# Patient Record
Sex: Female | Born: 1955
Health system: Southern US, Academic
[De-identification: ages and names within clinical notes are randomized; demographics above are authoritative.]

## PROBLEM LIST (undated history)

## (undated) ENCOUNTER — Telehealth

## (undated) ENCOUNTER — Encounter

## (undated) DIAGNOSIS — C801 Malignant (primary) neoplasm, unspecified: Secondary | ICD-10-CM

## (undated) DIAGNOSIS — K219 Gastro-esophageal reflux disease without esophagitis: Secondary | ICD-10-CM

## (undated) DIAGNOSIS — E559 Vitamin D deficiency, unspecified: Secondary | ICD-10-CM

## (undated) DIAGNOSIS — E785 Hyperlipidemia, unspecified: Secondary | ICD-10-CM

## (undated) DIAGNOSIS — K7689 Other specified diseases of liver: Secondary | ICD-10-CM

## (undated) DIAGNOSIS — E538 Deficiency of other specified B group vitamins: Secondary | ICD-10-CM

## (undated) DIAGNOSIS — K760 Fatty (change of) liver, not elsewhere classified: Secondary | ICD-10-CM

## (undated) DIAGNOSIS — K5792 Diverticulitis of intestine, part unspecified, without perforation or abscess without bleeding: Secondary | ICD-10-CM

## (undated) DIAGNOSIS — F32A Depression, unspecified: Secondary | ICD-10-CM

## (undated) DIAGNOSIS — I10 Essential (primary) hypertension: Secondary | ICD-10-CM

## (undated) DIAGNOSIS — F329 Major depressive disorder, single episode, unspecified: Secondary | ICD-10-CM

## (undated) HISTORY — DX: Vitamin D deficiency, unspecified: E55.9

## (undated) HISTORY — DX: Hyperlipidemia, unspecified: E78.5

## (undated) HISTORY — PX: ABDOMINAL HYSTERECTOMY: SHX81

## (undated) HISTORY — DX: Depression, unspecified: F32.A

## (undated) HISTORY — DX: Fatty (change of) liver, not elsewhere classified: K76.0

## (undated) HISTORY — DX: Gastro-esophageal reflux disease without esophagitis: K21.9

## (undated) HISTORY — DX: Deficiency of other specified B group vitamins: E53.8

## (undated) HISTORY — DX: Other specified diseases of liver: K76.89

## (undated) HISTORY — PX: COLON RESECTION: SHX5231

## (undated) HISTORY — DX: Diverticulitis of intestine, part unspecified, without perforation or abscess without bleeding: K57.92

## (undated) HISTORY — DX: Malignant (primary) neoplasm, unspecified: C80.1

## (undated) HISTORY — DX: Essential (primary) hypertension: I10

---
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
## 2016-01-14 DIAGNOSIS — G2581 Restless legs syndrome: Principal | ICD-10-CM

## 2016-01-14 MED ORDER — ROPINIROLE HCL 2 MG PO TABS
2 mg | Freq: Every evening | ORAL | 2 refills | Status: CP
Start: 2016-01-14 — End: 2016-05-24

## 2016-01-14 NOTE — Telephone Encounter
From: Trilby DrummerBetty A Duffey  To: Silvestre MesiJohns, Garri, ARNP  Sent: 01/14/2016 5:49 AM EDT  Subject:  Medication Renewal Request    Original  authorizing provider: Silvestre MesiGarri Johns, ARNP    Tina GaudyBetty  A. Maisie Key would like a refill of the following medications:  rOPINIRole  (REQUIP) 2 MG Tablet Silvestre Mesi[Garri Johns, [name]    [facility]: [name], [name]    Comment:

## 2016-01-26 DIAGNOSIS — I10 Essential (primary) hypertension: Principal | ICD-10-CM

## 2016-01-26 DIAGNOSIS — F330 Major depressive disorder, recurrent, mild: Principal | ICD-10-CM

## 2016-01-26 MED ORDER — HYDROCHLOROTHIAZIDE 12.5 MG PO TABS
2 refills | Status: CP
Start: 2016-01-26 — End: 2016-02-23

## 2016-01-26 MED ORDER — QUETIAPINE FUMARATE 25 MG PO TABS
2 refills | Status: CP
Start: 2016-01-26 — End: 2016-02-23

## 2016-02-23 DIAGNOSIS — F411 Generalized anxiety disorder: Principal | ICD-10-CM

## 2016-02-23 DIAGNOSIS — F330 Major depressive disorder, recurrent, mild: Principal | ICD-10-CM

## 2016-02-23 DIAGNOSIS — I10 Essential (primary) hypertension: Secondary | ICD-10-CM

## 2016-02-23 MED ORDER — DIAZEPAM 2 MG PO TABS
2 mg | Freq: Two times a day (BID) | ORAL | 0 refills | Status: CN | PRN
Start: 2016-02-23 — End: ?

## 2016-02-23 MED ORDER — QUETIAPINE FUMARATE 25 MG PO TABS
25 mg | Freq: Two times a day (BID) | ORAL | 2 refills | Status: CP
Start: 2016-02-23 — End: 2016-03-15

## 2016-02-23 MED ORDER — HYDROCHLOROTHIAZIDE 12.5 MG PO TABS
12.5 mg | Freq: Every day | ORAL | 1 refills | Status: CP
Start: 2016-02-23 — End: 2016-10-24

## 2016-02-23 NOTE — Telephone Encounter
Meds sent in but needs to schedule office visit hasn't been seen since 09/2015.  Thanks,  Arvil ChacoGarri

## 2016-02-23 NOTE — Telephone Encounter
From: Trilby DrummerBetty A Spacek  To: Silvestre MesiJohns, Garri, [name]  Sent: 02/22/2016 8:18 PM EDT  Subject:  Medication Renewal Request    Original  authorizing provider: Silvestre MesiGarri Johns, ARNP    [name] would like a refill of the following medications:  QUEtiapine  (SEROquel) 25 MG Tablet Silvestre Mesi[Garri Johns, [name]    Preferred  [facility]acy: [name], [name]    Comment:      Medication  renewals requested in this message routed to other providers:  diazePAM  (VALIUM) 2 MG Tablet [name], [name]  hydroCHLOROthiazide  (HYDRODIURIL) 12.5 MG Tablet [name], [name]

## 2016-02-23 NOTE — Telephone Encounter
SENT MYCHART MESSAGE.

## 2016-02-23 NOTE — Telephone Encounter
From: Trilby DrummerBetty A Key  To: [name], [name], [name]  Sent: 02/22/2016 8:18 PM EDT  Subject:  Medication Renewal Request    Original  authorizing provider: [name], ARNP    [name] would like a refill of the following medications:  diazePAM  (VALIUM) 2 MG Tablet [name], [name]  hydroCHLOROthiazide  (HYDRODIURIL) 12.5 MG Tablet [name], [name]    Preferred  [facility]acy: [name], [name]    Comment:      Medication  renewals requested in this message routed to other providers:  QUEtiapine  (SEROquel) 25 MG Tablet [name], [name]

## 2016-02-23 NOTE — Telephone Encounter
Has to have appt for valium    HCTZ sent

## 2016-02-23 NOTE — Telephone Encounter
Pt read mychart message

## 2016-02-24 NOTE — Telephone Encounter
Replied in mychart

## 2016-02-24 NOTE — Telephone Encounter
Pt Read message 02/24/16 1:50pm

## 2016-03-15 ENCOUNTER — Ambulatory Visit: Attending: Family | Primary: Nurse Practitioner

## 2016-03-15 DIAGNOSIS — Z8541 Personal history of malignant neoplasm of cervix uteri: Secondary | ICD-10-CM

## 2016-03-15 DIAGNOSIS — G4700 Insomnia, unspecified: Secondary | ICD-10-CM

## 2016-03-15 DIAGNOSIS — G8929 Other chronic pain: Secondary | ICD-10-CM

## 2016-03-15 DIAGNOSIS — F329 Major depressive disorder, single episode, unspecified: Secondary | ICD-10-CM

## 2016-03-15 DIAGNOSIS — F330 Major depressive disorder, recurrent, mild: Secondary | ICD-10-CM

## 2016-03-15 DIAGNOSIS — K449 Diaphragmatic hernia without obstruction or gangrene: Secondary | ICD-10-CM

## 2016-03-15 DIAGNOSIS — D649 Anemia, unspecified: Secondary | ICD-10-CM

## 2016-03-15 DIAGNOSIS — I10 Essential (primary) hypertension: Secondary | ICD-10-CM

## 2016-03-15 DIAGNOSIS — I499 Cardiac arrhythmia, unspecified: Secondary | ICD-10-CM

## 2016-03-15 DIAGNOSIS — R32 Unspecified urinary incontinence: Secondary | ICD-10-CM

## 2016-03-15 DIAGNOSIS — E349 Endocrine disorder, unspecified: Secondary | ICD-10-CM

## 2016-03-15 DIAGNOSIS — F411 Generalized anxiety disorder: Secondary | ICD-10-CM

## 2016-03-15 DIAGNOSIS — K651 Peritoneal abscess: Secondary | ICD-10-CM

## 2016-03-15 DIAGNOSIS — J0111 Acute recurrent frontal sinusitis: Principal | ICD-10-CM

## 2016-03-15 DIAGNOSIS — E785 Hyperlipidemia, unspecified: Secondary | ICD-10-CM

## 2016-03-15 DIAGNOSIS — J449 Chronic obstructive pulmonary disease, unspecified: Secondary | ICD-10-CM

## 2016-03-15 DIAGNOSIS — Z6833 Body mass index (BMI) 33.0-33.9, adult: Secondary | ICD-10-CM

## 2016-03-15 DIAGNOSIS — R238 Other skin changes: Secondary | ICD-10-CM

## 2016-03-15 DIAGNOSIS — F419 Anxiety disorder, unspecified: Secondary | ICD-10-CM

## 2016-03-15 DIAGNOSIS — G4733 Obstructive sleep apnea (adult) (pediatric): Secondary | ICD-10-CM

## 2016-03-15 DIAGNOSIS — N289 Disorder of kidney and ureter, unspecified: Secondary | ICD-10-CM

## 2016-03-15 DIAGNOSIS — K56609 Unspecified intestinal obstruction, unspecified as to partial versus complete obstruction: Secondary | ICD-10-CM

## 2016-03-15 DIAGNOSIS — R0683 Snoring: Secondary | ICD-10-CM

## 2016-03-15 DIAGNOSIS — Z8719 Personal history of other diseases of the digestive system: Secondary | ICD-10-CM

## 2016-03-15 DIAGNOSIS — L98499 Non-pressure chronic ulcer of skin of other sites with unspecified severity: Secondary | ICD-10-CM

## 2016-03-15 MED ORDER — DIAZEPAM 2 MG PO TABS
2 mg | Freq: Two times a day (BID) | ORAL | 2 refills | Status: CP | PRN
Start: 2016-03-15 — End: 2016-09-13

## 2016-03-15 MED ORDER — AMOXICILLIN-POT CLAVULANATE 875-125 MG PO TABS
1 | ORAL_TABLET | Freq: Two times a day (BID) | ORAL | 0 refills | Status: CP
Start: 2016-03-15 — End: ?

## 2016-03-15 MED ORDER — QUETIAPINE FUMARATE 25 MG PO TABS
25 mg | Freq: Two times a day (BID) | ORAL | 2 refills | Status: CP
Start: 2016-03-15 — End: 2016-07-02

## 2016-03-15 NOTE — Progress Notes
?   USPSTF HIV Risk Assessment  05/25/1969   ? DTaP,Tdap,and Td Vaccines (1 - Tdap) 05/25/1973   ? Lipid Profile  09/22/2011   ? Mammogram Discussion  10/01/2011   ? Zoster Vaccine (1) 05/25/2014   ? Basic Metabolic Panel  04/22/2015   ? Preventive Wellness Visit  07/09/2015   ? Influenza Vaccine (1) 03/11/2016   ? Colonoscopy  10/27/2025

## 2016-03-15 NOTE — Progress Notes
Subjective:   Tina Key is a 60 y.o. female being seen today for Anxiety (Med refills)       HPI  Tina Key presents to office requesting medication refills.  C/o sinus pressure, congestion, postnasal drip and reports that symptoms started 2-3 weeks ago. Taking OTC medications without any symptom relief.  Due for labs and annual physical exam.  Requesting medication refills for anxiety. Denies any homicidal or suicidal ideations.  Former smoker.  Past Medical History:   Diagnosis Date   ? Abscess of abdominal cavity    ? Anemia    ? Anxiety    ? Anxiety disorder    ? Bladder incontinence    ? Chronic pain    ? Chronic skin ulcer    ? COPD (chronic obstructive pulmonary disease)    ? Depression    ? Easy bruising    ? Hiatal hernia    ? History of cervical cancer    ? History of diverticulitis of colon    ? Hormone disorder    ? HTN (hypertension)    ? Hyperlipidemia    ? Insomnia    ? Irregular heart beat    ? Kidney disease    ? OSA (obstructive sleep apnea)    ? Small bowel obstruction    ? Snoring disorder      Past Surgical History:   Procedure Laterality Date   ? COLECTOMY       Partial Colectomy; laparoscopic sigmoid colectomy (08/19/11)    ? COLONOSCOPY     ? ESOPHAGOGASTROSCOPY Left 01/01/2013    ESOPHAGOGASTROSCOPY performed by Noel GeroldEid, Amalie F, MD at [facility]   ? HYSTERECTOMY      complete due to cervical cancer      Family History   Problem Relation Age of Onset   ? Heart Disease Other      none   ? High Blood Pressure Mother    ? Depression Mother    ? No Known Problems Father    ? No Known Problems Sister    ? No Known Problems Brother    ? No Known Problems Maternal Grandmother    ? No Known Problems Maternal Grandfather    ? No Known Problems Paternal Grandmother    ? No Known Problems Paternal Grandfather    ? No Known Problems Maternal Aunt    ? No Known Problems Maternal Uncle    ? No Known Problems Paternal Aunt    ? No Known Problems Paternal Uncle      Social History     Social History

## 2016-03-15 NOTE — Progress Notes
Nose: Mucosal edema, rhinorrhea and sinus tenderness present. Right sinus exhibits frontal sinus tenderness. Left sinus exhibits frontal sinus tenderness.   Mouth/Throat: Posterior oropharyngeal erythema present.   Cardiovascular: Normal rate and regular rhythm.    Pulmonary/Chest: Effort normal and breath sounds normal.   Musculoskeletal: Normal range of motion.   Neurological: She is alert and oriented to person, place, and time.   Skin: Skin is warm and dry.   Psychiatric: She has a normal mood and affect. Her behavior is normal. Judgment and thought content normal.   Vitals reviewed.    Assessment:       ICD-10-CM ICD-9-CM    1. Acute recurrent frontal sinusitis J01.11 461.1 amoxicillin-clavulanate (AUGMENTIN) 875-125 MG Tablet   2. 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 Anxiety state F41.1 300.00 diazePAM (VALIUM) 2 MG Tablet   4. BMI 33.0-33.9,adult Z68.33 V85.33      Plan:   Acute sinusitis- Start medications as prescribed and to f/u if symptoms persist/worsen.  Depression/Anxiety- Stable and medications refilled F/u 3 months or as needed.  BMI 33  F/u 2 weeks after labs and is due for annual physical exam.  Orders Placed This Encounter   Medications   ? QUEtiapine (SEROquel) 25 MG Tablet     Sig: Take 1 tablet by mouth 2 times daily.     Dispense:  60 tablet     Refill:  2   ? diazePAM (VALIUM) 2 MG Tablet     Sig: Take 1 tablet by mouth every 12 hours as needed.     Dispense:  60 tablet     Refill:  2   ? amoxicillin-clavulanate (AUGMENTIN) 875-125 MG Tablet     Sig: Take 1 tablet by mouth every 12 hours for 10 days.     Dispense:  20 tablet     Refill:  0     No orders of the following type(s) were placed in this encounter: Procedures      Health Maintenance was reviewed. The patient's HM Topic list was:                                            Health Maintenance   Topic Date Due   ? USPSTF Hepatitis C Screening  07-25-1955

## 2016-03-15 NOTE — Progress Notes
?   Marital status: Widowed     Spouse name: N/A   ? Number of children: N/A   ? Years of education: N/A     Occupational History   ? Not on file.     Social History Main Topics   ? Smoking status: Former Smoker     Packs/day: 0.25     Years: 30.00     Types: Cigarettes     Start date: 08/09/1982   ? Smokeless tobacco: Never Used   ? Alcohol use Not on file      Comment: 2cans of beer a week   ? Drug use: No   ? Sexual activity: No     Other Topics Concern   ? Not on file     Social History Narrative     Current Outpatient Prescriptions on File Prior to Visit   Medication Sig   ? atorvastatin (LIPITOR) 20 MG Tablet Take 1 tablet by mouth daily.   ? [DISCONTINUED] diazePAM (VALIUM) 2 MG Tablet Take 1 tablet by mouth every 12 hours as needed.   ? fesoterodine fumarate (TOVIAZ) 4 MG Tablet Extended Release 24 Hour tablet Take 4 mg by mouth daily.   ? [DISCONTINUED] GAVILYTE-N WITH FLAVOR PACK 420 G Solution Reconstituted    ? hydroCHLOROthiazide (HYDRODIURIL) 12.5 MG Tablet Take 1 tablet by mouth daily.   ? metoprolol tartrate (LOPRESSOR) 25 MG Tablet Take 1 tablet by mouth 2 times daily.   ? polyethylene glycol (GLYCOLAX) Powder Take 17 g by mouth daily. Mix 1 capful (17 gm) in 8 ounces of water, juice or tea and drink daily.   ? [DISCONTINUED] QUEtiapine (SEROquel) 25 MG Tablet Take 1 tablet by mouth 2 times daily.   ? rOPINIRole (REQUIP) 2 MG Tablet Take 1 tablet by mouth nightly at bedtime.   ? [DISCONTINUED] levocetirizine (XYZAL) 5 MG Tablet Take 1 Tablet by mouth nightly at bedtime.   ? mupirocin (BACTROBAN) 2 % Ointment Apply topically 3 times daily for 7-10 days   ? [DISCONTINUED] omeprazole (PriLOSEC) 40 MG Capsule Delayed Release Take 1 capsule by mouth daily.     No current facility-administered medications on file prior to visit.      Allergies   Allergen Reactions   ? Seasonal Allergies Other (See Comments)     Not listed    ? Soma [Carisoprodol] Other (See Comments)     " makes me drunk"

## 2016-03-15 NOTE — Progress Notes
Review of Systems  Review of Systems   HENT: Positive for postnasal drip, rhinorrhea and sinus pain.    Respiratory: Positive for cough.    Psychiatric/Behavioral: Negative for self-injury and suicidal ideas. The patient is nervous/anxious.          Objective:        VITAL SIGNS (all recorded)      Clinic Vitals       03/15/16 1509             Amb Encounter Vitals    Weight 80.5 kg (177 lb 6.4 oz)    -HS at 03/15/16 1514       Height 1.549 m (5\' 1" )    -HS at 03/15/16 1509       BMI (Calculated) 33.59    -HS at 03/15/16 1514       BSA (Calculated - sq m) 1.86    -HS at 03/15/16 1514       BP 138/90   manual    -HS at 03/15/16 1520       BP Location Left upper arm    -HS at 03/15/16 1509       Position Sitting    -HS at 03/15/16 1509       Pulse 75    -HS at 03/15/16 1519       Pulse Source Monitor    -HS at 03/15/16 1509       Pulse Quality Normal    -HS at 03/15/16 1514       Resp 16    -HS at 03/15/16 1514       Respiration Quality Normal    -HS at 03/15/16 1514       Temp 36.2 ?C (97.2 ?F)    -HS at 03/15/16 1519       Temperature Source Oral    -HS at 03/15/16 1509       O2 Saturation 11 %    -HS at 03/15/16 1519       Pain Score EIGHT    -HS at 03/15/16 1514       Last Menstrual Period --   hysterectomy    -HS at 03/15/16 1509       Education/Communication Barriers?    Learning/Communication Barriers? No    -HS at 03/15/16 1514       Fall Risk Assessment    Had recent fall / Last 6 months? No recent fall    -HS at 03/15/16 1514       Does patient have a fear of falling? No    -HS at 03/15/16 1514         User Key  (r) = Recorded By, (t) = Taken By, (c) = Cosigned By    Initials Name Effective Dates    HS Katrinka BlazingSmith, [place], [place] 10/13/15 -         Physical Exam   Constitutional: She is oriented to person, place, and time. She appears well-developed and well-nourished. No distress.   HENT:   Head: Normocephalic and atraumatic.   Right Ear: Tympanic membrane is injected.   Left Ear: Tympanic membrane is injected.

## 2016-03-16 NOTE — Progress Notes
No teaching statement needed

## 2016-04-18 DIAGNOSIS — I10 Essential (primary) hypertension: Principal | ICD-10-CM

## 2016-04-18 MED ORDER — METOPROLOL TARTRATE 25 MG PO TABS
25 mg | Freq: Two times a day (BID) | ORAL | 2 refills | Status: CP
Start: 2016-04-18 — End: 2016-08-08

## 2016-04-18 NOTE — Telephone Encounter
From: Trilby DrummerBetty A Key  To: [name], [name], [name]  Sent: 04/17/2016 4:09 PM EDT  Subject:  Medication Renewal Request    Original  authorizing provider: [name], ARNP    [name] would like a refill of the following medications:  metoprolol  tartrate (LOPRESSOR) 25 MG Tablet [name], [name]    [facility]: [name], [name]    Comment:

## 2016-05-23 DIAGNOSIS — G2581 Restless legs syndrome: Principal | ICD-10-CM

## 2016-05-23 MED ORDER — ROPINIROLE HCL 2 MG PO TABS
2 mg | Freq: Every evening | ORAL | 2 refills | Status: CP
Start: 2016-05-23 — End: 2016-09-13

## 2016-05-23 NOTE — Telephone Encounter
From: Trilby DrummerBetty A Surrette  To: Silvestre MesiJohns, Garri, [name]  Sent: 05/20/2016 6:47 PM EST  Subject:  Medication Renewal Request    Original  authorizing provider: Silvestre MesiGarri Johns, ARNP    [name] would like a refill of the following medications:  rOPINIRole  (REQUIP) 2 MG Tablet Silvestre Mesi[Garri Johns, [name]    [facility]acy: [name], [name]    Comment:

## 2016-07-01 DIAGNOSIS — F330 Major depressive disorder, recurrent, mild: Principal | ICD-10-CM

## 2016-07-01 DIAGNOSIS — E785 Hyperlipidemia, unspecified: Principal | ICD-10-CM

## 2016-07-01 MED ORDER — QUETIAPINE FUMARATE 25 MG PO TABS
25 mg | Freq: Two times a day (BID) | ORAL | 2 refills | Status: CP
Start: 2016-07-01 — End: 2016-12-06

## 2016-07-01 MED ORDER — ATORVASTATIN CALCIUM 20 MG PO TABS
20 mg | Freq: Every day | ORAL | 2 refills | Status: CP
Start: 2016-07-01 — End: 2016-09-13

## 2016-07-01 NOTE — Telephone Encounter
From: Tina Key  To: [name], [name], [name]  Sent: 07/01/2016 4:49 PM EST  Subject:  Medication Renewal Request    Original  authorizing provider: [name], ARNP    [name] would like a refill of the following medications:  atorvastatin  (LIPITOR) 20 MG Tablet [name], [name]    Preferred  [facility]acy: [name], [name]    Comment:      Medication  renewals requested in this message routed to other providers:  QUEtiapine  (SEROquel) 25 MG Tablet [name], [name]

## 2016-07-01 NOTE — Telephone Encounter
From: Trilby DrummerBetty A Key  To: Silvestre MesiJohns, Garri, [name]  Sent: 07/01/2016 4:49 PM EST  Subject:  Medication Renewal Request    Original  authorizing provider: Silvestre MesiGarri Johns, ARNP    [name] would like a refill of the following medications:  QUEtiapine  (SEROquel) 25 MG Tablet Silvestre Mesi[Garri Johns, [name]    Preferred  [facility]acy: [name], [name]    Comment:      Medication  renewals requested in this message routed to other providers:  atorvastatin  (LIPITOR) 20 MG Tablet [name], [name]

## 2016-08-08 DIAGNOSIS — I10 Essential (primary) hypertension: Principal | ICD-10-CM

## 2016-08-08 MED ORDER — METOPROLOL TARTRATE 25 MG PO TABS
25 mg | Freq: Two times a day (BID) | ORAL | 0 refills | Status: CP
Start: 2016-08-08 — End: 2016-09-13

## 2016-08-08 NOTE — Telephone Encounter
30 days sent in but needs to schedule f/u visit.  Thanks,  Arvil ChacoGarri

## 2016-08-08 NOTE — Telephone Encounter
From: Trilby DrummerBetty A Dubey  To: [name], [name], [name]  Sent: 08/05/2016 8:10 PM EST  Subject:  Medication Renewal Request    Original  authorizing provider: [name], ARNP    [name] would like a refill of the following medications:  metoprolol  tartrate (LOPRESSOR) 25 MG Tablet [name], [name]    Preferred  [facility]acy: [name], [name]    Comment:

## 2016-08-09 NOTE — Telephone Encounter
Last read by Trilby DrummerBetty A Awadallah at 8:54 AM on 08/09/2016

## 2016-08-09 NOTE — Telephone Encounter
Replied mychart

## 2016-09-12 ENCOUNTER — Ambulatory Visit: Attending: Nurse Practitioner | Primary: Nurse Practitioner

## 2016-09-12 DIAGNOSIS — M542 Cervicalgia: Secondary | ICD-10-CM

## 2016-09-12 DIAGNOSIS — R5381 Other malaise: Secondary | ICD-10-CM

## 2016-09-12 DIAGNOSIS — G4733 Obstructive sleep apnea (adult) (pediatric): Secondary | ICD-10-CM

## 2016-09-12 DIAGNOSIS — Z6833 Body mass index (BMI) 33.0-33.9, adult: Secondary | ICD-10-CM

## 2016-09-12 DIAGNOSIS — E349 Endocrine disorder, unspecified: Secondary | ICD-10-CM

## 2016-09-12 DIAGNOSIS — I10 Essential (primary) hypertension: Principal | ICD-10-CM

## 2016-09-12 DIAGNOSIS — R0683 Snoring: Secondary | ICD-10-CM

## 2016-09-12 DIAGNOSIS — Z9109 Other allergy status, other than to drugs and biological substances: Secondary | ICD-10-CM

## 2016-09-12 DIAGNOSIS — E782 Mixed hyperlipidemia: Secondary | ICD-10-CM

## 2016-09-12 DIAGNOSIS — R238 Other skin changes: Secondary | ICD-10-CM

## 2016-09-12 DIAGNOSIS — F411 Generalized anxiety disorder: Principal | ICD-10-CM

## 2016-09-12 DIAGNOSIS — R5383 Other fatigue: Secondary | ICD-10-CM

## 2016-09-12 DIAGNOSIS — K449 Diaphragmatic hernia without obstruction or gangrene: Secondary | ICD-10-CM

## 2016-09-12 DIAGNOSIS — E785 Hyperlipidemia, unspecified: Secondary | ICD-10-CM

## 2016-09-12 DIAGNOSIS — R32 Unspecified urinary incontinence: Secondary | ICD-10-CM

## 2016-09-12 DIAGNOSIS — G2581 Restless legs syndrome: Secondary | ICD-10-CM

## 2016-09-12 DIAGNOSIS — I499 Cardiac arrhythmia, unspecified: Secondary | ICD-10-CM

## 2016-09-12 DIAGNOSIS — J449 Chronic obstructive pulmonary disease, unspecified: Secondary | ICD-10-CM

## 2016-09-12 DIAGNOSIS — D649 Anemia, unspecified: Secondary | ICD-10-CM

## 2016-09-12 DIAGNOSIS — Z8719 Personal history of other diseases of the digestive system: Secondary | ICD-10-CM

## 2016-09-12 DIAGNOSIS — F419 Anxiety disorder, unspecified: Secondary | ICD-10-CM

## 2016-09-12 DIAGNOSIS — G4700 Insomnia, unspecified: Secondary | ICD-10-CM

## 2016-09-12 DIAGNOSIS — K56609 Unspecified intestinal obstruction, unspecified as to partial versus complete obstruction: Secondary | ICD-10-CM

## 2016-09-12 DIAGNOSIS — Z8541 Personal history of malignant neoplasm of cervix uteri: Secondary | ICD-10-CM

## 2016-09-12 DIAGNOSIS — M62838 Other muscle spasm: Secondary | ICD-10-CM

## 2016-09-12 DIAGNOSIS — F329 Major depressive disorder, single episode, unspecified: Secondary | ICD-10-CM

## 2016-09-12 DIAGNOSIS — N289 Disorder of kidney and ureter, unspecified: Secondary | ICD-10-CM

## 2016-09-12 DIAGNOSIS — K651 Peritoneal abscess: Secondary | ICD-10-CM

## 2016-09-12 DIAGNOSIS — L98499 Non-pressure chronic ulcer of skin of other sites with unspecified severity: Secondary | ICD-10-CM

## 2016-09-12 DIAGNOSIS — G8929 Other chronic pain: Secondary | ICD-10-CM

## 2016-09-12 MED ORDER — DIAZEPAM 2 MG PO TABS
2 mg | Freq: Every day | ORAL | 0 refills | Status: CP | PRN
Start: 2016-09-12 — End: 2016-12-06

## 2016-09-12 MED ORDER — METOPROLOL TARTRATE 25 MG PO TABS
25 mg | Freq: Two times a day (BID) | ORAL | 1 refills | Status: CP
Start: 2016-09-12 — End: 2017-03-22

## 2016-09-12 MED ORDER — ROPINIROLE HCL 2 MG PO TABS
2 mg | Freq: Every evening | ORAL | 1 refills | Status: CP
Start: 2016-09-12 — End: 2016-09-19

## 2016-09-12 MED ORDER — ATORVASTATIN CALCIUM 20 MG PO TABS
20 mg | Freq: Every evening | ORAL | 1 refills | Status: CP
Start: 2016-09-12 — End: 2017-03-31

## 2016-09-12 MED ORDER — DICLOFENAC SODIUM 1 % TD GEL
1 refills | Status: CP
Start: 2016-09-12 — End: 2018-03-06

## 2016-09-12 MED ORDER — MONTELUKAST SODIUM 10 MG PO TABS
10 mg | Freq: Every evening | ORAL | 1 refills | Status: CP
Start: 2016-09-12 — End: 2017-03-31

## 2016-09-12 NOTE — Progress Notes
5. Muscle spasm M62.838 728.85 diclofenac (VOLTAREN GEL) 1 % TD Gel   6. RLS (restless legs syndrome) G25.81 333.94 rOPINIRole (REQUIP) 2 MG PO Tablet   7. Mixed hyperlipidemia E78.2 272.2 atorvastatin (LIPITOR) 20 MG PO Tablet   8. Malaise and fatigue R53.81 780.79     R53.83     9. BMI 33.0-33.9,adult Z68.33 V85.33      Plan:     Orders Placed This Encounter   Medications   ? diazePAM (VALIUM) 2 MG PO Tablet     Sig: Take 1 tablet by mouth daily as needed for anxiety or sleep.     Dispense:  30 tablet     Refill:  0   ? montelukast (SINGULAIR) 10 MG PO Tablet     Sig: Take 1 tablet by mouth nightly at bedtime.     Dispense:  90 tablet     Refill:  1   ? diclofenac (VOLTAREN GEL) 1 % TD Gel     Sig: Apply to affected area     Dispense:  100 g     Refill:  1   ? rOPINIRole (REQUIP) 2 MG PO Tablet     Sig: Take 1 tablet by mouth nightly at bedtime.     Dispense:  90 tablet     Refill:  1   ? metoprolol tartrate (LOPRESSOR) 25 MG PO Tablet     Sig: Take 1 tablet by mouth 2 times daily for 90 days.     Dispense:  180 tablet     Refill:  1   ? atorvastatin (LIPITOR) 20 MG PO Tablet     Sig: Take 1 tablet by mouth nightly at bedtime.     Dispense:  90 tablet     Refill:  1     Orders Placed This Encounter   Procedures   ? XR C Spine Min 4 Views     -Cervicalgia: xray ordered and added rx topical-will contact once results obtained  -HTN: stable. Rx renewed as above  -Dyslipidemia: Due for BW-will order at physical; rx renewed as above  -RLs: Stable and at goal.rx renewed as above  -anxiety: Stable and at goal. Rx renewed as above. Decreased to once daily as that is how she is taking. Pt for f/u in 3 months  -Allergies: not at goal. Rx changed to singulair-will reeval at physical    -Pt due for physical  Health Maintenance was reviewed. The patient's HM Topic list was:                                            Health Maintenance   Topic Date Due   ? USPSTF Hepatitis C Screening  April 19, 1956

## 2016-09-12 NOTE — Progress Notes
Learning/Communication Barriers? No    -HS at 09/12/16 1609       Fall Risk Assessment    Had recent fall / Last 6 months? No recent fall    -HS at 09/12/16 1609       Does patient have a fear of falling? No    -HS at 09/12/16 1609         User Key  (r) = Recorded By, (t) = Taken By, (c) = Cosigned By    Initials Name Effective Dates    HS Katrinka BlazingSmith, [place], [place] 10/13/15 -         Physical Exam   Constitutional: She is oriented to person, place, and time. She appears well-developed and well-nourished. No distress.   HENT:   Head: Normocephalic and atraumatic.   Nose: Mucosal edema and rhinorrhea present.   Mouth/Throat: Uvula is midline. Posterior oropharyngeal erythema present.   cobblestoning   Eyes: Conjunctivae and EOM are normal. Pupils are equal, round, and reactive to light. Right eye exhibits no discharge. Left eye exhibits no discharge.   Wearing eye glasses   Neck: No tracheal deviation present.   Limited ROM when turning head L to R  No issues with flexion/extension   Cardiovascular: Normal rate and normal heart sounds.    Pulmonary/Chest: Effort normal. No respiratory distress.   Abdominal: Soft. She exhibits no distension.   Genitourinary:   Genitourinary Comments: Deferred at current   Musculoskeletal: Normal range of motion.   Neurological: She is alert and oriented to person, place, and time.   Skin: Skin is warm and dry. No rash noted. No erythema.   Psychiatric: She has a normal mood and affect. Her behavior is normal. Judgment and thought content normal.   Vitals reviewed.    Assessment:       ICD-10-CM ICD-9-CM    1. Essential hypertension with goal blood pressure less than 140/90 I10 401.9 metoprolol tartrate (LOPRESSOR) 25 MG PO Tablet   2. Anxiety state F41.1 300.00 diazePAM (VALIUM) 2 MG PO Tablet   3. Environmental allergies Z91.09 V15.09 montelukast (SINGULAIR) 10 MG PO Tablet   4. Cervicalgia M54.2 723.1 XR C Spine Min 4 Views      diclofenac (VOLTAREN GEL) 1 % TD Gel

## 2016-09-12 NOTE — Progress Notes
?   HYSTERECTOMY      complete due to cervical cancer      Family History   Problem Relation Age of Onset   ? Heart Disease Other      none   ? High Blood Pressure Mother    ? Depression Mother    ? No Known Problems Father    ? No Known Problems Sister    ? No Known Problems Brother    ? No Known Problems Maternal Grandmother    ? No Known Problems Maternal Grandfather    ? No Known Problems Paternal Grandmother    ? No Known Problems Paternal Grandfather    ? No Known Problems Maternal Aunt    ? No Known Problems Maternal Uncle    ? No Known Problems Paternal Aunt    ? No Known Problems Paternal Uncle      Social History     Social History   ? Marital status: Widowed     Spouse name: N/A   ? Number of children: N/A   ? Years of education: N/A     Occupational History   ? Not on file.     Social History Main Topics   ? Smoking status: Former Smoker     Packs/day: 0.25     Years: 30.00     Types: Cigarettes     Start date: 08/09/1982   ? Smokeless tobacco: Never Used   ? Alcohol use Not on file      Comment: 2cans of beer a week   ? Drug use: No   ? Sexual activity: No     Other Topics Concern   ? Not on file     Social History Narrative     Current Outpatient Prescriptions on File Prior to Visit   Medication Sig   ? [DISCONTINUED] atorvastatin (LIPITOR) 20 MG PO Tablet Take 1 tablet by mouth daily.   ? [DISCONTINUED] diazePAM (VALIUM) 2 MG Tablet Take 1 tablet by mouth every 12 hours as needed.   ? fesoterodine fumarate (TOVIAZ) 4 MG Tablet Extended Release 24 Hour tablet Take 4 mg by mouth daily.   ? hydroCHLOROthiazide (HYDRODIURIL) 12.5 MG Tablet Take 1 tablet by mouth daily.   ? [DISCONTINUED] metoprolol tartrate (LOPRESSOR) 25 MG PO Tablet Take 1 tablet by mouth 2 times daily.   ? mupirocin (BACTROBAN) 2 % Ointment Apply topically 3 times daily for 7-10 days   ? polyethylene glycol (GLYCOLAX) Powder Take 17 g by mouth daily. Mix 1 capful (17 gm) in 8 ounces of water, juice or tea and drink daily.

## 2016-09-12 NOTE — Progress Notes
?   USPSTF HIV Risk Assessment  05/25/1969   ? DTaP,Tdap,and Td Vaccines (1 - Tdap) 05/25/1973   ? Chest CT / LDCT  05/25/2009   ? Lipid Profile  09/22/2011   ? Breast Cancer Screening  09/30/2012   ? Zoster Vaccine (1) 05/25/2014   ? Basic Metabolic Panel  04/22/2015   ? Preventive Wellness Visit  07/09/2015   ? Influenza Vaccine (1) 03/11/2016   ? Colon Cancer Screening  05/16/2022

## 2016-09-12 NOTE — Progress Notes
?   QUEtiapine (SEROquel) 25 MG PO Tablet Take 1 tablet by mouth 2 times daily.   ? [DISCONTINUED] rOPINIRole (REQUIP) 2 MG Tablet Take 1 tablet by mouth nightly at bedtime.     No current facility-administered medications on file prior to visit.      Allergies   Allergen Reactions   ? Seasonal Allergies Other (See Comments)     Not listed    ? Soma [Carisoprodol] Other (See Comments)     " makes me drunk"       Review of Systems  Review of Systems   Constitutional: Positive for fatigue. Negative for fever.   HENT: Positive for postnasal drip and rhinorrhea.    Respiratory: Positive for cough.    Musculoskeletal: Positive for neck pain.   Neurological:        +RLS at goal with increase med at last visit   Psychiatric/Behavioral: Negative for self-injury and suicidal ideas. The patient is not nervous/anxious.         Stable with medications   All other systems reviewed and are negative.    Objective:        VITAL SIGNS (all recorded)      Clinic Vitals       09/12/16 1609             Amb Encounter Vitals    Weight 79.7 kg (175 lb 9.6 oz)    -HS at 09/12/16 1615       Height 1.549 m (5\' 1" )    -HS at 09/12/16 1609       BMI (Calculated) 33.25    -HS at 09/12/16 1615       BSA (Calculated - sq m) 1.85    -HS at 09/12/16 1615       BP 116/81    -HS at 09/12/16 1615       BP Location Left upper arm    -HS at 09/12/16 1609       Position Sitting    -HS at 09/12/16 1609       Pulse 91    -HS at 09/12/16 1615       Pulse Source Monitor    -HS at 09/12/16 1609       Pulse Quality Normal    -HS at 09/12/16 1615       Resp 16    -HS at 09/12/16 1615       Respiration Quality Normal    -HS at 09/12/16 1615       Temp 36.7 ?C (98.1 ?F)    -HS at 09/12/16 1615       Temperature Source Oral    -HS at 09/12/16 1609       O2 Saturation 96 %    -HS at 09/12/16 1615       Pain Score SEVEN    -HS at 09/12/16 1615       Last Menstrual Period --   hysterectomy    -HS at 09/12/16 1609       Education/Communication Barriers?

## 2016-09-12 NOTE — Progress Notes
Subjective:   Trilby DrummerBetty A Besancon is a 61 y.o. female being seen today for Restless Legs (Med refills)       HPI  -Maurine MinisterBetty Boedecker presents to office for general f/u and medication refills.  -C/o sinus pressure, congestion, postnasal drip and reports that symptoms started 2-3 weeks ago. Taking OTC allegra with no relief-pt would like to discuss adjustment. Discussion followed  -Pt also c/o neck pain that has been ongoing for a few weeks-pt reports she tried ITC IBU but, had stomach issues as she was taking on an empty stomach and stopped. PT reports she can feel "knots" on her R side of neck and has limitation of ROM when turning side to side. Discussion followed  -PT also reports improvement with RLS symptoms since increasing restoril-requesting refills. Discussion followed  -Pt also with anxiety and requesting refills of valium-currently written for BID but, only taking once daily. PT denies any adverse SEs to medications. Discussion followed  -Due for labs and annual physical exam.  Former smoker.  -Pt denies any homicidal/suicidal ideations    -Pt denies any other acute complaints  Past Medical History:   Diagnosis Date   ? Abscess of abdominal cavity    ? Anemia    ? Anxiety    ? Anxiety disorder    ? Bladder incontinence    ? Chronic pain    ? Chronic skin ulcer    ? COPD (chronic obstructive pulmonary disease)    ? Depression    ? Easy bruising    ? Hiatal hernia    ? History of cervical cancer    ? History of diverticulitis of colon    ? Hormone disorder    ? HTN (hypertension)    ? Hyperlipidemia    ? Insomnia    ? Irregular heart beat    ? Kidney disease    ? OSA (obstructive sleep apnea)    ? Small bowel obstruction    ? Snoring disorder      Past Surgical History:   Procedure Laterality Date   ? COLECTOMY       Partial Colectomy; laparoscopic sigmoid colectomy (08/19/11)    ? COLONOSCOPY     ? ESOPHAGOGASTROSCOPY Left 01/01/2013    ESOPHAGOGASTROSCOPY performed by Noel GeroldEid, Amalie F, [facility]

## 2016-09-13 NOTE — Progress Notes
No teaching statement needed

## 2016-09-19 DIAGNOSIS — G2581 Restless legs syndrome: Principal | ICD-10-CM

## 2016-09-19 MED ORDER — ROPINIROLE HCL 2 MG PO TABS
2 mg | Freq: Every evening | ORAL | 1 refills | Status: CP
Start: 2016-09-19 — End: 2017-04-26

## 2016-09-19 NOTE — Telephone Encounter
From: Trilby DrummerBetty A Valiente  To: [name], [name], [name]  Sent: 09/17/2016 10:54 AM EST  Subject:  Medication Renewal Request    Original  authorizing provider: [name], ARNP    [name] would like a refill of the following medications:  rOPINIRole  (REQUIP) 2 MG PO Tablet [name], [name]    [facility]  [name], [name]    Comment:

## 2016-10-17 ENCOUNTER — Encounter: Attending: Family | Primary: Nurse Practitioner

## 2016-10-18 ENCOUNTER — Encounter: Attending: Nurse Practitioner | Primary: Nurse Practitioner

## 2016-10-24 DIAGNOSIS — I10 Essential (primary) hypertension: Principal | ICD-10-CM

## 2016-10-24 MED ORDER — HYDROCHLOROTHIAZIDE 12.5 MG PO TABS
12.5 mg | Freq: Every day | ORAL | 0 refills | Status: CP
Start: 2016-10-24 — End: 2017-03-22

## 2016-12-06 ENCOUNTER — Encounter: Attending: Family | Primary: Nurse Practitioner

## 2016-12-06 DIAGNOSIS — F411 Generalized anxiety disorder: Secondary | ICD-10-CM

## 2016-12-06 DIAGNOSIS — F330 Major depressive disorder, recurrent, mild: Principal | ICD-10-CM

## 2016-12-06 MED ORDER — DIAZEPAM 2 MG PO TABS
2 mg | Freq: Every day | ORAL | 0 refills | Status: CP | PRN
Start: 2016-12-06 — End: 2017-01-24

## 2016-12-06 MED ORDER — QUETIAPINE FUMARATE 25 MG PO TABS
25 mg | Freq: Two times a day (BID) | ORAL | 2 refills | Status: CP
Start: 2016-12-06 — End: 2017-04-15

## 2016-12-06 NOTE — Telephone Encounter
Sent pt mychart message

## 2016-12-06 NOTE — Telephone Encounter
Rx printed.  Thanks,  Garri

## 2016-12-07 NOTE — Telephone Encounter
Sent pt mychart message that RX is ready for pick up.

## 2016-12-19 ENCOUNTER — Ambulatory Visit: Attending: Family | Primary: Nurse Practitioner

## 2016-12-19 DIAGNOSIS — L98499 Non-pressure chronic ulcer of skin of other sites with unspecified severity: Secondary | ICD-10-CM

## 2016-12-19 DIAGNOSIS — K56609 Unspecified intestinal obstruction, unspecified as to partial versus complete obstruction: Secondary | ICD-10-CM

## 2016-12-19 DIAGNOSIS — F419 Anxiety disorder, unspecified: Secondary | ICD-10-CM

## 2016-12-19 DIAGNOSIS — E349 Endocrine disorder, unspecified: Secondary | ICD-10-CM

## 2016-12-19 DIAGNOSIS — I10 Essential (primary) hypertension: Principal | ICD-10-CM

## 2016-12-19 DIAGNOSIS — G8929 Other chronic pain: Secondary | ICD-10-CM

## 2016-12-19 DIAGNOSIS — K651 Peritoneal abscess: Secondary | ICD-10-CM

## 2016-12-19 DIAGNOSIS — I499 Cardiac arrhythmia, unspecified: Secondary | ICD-10-CM

## 2016-12-19 DIAGNOSIS — Z8719 Personal history of other diseases of the digestive system: Secondary | ICD-10-CM

## 2016-12-19 DIAGNOSIS — E785 Hyperlipidemia, unspecified: Secondary | ICD-10-CM

## 2016-12-19 DIAGNOSIS — F411 Generalized anxiety disorder: Secondary | ICD-10-CM

## 2016-12-19 DIAGNOSIS — B359 Dermatophytosis, unspecified: Secondary | ICD-10-CM

## 2016-12-19 DIAGNOSIS — R238 Other skin changes: Secondary | ICD-10-CM

## 2016-12-19 DIAGNOSIS — K449 Diaphragmatic hernia without obstruction or gangrene: Secondary | ICD-10-CM

## 2016-12-19 DIAGNOSIS — Z8541 Personal history of malignant neoplasm of cervix uteri: Secondary | ICD-10-CM

## 2016-12-19 DIAGNOSIS — F329 Major depressive disorder, single episode, unspecified: Secondary | ICD-10-CM

## 2016-12-19 DIAGNOSIS — R0683 Snoring: Secondary | ICD-10-CM

## 2016-12-19 DIAGNOSIS — D649 Anemia, unspecified: Secondary | ICD-10-CM

## 2016-12-19 DIAGNOSIS — G4700 Insomnia, unspecified: Secondary | ICD-10-CM

## 2016-12-19 DIAGNOSIS — G4733 Obstructive sleep apnea (adult) (pediatric): Secondary | ICD-10-CM

## 2016-12-19 DIAGNOSIS — N289 Disorder of kidney and ureter, unspecified: Secondary | ICD-10-CM

## 2016-12-19 DIAGNOSIS — J449 Chronic obstructive pulmonary disease, unspecified: Secondary | ICD-10-CM

## 2016-12-19 DIAGNOSIS — R32 Unspecified urinary incontinence: Secondary | ICD-10-CM

## 2016-12-19 DIAGNOSIS — R42 Dizziness and giddiness: Secondary | ICD-10-CM

## 2016-12-19 MED ORDER — KETOCONAZOLE 2 % EX CREA
0 refills | Status: CP
Start: 2016-12-19 — End: 2018-03-06

## 2016-12-19 NOTE — Progress Notes
medical issues.     Past Medical History:   Diagnosis Date   ? Abscess of abdominal cavity    ? Anemia    ? Anxiety    ? Anxiety disorder    ? Bladder incontinence    ? Chronic pain    ? Chronic skin ulcer    ? COPD (chronic obstructive pulmonary disease)    ? Depression    ? Easy bruising    ? Hiatal hernia    ? History of cervical cancer    ? History of diverticulitis of colon    ? Hormone disorder    ? HTN (hypertension)    ? Hyperlipidemia    ? Insomnia    ? Irregular heart beat    ? Kidney disease    ? OSA (obstructive sleep apnea)    ? Small bowel obstruction    ? Snoring disorder      Past Surgical History:   Procedure Laterality Date   ? COLECTOMY       Partial Colectomy; laparoscopic sigmoid colectomy (08/19/11)    ? COLONOSCOPY     ? ESOPHAGOGASTROSCOPY Left 01/01/2013    ESOPHAGOGASTROSCOPY performed by Noel GeroldEid, Amalie F, MD at [facility]   ? HYSTERECTOMY      complete due to cervical cancer      Family History   Problem Relation Age of Onset   ? Heart Disease Other      none   ? High Blood Pressure Mother    ? Depression Mother    ? No Known Problems Father    ? No Known Problems Sister    ? No Known Problems Brother    ? No Known Problems Maternal Grandmother    ? No Known Problems Maternal Grandfather    ? No Known Problems Paternal Grandmother    ? No Known Problems Paternal Grandfather    ? No Known Problems Maternal Aunt    ? No Known Problems Maternal Uncle    ? No Known Problems Paternal Aunt    ? No Known Problems Paternal Uncle      Social History     Social History   ? Marital status: Widowed     Spouse name: N/A   ? Number of children: N/A   ? Years of education: N/A     Occupational History   ? Not on file.     Social History Main Topics   ? Smoking status: Former Smoker     Packs/day: 0.25     Years: 30.00     Types: Cigarettes     Start date: 08/09/1982   ? Smokeless tobacco: Never Used   ? Alcohol use Not on file      Comment: 2cans of beer a week   ? Drug use: No   ? Sexual activity: No

## 2016-12-19 NOTE — Progress Notes
Other Topics Concern   ? Not on file     Social History Narrative   ? No narrative on file     Current Outpatient Prescriptions on File Prior to Visit   Medication Sig   ? atorvastatin (LIPITOR) 20 MG PO Tablet Take 1 tablet by mouth nightly at bedtime.   ? diazePAM (VALIUM) 2 MG PO Tablet Take 1 tablet by mouth daily as needed for anxiety or sleep.   ? diclofenac (VOLTAREN GEL) 1 % TD Gel Apply to affected area   ? [DISCONTINUED] fesoterodine fumarate (TOVIAZ) 4 MG Tablet Extended Release 24 Hour tablet Take 4 mg by mouth daily.   ? hydroCHLOROthiazide (HYDRODIURIL) 12.5 MG PO Tablet Take 1 tablet by mouth daily.   ? montelukast (SINGULAIR) 10 MG PO Tablet Take 1 tablet by mouth nightly at bedtime.   ? [DISCONTINUED] mupirocin (BACTROBAN) 2 % Ointment Apply topically 3 times daily for 7-10 days   ? [DISCONTINUED] polyethylene glycol (GLYCOLAX) Powder Take 17 g by mouth daily. Mix 1 capful (17 gm) in 8 ounces of water, juice or tea and drink daily.   ? QUEtiapine (SEROquel) 25 MG PO Tablet Take 1 tablet by mouth 2 times daily.   ? rOPINIRole (REQUIP) 2 MG PO Tablet Take 1 tablet by mouth nightly at bedtime.   ? metoprolol tartrate (LOPRESSOR) 25 MG PO Tablet Take 1 tablet by mouth 2 times daily for 90 days.     No current facility-administered medications on file prior to visit.      Allergies   Allergen Reactions   ? Seasonal Allergies Other (See Comments)     Not listed    ? Soma [Carisoprodol] Other (See Comments)     " makes me drunk"           Review of Systems  Review of Systems   Constitutional: Negative for activity change, appetite change, chills, diaphoresis, fatigue, fever and unexpected weight change.   HENT: Negative for congestion, dental problem, drooling, ear discharge, ear pain, facial swelling, hearing loss, mouth sores, nosebleeds, postnasal drip, rhinorrhea, sinus pressure, sneezing, sore throat, tinnitus, trouble swallowing and voice change.

## 2016-12-19 NOTE — Progress Notes
Musculoskeletal: Normal range of motion. She exhibits no edema, tenderness or deformity.   Neurological: She is alert and oriented to person, place, and time.   Skin: Skin is warm and dry. Rash noted. She is not diaphoretic. No erythema. No pallor.   Psychiatric: She has a normal mood and affect. Her behavior is normal. Judgment and thought content normal.        Assessment:       ICD-10-CM ICD-9-CM    1. Essential hypertension with goal blood pressure less than 130/80 I10 401.9    2. Dizziness R42 780.4    3. Anxiety state F41.1 300.00    4. Tinea B35.9 110.9 ketoconazole (NIZORAL) 2 % EX Cream          Plan:   The patient was advised of various treatment options and agreed to the following:  1. Pt picked up her valium script from the front desk  2. Start ketoconazole  3. Decrease metoprolol from 25mg  to 12.5mg   4. Non pharm measures reviewed  5. RTC in one week for bp check.       The risks vs benefits of medication and treatment were discussed. Medications were reconciled and refills/prescriptions were given as needed. Safe over the counter medications were discussed.  Goals for self management were discussed  Previous laboratory and ancillary testing was reviewed today.    Written patient education materials were provided today.  Healthy  Lifestyle including the importance of healthy diet and exercise as tolerated.    The patient was reminded of the importance of follow up visits and compliance with prescribed treatment plan. RTC as scheduled or sooner if no improvement or any other complications. Patient was advised to contact our office if they have not heard back from us on any testing that was performed or any required referral or ancillary services. The patient voiced understanding.    Return to office in one month or prn  Orders Placed This Encounter   Medications   ? ketoconazole (NIZORAL) 2 % EX Cream     Sig: Use topically daily.     Dispense:  30 g     Refill:  0

## 2016-12-19 NOTE — Progress Notes
BP 99/58    -HS at 12/19/16 1158       BP Location Left upper arm    -HS at 12/19/16 1156       Position Sitting    -HS at 12/19/16 1156       Pulse 58    -HS at 12/19/16 1158       Pulse Source Monitor    -HS at 12/19/16 1156       Pulse Quality Normal    -HS at 12/19/16 1156       Resp 16    -HS at 12/19/16 1156       Respiration Quality Normal    -HS at 12/19/16 1156       Temp 36.3 ?C (97.4 ?F)    -HS at 12/19/16 1156       O2 Saturation 95 %    -HS at 12/19/16 1156       Pain Score EIGHT    -HS at 12/19/16 1156       Location bilateral hands     -HS at 12/19/16 1156       Last Menstrual Period ?   hysterectomy    -HS at 12/19/16 1156          Education/Communication Barriers?    Learning/Communication Barriers? No    -HS at 12/19/16 1156          Fall Risk Assessment    Had recent fall / Last 6 months? No recent fall    -HS at 12/19/16 1156       Does patient have a fear of falling? No    -HS at 12/19/16 1156         User Key  (r) = Recorded By, (t) = Taken By, (c) = Cosigned By    Initials Name Effective Dates    HS Katrinka BlazingSmith, [place], [place] 10/13/15 -         Physical Exam   Constitutional: She is oriented to person, place, and time. She appears well-developed and well-nourished. No distress.   HENT:   Head: Normocephalic and atraumatic.   Right Ear: External ear normal.   Left Ear: External ear normal.   Nose: Nose normal.   Mouth/Throat: Oropharynx is clear and moist. No oropharyngeal exudate.   Eyes: Conjunctivae and EOM are normal. Right eye exhibits no discharge. Left eye exhibits no discharge.   Neck: Normal range of motion.   Cardiovascular: Normal rate, regular rhythm, normal heart sounds and intact distal pulses.  Exam reveals no gallop and no friction rub.    No murmur heard.  Pulmonary/Chest: Effort normal and breath sounds normal. No respiratory distress. She has no wheezes. She has no rales. She exhibits no tenderness.   Abdominal: Soft. Bowel sounds are normal.

## 2016-12-19 NOTE — Progress Notes
Subjective:   Trilby DrummerBetty A Lesmeister is a 61 y.o. female being seen today for Dizziness; Hypertension; and Anxiety    The patient is here today for follow up and management of multiple chronic medical conditions listed on the problem list. The patient is currently prescribed the medications listed in the medications list. Previously recommended goals for self management and compliance were reviewed. Pain level assessed. Health issues since the last visit were reviewed. Current medical conditions are stable except as listed below.    Dizziness   This is a new problem. The current episode started 1 to 4 weeks ago. Associated symptoms include a rash. Pertinent negatives include no abdominal pain, arthralgias, chest pain, chills, congestion, coughing, diaphoresis, fatigue, fever, headaches, joint swelling, myalgias, nausea, neck pain, numbness, sore throat, vomiting or weakness. She has tried nothing for the symptoms.   Hypertension   This is a chronic problem. The current episode started more than 1 year ago. The problem is controlled. Pertinent negatives include no chest pain, headaches, neck pain, palpitations or shortness of breath. The current treatment provides significant improvement.   Anxiety   Presents for follow-up visit. Symptoms include dizziness. Patient reports no chest pain, confusion, decreased concentration, nausea, nervous/anxious behavior, palpitations, shortness of breath or suicidal ideas. The quality of sleep is good.     Her past medical history is significant for anxiety/panic attacks. Compliance with medications is 76-100%.   Rash   This is a new problem. The current episode started 1 to 4 weeks ago. Location: left hand. Rash characteristics: tinea. Pertinent negatives include no congestion, cough, diarrhea, eye pain, fatigue, fever, rhinorrhea, shortness of breath, sore throat or vomiting. The treatment provided no relief.   Pt daughter in law and son are assisting with appt, pt is not sure of

## 2016-12-19 NOTE — Progress Notes
No orders of the following type(s) were placed in this encounter: Procedures      Health Maintenance was reviewed. The patient's HM Topic list was:                                            Health Maintenance   Topic Date Due   ? USPSTF Hepatitis C Screening  Jun 22, 1956   ? USPSTF HIV Risk Assessment  05/25/1969   ? DTaP,Tdap,and Td Vaccines (1 - Tdap) 05/25/1973   ? Zoster Vaccine (1 of 2) 05/25/2004   ? Chest CT / LDCT  05/25/2009   ? Lipid Profile  09/22/2011   ? Breast Cancer Screening  09/30/2012   ? Basic Metabolic Panel  04/22/2015   ? Preventive Wellness Visit  07/09/2015   ? Influenza Vaccine (Season Ended) 03/11/2017   ? Colon Cancer Screening  05/16/2022

## 2016-12-19 NOTE — Progress Notes
Eyes: Negative for photophobia, pain, discharge, redness, itching and visual disturbance.   Respiratory: Negative for apnea, cough, choking, chest tightness, shortness of breath, wheezing and stridor.    Cardiovascular: Negative for chest pain, palpitations and leg swelling.   Gastrointestinal: Negative for abdominal distention, abdominal pain, anal bleeding, blood in stool, constipation, diarrhea, nausea, rectal pain and vomiting.   Endocrine: Negative for cold intolerance, heat intolerance, polydipsia, polyphagia and polyuria.   Genitourinary: Negative for decreased urine volume, difficulty urinating, dyspareunia, dysuria, enuresis, flank pain, frequency, genital sores, hematuria, menstrual problem, pelvic pain, urgency, vaginal bleeding, vaginal discharge and vaginal pain.   Musculoskeletal: Negative for arthralgias, back pain, gait problem, joint swelling, myalgias, neck pain and neck stiffness.   Skin: Positive for rash. Negative for color change, pallor and wound.   Allergic/Immunologic: Negative for environmental allergies, food allergies and immunocompromised state.   Neurological: Positive for dizziness. Negative for tremors, seizures, syncope, facial asymmetry, speech difficulty, weakness, light-headedness, numbness and headaches.   Hematological: Negative for adenopathy. Does not bruise/bleed easily.   Psychiatric/Behavioral: Negative for agitation, behavioral problems, confusion, decreased concentration, dysphoric mood, hallucinations, self-injury, sleep disturbance and suicidal ideas. The patient is not nervous/anxious and is not hyperactive.            Objective:        VITAL SIGNS (all recorded)      Clinic Vitals     Row Name 12/19/16 1155                Amb Encounter Vitals    Weight 80 kg (176 lb 6.4 oz)    -HS at 12/19/16 1156       Height 1.549 m (5\' 1" )    -HS at 12/19/16 1156       BMI (Calculated) 33.4    -HS at 12/19/16 1156       BSA (Calculated - sq m) 1.86    -HS at 12/19/16 1156

## 2016-12-20 NOTE — Progress Notes
No teaching statement needed

## 2016-12-26 ENCOUNTER — Ambulatory Visit: Attending: Family | Primary: Nurse Practitioner

## 2016-12-26 ENCOUNTER — Encounter: Primary: Nurse Practitioner

## 2016-12-26 DIAGNOSIS — I10 Essential (primary) hypertension: Principal | ICD-10-CM

## 2016-12-26 NOTE — Progress Notes
Pt was in for a BP check due to HTN     Glover,ARNP spoke with pt and she still complained of dizziness and she advised pt to stop HCTZ and come back in one week for a recheck on her BP .  Pt was advised to continue her Lopressor ( taking 0.5 tab twice daily )  Per her last visit .

## 2016-12-26 NOTE — Progress Notes
Pt was in for a BP check due to HTN     Glover,ARNP spoke with pt and she still complained of dizziness and she advised pt to stop HCTZ and come back in one week for a recheck on her BP .

## 2016-12-27 NOTE — Progress Notes
No teaching statement needed

## 2017-01-02 ENCOUNTER — Ambulatory Visit: Attending: Family | Primary: Nurse Practitioner

## 2017-01-02 DIAGNOSIS — I10 Essential (primary) hypertension: Secondary | ICD-10-CM

## 2017-01-02 NOTE — Progress Notes
Pt in clinic for a BP check    Pt reports has stopped taking HCTZ, last took over a week.   Pt taking metoprolol tartrate 25mg  PO BID  Pt is a nonsmoker    BP in clinic:  129/85     P: 86    Advised ARNP Dian SituPortia Glover and discussed with pt to discontinue HCTZ and continue monitoring BP at home. Pt aware to contact clinic of BP stays above 140/90 and to seek ED if experiences any acute s/s with high BP readings.

## 2017-01-03 NOTE — Progress Notes
No teaching statement needed

## 2017-01-23 DIAGNOSIS — F411 Generalized anxiety disorder: Principal | ICD-10-CM

## 2017-01-24 MED ORDER — DIAZEPAM 2 MG PO TABS
2 mg | Freq: Every day | ORAL | 0 refills | Status: CP | PRN
Start: 2017-01-24 — End: 2017-02-28

## 2017-01-24 NOTE — Telephone Encounter
I called and spoke with pt's daughter in law and she is aware RX is ready for pick up.

## 2017-01-24 NOTE — Telephone Encounter
Medications printed.  Thanks,  Garri

## 2017-01-24 NOTE — Telephone Encounter
Pt picked up RX

## 2017-02-28 ENCOUNTER — Encounter: Attending: Nurse Practitioner | Primary: Nurse Practitioner

## 2017-02-28 DIAGNOSIS — F411 Generalized anxiety disorder: Principal | ICD-10-CM

## 2017-02-28 MED ORDER — DIAZEPAM 2 MG PO TABS
2 mg | Freq: Every day | ORAL | 0 refills | Status: CP | PRN
Start: 2017-02-28 — End: 2017-03-31

## 2017-02-28 NOTE — Telephone Encounter
Med's printed to be signed.  Thanks,  Arvil Chaco

## 2017-02-28 NOTE — Telephone Encounter
I called to advise pt and advise. Spoke with Tiffany ( ROI) and she is aware.

## 2017-02-28 NOTE — Telephone Encounter
Patient's last visit was 12/19/2016 and she saw glover     She was set for a physical 02/28/17 and we had to cancel to due to provider being ill.  Appt moved to 03/22/17

## 2017-03-20 ENCOUNTER — Encounter: Attending: Nurse Practitioner | Primary: Nurse Practitioner

## 2017-03-22 ENCOUNTER — Ambulatory Visit: Attending: Nurse Practitioner | Primary: Nurse Practitioner

## 2017-03-22 DIAGNOSIS — E785 Hyperlipidemia, unspecified: Secondary | ICD-10-CM

## 2017-03-22 DIAGNOSIS — G4733 Obstructive sleep apnea (adult) (pediatric): Secondary | ICD-10-CM

## 2017-03-22 DIAGNOSIS — F329 Major depressive disorder, single episode, unspecified: Secondary | ICD-10-CM

## 2017-03-22 DIAGNOSIS — M79671 Pain in right foot: Secondary | ICD-10-CM

## 2017-03-22 DIAGNOSIS — Z6832 Body mass index (BMI) 32.0-32.9, adult: Secondary | ICD-10-CM

## 2017-03-22 DIAGNOSIS — R238 Other skin changes: Secondary | ICD-10-CM

## 2017-03-22 DIAGNOSIS — N289 Disorder of kidney and ureter, unspecified: Secondary | ICD-10-CM

## 2017-03-22 DIAGNOSIS — F419 Anxiety disorder, unspecified: Secondary | ICD-10-CM

## 2017-03-22 DIAGNOSIS — K56609 Unspecified intestinal obstruction, unspecified as to partial versus complete obstruction: Secondary | ICD-10-CM

## 2017-03-22 DIAGNOSIS — Z8719 Personal history of other diseases of the digestive system: Secondary | ICD-10-CM

## 2017-03-22 DIAGNOSIS — D649 Anemia, unspecified: Secondary | ICD-10-CM

## 2017-03-22 DIAGNOSIS — E669 Obesity, unspecified: Secondary | ICD-10-CM

## 2017-03-22 DIAGNOSIS — K651 Peritoneal abscess: Secondary | ICD-10-CM

## 2017-03-22 DIAGNOSIS — J449 Chronic obstructive pulmonary disease, unspecified: Secondary | ICD-10-CM

## 2017-03-22 DIAGNOSIS — R9431 Abnormal electrocardiogram [ECG] [EKG]: Secondary | ICD-10-CM

## 2017-03-22 DIAGNOSIS — I10 Essential (primary) hypertension: Principal | ICD-10-CM

## 2017-03-22 DIAGNOSIS — E349 Endocrine disorder, unspecified: Secondary | ICD-10-CM

## 2017-03-22 DIAGNOSIS — Z8541 Personal history of malignant neoplasm of cervix uteri: Secondary | ICD-10-CM

## 2017-03-22 DIAGNOSIS — R32 Unspecified urinary incontinence: Secondary | ICD-10-CM

## 2017-03-22 DIAGNOSIS — Z87891 Personal history of nicotine dependence: Secondary | ICD-10-CM

## 2017-03-22 DIAGNOSIS — G4700 Insomnia, unspecified: Secondary | ICD-10-CM

## 2017-03-22 DIAGNOSIS — I499 Cardiac arrhythmia, unspecified: Secondary | ICD-10-CM

## 2017-03-22 DIAGNOSIS — K449 Diaphragmatic hernia without obstruction or gangrene: Secondary | ICD-10-CM

## 2017-03-22 DIAGNOSIS — G8929 Other chronic pain: Secondary | ICD-10-CM

## 2017-03-22 DIAGNOSIS — L98499 Non-pressure chronic ulcer of skin of other sites with unspecified severity: Secondary | ICD-10-CM

## 2017-03-22 DIAGNOSIS — R0683 Snoring: Secondary | ICD-10-CM

## 2017-03-22 DIAGNOSIS — L509 Urticaria, unspecified: Secondary | ICD-10-CM

## 2017-03-22 MED ORDER — HYDROXYZINE HCL 25 MG PO TABS
25 mg | Freq: Two times a day (BID) | ORAL | 1 refills | Status: CP | PRN
Start: 2017-03-22 — End: 2017-03-29

## 2017-03-22 MED ORDER — LISINOPRIL 10 MG PO TABS
10 mg | Freq: Every day | ORAL | 1 refills | Status: CP
Start: 2017-03-22 — End: 2017-04-15

## 2017-03-22 MED ORDER — METOPROLOL TARTRATE 25 MG PO TABS: Start: 2017-03-22 — End: 2017-03-29

## 2017-03-22 NOTE — Progress Notes
User Key  (r) = Recorded By, (t) = Taken By, (c) = Cosigned By    [facility] Name Effective Dates    PM Ashok CordiaMahan, Porsche A, MA 10/13/15 -     Lyda JesterSK Ketchie, Sara, MA 10/13/15 -         Physical Exam   Constitutional: She is oriented to person, place, and time. She appears well-developed and well-nourished. No distress.   HENT:   Head: Normocephalic and atraumatic.   Nose: Nose normal.   Eyes: Pupils are equal, round, and reactive to light. Conjunctivae and EOM are normal. Right eye exhibits no discharge. Left eye exhibits no discharge.   Wearing eye glasses   Neck: No tracheal deviation present.   Cardiovascular: Normal rate and normal heart sounds.    Pulmonary/Chest: Effort normal and breath sounds normal. No respiratory distress. She has no wheezes. She has no rales. She exhibits no tenderness.   Abdominal: Soft. She exhibits no distension.   Genitourinary:   Genitourinary Comments: Deferred at current   Musculoskeletal: She exhibits tenderness.   Pain to palpation to dorsal surface of R foot  Pain when weight bearing  Generalized normal ROM   Neurological: She is alert and oriented to person, place, and time.   Skin: Skin is warm and dry. Rash noted. There is erythema.   Hives to upper and lower extremities-   Psychiatric: She has a normal mood and affect. Her behavior is normal. Judgment and thought content normal.   Vitals reviewed.    Assessment:       ICD-10-CM ICD-9-CM    1. HTN, goal below 130/80 I10 401.9 lisinopril (PRINIVIL,ZESTRIL) 10 MG PO Tablet      Refer to External Provider   2. H/O tobacco use, presenting hazards to health [phone] V15.82 Refer to External Provider   3. Right foot pain M79.671 729.5 XR Foot Right 3 Views   4. Anxiety F41.9 300.00 hydrOXYzine HCl (ATARAX) 25 MG PO Tablet   5. Hives L50.9 708.9 hydrOXYzine HCl (ATARAX) 25 MG PO Tablet   6. Abnormal ECG R94.31 794.31 Refer to External Provider   7. Dyslipidemia E78.5 272.4 Refer to External Provider

## 2017-03-22 NOTE — Progress Notes
I agree with the plan and assessment as above, no teaching statement needed.

## 2017-03-22 NOTE — Progress Notes
?   No Known Problems Maternal Uncle    ? No Known Problems Paternal Aunt    ? No Known Problems Paternal Uncle      Social History     Social History   ? Marital status: Widowed     Spouse name: N/A   ? Number of children: N/A   ? Years of education: N/A     Occupational History   ? Not on file.     Social History Main Topics   ? Smoking status: Former Smoker     Packs/day: 1.00     Years: 30.00     Types: Cigarettes     Start date: 08/09/1982     Quit date: 07/11/2012   ? Smokeless tobacco: Never Used   ? Alcohol use No      Comment: 2cans of beer a week   ? Drug use: No   ? Sexual activity: No     Other Topics Concern   ? Not on file     Social History Narrative   ? No narrative on file     Current Outpatient Prescriptions on File Prior to Visit   Medication Sig   ? atorvastatin (LIPITOR) 20 MG PO Tablet Take 1 tablet by mouth nightly at bedtime.   ? diazePAM (VALIUM) 2 MG PO Tablet Take 1 tablet by mouth daily as needed for anxiety or sleep.   ? diclofenac (VOLTAREN GEL) 1 % TD Gel Apply to affected area   ? ketoconazole (NIZORAL) 2 % EX Cream Use topically daily.   ? montelukast (SINGULAIR) 10 MG PO Tablet Take 1 tablet by mouth nightly at bedtime.   ? QUEtiapine (SEROquel) 25 MG PO Tablet Take 1 tablet by mouth 2 times daily.   ? rOPINIRole (REQUIP) 2 MG PO Tablet Take 1 tablet by mouth nightly at bedtime.   ? [DISCONTINUED] hydroCHLOROthiazide (HYDRODIURIL) 12.5 MG PO Tablet Take 1 tablet by mouth daily.   ? [DISCONTINUED] metoprolol tartrate (LOPRESSOR) 25 MG PO Tablet Take 1 tablet by mouth 2 times daily for 90 days.     No current facility-administered medications on file prior to visit.      Allergies   Allergen Reactions   ? Seasonal Allergies Other (See Comments)     Not listed    ? Soma [Carisoprodol] Other (See Comments)     " makes me drunk"       Review of Systems  Review of Systems   Constitutional: Positive for fatigue. Negative for fever.   Eyes: Negative for visual disturbance.

## 2017-03-22 NOTE — Progress Notes
stomach and stopped. PT reports she can feel "knots" on her R side of neck and has limitation of ROM when turning side to side. Discussion followed  -PT also reports improvement with RLS symptoms since increasing restoril-requesting refills. Discussion followed  -Pt also with anxiety and requesting refills of valium-currently written for BID but, only taking once daily. PT denies any adverse SEs to medications. Discussion followed  -Due for labs and annual physical exam.  Former smoker.  -Pt denies any homicidal/suicidal ideations    -Pt denies any other acute complaints  Past Medical History:   Diagnosis Date   ? Abscess of abdominal cavity    ? Anemia    ? Anxiety    ? Anxiety disorder    ? Bladder incontinence    ? Chronic pain    ? Chronic skin ulcer    ? COPD (chronic obstructive pulmonary disease)    ? Depression    ? Easy bruising    ? Hiatal hernia    ? History of cervical cancer    ? History of diverticulitis of colon    ? Hormone disorder    ? HTN (hypertension)    ? Hyperlipidemia    ? Insomnia    ? Irregular heart beat    ? Kidney disease    ? OSA (obstructive sleep apnea)    ? Small bowel obstruction    ? Snoring disorder      Past Surgical History:   Procedure Laterality Date   ? COLECTOMY       Partial Colectomy; laparoscopic sigmoid colectomy (08/19/11)    ? COLONOSCOPY     ? ESOPHAGOGASTROSCOPY Left 01/01/2013    ESOPHAGOGASTROSCOPY performed by Noel GeroldEid, Amalie F, MD at [facility]   ? HYSTERECTOMY      complete due to cervical cancer      Family History   Problem Relation Age of Onset   ? Heart Disease Other         none   ? High Blood Pressure Mother    ? Depression Mother    ? No Known Problems Father    ? No Known Problems Sister    ? No Known Problems Brother    ? No Known Problems Maternal Grandmother    ? No Known Problems Maternal Grandfather    ? No Known Problems Paternal Grandmother    ? No Known Problems Paternal Grandfather    ? No Known Problems Maternal Aunt

## 2017-03-22 NOTE — Progress Notes
Respiratory: Negative for shortness of breath.    Cardiovascular: Negative for chest pain.   Musculoskeletal: Positive for neck pain.        R foot pain-dorsal surface   Skin: Positive for rash.   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 The patient is nervous/anxious.         Not at goal   All other systems reviewed and are negative.    Objective:        VITAL SIGNS (all recorded)      Clinic Vitals     Row Name 03/22/17 1313 03/22/17 1451             Amb Encounter Vitals    Weight 77.7 kg (171 lb 6.4 oz)    -PM at 03/22/17 1324  ?      Height 1.549 m (5\' 1" )    -PM at 03/22/17 1313  ?      BMI (Calculated) 32.45    -PM at 03/22/17 1324  ?      BSA (Calculated - sq m) 1.83    -PM at 03/22/17 1324  ?      BP (!)  169/103    -PM at 03/22/17 1328 (!)  150/92   manual    -SK at 03/22/17 1451      BP Location Left upper arm    -PM at 03/22/17 1328 Right upper arm    -SK at 03/22/17 1451      Position Sitting    -PM at 03/22/17 1313 Sitting    -SK at 03/22/17 1451      Pulse 89    -PM at 03/22/17 1328  ?      Pulse Source Monitor    -PM at 03/22/17 1313  ?      Pulse Quality Normal    -PM at 03/22/17 1328  ?      Resp 20    -PM at 03/22/17 1328  ?      Respiration Quality Normal    -PM at 03/22/17 1328  ?      Temp 36.7 ?C (98 ?F)    -PM at 03/22/17 1328  ?      Temperature Source Oral    -PM at 03/22/17 1313  ?      O2 Saturation 97 %    -PM at 03/22/17 1328  ?      Pain Score EIGHT    -PM at 03/22/17 1324  ?      Location right foot     -PM at 03/22/17 1324  ?      Last Menstrual Period ?   hsyterectomy    -PM at 03/22/17 1313  ?         Education/Communication Barriers?    Learning/Communication Barriers? No    -PM at 03/22/17 1313  ?         Fall Risk Assessment    Had recent fall / Last 6 months? No recent fall    -PM at 03/22/17 1313  ?      Does patient have a fear of falling? No    -PM at 03/22/17 1313  ?

## 2017-03-22 NOTE — Progress Notes
Subjective:   Tina Key is a 61 y.o. female being seen today for Toe Pain (right foot ) and Urticaria (x4days)       HPI  -Pt presents to clinic today for foot pain-R foot; Pt reports she had a previous hairline fx previously. Pt reports symptoms increased since she started working out at the gym. Pt reports she was walking on treadmill but, had to switch to bike. Discussion followed  -PT also with hives to upper and lower extremities. PT reports increase in anxiety due to current living situation and reports previous similar episode when anxiety worsened. Pt also reports not sleeping well at night. Pt does not currently use therapy/psych-was previously stable on below medications. Denies homicidal/suicidal ideations.  -Pt with elevated BP. States she was taken down to 0.5 tab of med when previously on BID with diuretic bc she was having episodes of dizziness and BP reading at last OV was <100/50-symptomatic. PT with hx of longterm tobacco use;dyslipidemia; and HTN. Discussion followed  -Pt current weight/BMI=171/32  [name]'s Estimated body mass index is 32.39 kg/m? as calculated from the following:    Height as of this encounter: 1.549 m (5\' 1" ).    Weight as of this encounter: 77.7 kg (171 lb 6.4 oz).      The health risks associated with an elevated BMI were discussed and education was provided.  See orders for any further follow up plans.    -Pt former smoker    -PT to reschedule physical    -pt denies any other acute complaints    From Previous Visit:  -Tina Key presents to office for general f/u and medication refills.  -C/o sinus pressure, congestion, postnasal drip and reports that symptoms started 2-3 weeks ago. Taking OTC allegra with no relief-pt would like to discuss adjustment. Discussion followed  -Pt also c/o neck pain that has been ongoing for a few weeks-pt reports she tried ITC IBU but, had stomach issues as she was taking on an empty

## 2017-03-22 NOTE — Progress Notes
8. Class 1 obesity with serious comorbidity and body mass index (BMI) of 32.0 to 32.9 in adult, unspecified obesity type E66.9 278.00     Z68.32 V85.32      Plan:     Orders Placed This Encounter   Medications   ? lisinopril (PRINIVIL,ZESTRIL) 10 MG PO Tablet     Sig: Take 1 tablet by mouth daily.     Dispense:  30 tablet     Refill:  1   ? hydrOXYzine HCl (ATARAX) 25 MG PO Tablet     Sig: Take 1 tablet by mouth 2 times daily as needed for itching or anxiety.     Dispense:  60 tablet     Refill:  1     Orders Placed This Encounter   Procedures   ? XR Foot Right 3 Views   ? Refer to External Provider     -HTN/BMI/Abnormal ecg: Not at goal; rx started as above-will likely increase and take off of beta blocker; referred to cards for eval-likely LVH on ECG; needs work up due to hx  -Foot Pain: Xray ordered and f/u in one week  -anxiety: not at goal. Will start atarax as above and have pt f/u in one week    -Pt due for physical-reschedule  -Pt for f/u in one week  Health Maintenance was reviewed. The patient's HM Topic list was:                                            Health Maintenance   Topic Date Due   ? USPSTF Hepatitis C Screening  11-22-1955   ? USPSTF HIV Risk Assessment  05/25/1969   ? DTaP,Tdap,and Td Vaccines (1 - Tdap) 05/25/1973   ? Zoster Vaccine (1 of 2) 05/25/2004   ? Chest CT / LDCT  05/25/2009   ? Lipid Profile  09/22/2011   ? Breast Cancer Screening  09/30/2012   ? Basic Metabolic Panel  04/22/2015   ? Preventive Wellness Visit  07/09/2015   ? Influenza Vaccine (1) 03/11/2017   ? Colon Cancer Screening  05/16/2022

## 2017-03-29 ENCOUNTER — Ambulatory Visit: Attending: Nurse Practitioner | Primary: Nurse Practitioner

## 2017-03-29 DIAGNOSIS — Z8719 Personal history of other diseases of the digestive system: Secondary | ICD-10-CM

## 2017-03-29 DIAGNOSIS — I499 Cardiac arrhythmia, unspecified: Secondary | ICD-10-CM

## 2017-03-29 DIAGNOSIS — F329 Major depressive disorder, single episode, unspecified: Secondary | ICD-10-CM

## 2017-03-29 DIAGNOSIS — L509 Urticaria, unspecified: Secondary | ICD-10-CM

## 2017-03-29 DIAGNOSIS — G4700 Insomnia, unspecified: Secondary | ICD-10-CM

## 2017-03-29 DIAGNOSIS — I10 Essential (primary) hypertension: Principal | ICD-10-CM

## 2017-03-29 DIAGNOSIS — L98499 Non-pressure chronic ulcer of skin of other sites with unspecified severity: Secondary | ICD-10-CM

## 2017-03-29 DIAGNOSIS — J449 Chronic obstructive pulmonary disease, unspecified: Secondary | ICD-10-CM

## 2017-03-29 DIAGNOSIS — D649 Anemia, unspecified: Secondary | ICD-10-CM

## 2017-03-29 DIAGNOSIS — Z6832 Body mass index (BMI) 32.0-32.9, adult: Secondary | ICD-10-CM

## 2017-03-29 DIAGNOSIS — K651 Peritoneal abscess: Secondary | ICD-10-CM

## 2017-03-29 DIAGNOSIS — Z8541 Personal history of malignant neoplasm of cervix uteri: Secondary | ICD-10-CM

## 2017-03-29 DIAGNOSIS — F419 Anxiety disorder, unspecified: Secondary | ICD-10-CM

## 2017-03-29 DIAGNOSIS — K56609 Unspecified intestinal obstruction, unspecified as to partial versus complete obstruction: Secondary | ICD-10-CM

## 2017-03-29 DIAGNOSIS — R238 Other skin changes: Secondary | ICD-10-CM

## 2017-03-29 DIAGNOSIS — N289 Disorder of kidney and ureter, unspecified: Secondary | ICD-10-CM

## 2017-03-29 DIAGNOSIS — E6609 Other obesity due to excess calories: Secondary | ICD-10-CM

## 2017-03-29 DIAGNOSIS — R32 Unspecified urinary incontinence: Secondary | ICD-10-CM

## 2017-03-29 DIAGNOSIS — R9431 Abnormal electrocardiogram [ECG] [EKG]: Secondary | ICD-10-CM

## 2017-03-29 DIAGNOSIS — G8929 Other chronic pain: Secondary | ICD-10-CM

## 2017-03-29 DIAGNOSIS — M79671 Pain in right foot: Secondary | ICD-10-CM

## 2017-03-29 DIAGNOSIS — E349 Endocrine disorder, unspecified: Secondary | ICD-10-CM

## 2017-03-29 DIAGNOSIS — R0683 Snoring: Secondary | ICD-10-CM

## 2017-03-29 DIAGNOSIS — K449 Diaphragmatic hernia without obstruction or gangrene: Secondary | ICD-10-CM

## 2017-03-29 DIAGNOSIS — E785 Hyperlipidemia, unspecified: Secondary | ICD-10-CM

## 2017-03-29 DIAGNOSIS — G4733 Obstructive sleep apnea (adult) (pediatric): Secondary | ICD-10-CM

## 2017-03-29 MED ORDER — HYDROXYZINE HCL 50 MG PO TABS
50 mg | Freq: Two times a day (BID) | ORAL | 2 refills | Status: CP | PRN
Start: 2017-03-29 — End: 2017-09-29

## 2017-03-29 NOTE — Progress Notes
previously on BID with diuretic bc she was having episodes of dizziness and BP reading at last OV was <100/50-symptomatic. PT with hx of longterm tobacco use;dyslipidemia; and HTN. Discussion followed      Past Medical History:   Diagnosis Date   ? Abscess of abdominal cavity    ? Anemia    ? Anxiety    ? Anxiety disorder    ? Bladder incontinence    ? Chronic pain    ? Chronic skin ulcer    ? COPD (chronic obstructive pulmonary disease)    ? Depression    ? Easy bruising    ? Hiatal hernia    ? History of cervical cancer    ? History of diverticulitis of colon    ? Hormone disorder    ? HTN (hypertension)    ? Hyperlipidemia    ? Insomnia    ? Irregular heart beat    ? Kidney disease    ? OSA (obstructive sleep apnea)    ? Small bowel obstruction    ? Snoring disorder      Past Surgical History:   Procedure Laterality Date   ? COLECTOMY       Partial Colectomy; laparoscopic sigmoid colectomy (08/19/11)    ? COLONOSCOPY     ? ESOPHAGOGASTROSCOPY Left 01/01/2013    ESOPHAGOGASTROSCOPY performed by Noel Gerold, MD at [facility]   ? HYSTERECTOMY      complete due to cervical cancer      Family History   Problem Relation Age of Onset   ? Heart Disease Other         none   ? High Blood Pressure Mother    ? Depression Mother    ? No Known Problems Father    ? No Known Problems Sister    ? No Known Problems Brother    ? No Known Problems Maternal Grandmother    ? No Known Problems Maternal Grandfather    ? No Known Problems Paternal Grandmother    ? No Known Problems Paternal Grandfather    ? No Known Problems Maternal Aunt    ? No Known Problems Maternal Uncle    ? No Known Problems Paternal Aunt    ? No Known Problems Paternal Uncle      Social History     Social History   ? Marital status: Widowed     Spouse name: N/A   ? Number of children: N/A   ? Years of education: N/A     Occupational History   ? Not on file.     Social History Main Topics   ? Smoking status: Former Smoker     Packs/day: 1.00     Years: 30.00

## 2017-03-29 NOTE — Progress Notes
patient is nervous/anxious.         Some improvement with med   All other systems reviewed and are negative.    Objective:        VITAL SIGNS (all recorded)      Clinic Vitals     Row Name 03/22/17 1313 03/22/17 1451 03/29/17 1419          Amb Encounter Vitals    Weight 77.7 kg (171 lb 6.4 oz)    -PM at 03/22/17 1324  ? 77.2 kg (170 lb 3.2 oz)    -SK at 03/29/17 1428     Height 1.549 m ( )    -PM at 03/22/17 1313  ? 1.549 m ( )    -SK at 03/29/17 1419     BMI (Calculated) 32.45    -PM at 03/22/17 1324  ? 32.23    -SK at 03/29/17 1428     BSA (Calculated - sq m) 1.83    -PM at 03/22/17 1324  ? 1.82    -SK at 03/29/17 1428     BP (!)  169/103    -PM at 03/22/17 1328 (!)  150/92   manual    -SK at 03/22/17 1451 121/90    -SK at 03/29/17 1428     BP Location Left upper arm    -PM at 03/22/17 1328 Right upper arm    -SK at 03/22/17 1451 Right lower arm    -SK at 03/29/17 1428     Position Sitting    -PM at 03/22/17 1313 Sitting    -SK at 03/22/17 1451 Sitting    -SK at 03/29/17 1419     Pulse 89    -PM at 03/22/17 1328  ? 100    -SK at 03/29/17 1428     Pulse Source Monitor    -PM at 03/22/17 1313  ? Monitor    -SK at 03/29/17 1419     Pulse Quality Normal    -PM at 03/22/17 1328  ? Normal    -SK at 03/29/17 1428     Resp 20    -PM at 03/22/17 1328  ? 16    -SK at 03/29/17 1428     Respiration Quality Normal    -PM at 03/22/17 1328  ? Normal    -SK at 03/29/17 1428     Temp 36.7 ?C (98 ?F)    -PM at 03/22/17 1328  ? 36.7 ?C (98 ?F)    -SK at 03/29/17 1428     Temperature Source Oral    -PM at 03/22/17 1313  ? Oral    -SK at 03/29/17 1419     O2 Saturation 97 %    -PM at 03/22/17 1328  ? 95 %    -SK at 03/29/17 1428     Pain Score EIGHT    -PM at 03/22/17 1324  ? EIGHT    -SK at 03/29/17 1428     Location right foot     -PM at 03/22/17 1324  ? pt states right foot    -SK at 03/29/17 1428     Last Menstrual Period ?   hsyterectomy    -PM at 03/22/17 1313  ? ?   HYSTERECTOMY    -SK at 03/29/17 1419

## 2017-03-29 NOTE — Progress Notes
Subjective:   Tina Key is a 60 y.o. female being seen today for Hypertension       HPI  -PT presents to clinic today for f/u HTN and rash-see below. Pt reports since starting lisinopril BP has been improved but, decreases to <90/60 with metoprolol together and pt is symptomatic. PT brings with her the BP journal for review. Pt with at goal BP in office. Discussion followed  -Pt reports no real improvement to below rash with atarax-does state some improvement in anxiety but, feels it could be increased just a bit. Pt denies any adverse SEs to medication. Denies any current homicidal/suicidal ideations. Discussion followed  -Pt current weight/BMI=170/32  Tina Key's Estimated body mass index is 32.16 kg/m? as calculated from the following:    Height as of this encounter: 1.549 m ( ).    Weight as of this encounter: 77.2 kg (170 lb 3.2 oz).      The health risks associated with an elevated BMI were discussed and education was provided.  See orders for any further follow up plans.    -Pt with continued foot pain-pt has xray scheduled for tomorrow am-will call with results    -Pt former smoker    -PT to reschedule physical    -pt denies any other acute complaints    From Previous Visit:  -Pt presents to clinic today for foot pain-R foot; Pt reports she had a previous hairline fx previously. Pt reports symptoms increased since she started working out at the gym. Pt reports she was walking on treadmill but, had to switch to bike. Discussion followed  -PT also with hives to upper and lower extremities. PT reports increase in anxiety due to current living situation and reports previous similar episode when anxiety worsened. Pt also reports not sleeping well at night. Pt does not currently use therapy/psych-was previously stable on below medications. Denies homicidal/suicidal ideations.  -Pt with elevated BP. States she was taken down to 0.5 tab of med when

## 2017-03-29 NOTE — Progress Notes
Education/Communication Barriers?    Learning/Communication Barriers? No    -PM at 03/22/17 1313  ? No    -SK at 03/29/17 1419        Fall Risk Assessment    Had recent fall / Last 6 months? No recent fall    -PM at 03/22/17 1313  ? No recent fall    -SK at 03/29/17 1419     Does patient have a fear of falling? No    -PM at 03/22/17 1313  ? No    -SK at 03/29/17 1419       User Key  (r) = Recorded By, (t) = Taken By, (c) = Cosigned By    [facility] Name Effective Dates    PM Ashok Cordia, MA 10/13/15 -     SK Renard Matter, MA 10/13/15 -         Physical Exam   Constitutional: She is oriented to person, place, and time. She appears well-developed and well-nourished. No distress.   HENT:   Head: Normocephalic and atraumatic.   Nose: Nose normal.   Eyes: Pupils are equal, round, and reactive to light. Conjunctivae and EOM are normal. Right eye exhibits no discharge. Left eye exhibits no discharge.   Wearing eye glasses   Neck: No tracheal deviation present.   Cardiovascular: Normal rate and normal heart sounds.    Pulmonary/Chest: Effort normal and breath sounds normal. No respiratory distress. She has no wheezes. She has no rales. She exhibits no tenderness.   Abdominal: Soft. She exhibits no distension.   Genitourinary:   Genitourinary Comments: Deferred at current   Musculoskeletal: She exhibits tenderness.   Pain to palpation to dorsal surface of R foot  Pain when weight bearing  Generalized normal ROM  continues   Neurological: She is alert and oriented to person, place, and time.   Skin: Skin is warm and dry. Rash noted. There is erythema.   Hives to upper and lower extremities-continues   Psychiatric: She has a normal mood and affect. Her behavior is normal. Judgment and thought content normal.   Vitals reviewed.    Assessment:       ICD-10-CM ICD-9-CM    1. HTN, goal below 140/80 I10 401.9    2. Anxiety F41.9 300.00 hydrOXYzine HCl (ATARAX) 50 MG PO Tablet

## 2017-03-29 NOTE — Progress Notes
Types: Cigarettes     Start date: 08/09/1982     Quit date: 07/11/2012   ? Smokeless tobacco: Never Used   ? Alcohol use No      Comment: 2cans of beer a week   ? Drug use: No   ? Sexual activity: No     Other Topics Concern   ? Not on file     Social History Narrative   ? No narrative on file     Current Outpatient Prescriptions on File Prior to Visit   Medication Sig   ? atorvastatin (LIPITOR) 20 MG PO Tablet Take 1 tablet by mouth nightly at bedtime.   ? diazePAM (VALIUM) 2 MG PO Tablet Take 1 tablet by mouth daily as needed for anxiety or sleep.   ? diclofenac (VOLTAREN GEL) 1 % TD Gel Apply to affected area   ? [DISCONTINUED] hydrOXYzine HCl (ATARAX) 25 MG PO Tablet Take 1 tablet by mouth 2 times daily as needed for itching or anxiety.   ? ketoconazole (NIZORAL) 2 % EX Cream Use topically daily.   ? lisinopril (PRINIVIL,ZESTRIL) 10 MG PO Tablet Take 1 tablet by mouth daily.   ? [DISCONTINUED] metoprolol tartrate (LOPRESSOR) 25 MG PO Tablet    ? montelukast (SINGULAIR) 10 MG PO Tablet Take 1 tablet by mouth nightly at bedtime.   ? QUEtiapine (SEROquel) 25 MG PO Tablet Take 1 tablet by mouth 2 times daily.   ? rOPINIRole (REQUIP) 2 MG PO Tablet Take 1 tablet by mouth nightly at bedtime.     No current facility-administered medications on file prior to visit.      Allergies   Allergen Reactions   ? Seasonal Allergies Other (See Comments)     Not listed    ? Soma [Carisoprodol] Other (See Comments)     " makes me drunk"       Review of Systems  Review of Systems   Constitutional: Positive for fatigue. Negative for fever.   Eyes: Negative for visual disturbance.   Respiratory: Negative for shortness of breath.    Cardiovascular: Negative for chest pain.   Musculoskeletal: Positive for neck pain.        R foot pain-dorsal surface-continues   Skin: Positive for rash.        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 The

## 2017-03-29 NOTE — Progress Notes
3. Hives L50.9 708.9 hydrOXYzine HCl (ATARAX) 50 MG PO Tablet   4. Class 1 obesity due to excess calories with serious comorbidity and body mass index (BMI) of 32.0 to 32.9 in adult E66.09 278.00     Z68.32 V85.32    5. Right foot pain M79.671 729.5    6. Abnormal ECG R94.31 794.31      Plan:     Orders Placed This Encounter   Medications   ? hydrOXYzine HCl (ATARAX) 50 MG PO Tablet     Sig: Take 1 tablet by mouth 2 times daily as needed for itching or anxiety.     Dispense:  60 tablet     Refill:  2     To replace      No orders of the following type(s) were placed in this encounter: Procedures    -HTN/BMI: at goal in office; stop metoprolol and continue lisinopril for now-advised can take  if symptomatic. Appt with cards pending for abnormal ecg   -Foot Pain: Xray ordered and awaiting results-to be scheduled tomorrow  -anxiety: not at goal. increased atarax as above and will eval at next appt    -Pt due for physical-reschedule  Health Maintenance was reviewed. The patient's HM Topic list was:                                            Health Maintenance   Topic Date Due   ? USPSTF Hepatitis C Screening  1955-09-19   ? USPSTF HIV Risk Assessment  05/25/1969   ? DTaP,Tdap,and Td Vaccines (1 - Tdap) 05/25/1973   ? Zoster Vaccine (1 of 2) 05/25/2004   ? Chest CT / LDCT  05/25/2009   ? Lipid Profile  09/22/2011   ? Breast Cancer Screening  09/30/2012   ? Basic Metabolic Panel  04/22/2015   ? Preventive Wellness Visit  07/09/2015   ? Influenza Vaccine (1) 03/11/2017   ? Colon Cancer Screening  05/16/2022

## 2017-03-29 NOTE — Progress Notes
I agree with the plan and assessment as above, no teaching statement needed.

## 2017-03-31 DIAGNOSIS — Z9109 Other allergy status, other than to drugs and biological substances: Secondary | ICD-10-CM

## 2017-03-31 DIAGNOSIS — F411 Generalized anxiety disorder: Principal | ICD-10-CM

## 2017-03-31 DIAGNOSIS — E782 Mixed hyperlipidemia: Secondary | ICD-10-CM

## 2017-03-31 MED ORDER — DIAZEPAM 2 MG PO TABS
2 mg | Freq: Every day | ORAL | 0 refills | Status: CP | PRN
Start: 2017-03-31 — End: 2017-06-06

## 2017-03-31 MED ORDER — ATORVASTATIN CALCIUM 20 MG PO TABS
20 mg | Freq: Every evening | ORAL | 1 refills | Status: CP
Start: 2017-03-31 — End: 2017-11-09

## 2017-03-31 MED ORDER — MONTELUKAST SODIUM 10 MG PO TABS
10 mg | Freq: Every evening | ORAL | 1 refills | Status: CP
Start: 2017-03-31 — End: 2017-04-15

## 2017-03-31 NOTE — Telephone Encounter
Called and spoke to [place] and informed of previous documentation. Stated understanding.

## 2017-03-31 NOTE — Telephone Encounter
Meds sent to pharmacy  Valium printed but, cannot pick up until next friday 04/07/2017 as med was filled on 03/11/2017

## 2017-04-09 DIAGNOSIS — I10 Essential (primary) hypertension: Principal | ICD-10-CM

## 2017-04-10 MED ORDER — HYDROCHLOROTHIAZIDE 12.5 MG PO TABS
0 refills | Status: CP
Start: 2017-04-10 — End: 2017-06-14

## 2017-04-14 DIAGNOSIS — F330 Major depressive disorder, recurrent, mild: Principal | ICD-10-CM

## 2017-04-14 DIAGNOSIS — I10 Essential (primary) hypertension: Secondary | ICD-10-CM

## 2017-04-14 DIAGNOSIS — Z9109 Other allergy status, other than to drugs and biological substances: Secondary | ICD-10-CM

## 2017-04-14 MED ORDER — LISINOPRIL 10 MG PO TABS
10 mg | Freq: Every day | ORAL | 1 refills | Status: CP
Start: 2017-04-14 — End: 2017-10-23

## 2017-04-14 MED ORDER — QUETIAPINE FUMARATE 25 MG PO TABS
25 mg | Freq: Two times a day (BID) | ORAL | 1 refills | Status: CP
Start: 2017-04-14 — End: 2017-11-09

## 2017-04-14 MED ORDER — MONTELUKAST SODIUM 10 MG PO TABS
10 mg | Freq: Every evening | ORAL | 1 refills | Status: CP
Start: 2017-04-14 — End: 2017-11-09

## 2017-04-25 DIAGNOSIS — G2581 Restless legs syndrome: Principal | ICD-10-CM

## 2017-04-25 MED ORDER — ROPINIROLE HCL 2 MG PO TABS
2 mg | Freq: Every evening | ORAL | 1 refills | Status: CP
Start: 2017-04-25 — End: 2017-11-09

## 2017-04-28 ENCOUNTER — Encounter: Attending: Nurse Practitioner | Primary: Nurse Practitioner

## 2017-05-02 ENCOUNTER — Encounter: Attending: Nurse Practitioner | Primary: Nurse Practitioner

## 2017-06-05 DIAGNOSIS — F411 Generalized anxiety disorder: Principal | ICD-10-CM

## 2017-06-06 MED ORDER — DIAZEPAM 2 MG PO TABS
2 mg | Freq: Every day | ORAL | 0 refills | Status: CP | PRN
Start: 2017-06-06 — End: 2017-08-04

## 2017-06-06 NOTE — Telephone Encounter
I called and spoke with pt's daughter, she is aware to pick up RX..Marland Kitchen

## 2017-06-06 NOTE — Telephone Encounter
Med printed

## 2017-06-14 ENCOUNTER — Ambulatory Visit: Attending: Nurse Practitioner | Primary: Nurse Practitioner

## 2017-06-14 DIAGNOSIS — Z0000 Encounter for general adult medical examination without abnormal findings: Secondary | ICD-10-CM

## 2017-06-14 DIAGNOSIS — R7301 Impaired fasting glucose: Secondary | ICD-10-CM

## 2017-06-14 DIAGNOSIS — Z87891 Personal history of nicotine dependence: Secondary | ICD-10-CM

## 2017-06-14 DIAGNOSIS — L98499 Non-pressure chronic ulcer of skin of other sites with unspecified severity: Secondary | ICD-10-CM

## 2017-06-14 DIAGNOSIS — Z8541 Personal history of malignant neoplasm of cervix uteri: Secondary | ICD-10-CM

## 2017-06-14 DIAGNOSIS — R0683 Snoring: Secondary | ICD-10-CM

## 2017-06-14 DIAGNOSIS — G4700 Insomnia, unspecified: Secondary | ICD-10-CM

## 2017-06-14 DIAGNOSIS — K449 Diaphragmatic hernia without obstruction or gangrene: Secondary | ICD-10-CM

## 2017-06-14 DIAGNOSIS — K56609 Unspecified intestinal obstruction, unspecified as to partial versus complete obstruction: Secondary | ICD-10-CM

## 2017-06-14 DIAGNOSIS — E559 Vitamin D deficiency, unspecified: Secondary | ICD-10-CM

## 2017-06-14 DIAGNOSIS — I10 Essential (primary) hypertension: Principal | ICD-10-CM

## 2017-06-14 DIAGNOSIS — F329 Major depressive disorder, single episode, unspecified: Secondary | ICD-10-CM

## 2017-06-14 DIAGNOSIS — R238 Other skin changes: Secondary | ICD-10-CM

## 2017-06-14 DIAGNOSIS — Z8719 Personal history of other diseases of the digestive system: Secondary | ICD-10-CM

## 2017-06-14 DIAGNOSIS — G8929 Other chronic pain: Secondary | ICD-10-CM

## 2017-06-14 DIAGNOSIS — I499 Cardiac arrhythmia, unspecified: Secondary | ICD-10-CM

## 2017-06-14 DIAGNOSIS — E049 Nontoxic goiter, unspecified: Secondary | ICD-10-CM

## 2017-06-14 DIAGNOSIS — R5381 Other malaise: Secondary | ICD-10-CM

## 2017-06-14 DIAGNOSIS — E349 Endocrine disorder, unspecified: Secondary | ICD-10-CM

## 2017-06-14 DIAGNOSIS — E785 Hyperlipidemia, unspecified: Secondary | ICD-10-CM

## 2017-06-14 DIAGNOSIS — B354 Tinea corporis: Secondary | ICD-10-CM

## 2017-06-14 DIAGNOSIS — J449 Chronic obstructive pulmonary disease, unspecified: Secondary | ICD-10-CM

## 2017-06-14 DIAGNOSIS — N289 Disorder of kidney and ureter, unspecified: Secondary | ICD-10-CM

## 2017-06-14 DIAGNOSIS — F419 Anxiety disorder, unspecified: Secondary | ICD-10-CM

## 2017-06-14 DIAGNOSIS — K651 Peritoneal abscess: Secondary | ICD-10-CM

## 2017-06-14 DIAGNOSIS — R32 Unspecified urinary incontinence: Secondary | ICD-10-CM

## 2017-06-14 DIAGNOSIS — G4733 Obstructive sleep apnea (adult) (pediatric): Secondary | ICD-10-CM

## 2017-06-14 DIAGNOSIS — Z1159 Encounter for screening for other viral diseases: Secondary | ICD-10-CM

## 2017-06-14 DIAGNOSIS — R5383 Other fatigue: Secondary | ICD-10-CM

## 2017-06-14 DIAGNOSIS — D649 Anemia, unspecified: Secondary | ICD-10-CM

## 2017-06-14 MED ORDER — TERBINAFINE HCL 250 MG PO TABS
250 mg | Freq: Every day | ORAL | 0 refills | Status: CP
Start: 2017-06-14 — End: 2017-11-09

## 2017-06-14 NOTE — Progress Notes
?   Anemia    ? Anxiety    ? Anxiety disorder    ? Bladder incontinence    ? Chronic pain    ? Chronic skin ulcer (CMS-HCC code)    ? COPD (chronic obstructive pulmonary disease) (CMS-HCC code)    ? Depression    ? Easy bruising    ? Hiatal hernia    ? History of cervical cancer    ? History of diverticulitis of colon    ? Hormone disorder    ? HTN (hypertension)    ? Hyperlipidemia    ? Insomnia    ? Irregular heart beat    ? Kidney disease    ? OSA (obstructive sleep apnea)    ? Small bowel obstruction (CMS-HCC code)    ? Snoring disorder      Past Surgical History:   Procedure Laterality Date   ? COLECTOMY       Partial Colectomy; laparoscopic sigmoid colectomy (08/19/11)    ? COLONOSCOPY     ? ESOPHAGOGASTROSCOPY Left 01/01/2013    ESOPHAGOGASTROSCOPY performed by Noel GeroldEid, Amalie F, MD at [facility]   ? HYSTERECTOMY      complete due to cervical cancer      Family History   Problem Relation Age of Onset   ? Heart Disease Other         none   ? High Blood Pressure Mother    ? Depression Mother    ? No Known Problems Father    ? No Known Problems Sister    ? No Known Problems Brother    ? No Known Problems Maternal Grandmother    ? No Known Problems Maternal Grandfather    ? No Known Problems Paternal Grandmother    ? No Known Problems Paternal Grandfather    ? No Known Problems Maternal Aunt    ? No Known Problems Maternal Uncle    ? No Known Problems Paternal Aunt    ? No Known Problems Paternal Uncle      Social History   Substance Use Topics   ? Smoking status: Former Smoker     Packs/day: 1.00     Years: 30.00     Types: Cigarettes     Start date: 08/09/1982     Quit date: 07/11/2012   ? Smokeless tobacco: Never Used   ? Alcohol use No      Comment: 2cans of beer a week     Current Outpatient Prescriptions on File Prior to Visit   Medication Sig   ? atorvastatin (LIPITOR) 20 MG PO Tablet Take 1 tablet by mouth nightly at bedtime.   ? diazePAM (VALIUM) 2 MG PO Tablet Take 1 tablet by mouth daily as needed

## 2017-06-14 NOTE — Progress Notes
for anxiety or sleep. Last filled 03/11/2017   ? diclofenac (VOLTAREN GEL) 1 % TD Gel Apply to affected area   ? [DISCONTINUED] hydroCHLOROthiazide (HYDRODIURIL) 12.5 MG PO Tablet TAKE ONE TABLET BY MOUTH EVERY DAY   ? hydrOXYzine HCl (ATARAX) 50 MG PO Tablet Take 1 tablet by mouth 2 times daily as needed for itching or anxiety.   ? ketoconazole (NIZORAL) 2 % EX Cream Use topically daily.   ? lisinopril (PRINIVIL,ZESTRIL) 10 MG PO Tablet Take 1 tablet by mouth daily.   ? montelukast (SINGULAIR) 10 MG PO Tablet Take 1 tablet by mouth nightly at bedtime.   ? QUEtiapine (SEROquel) 25 MG PO Tablet Take 1 tablet by mouth 2 times daily.   ? rOPINIRole (REQUIP) 2 MG PO Tablet Take 1 tablet by mouth nightly at bedtime.     No current facility-administered medications on file prior to visit.      Allergies   Allergen Reactions   ? Seasonal Allergies Other (See Comments)     Not listed    ? Soma [Carisoprodol] Other (See Comments)     " makes me drunk"         Health Risk Assessment  General Care Management       Exercise:  currently not exercising         Social Determinates of Health  Financial Resource Strain  No Data Recorded  Housing Situation  No Data Recorded  Food Insecurity  No Data Recorded  Transportation Needs  No Data Recorded  Physical Activity  No Data Recorded  Stress  No Data Recorded  Social Connections  No Data Recorded  Intimate Partner Violence   No Data Recorded    Functional Ability Assessment 06/14/2017   Eating Feeds self completely.   Continence: Some bowel and/or bladder control.   Bathing: Bathes or showers without help.   Dressing: Dresses self completely.   Toileting: Uses toilet by self.   Transferring: Is able to move in and out of bed or chair.   Home Maintenance Some Assistance   Cooking Some Assistance   Laundry Independent   Telephone Independent   Finances Independent   Medications Some Assistance   Transport Total Assistance   Ambulation status Independent

## 2017-06-14 NOTE — Progress Notes
Cognitive status:  The patient  does not have dementia        PHQ-9/Mood Disorder Scores 06/14/2017   PHQ-9 Total Score 12   Will anti-depressants be prescribed? If yes, complete the mood disorder questionnaire No        No flowsheet data found.    Fall risk - Encounter Level              HEDIS 06/14/2017   Hgb A1C -   Lipid Management  -   Nephropathy Screening -   Systolic blood pressure  3074F - Systolic blood pressure <16430mm Hg   Diastolic blood pressure 3079F - Diastolic blood pressure 80-6689mm Hg   Documentation in record  1157F - Advanced Care Planning documentation in record   Discussion documented in record 1158F - Advanced Care Planning discussion documented in record   Medication list documented 1159F - Medication list documented in medical record   Medication reviewed and reconciled 1160F - Medication review and reconciliation   Functional Status Assessment 1170F - Functional Status Assessment (ADL's, IADL's, Ambulation Status, Cognitive Status)   Eating Feeds self completely.   Continence: Some bowel and/or bladder control.   Bathing: Bathes or showers without help.   Dressing: Dresses self completely.   Toileting: Uses toilet by self.   Transferring: Is able to move in and out of bed or chair.   Home Maintenance Some Assistance   Cooking Some Conservator, museum/galleryAssistance   Laundry Independent   Telephone Independent   Finances Independent   Shopping Some Assistance   Medications Some Assistance   Transport Total Assistance   Ambulation status Independent   Cognitive status:  The patient  does not have dementia   Depression screening 3725F - Screening for Depression   During the past month the patient has often been bothered by: treating   Feeling down, depressed, or hopeless? No   Loss of interest or pleasure in doing things? No   Pain screening 1126F - Pain screening quantified.  No pain issues are present.   Has patient fallen or had problems with walking or balance in the past

## 2017-06-14 NOTE — Progress Notes
Additional screenings/counseling discussed with patient but declined include:   Digital rectal exam (DRE), Flexible Sigmoidoscopy and Pelvic Exam.  I have discussed the increased risk of morbidity and death from their decision.  Patient verbally explained and repeated back increased risks of adverse health consequences as a result of their refusal of scheduling or not completing these diagnostic screenings.     After visit summary provided with screening schedule, treatment options and education for established conditions or risk factors, and personized health advice.

## 2017-06-14 NOTE — Addendum Note
Addended by: [name], [name] on: 06/14/2017 03:26 PM     Modules accepted: Orders

## 2017-06-14 NOTE — Progress Notes
tenderness and no frontal sinus tenderness.   Mouth/Throat: Uvula is midline. Posterior oropharyngeal erythema present.   cobblestoning   Eyes: Pupils are equal, round, and reactive to light. Conjunctivae and EOM are normal. Right eye exhibits no discharge. Left eye exhibits no discharge.   Wearing eye glasses   Neck: Normal range of motion. No JVD present. No tracheal deviation present. Thyromegaly present.   Cardiovascular: Normal rate and normal heart sounds.    Pulmonary/Chest: Effort normal and breath sounds normal. No respiratory distress. She has no wheezes. She has no rales. She exhibits no tenderness.   Abdominal: Soft. Bowel sounds are normal. She exhibits no distension and no mass. There is no tenderness. There is no rebound and no guarding. No hernia.   HSM difficult due to body habitus   Genitourinary:   Genitourinary Comments: Deferred at current   Musculoskeletal: Normal range of motion. She exhibits no edema, tenderness or deformity.   LEs  Normal gait   Lymphadenopathy:     She has no cervical adenopathy.   Neurological: She is alert and oriented to person, place, and time.   Skin: Skin is warm and dry. Rash noted. There is erythema.   Ringworm type rash noted to hands bilaterally-have not responded to topical ketaconazole   Psychiatric: She has a normal mood and affect. Her behavior is normal. Judgment and thought content normal.   Vitals reviewed.    Assessment:       ICD-10-CM ICD-9-CM    1. Routine general medical examination at a health care facility Z00.00 V70.0    2. HTN, goal below 140/80 I10 401.9 Microalbumin/Creat Urine Ratio      Microalbumin/Creat Urine Ratio   3. Dyslipidemia E78.5 272.4 LIPID PANEL      LIPID PANEL   4. Impaired fasting glucose R73.01 790.21 COMPREHENSIVE METABOLIC PANEL      HEMOGLOBIN A1C W/EAG ([facility])      COMPREHENSIVE METABOLIC PANEL      HEMOGLOBIN A1C W/EAG ([facility])   5. Malaise and fatigue R53.81 780.79 CBC AND DIFFERENTIAL

## 2017-06-14 NOTE — Progress Notes
All other systems reviewed and are negative.    Objective:        VITAL SIGNS (all recorded)      Clinic Vitals     Row Name 06/14/17 1319                Amb Encounter Vitals    Weight 76.2 kg (168 lb)    -PM at 06/14/17 1320       Height 1.549 m (5\' 1" )    -PM at 06/14/17 1320       BMI (Calculated) 31.81    -PM at 06/14/17 1320       BSA (Calculated - sq m) 1.81    -PM at 06/14/17 1320       BP 118/86    -PM at 06/14/17 1320       BP Location Left upper arm    -PM at 06/14/17 1320       Position Sitting    -PM at 06/14/17 1320       Pulse 97    -PM at 06/14/17 1320       Pulse Source Monitor    -PM at 06/14/17 1320       Pulse Quality Normal    -PM at 06/14/17 1320       Resp 16    -PM at 06/14/17 1320       Respiration Quality Normal    -PM at 06/14/17 1320       Temp 36.4 ?C (97.5 ?F)    -PM at 06/14/17 1320       Temperature Source Oral    -PM at 06/14/17 1320       O2 Saturation 97 %    -PM at 06/14/17 1320       Pain Score Zero    -PM at 06/14/17 1320       Last Menstrual Period ?   hysterectomy    -PM at 06/14/17 1320          Education/Communication Barriers?    Learning/Communication Barriers? No    -PM at 06/14/17 1320          Fall Risk Assessment    Had recent fall / Last 6 months? No recent fall    -PM at 06/14/17 1320       Does patient have a fear of falling? No    -PM at 06/14/17 1320         User Key  (r) = Recorded By, (t) = Taken By, (c) = Cosigned By    Initials Name Effective Dates    PM Mahan, Porsche A, MA 10/13/15 -         Physical Exam   Constitutional: She is oriented to person, place, and time. She appears well-developed and well-nourished. No distress.   HENT:   Head: Normocephalic and atraumatic.   Right Ear: Hearing, tympanic membrane, external ear and ear canal normal.   Left Ear: Hearing, tympanic membrane, external ear and ear canal normal.   Nose: Nose normal. Right sinus exhibits no maxillary sinus tenderness and no frontal sinus tenderness. Left sinus exhibits no maxillary sinus

## 2017-06-14 NOTE — Progress Notes
Subjective:   Tina Key is a 61 y.o. female being seen today for PHYSICAL EXAM       HPI  -Pt presents to clinic today for physical exam  -Pt current weight/BMI=168/31 was 170/32  Tina Key's Estimated body mass index is 31.74 kg/m? as calculated from the following:    Height as of this encounter: 1.549 m (5\' 1" ).    Weight as of this encounter: 76.2 kg (168 lb).      The health risks associated with an elevated BMI were discussed and education was provided.  See orders for any further follow up plans.    -Pt continues to c/o areas to hands-states she was dx'ed with ringworm and given topical med-denies any resolution with treatment.    -Pt former smoker    Lung Cancer Screening Counseling   1. Patient's date of birth: [birth year].  2. Tina Key is asymptomatic for symptoms of lung cancer.  3. Tina Key has the following Tobacco History:          Social History     Tobacco History     Smoking Status  Former Smoker Smoking Start Date  08/09/1982 Quit date  07/11/2012 Smoking Frequency  1 pack/day for 30 years (30 pk yrs)    Smoking Tobacco Type  Cigarettes    Smokeless Tobacco Use  Never Used              4. Patient has no history of lung cancer and no treatment for cancer within the last 5 years. History of non-melanoma skin cancer excluded.  5. Tina Key is willing and able to undergo lung cancer treatment if lung cancer were to be diagnosed.  6. Patient has had no respiratory or lung infection requiring antibiotics within the last 12 weeks  7. Tina Key, [name] Hayes, ARNP have verified the above eligibility criteria for lung cancer screening.     During this visit, I counseled the patient on lung cancer screening and used the lung cancer screening Option Grid to guide our personalized, shared decision making process.    The patient was counseled on the potential harms and benefits of lung cancer screening including the false positive rate (about 25% for one

## 2017-06-14 NOTE — Progress Notes
Transport Total Assistance   Ambulation status Independent   Cognitive status:  The patient  does not have dementia        PHQ-9/Mood Disorder Scores 06/14/2017   PHQ-9 Total Score 12   Will anti-depressants be prescribed? If yes, complete the mood disorder questionnaire No        No flowsheet data found.    Fall risk - Encounter Level              HEDIS 06/14/2017   Hgb A1C -   Lipid Management  -   Nephropathy Screening -   Systolic blood pressure  3074F - Systolic blood pressure <16330mm Hg   Diastolic blood pressure 3079F - Diastolic blood pressure 80-1489mm Hg   Documentation in record  1157F - Advanced Care Planning documentation in record   Discussion documented in record 1158F - Advanced Care Planning discussion documented in record   Medication list documented 1159F - Medication list documented in medical record   Medication reviewed and reconciled 1160F - Medication review and reconciliation   Functional Status Assessment 1170F - Functional Status Assessment (ADL's, IADL's, Ambulation Status, Cognitive Status)   Eating Feeds self completely.   Continence: Some bowel and/or bladder control.   Bathing: Bathes or showers without help.   Dressing: Dresses self completely.   Toileting: Uses toilet by self.   Transferring: Is able to move in and out of bed or chair.   Home Maintenance Some Assistance   Cooking Some Conservator, museum/galleryAssistance   Laundry Independent   Telephone Independent   Finances Independent   Shopping Some Assistance   Medications Some Assistance   Transport Total Assistance   Ambulation status Independent   Cognitive status:  The patient  does not have dementia   Depression screening 3725F - Screening for Depression   During the past month the patient has often been bothered by: treating   Feeling down, depressed, or hopeless? No   Loss of interest or pleasure in doing things? No   Pain screening 1126F - Pain screening quantified.  No pain issues are present.

## 2017-06-14 NOTE — Progress Notes
Zoster Vaccine (1 of 2) 05/25/2004 ---    Chest CT / LDCT 05/25/2009 ---    Lipid Profile 09/22/2011 09/22/2010    Basic Metabolic Panel 04/22/2015 04/21/2014, 01/16/2013, 01/11/2013    DTaP,Tdap,and Td Vaccines (1 - Tdap) 06/13/2018 (Originally 05/25/1973) ---    Preventive Wellness Visit 06/14/2018 06/14/2017, 06/14/2017 (Done), 07/08/2014    Breast Cancer Screening 06/15/2019 06/14/2017 (Declined), 10/01/2010, 08/09/2010 (Done)    Colon Cancer Screening 05/16/2022 05/16/2012, 09/08/2010          Health Maintenance Topics with due status: Completed       Topic Last Completion Date    Influenza Vaccine 05/08/2017     Health Maintenance Topics with due status: Excluded       Topic Date Due    USPSTF HIV Risk Assessment Excluded       Preventive Health Counseling    Age appropriate anticipatory guidelines discussed with the patient.    Nutrition: Stressed importance of moderation in sodium/caffeine intake, saturated fat and cholesterol, caloric balance, sufficient intake of fresh fruits, vegetables, fiber, calcium, and iron.  Exercise: Stressed the importance of regular exercise.   Substance Abuse: Discussed cessation/primary prevention of tobacco, alcohol, or other drug use; driving or other dangerous activities under the influence; availability of treatment for abuse.   Sexuality: Discussed sexually transmitted diseases, partner selection, and use of condoms.  Dental health: Discussed importance of regular tooth brushing, flossing, and dental visits.  End of Life:  Voluntary advance care planning was held with patient and/or surrogates about creating/reviewing their advance directive. An advance directive is a document appointing an agent and/or recording the wishes of a patient pertaining to his/her medical treatment at a future tie should he/she lack decisional capacity at that time.    The patient verbalized understanding of the above assessments and treatment and agrees to follow through.

## 2017-06-14 NOTE — Progress Notes
R53.83  VITAMIN B12      FOLATE      CBC AND DIFFERENTIAL      VITAMIN B12      FOLATE   6. Need for hepatitis C screening test Z11.59 V73.89 HEPATITIS C ANTIBODY      HEPATITIS C ANTIBODY   7. Vitamin D deficiency E55.9 268.9 25-HYDROXY VITAMIN D      25-HYDROXY VITAMIN D   8. Personal history of tobacco use, presenting hazards to health Z87.891 V15.82    9. Tinea corporis B35.4 110.5 terbinafine (lamISIL) 250 MG PO Tablet     Plan:     Orders Placed This Encounter   Medications   ? terbinafine (lamISIL) 250 MG PO Tablet     Sig: Take 1 tablet by mouth daily for 14 days.     Dispense:  14 tablet     Refill:  0     Orders Placed This Encounter   Procedures   ? COMPREHENSIVE METABOLIC PANEL   ? LIPID PANEL   ? VITAMIN B12   ? FOLATE   ? HEMOGLOBIN A1C W/EAG ([facility])   ? Microalbumin/Creat Urine Ratio   ? HEPATITIS C ANTIBODY   ? 25-HYDROXY VITAMIN D     -BP at goal-continue 5mg   -BW ordered and f/u after  -Pt declined tdap/mammogram  -rx as above for tinea as patient failed topical therapy    Health Maintenance was reviewed. The patient's HM Topic list was:                                            Health Maintenance   Topic Date Due   ? USPSTF Hepatitis C Screening  19-Jan-1956   ? Zoster Vaccine (1 of 2) 05/25/2004   ? Chest CT / LDCT  05/25/2009   ? Lipid Profile  09/22/2011   ? Basic Metabolic Panel  04/22/2015   ? DTaP,Tdap,and Td Vaccines (1 - Tdap) 06/13/2018 (Originally 05/25/1973)   ? Preventive Wellness Visit  06/14/2018   ? Breast Cancer Screening  06/15/2019   ? Colon Cancer Screening  05/16/2022   ? Influenza Vaccine  Completed   ? USPSTF HIV Risk Assessment  Excluded       Subjective:   Tina Key is a 61 y.o. female here for her PHYSICAL EXAM      Patient Care Team:  [name], [name], ARNP as PCP - General (Family Medicine)  Danton SewerStewart, Eric B, MD as PCP - Supervising PCP (Family Medicine)     Past Medical History:   Diagnosis Date   ? Abscess of abdominal cavity (CMS-HCC code)

## 2017-06-14 NOTE — Progress Notes
USPSTF Hepatitis C Screening 11/15/1955 ---    Zoster Vaccine (1 of 2) 05/25/2004 ---    Chest CT / LDCT 05/25/2009 ---    Lipid Profile 09/22/2011 09/22/2010    Basic Metabolic Panel 04/22/2015 04/21/2014, 01/16/2013, 01/11/2013    DTaP,Tdap,and Td Vaccines (1 - Tdap) 06/13/2018 (Originally 05/25/1973) ---    Preventive Wellness Visit 06/14/2018 06/14/2017, 06/14/2017 (Done), 07/08/2014    Breast Cancer Screening 06/15/2019 06/14/2017 (Declined), 10/01/2010, 08/09/2010 (Done)    Colon Cancer Screening 05/16/2022 05/16/2012, 09/08/2010          Health Maintenance Topics with due status: Completed       Topic Last Completion Date    Influenza Vaccine 05/08/2017     Health Maintenance Topics with due status: Excluded       Topic Date Due    USPSTF HIV Risk Assessment Excluded       Preventive Health Counseling    Age appropriate anticipatory guidelines discussed with the patient.    Nutrition: Stressed importance of moderation in sodium/caffeine intake, saturated fat and cholesterol, caloric balance, sufficient intake of fresh fruits, vegetables, fiber, calcium, and iron.  Exercise: Stressed the importance of regular exercise.   Substance Abuse: Discussed cessation/primary prevention of tobacco, alcohol, or other drug use; driving or other dangerous activities under the influence; availability of treatment for abuse.   Sexuality: Discussed sexually transmitted diseases, partner selection, and use of condoms.  Dental health: Discussed importance of regular tooth brushing, flossing, and dental visits.  End of Life:  Voluntary advance care planning was held with patient and/or surrogates about creating/reviewing their advance directive. An advance directive is a document appointing an agent and/or recording the wishes of a patient pertaining to his/her medical treatment at a future tie should he/she lack decisional capacity at that time.    The patient verbalized understanding of the above assessments and

## 2017-06-14 NOTE — Progress Notes
Past Medical History:   Diagnosis Date   ? Abscess of abdominal cavity (CMS-HCC code)    ? Anemia    ? Anxiety    ? Anxiety disorder    ? Bladder incontinence    ? Chronic pain    ? Chronic skin ulcer (CMS-HCC code)    ? COPD (chronic obstructive pulmonary disease) (CMS-HCC code)    ? Depression    ? Easy bruising    ? Hiatal hernia    ? History of cervical cancer    ? History of diverticulitis of colon    ? Hormone disorder    ? HTN (hypertension)    ? Hyperlipidemia    ? Insomnia    ? Irregular heart beat    ? Kidney disease    ? OSA (obstructive sleep apnea)    ? Small bowel obstruction (CMS-HCC code)    ? Snoring disorder      Past Surgical History:   Procedure Laterality Date   ? COLECTOMY       Partial Colectomy; laparoscopic sigmoid colectomy (08/19/11)    ? COLONOSCOPY     ? ESOPHAGOGASTROSCOPY Left 01/01/2013    ESOPHAGOGASTROSCOPY performed by Noel GeroldEid, Amalie F, MD at [facility]   ? HYSTERECTOMY      complete due to cervical cancer      Family History   Problem Relation Age of Onset   ? Heart Disease Other         none   ? High Blood Pressure Mother    ? Depression Mother    ? No Known Problems Father    ? No Known Problems Sister    ? No Known Problems Brother    ? No Known Problems Maternal Grandmother    ? No Known Problems Maternal Grandfather    ? No Known Problems Paternal Grandmother    ? No Known Problems Paternal Grandfather    ? No Known Problems Maternal Aunt    ? No Known Problems Maternal Uncle    ? No Known Problems Paternal Aunt    ? No Known Problems Paternal Uncle      Social History     Social History   ? Marital status: Widowed     Spouse name: N/A   ? Number of children: N/A   ? Years of education: N/A     Occupational History   ? Not on file.     Social History Main Topics   ? Smoking status: Former Smoker     Packs/day: 1.00     Years: 30.00     Types: Cigarettes     Start date: 08/09/1982     Quit date: 07/11/2012   ? Smokeless tobacco: Never Used   ? Alcohol use No

## 2017-06-14 NOTE — Progress Notes
PM Dillon BjorkMahan, Porsche A, MA 10/13/15 -           Assessment:       ICD-10-CM ICD-9-CM    1. Routine general medical examination at a health care facility Z00.00 V70.0    2. HTN, goal below 140/80 I10 401.9 Microalbumin/Creat Urine Ratio      Microalbumin/Creat Urine Ratio   3. Dyslipidemia E78.5 272.4 LIPID PANEL      LIPID PANEL   4. Impaired fasting glucose R73.01 790.21 COMPREHENSIVE METABOLIC PANEL      HEMOGLOBIN A1C W/EAG ([facility])      COMPREHENSIVE METABOLIC PANEL      HEMOGLOBIN A1C W/EAG ([facility])   5. Malaise and fatigue R53.81 780.79 CBC AND DIFFERENTIAL    R53.83  VITAMIN B12      FOLATE      CBC AND DIFFERENTIAL      VITAMIN B12      FOLATE   6. Need for hepatitis C screening test Z11.59 V73.89 HEPATITIS C ANTIBODY      HEPATITIS C ANTIBODY   7. Vitamin D deficiency E55.9 268.9 25-HYDROXY VITAMIN D      25-HYDROXY VITAMIN D   8. Personal history of tobacco use, presenting hazards to health Z87.891 V15.82    9. Tinea corporis B35.4 110.5 terbinafine (lamISIL) 250 MG PO Tablet     Medicare AWV completed.      Plan:     During the exam the following risks were identified and plans for these risks are discussed below:  Noted in HPI    Orders Placed This Encounter   Medications   ? terbinafine (lamISIL) 250 MG PO Tablet     Sig: Take 1 tablet by mouth daily for 14 days.     Dispense:  14 tablet     Refill:  0     Orders Placed This Encounter   Procedures   ? COMPREHENSIVE METABOLIC PANEL   ? LIPID PANEL   ? VITAMIN B12   ? FOLATE   ? HEMOGLOBIN A1C W/EAG ([facility])   ? Microalbumin/Creat Urine Ratio   ? HEPATITIS C ANTIBODY   ? 25-HYDROXY VITAMIN D     No orders of the following type were placed in this encounter: Referrals      During the course of the visit the patient was educated and counseled about appropriate screening and preventive services as follows:  Health Maintenance Reminders       Due Completion Dates    USPSTF Hepatitis C Screening May 11, 1956 ---

## 2017-06-14 NOTE — Progress Notes
Diagnosis Date   ? Abscess of abdominal cavity (CMS-HCC code)    ? Anemia    ? Anxiety    ? Anxiety disorder    ? Bladder incontinence    ? Chronic pain    ? Chronic skin ulcer (CMS-HCC code)    ? COPD (chronic obstructive pulmonary disease) (CMS-HCC code)    ? Depression    ? Easy bruising    ? Hiatal hernia    ? History of cervical cancer    ? History of diverticulitis of colon    ? Hormone disorder    ? HTN (hypertension)    ? Hyperlipidemia    ? Insomnia    ? Irregular heart beat    ? Kidney disease    ? OSA (obstructive sleep apnea)    ? Small bowel obstruction (CMS-HCC code)    ? Snoring disorder      Past Surgical History:   Procedure Laterality Date   ? COLECTOMY       Partial Colectomy; laparoscopic sigmoid colectomy (08/19/11)    ? COLONOSCOPY     ? ESOPHAGOGASTROSCOPY Left 01/01/2013    ESOPHAGOGASTROSCOPY performed by Noel GeroldEid, Amalie F, MD at [facility]   ? HYSTERECTOMY      complete due to cervical cancer      Family History   Problem Relation Age of Onset   ? Heart Disease Other         none   ? High Blood Pressure Mother    ? Depression Mother    ? No Known Problems Father    ? No Known Problems Sister    ? No Known Problems Brother    ? No Known Problems Maternal Grandmother    ? No Known Problems Maternal Grandfather    ? No Known Problems Paternal Grandmother    ? No Known Problems Paternal Grandfather    ? No Known Problems Maternal Aunt    ? No Known Problems Maternal Uncle    ? No Known Problems Paternal Aunt    ? No Known Problems Paternal Uncle      Social History   Substance Use Topics   ? Smoking status: Former Smoker     Packs/day: 1.00     Years: 30.00     Types: Cigarettes     Start date: 08/09/1982     Quit date: 07/11/2012   ? Smokeless tobacco: Never Used   ? Alcohol use No      Comment: 2cans of beer a week     Current Outpatient Prescriptions on File Prior to Visit   Medication Sig   ? atorvastatin (LIPITOR) 20 MG PO Tablet Take 1 tablet by mouth nightly at bedtime.

## 2017-06-14 NOTE — Progress Notes
treatment and agrees to follow through.     Additional screenings/counseling discussed with patient but declined include:   Digital rectal exam (DRE), Flexible Sigmoidoscopy and Pelvic Exam.  I have discussed the increased risk of morbidity and death from their decision.  Patient verbally explained and repeated back increased risks of adverse health consequences as a result of their refusal of scheduling or not completing these diagnostic screenings.     After visit summary provided with screening schedule, treatment options and education for established conditions or risk factors, and personized health advice.

## 2017-06-14 NOTE — Progress Notes
screening round), radiation exposure (1.5 mSv per scan), over-diagnosis rate (about 10%).    The patient knows about the possible results of a screening CT scan for lung cancer:  Positive findings: The patient knows that the low dose CT for lung cancer screening, alone, cannot completely diagnose lung cancer. A scan that identifies a nodule, a finding suspicious for lung cancer or other incidental finding may require additional diagnostic tests or procedures.  Negative findings: A single negative scan means that the high risk of having lung cancer at the time is low, but not zero. To reduce the risk of lung cancer death, in the near future, high risk individuals should receive annual scans until the age of 63, the CMS standard is now 63 years old, or if another health problem supersedes lung cancer screening. We emphasized the importance of adherence to an annual low dose CT scan for lung cancer screening. We discussed that even lung cancer screening, with annual low dose CT scan can miss some lung cancers.    We discussed that the patient should receive lung cancer screening only if she is physically able to undergo lung cancer treatment, like surgery. In other words, if the patient has comorbidities that prevent the patient from undergoing lung cancer treatment, she should not undergo lung cancer screening.      -pt denies any other acute complaints    From Previous Visit:  -PT presents to clinic today for f/u HTN and rash-see below. Pt reports since starting lisinopril BP has been improved but, decreases to <90/60 with metoprolol together and pt is symptomatic. PT brings with her the BP journal for review. Pt with at goal BP in office. Discussion followed  -Pt reports no real improvement to below rash with atarax-does state some improvement in anxiety but, feels it could be increased just a bit. Pt denies any adverse SEs to medication. Denies any current homicidal/suicidal ideations. Discussion followed

## 2017-06-14 NOTE — Progress Notes
R53.83  VITAMIN B12      FOLATE      CBC AND DIFFERENTIAL      VITAMIN B12      FOLATE   6. Need for hepatitis C screening test Z11.59 V73.89 HEPATITIS C ANTIBODY      HEPATITIS C ANTIBODY   7. Vitamin D deficiency E55.9 268.9 25-HYDROXY VITAMIN D      25-HYDROXY VITAMIN D   8. Personal history of tobacco use, presenting hazards to health Z87.891 V15.82    9. Tinea corporis B35.4 110.5 terbinafine (lamISIL) 250 MG PO Tablet     Plan:     Orders Placed This Encounter   Medications   ? terbinafine (lamISIL) 250 MG PO Tablet     Sig: Take 1 tablet by mouth daily for 14 days.     Dispense:  14 tablet     Refill:  0     Orders Placed This Encounter   Procedures   ? COMPREHENSIVE METABOLIC PANEL   ? LIPID PANEL   ? VITAMIN B12   ? FOLATE   ? HEMOGLOBIN A1C W/EAG ([facility])   ? Microalbumin/Creat Urine Ratio   ? HEPATITIS C ANTIBODY   ? 25-HYDROXY VITAMIN D     -BP at goal-continue 5mg   -BW ordered and f/u after  -Pt declined tdap/mammogram  -rx as above for tinea as patient failed topical therapy  -LDCT ordered  -thyroid US ordered due to assessment finding as above    Health Maintenance was reviewed. The patient's HM Topic list was:                                            Health Maintenance   Topic Date Due   ? USPSTF Hepatitis C Screening  [birth year]   ? Zoster Vaccine (1 of 2) 05/25/2004   ? Chest CT / LDCT  05/25/2009   ? Lipid Profile  09/22/2011   ? Basic Metabolic Panel  04/22/2015   ? DTaP,Tdap,and Td Vaccines (1 - Tdap) 06/13/2018 (Originally 05/25/1975)   ? Preventive Wellness Visit  06/14/2018   ? Breast Cancer Screening  06/15/2019   ? Colon Cancer Screening  05/16/2022   ? Influenza Vaccine  Completed   ? USPSTF HIV Risk Assessment  Excluded       Subjective:   Tina Key is a 61 y.o. female here for her PHYSICAL EXAM      Patient Care Team:  [name], [name], ARNP as PCP - General (Family Medicine)  Danton SewerStewart, Eric B, MD as PCP - Supervising PCP (Family Medicine)     Past Medical History:

## 2017-06-14 NOTE — Progress Notes
Comment: 2cans of beer a week   ? Drug use: No   ? Sexual activity: No     Other Topics Concern   ? Not on file     Social History Narrative   ? No narrative on file     Current Outpatient Prescriptions on File Prior to Visit   Medication Sig   ? atorvastatin (LIPITOR) 20 MG PO Tablet Take 1 tablet by mouth nightly at bedtime.   ? diazePAM (VALIUM) 2 MG PO Tablet Take 1 tablet by mouth daily as needed for anxiety or sleep. Last filled 03/11/2017   ? diclofenac (VOLTAREN GEL) 1 % TD Gel Apply to affected area   ? [DISCONTINUED] hydroCHLOROthiazide (HYDRODIURIL) 12.5 MG PO Tablet TAKE ONE TABLET BY MOUTH EVERY DAY   ? hydrOXYzine HCl (ATARAX) 50 MG PO Tablet Take 1 tablet by mouth 2 times daily as needed for itching or anxiety.   ? ketoconazole (NIZORAL) 2 % EX Cream Use topically daily.   ? lisinopril (PRINIVIL,ZESTRIL) 10 MG PO Tablet Take 1 tablet by mouth daily.   ? montelukast (SINGULAIR) 10 MG PO Tablet Take 1 tablet by mouth nightly at bedtime.   ? QUEtiapine (SEROquel) 25 MG PO Tablet Take 1 tablet by mouth 2 times daily.   ? rOPINIRole (REQUIP) 2 MG PO Tablet Take 1 tablet by mouth nightly at bedtime.     No current facility-administered medications on file prior to visit.      Allergies   Allergen Reactions   ? Seasonal Allergies Other (See Comments)     Not listed    ? Soma [Carisoprodol] Other (See Comments)     " makes me drunk"       Review of Systems  Review of Systems   Constitutional: Positive for fatigue. Negative for fever.   Eyes: Negative for visual disturbance.   Respiratory: Negative for shortness of breath.    Cardiovascular: Negative for chest pain.   Musculoskeletal: Positive for neck pain.        R foot pain-dorsal surface-continues   Skin: Positive for rash.        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 The patient is not nervous/anxious.         Improved with med adjustment

## 2017-06-14 NOTE — Progress Notes
Has patient fallen or had problems with walking or balance in the past year? 1101F - No falls in past year or 1 fall with no injury   Smoking Status 1033F - Current non-smoker   Tobacco Cessation:  4004F - Tobacco screening and tobacco cessation intervention (counseling and/or pharmacotherapy)   Patient who had an inpatient admission should be seen within 30 days of discharge and their medications should be reviewed, including a list of current medications. -   Select if you assessed the patient for Urinary Incontinence 1090F - Urinary Incontinence Assessment       Objective:      No flowsheet data found.        VITAL SIGNS (all recorded)      Clinic Vitals     Row Name 06/14/17 1319                Amb Encounter Vitals    Weight 76.2 kg (168 lb)    -PM at 06/14/17 1320       Height 1.549 m (5\' 1" )    -PM at 06/14/17 1320       BMI (Calculated) 31.81    -PM at 06/14/17 1320       BSA (Calculated - sq m) 1.81    -PM at 06/14/17 1320       BP 118/86    -PM at 06/14/17 1320       BP Location Left upper arm    -PM at 06/14/17 1320       Position Sitting    -PM at 06/14/17 1320       Pulse 97    -PM at 06/14/17 1320       Pulse Source Monitor    -PM at 06/14/17 1320       Pulse Quality Normal    -PM at 06/14/17 1320       Resp 16    -PM at 06/14/17 1320       Respiration Quality Normal    -PM at 06/14/17 1320       Temp 36.4 ?C (97.5 ?F)    -PM at 06/14/17 1320       Temperature Source Oral    -PM at 06/14/17 1320       O2 Saturation 97 %    -PM at 06/14/17 1320       Pain Score Zero    -PM at 06/14/17 1320       Last Menstrual Period ?   hysterectomy    -PM at 06/14/17 1320          Education/Communication Barriers?    Learning/Communication Barriers? No    -PM at 06/14/17 1320          Fall Risk Assessment    Had recent fall / Last 6 months? No recent fall    -PM at 06/14/17 1320       Does patient have a fear of falling? No    -PM at 06/14/17 1320         User Key  (r) = Recorded By, (t) = Taken By, (c) = Cosigned By

## 2017-06-14 NOTE — Progress Notes
Initials Name Effective Dates    PM Ashok CordiaMahan, Porsche A, [place] 10/13/15 -           Assessment:       ICD-10-CM ICD-9-CM    1. Routine general medical examination at a health care facility Z00.00 V70.0    2. HTN, goal below 140/80 I10 401.9 Microalbumin/Creat Urine Ratio      Microalbumin/Creat Urine Ratio   3. Dyslipidemia E78.5 272.4 LIPID PANEL      LIPID PANEL   4. Impaired fasting glucose R73.01 790.21 COMPREHENSIVE METABOLIC PANEL      HEMOGLOBIN A1C W/EAG ([facility])      COMPREHENSIVE METABOLIC PANEL      HEMOGLOBIN A1C W/EAG ([facility])   5. Malaise and fatigue R53.81 780.79 CBC AND DIFFERENTIAL    R53.83  VITAMIN B12      FOLATE      CBC AND DIFFERENTIAL      VITAMIN B12      FOLATE   6. Need for hepatitis C screening test Z11.59 V73.89 HEPATITIS C ANTIBODY      HEPATITIS C ANTIBODY   7. Vitamin D deficiency E55.9 268.9 25-HYDROXY VITAMIN D      25-HYDROXY VITAMIN D   8. Personal history of tobacco use, presenting hazards to health Z87.891 V15.82    9. Tinea corporis B35.4 110.5 terbinafine (lamISIL) 250 MG PO Tablet     Medicare AWV completed.      Plan:     During the exam the following risks were identified and plans for these risks are discussed below:  Noted in HPI    Orders Placed This Encounter   Medications   ? terbinafine (lamISIL) 250 MG PO Tablet     Sig: Take 1 tablet by mouth daily for 14 days.     Dispense:  14 tablet     Refill:  0     Orders Placed This Encounter   Procedures   ? COMPREHENSIVE METABOLIC PANEL   ? LIPID PANEL   ? VITAMIN B12   ? FOLATE   ? HEMOGLOBIN A1C W/EAG ([facility])   ? Microalbumin/Creat Urine Ratio   ? HEPATITIS C ANTIBODY   ? 25-HYDROXY VITAMIN D     No orders of the following type were placed in this encounter: Referrals      During the course of the visit the patient was educated and counseled about appropriate screening and preventive services as follows:  Health Maintenance Reminders       Due Completion Dates

## 2017-06-14 NOTE — Progress Notes
?   diazePAM (VALIUM) 2 MG PO Tablet Take 1 tablet by mouth daily as needed for anxiety or sleep. Last filled 03/11/2017   ? diclofenac (VOLTAREN GEL) 1 % TD Gel Apply to affected area   ? [DISCONTINUED] hydroCHLOROthiazide (HYDRODIURIL) 12.5 MG PO Tablet TAKE ONE TABLET BY MOUTH EVERY DAY   ? hydrOXYzine HCl (ATARAX) 50 MG PO Tablet Take 1 tablet by mouth 2 times daily as needed for itching or anxiety.   ? ketoconazole (NIZORAL) 2 % EX Cream Use topically daily.   ? lisinopril (PRINIVIL,ZESTRIL) 10 MG PO Tablet Take 1 tablet by mouth daily.   ? montelukast (SINGULAIR) 10 MG PO Tablet Take 1 tablet by mouth nightly at bedtime.   ? QUEtiapine (SEROquel) 25 MG PO Tablet Take 1 tablet by mouth 2 times daily.   ? rOPINIRole (REQUIP) 2 MG PO Tablet Take 1 tablet by mouth nightly at bedtime.     No current facility-administered medications on file prior to visit.      Allergies   Allergen Reactions   ? Seasonal Allergies Other (See Comments)     Not listed    ? Soma [Carisoprodol] Other (See Comments)     " makes me drunk"         Health Risk Assessment  General Care Management       Exercise:  currently not exercising         Social Determinates of Health  Financial Resource Strain  No Data Recorded  Housing Situation  No Data Recorded  Food Insecurity  No Data Recorded  Transportation Needs  No Data Recorded  Physical Activity  No Data Recorded  Stress  No Data Recorded  Social Connections  No Data Recorded  Intimate Partner Violence   No Data Recorded    Functional Ability Assessment 06/14/2017   Eating Feeds self completely.   Continence: Some bowel and/or bladder control.   Bathing: Bathes or showers without help.   Dressing: Dresses self completely.   Toileting: Uses toilet by self.   Transferring: Is able to move in and out of bed or chair.   Home Maintenance Some Assistance   Cooking Some Assistance   Laundry Independent   Telephone Independent   Finances Independent   Medications Some Assistance

## 2017-06-14 NOTE — Progress Notes
I agree with the plan and assessment as above, no teaching statement needed.

## 2017-06-14 NOTE — Progress Notes
year? 1101F - No falls in past year or 1 fall with no injury   Smoking Status 1033F - Current non-smoker   Tobacco Cessation:  4004F - Tobacco screening and tobacco cessation intervention (counseling and/or pharmacotherapy)   Patient who had an inpatient admission should be seen within 30 days of discharge and their medications should be reviewed, including a list of current medications. -   Select if you assessed the patient for Urinary Incontinence 1090F - Urinary Incontinence Assessment       Objective:      No flowsheet data found.        VITAL SIGNS (all recorded)      Clinic Vitals     Row Name 06/14/17 1319                Amb Encounter Vitals    Weight 76.2 kg (168 lb)    -PM at 06/14/17 1320       Height 1.549 m (5\' 1" )    -PM at 06/14/17 1320       BMI (Calculated) 31.81    -PM at 06/14/17 1320       BSA (Calculated - sq m) 1.81    -PM at 06/14/17 1320       BP 118/86    -PM at 06/14/17 1320       BP Location Left upper arm    -PM at 06/14/17 1320       Position Sitting    -PM at 06/14/17 1320       Pulse 97    -PM at 06/14/17 1320       Pulse Source Monitor    -PM at 06/14/17 1320       Pulse Quality Normal    -PM at 06/14/17 1320       Resp 16    -PM at 06/14/17 1320       Respiration Quality Normal    -PM at 06/14/17 1320       Temp 36.4 ?C (97.5 ?F)    -PM at 06/14/17 1320       Temperature Source Oral    -PM at 06/14/17 1320       O2 Saturation 97 %    -PM at 06/14/17 1320       Pain Score Zero    -PM at 06/14/17 1320       Last Menstrual Period ?   hysterectomy    -PM at 06/14/17 1320          Education/Communication Barriers?    Learning/Communication Barriers? No    -PM at 06/14/17 1320          Fall Risk Assessment    Had recent fall / Last 6 months? No recent fall    -PM at 06/14/17 1320       Does patient have a fear of falling? No    -PM at 06/14/17 1320         User Key  (r) = Recorded By, (t) = Taken By, (c) = Cosigned By    Initials Name Effective Dates

## 2017-07-06 ENCOUNTER — Encounter: Primary: Nurse Practitioner

## 2017-07-08 DIAGNOSIS — I10 Essential (primary) hypertension: Principal | ICD-10-CM

## 2017-07-09 MED ORDER — HYDROCHLOROTHIAZIDE 12.5 MG PO TABS
0 refills | Status: CP
Start: 2017-07-09 — End: 2017-11-09

## 2017-07-20 ENCOUNTER — Encounter: Attending: Nurse Practitioner | Primary: Nurse Practitioner

## 2017-07-21 ENCOUNTER — Ambulatory Visit: Attending: Nurse Practitioner | Primary: Nurse Practitioner

## 2017-07-21 DIAGNOSIS — E559 Vitamin D deficiency, unspecified: Principal | ICD-10-CM

## 2017-07-21 DIAGNOSIS — R7301 Impaired fasting glucose: Secondary | ICD-10-CM

## 2017-07-21 DIAGNOSIS — R5383 Other fatigue: Secondary | ICD-10-CM

## 2017-07-21 DIAGNOSIS — E785 Hyperlipidemia, unspecified: Secondary | ICD-10-CM

## 2017-07-21 DIAGNOSIS — R5381 Other malaise: Secondary | ICD-10-CM

## 2017-07-21 NOTE — Progress Notes
Reason:  Physician ordered labs  Amount:  4 Tubes  Type:  Vaccutainer  Site:  Vein  right arm  Reaction:  None    Draw performed by:  [id number],  07/21/2017, 10:50am          LAVENDER:1  GOLD:  TIGER:1  RED:2  TRANSPORT TUBE (PLASMA):1  URINE CUP:

## 2017-07-21 NOTE — Progress Notes
I agree with the plan and assessment as above, no teaching statement needed.

## 2017-08-04 ENCOUNTER — Ambulatory Visit: Attending: Nurse Practitioner | Primary: Nurse Practitioner

## 2017-08-04 DIAGNOSIS — R7303 Prediabetes: Secondary | ICD-10-CM

## 2017-08-04 DIAGNOSIS — J449 Chronic obstructive pulmonary disease, unspecified: Secondary | ICD-10-CM

## 2017-08-04 DIAGNOSIS — N289 Disorder of kidney and ureter, unspecified: Principal | ICD-10-CM

## 2017-08-04 DIAGNOSIS — E6609 Other obesity due to excess calories: Secondary | ICD-10-CM

## 2017-08-04 DIAGNOSIS — G4733 Obstructive sleep apnea (adult) (pediatric): Secondary | ICD-10-CM

## 2017-08-04 DIAGNOSIS — R0683 Snoring: Secondary | ICD-10-CM

## 2017-08-04 DIAGNOSIS — I10 Essential (primary) hypertension: Secondary | ICD-10-CM

## 2017-08-04 DIAGNOSIS — Z8541 Personal history of malignant neoplasm of cervix uteri: Secondary | ICD-10-CM

## 2017-08-04 DIAGNOSIS — F329 Major depressive disorder, single episode, unspecified: Secondary | ICD-10-CM

## 2017-08-04 DIAGNOSIS — R238 Other skin changes: Secondary | ICD-10-CM

## 2017-08-04 DIAGNOSIS — L98499 Non-pressure chronic ulcer of skin of other sites with unspecified severity: Secondary | ICD-10-CM

## 2017-08-04 DIAGNOSIS — F419 Anxiety disorder, unspecified: Secondary | ICD-10-CM

## 2017-08-04 DIAGNOSIS — D649 Anemia, unspecified: Secondary | ICD-10-CM

## 2017-08-04 DIAGNOSIS — K651 Peritoneal abscess: Secondary | ICD-10-CM

## 2017-08-04 DIAGNOSIS — E785 Hyperlipidemia, unspecified: Secondary | ICD-10-CM

## 2017-08-04 DIAGNOSIS — G4700 Insomnia, unspecified: Secondary | ICD-10-CM

## 2017-08-04 DIAGNOSIS — I499 Cardiac arrhythmia, unspecified: Secondary | ICD-10-CM

## 2017-08-04 DIAGNOSIS — E049 Nontoxic goiter, unspecified: Secondary | ICD-10-CM

## 2017-08-04 DIAGNOSIS — E538 Deficiency of other specified B group vitamins: Secondary | ICD-10-CM

## 2017-08-04 DIAGNOSIS — R32 Unspecified urinary incontinence: Secondary | ICD-10-CM

## 2017-08-04 DIAGNOSIS — F411 Generalized anxiety disorder: Secondary | ICD-10-CM

## 2017-08-04 DIAGNOSIS — K449 Diaphragmatic hernia without obstruction or gangrene: Secondary | ICD-10-CM

## 2017-08-04 DIAGNOSIS — J45909 Unspecified asthma, uncomplicated: Secondary | ICD-10-CM

## 2017-08-04 DIAGNOSIS — Z6831 Body mass index (BMI) 31.0-31.9, adult: Secondary | ICD-10-CM

## 2017-08-04 DIAGNOSIS — Z8719 Personal history of other diseases of the digestive system: Secondary | ICD-10-CM

## 2017-08-04 DIAGNOSIS — G8929 Other chronic pain: Secondary | ICD-10-CM

## 2017-08-04 DIAGNOSIS — R7301 Impaired fasting glucose: Secondary | ICD-10-CM

## 2017-08-04 DIAGNOSIS — E349 Endocrine disorder, unspecified: Secondary | ICD-10-CM

## 2017-08-04 DIAGNOSIS — E559 Vitamin D deficiency, unspecified: Secondary | ICD-10-CM

## 2017-08-04 DIAGNOSIS — K56609 Unspecified intestinal obstruction, unspecified as to partial versus complete obstruction: Secondary | ICD-10-CM

## 2017-08-04 MED ORDER — DIAZEPAM 2 MG PO TABS
2 mg | Freq: Every day | ORAL | 0 refills | Status: CP | PRN
Start: 2017-08-04 — End: 2017-10-23

## 2017-08-04 NOTE — Patient Instructions
you should work with someone who is trained in how food affects health (dietitian). Foods that contain vitamin B12 include:  ? Meat.  ? Meat from birds (poultry).  ? Fish.  ? Eggs.  ? Cereal and dairy products that are fortified. This means that vitamin B12 has been added to the food. Check the label on the package to see if the food is fortified.  ? Do not drink too much (do not abuse) alcohol.  ? Keep all follow-up visits as told by your doctor. This is important.  Contact a doctor if:  ? Your symptoms come back.  Get help right away if:  ? You have trouble breathing.  ? You have chest pain.  ? You get dizzy.  ? You pass out (lose consciousness).  This information is not intended to replace advice given to you by your health care provider. Make sure you discuss any questions you have with your health care provider.  Document Released: 06/16/2011 Document Revised: 12/03/2015 Document Reviewed: 11/12/2014  Elsevier Interactive Patient Education ? 2018 Elsevier Inc.      Vitamin D Deficiency  Vitamin D deficiency is when your body does not have enough vitamin D. Vitamin D is important because:  ? It helps your body use other minerals that your body needs.  ? It helps keep your bones strong and healthy.  ? It may help to prevent some diseases.  ? It helps your heart and other muscles work well.    You can get vitamin D by:  ? Eating foods with vitamin D in them.  ? Drinking or eating milk or other foods that have had vitamin D added to them.  ? Taking a vitamin D supplement.  ? Being in the sun.    Not getting enough vitamin D can make your bones become soft. It can also cause other health problems.  Follow these instructions at home:  ? Take medicines and supplements only as told by your doctor.  ? Eat foods that have vitamin D. These include:  ? Dairy products, cereals, or juices with added vitamin D. Check the label for vitamin D.  ? Fatty fish like salmon or trout.  ? Eggs.  ? Oysters.  ? Do not use tanning beds.

## 2017-08-04 NOTE — Progress Notes
I agree with the plan and assessment as above, no teaching statement needed.

## 2017-08-04 NOTE — Patient Instructions
Prediabetes Eating Plan  Prediabetes?also called impaired glucose tolerance or impaired fasting glucose?is a condition that causes blood sugar (blood glucose) levels to be higher than normal. Following a healthy diet can help to keep prediabetes under control. It can also help to lower the risk of type 2 diabetes and heart disease, which are increased in people who have prediabetes. Along with regular exercise, a healthy diet:  ? Promotes weight loss.  ? Helps to control blood sugar levels.  ? Helps to improve the way that the body uses insulin.    What do I need to know about this eating plan?  ? Use the glycemic index (GI) to plan your meals. The index tells you how quickly a food will raise your blood sugar. Choose low-GI foods. These foods take a longer time to raise blood sugar.  ? Pay close attention to the amount of carbohydrates in the food that you eat. Carbohydrates increase blood sugar levels.  ? Keep track of how many calories you take in. Eating the right amount of calories will help you to achieve a healthy weight. Losing about 7 percent of your starting weight can help to prevent type 2 diabetes.  ? You may want to follow a Mediterranean diet. This diet includes a lot of vegetables, lean meats or fish, whole grains, fruits, and healthy oils and fats.  What foods can I eat?  Grains  Whole grains, such as whole-wheat or whole-grain breads, crackers, cereals, and pasta. Unsweetened oatmeal. Bulgur. Barley. Quinoa. Brown rice. Corn or whole-wheat flour tortillas or taco shells.  Vegetables  Lettuce. Spinach. Peas. Beets. Cauliflower. Cabbage. Broccoli. Carrots. Tomatoes. Squash. Eggplant. Herbs. Peppers. Onions. Cucumbers. Brussels sprouts.  Fruits  Berries. Bananas. Apples. Oranges. Grapes. Papaya. Mango. Pomegranate. Kiwi. Grapefruit. Cherries.  Meats and Other Protein Sources  Seafood. Lean meats, such as chicken and turkey or lean cuts of pork and beef. Tofu. Eggs. Nuts. Beans.  Dairy

## 2017-08-04 NOTE — Patient Instructions
Low-fat or fat-free dairy products, such as yogurt, cottage cheese, and cheese.  Beverages  Water. Tea. Coffee. Sugar-free or diet soda. Seltzer water. Milk. Milk alternatives, such as soy or almond milk.  Condiments  Mustard. Relish. Low-fat, low-sugar ketchup. Low-fat, low-sugar barbecue sauce. Low-fat or fat-free mayonnaise.  Sweets and Desserts  Sugar-free or low-fat pudding. Sugar-free or low-fat ice cream and other frozen treats.  Fats and Oils  Avocado. Walnuts. Olive oil.  The items listed above may not be a complete list of recommended foods or beverages. Contact your dietitian for more options.  What foods are not recommended?  Grains  Refined white flour and flour products, such as bread, pasta, snack foods, and cereals.  Beverages  Sweetened drinks, such as sweet iced tea and soda.  Sweets and Desserts  Baked goods, such as cake, cupcakes, pastries, cookies, and cheesecake.  The items listed above may not be a complete list of foods and beverages to avoid. Contact your dietitian for more information.  This information is not intended to replace advice given to you by your health care provider. Make sure you discuss any questions you have with your health care provider.  Document Released: 11/11/2014 Document Revised: 12/03/2015 Document Reviewed: 07/23/2014  Elsevier Interactive Patient Education ? 2017 Elsevier Inc.      Vitamin B12 Deficiency  Vitamin B12 deficiency means that your body is not getting enough vitamin B12. Your body needs vitamin B12 for important bodily functions. If you do not have enough vitamin B12 in your body, you can have health problems.  Follow these instructions at home:  ? Take supplements only as told by your doctor. Follow the directions carefully.  ? Get any shots (injections) as told by your doctor. Do not miss your visits to the doctor.  ? Eat lots of healthy foods that contain vitamin B12. Ask your doctor if

## 2017-08-04 NOTE — Patient Instructions
?   Stay at a healthy weight. Lose weight, if needed.  ? Keep all follow-up visits as told by your doctor. This is important.  Contact a doctor if:  ? Your symptoms do not go away.  ? You feel sick to your stomach (nauseous).  ? You?throw up (vomit).  ? You poop less often than usual or you have trouble pooping (constipation).  This information is not intended to replace advice given to you by your health care provider. Make sure you discuss any questions you have with your health care provider.  Document Released: 06/16/2011 Document Revised: 12/03/2015 Document Reviewed: 11/12/2014  Elsevier Interactive Patient Education ? 2018 Elsevier Inc.

## 2017-08-04 NOTE — Patient Instructions
Low-fat or fat-free dairy products, such as yogurt, cottage cheese, and cheese.  Beverages  Water. Tea. Coffee. Sugar-free or diet soda. Seltzer water. Milk. Milk alternatives, such as soy or almond milk.  Condiments  Mustard. Relish. Low-fat, low-sugar ketchup. Low-fat, low-sugar barbecue sauce. Low-fat or fat-free mayonnaise.  Sweets and Desserts  Sugar-free or low-fat pudding. Sugar-free or low-fat ice cream and other frozen treats.  Fats and Oils  Avocado. Walnuts. Olive oil.  The items listed above may not be a complete list of recommended foods or beverages. Contact your dietitian for more options.  What foods are not recommended?  Grains  Refined white flour and flour products, such as bread, pasta, snack foods, and cereals.  Beverages  Sweetened drinks, such as sweet iced tea and soda.  Sweets and Desserts  Baked goods, such as cake, cupcakes, pastries, cookies, and cheesecake.  The items listed above may not be a complete list of foods and beverages to avoid. Contact your dietitian for more information.  This information is not intended to replace advice given to you by your health care provider. Make sure you discuss any questions you have with your health care provider.  Document Released: 11/11/2014 Document Revised: 12/03/2015 Document Reviewed: 07/23/2014  Elsevier Interactive Patient Education ? 2017 Elsevier Inc.

## 2017-09-01 ENCOUNTER — Encounter: Primary: Nurse Practitioner

## 2017-09-20 ENCOUNTER — Ambulatory Visit: Attending: Nurse Practitioner | Primary: Nurse Practitioner

## 2017-09-20 DIAGNOSIS — N289 Disorder of kidney and ureter, unspecified: Principal | ICD-10-CM

## 2017-09-20 NOTE — Progress Notes
Reason:  Physician ordered labs  Amount:  1 Tubes  Type:  Butterfly  Site:  Vein  left arm  Reaction:  None    Draw performed by:  [id number],  09/20/2017, 10"05am        LAVENDER:  GOLD:1  TIGER:  RED:  TRANSPORT TUBE (PLASMA):  URINE CUP:

## 2017-09-20 NOTE — Progress Notes
I agree with the plan and assessment as above, no teaching statement needed.

## 2017-09-21 DIAGNOSIS — N289 Disorder of kidney and ureter, unspecified: Principal | ICD-10-CM

## 2017-09-28 ENCOUNTER — Ambulatory Visit: Attending: Nurse Practitioner | Primary: Nurse Practitioner

## 2017-09-28 DIAGNOSIS — Z1211 Encounter for screening for malignant neoplasm of colon: Principal | ICD-10-CM

## 2017-09-28 DIAGNOSIS — N289 Disorder of kidney and ureter, unspecified: Secondary | ICD-10-CM

## 2017-09-28 NOTE — Progress Notes
Pt was in to drop off a Fit test .    Orders Placed This Encounter   Procedures   ? IMMUNOCHEMICAL FECAL OCCULT BLOOD-DIAGNO

## 2017-09-28 NOTE — Addendum Note
Addended by: Riley KillSMITH, HOPE on: 09/28/2017 11:45 AM     Modules accepted: Orders

## 2017-09-28 NOTE — Progress Notes
Pt was in to drop off a  24 hr urine

## 2017-09-28 NOTE — Progress Notes
Pt was in to drop off a Fit test , 24 hr urine    Orders Placed This Encounter   Procedures   ? IMMUNOCHEMICAL FECAL OCCULT BLOOD-DIAGNO   ? CREATININE, 24HR URINE

## 2017-09-28 NOTE — Addendum Note
Addended by: Riley KillSMITH, HOPE on: 09/28/2017 11:44 AM     Modules accepted: Orders

## 2017-09-28 NOTE — Progress Notes
Pt was in to drop off a Fit test .    Orders Placed This Encounter   Procedures   ? IMMUNOCHEMICAL FECAL OCCULT BLOOD-DIAGNO   ? CREATININE, 24HR URINE

## 2017-09-29 DIAGNOSIS — L509 Urticaria, unspecified: Secondary | ICD-10-CM

## 2017-09-29 DIAGNOSIS — F419 Anxiety disorder, unspecified: Principal | ICD-10-CM

## 2017-09-29 MED ORDER — HYDROXYZINE HCL 50 MG PO TABS
50 mg | Freq: Two times a day (BID) | ORAL | 1 refills | Status: CP | PRN
Start: 2017-09-29 — End: 2018-06-28

## 2017-09-29 NOTE — Telephone Encounter
Medication has been approved and sent :)

## 2017-09-29 NOTE — Telephone Encounter
From: Trilby DrummerBetty A Keaney  Sent: 09/27/2017 2:40 PM EDT  Subject: Medication Renewal Request    Tina Key would like a refill of the following medications:     hydrOXYzine HCl (ATARAX) 50 MG PO Tablet [name], APRN]    [name], [name]    Comment:

## 2017-10-01 NOTE — Progress Notes
I agree with the plan and assessment as above, no teaching statement needed.

## 2017-10-22 DIAGNOSIS — F411 Generalized anxiety disorder: Secondary | ICD-10-CM

## 2017-10-22 DIAGNOSIS — I10 Essential (primary) hypertension: Principal | ICD-10-CM

## 2017-10-23 MED ORDER — LISINOPRIL 10 MG PO TABS
10 mg | Freq: Every day | ORAL | 0 refills | Status: CP
Start: 2017-10-23 — End: 2017-11-09

## 2017-10-23 MED ORDER — DIAZEPAM 2 MG PO TABS
2 mg | Freq: Every day | ORAL | 0 refills | Status: CP | PRN
Start: 2017-10-23 — End: 2017-11-09

## 2017-10-27 ENCOUNTER — Ambulatory Visit: Attending: Family | Primary: Nurse Practitioner

## 2017-10-27 DIAGNOSIS — E559 Vitamin D deficiency, unspecified: Principal | ICD-10-CM

## 2017-10-27 DIAGNOSIS — E538 Deficiency of other specified B group vitamins: Secondary | ICD-10-CM

## 2017-10-27 DIAGNOSIS — R7303 Prediabetes: Secondary | ICD-10-CM

## 2017-10-27 DIAGNOSIS — N289 Disorder of kidney and ureter, unspecified: Secondary | ICD-10-CM

## 2017-11-09 ENCOUNTER — Ambulatory Visit: Attending: Nurse Practitioner | Primary: Nurse Practitioner

## 2017-11-09 DIAGNOSIS — Z8541 Personal history of malignant neoplasm of cervix uteri: Secondary | ICD-10-CM

## 2017-11-09 DIAGNOSIS — G4733 Obstructive sleep apnea (adult) (pediatric): Secondary | ICD-10-CM

## 2017-11-09 DIAGNOSIS — D649 Anemia, unspecified: Secondary | ICD-10-CM

## 2017-11-09 DIAGNOSIS — K449 Diaphragmatic hernia without obstruction or gangrene: Secondary | ICD-10-CM

## 2017-11-09 DIAGNOSIS — R32 Unspecified urinary incontinence: Secondary | ICD-10-CM

## 2017-11-09 DIAGNOSIS — E785 Hyperlipidemia, unspecified: Secondary | ICD-10-CM

## 2017-11-09 DIAGNOSIS — Z8719 Personal history of other diseases of the digestive system: Secondary | ICD-10-CM

## 2017-11-09 DIAGNOSIS — E049 Nontoxic goiter, unspecified: Secondary | ICD-10-CM

## 2017-11-09 DIAGNOSIS — E6609 Other obesity due to excess calories: Secondary | ICD-10-CM

## 2017-11-09 DIAGNOSIS — G8929 Other chronic pain: Secondary | ICD-10-CM

## 2017-11-09 DIAGNOSIS — F330 Major depressive disorder, recurrent, mild: Secondary | ICD-10-CM

## 2017-11-09 DIAGNOSIS — L98499 Non-pressure chronic ulcer of skin of other sites with unspecified severity: Secondary | ICD-10-CM

## 2017-11-09 DIAGNOSIS — B354 Tinea corporis: Secondary | ICD-10-CM

## 2017-11-09 DIAGNOSIS — Z6831 Body mass index (BMI) 31.0-31.9, adult: Secondary | ICD-10-CM

## 2017-11-09 DIAGNOSIS — M19042 Primary osteoarthritis, left hand: Secondary | ICD-10-CM

## 2017-11-09 DIAGNOSIS — F411 Generalized anxiety disorder: Secondary | ICD-10-CM

## 2017-11-09 DIAGNOSIS — E782 Mixed hyperlipidemia: Secondary | ICD-10-CM

## 2017-11-09 DIAGNOSIS — N289 Disorder of kidney and ureter, unspecified: Secondary | ICD-10-CM

## 2017-11-09 DIAGNOSIS — G2581 Restless legs syndrome: Secondary | ICD-10-CM

## 2017-11-09 DIAGNOSIS — I10 Essential (primary) hypertension: Secondary | ICD-10-CM

## 2017-11-09 DIAGNOSIS — J449 Chronic obstructive pulmonary disease, unspecified: Secondary | ICD-10-CM

## 2017-11-09 DIAGNOSIS — K56609 Unspecified intestinal obstruction, unspecified as to partial versus complete obstruction: Secondary | ICD-10-CM

## 2017-11-09 DIAGNOSIS — E559 Vitamin D deficiency, unspecified: Secondary | ICD-10-CM

## 2017-11-09 DIAGNOSIS — I499 Cardiac arrhythmia, unspecified: Secondary | ICD-10-CM

## 2017-11-09 DIAGNOSIS — F329 Major depressive disorder, single episode, unspecified: Secondary | ICD-10-CM

## 2017-11-09 DIAGNOSIS — F419 Anxiety disorder, unspecified: Secondary | ICD-10-CM

## 2017-11-09 DIAGNOSIS — R0683 Snoring: Secondary | ICD-10-CM

## 2017-11-09 DIAGNOSIS — K651 Peritoneal abscess: Secondary | ICD-10-CM

## 2017-11-09 DIAGNOSIS — Z9109 Other allergy status, other than to drugs and biological substances: Secondary | ICD-10-CM

## 2017-11-09 DIAGNOSIS — J45909 Unspecified asthma, uncomplicated: Secondary | ICD-10-CM

## 2017-11-09 DIAGNOSIS — E538 Deficiency of other specified B group vitamins: Principal | ICD-10-CM

## 2017-11-09 DIAGNOSIS — E349 Endocrine disorder, unspecified: Secondary | ICD-10-CM

## 2017-11-09 DIAGNOSIS — R238 Other skin changes: Secondary | ICD-10-CM

## 2017-11-09 DIAGNOSIS — G4700 Insomnia, unspecified: Secondary | ICD-10-CM

## 2017-11-09 MED ORDER — QUETIAPINE FUMARATE 25 MG PO TABS
25 mg | Freq: Two times a day (BID) | ORAL | 1 refills | Status: CP
Start: 2017-11-09 — End: 2018-06-28

## 2017-11-09 MED ORDER — LISINOPRIL 10 MG PO TABS
10 mg | Freq: Every day | ORAL | 1 refills | Status: CP
Start: 2017-11-09 — End: ?

## 2017-11-09 MED ORDER — CELECOXIB 100 MG PO CAPS
100 mg | Freq: Every day | ORAL | 1 refills | Status: CP
Start: 2017-11-09 — End: ?

## 2017-11-09 MED ORDER — ROPINIROLE HCL 2 MG PO TABS
2 mg | Freq: Every evening | ORAL | 1 refills | Status: CP
Start: 2017-11-09 — End: 2018-03-06

## 2017-11-09 MED ORDER — HYDROCHLOROTHIAZIDE 12.5 MG PO TABS
12.5 mg | Freq: Every day | ORAL | 1 refills | Status: CP
Start: 2017-11-09 — End: ?

## 2017-11-09 MED ORDER — TERBINAFINE HCL 250 MG PO TABS
250 mg | Freq: Every day | ORAL | 0 refills | Status: CP
Start: 2017-11-09 — End: 2018-03-06

## 2017-11-09 MED ORDER — MONTELUKAST SODIUM 10 MG PO TABS
10 mg | Freq: Every evening | ORAL | 1 refills | Status: CP
Start: 2017-11-09 — End: 2018-06-13

## 2017-11-09 MED ORDER — DIAZEPAM 2 MG PO TABS
2 mg | Freq: Every day | ORAL | 0 refills | Status: CP | PRN
Start: 2017-11-09 — End: 2017-12-07

## 2017-11-09 MED ORDER — ATORVASTATIN CALCIUM 20 MG PO TABS
20 mg | Freq: Every evening | ORAL | 1 refills | Status: CP
Start: 2017-11-09 — End: 2018-04-04

## 2017-12-07 DIAGNOSIS — F411 Generalized anxiety disorder: Principal | ICD-10-CM

## 2017-12-07 MED ORDER — DIAZEPAM 2 MG PO TABS
2 mg | Freq: Every day | ORAL | 0 refills | Status: CP | PRN
Start: 2017-12-07 — End: 2018-03-06

## 2018-02-16 ENCOUNTER — Encounter: Attending: Family Medicine | Primary: Nurse Practitioner

## 2018-03-06 ENCOUNTER — Ambulatory Visit: Attending: Nurse Practitioner | Primary: Nurse Practitioner

## 2018-03-06 DIAGNOSIS — N289 Disorder of kidney and ureter, unspecified: Secondary | ICD-10-CM

## 2018-03-06 DIAGNOSIS — G479 Sleep disorder, unspecified: Secondary | ICD-10-CM

## 2018-03-06 DIAGNOSIS — I10 Essential (primary) hypertension: Principal | ICD-10-CM

## 2018-03-06 DIAGNOSIS — J449 Chronic obstructive pulmonary disease, unspecified: Secondary | ICD-10-CM

## 2018-03-06 DIAGNOSIS — J45909 Unspecified asthma, uncomplicated: Secondary | ICD-10-CM

## 2018-03-06 DIAGNOSIS — E049 Nontoxic goiter, unspecified: Secondary | ICD-10-CM

## 2018-03-06 DIAGNOSIS — F329 Major depressive disorder, single episode, unspecified: Secondary | ICD-10-CM

## 2018-03-06 DIAGNOSIS — E6609 Other obesity due to excess calories: Secondary | ICD-10-CM

## 2018-03-06 DIAGNOSIS — G4700 Insomnia, unspecified: Secondary | ICD-10-CM

## 2018-03-06 DIAGNOSIS — M2550 Pain in unspecified joint: Secondary | ICD-10-CM

## 2018-03-06 DIAGNOSIS — D649 Anemia, unspecified: Secondary | ICD-10-CM

## 2018-03-06 DIAGNOSIS — G4733 Obstructive sleep apnea (adult) (pediatric): Secondary | ICD-10-CM

## 2018-03-06 DIAGNOSIS — E349 Endocrine disorder, unspecified: Secondary | ICD-10-CM

## 2018-03-06 DIAGNOSIS — K219 Gastro-esophageal reflux disease without esophagitis: Secondary | ICD-10-CM

## 2018-03-06 DIAGNOSIS — R0683 Snoring: Secondary | ICD-10-CM

## 2018-03-06 DIAGNOSIS — F419 Anxiety disorder, unspecified: Secondary | ICD-10-CM

## 2018-03-06 DIAGNOSIS — E785 Hyperlipidemia, unspecified: Secondary | ICD-10-CM

## 2018-03-06 DIAGNOSIS — F5104 Psychophysiologic insomnia: Secondary | ICD-10-CM

## 2018-03-06 DIAGNOSIS — R21 Rash and other nonspecific skin eruption: Secondary | ICD-10-CM

## 2018-03-06 DIAGNOSIS — R32 Unspecified urinary incontinence: Secondary | ICD-10-CM

## 2018-03-06 DIAGNOSIS — Z8719 Personal history of other diseases of the digestive system: Secondary | ICD-10-CM

## 2018-03-06 DIAGNOSIS — R110 Nausea: Secondary | ICD-10-CM

## 2018-03-06 DIAGNOSIS — B354 Tinea corporis: Secondary | ICD-10-CM

## 2018-03-06 DIAGNOSIS — Z6830 Body mass index (BMI) 30.0-30.9, adult: Secondary | ICD-10-CM

## 2018-03-06 DIAGNOSIS — K651 Peritoneal abscess: Secondary | ICD-10-CM

## 2018-03-06 DIAGNOSIS — Z8541 Personal history of malignant neoplasm of cervix uteri: Secondary | ICD-10-CM

## 2018-03-06 DIAGNOSIS — K56609 Unspecified intestinal obstruction, unspecified as to partial versus complete obstruction: Secondary | ICD-10-CM

## 2018-03-06 DIAGNOSIS — G8929 Other chronic pain: Secondary | ICD-10-CM

## 2018-03-06 DIAGNOSIS — K449 Diaphragmatic hernia without obstruction or gangrene: Secondary | ICD-10-CM

## 2018-03-06 DIAGNOSIS — I499 Cardiac arrhythmia, unspecified: Secondary | ICD-10-CM

## 2018-03-06 DIAGNOSIS — R238 Other skin changes: Secondary | ICD-10-CM

## 2018-03-06 DIAGNOSIS — F411 Generalized anxiety disorder: Secondary | ICD-10-CM

## 2018-03-06 DIAGNOSIS — L98499 Non-pressure chronic ulcer of skin of other sites with unspecified severity: Secondary | ICD-10-CM

## 2018-03-06 MED ORDER — ZOLPIDEM TARTRATE 5 MG PO TABS
5 mg | Freq: Every evening | ORAL | 0 refills | Status: CP | PRN
Start: 2018-03-06 — End: 2018-04-27

## 2018-03-06 MED ORDER — OMEPRAZOLE 40 MG PO CPDR
40 mg | Freq: Every morning | ORAL | 0 refills | Status: CP
Start: 2018-03-06 — End: 2018-05-02

## 2018-03-06 MED ORDER — ALBUTEROL SULFATE HFA 108 (90 BASE) MCG/ACT IN AERS: Start: 2018-03-06 — End: 2018-03-06

## 2018-04-03 ENCOUNTER — Encounter: Attending: Nurse Practitioner | Primary: Nurse Practitioner

## 2018-04-04 DIAGNOSIS — E782 Mixed hyperlipidemia: Principal | ICD-10-CM

## 2018-04-04 MED ORDER — ATORVASTATIN CALCIUM 20 MG PO TABS
1 refills | Status: CP
Start: 2018-04-04 — End: ?

## 2018-04-12 ENCOUNTER — Ambulatory Visit: Attending: Family Medicine | Primary: Nurse Practitioner

## 2018-04-12 DIAGNOSIS — E538 Deficiency of other specified B group vitamins: Secondary | ICD-10-CM

## 2018-04-12 DIAGNOSIS — I10 Essential (primary) hypertension: Secondary | ICD-10-CM

## 2018-04-12 DIAGNOSIS — E559 Vitamin D deficiency, unspecified: Principal | ICD-10-CM

## 2018-04-13 ENCOUNTER — Encounter: Primary: Nurse Practitioner

## 2018-04-19 ENCOUNTER — Encounter: Primary: Nurse Practitioner

## 2018-04-27 ENCOUNTER — Ambulatory Visit: Attending: Nurse Practitioner | Primary: Nurse Practitioner

## 2018-04-27 DIAGNOSIS — L98499 Non-pressure chronic ulcer of skin of other sites with unspecified severity: Secondary | ICD-10-CM

## 2018-04-27 DIAGNOSIS — G4733 Obstructive sleep apnea (adult) (pediatric): Secondary | ICD-10-CM

## 2018-04-27 DIAGNOSIS — F329 Major depressive disorder, single episode, unspecified: Secondary | ICD-10-CM

## 2018-04-27 DIAGNOSIS — K2980 Duodenitis without bleeding: Secondary | ICD-10-CM

## 2018-04-27 DIAGNOSIS — K449 Diaphragmatic hernia without obstruction or gangrene: Secondary | ICD-10-CM

## 2018-04-27 DIAGNOSIS — K5732 Diverticulitis of large intestine without perforation or abscess without bleeding: Secondary | ICD-10-CM

## 2018-04-27 DIAGNOSIS — R238 Other skin changes: Secondary | ICD-10-CM

## 2018-04-27 DIAGNOSIS — E785 Hyperlipidemia, unspecified: Secondary | ICD-10-CM

## 2018-04-27 DIAGNOSIS — E559 Vitamin D deficiency, unspecified: Secondary | ICD-10-CM

## 2018-04-27 DIAGNOSIS — J449 Chronic obstructive pulmonary disease, unspecified: Secondary | ICD-10-CM

## 2018-04-27 DIAGNOSIS — F419 Anxiety disorder, unspecified: Secondary | ICD-10-CM

## 2018-04-27 DIAGNOSIS — G8929 Other chronic pain: Secondary | ICD-10-CM

## 2018-04-27 DIAGNOSIS — G479 Sleep disorder, unspecified: Secondary | ICD-10-CM

## 2018-04-27 DIAGNOSIS — I10 Essential (primary) hypertension: Principal | ICD-10-CM

## 2018-04-27 DIAGNOSIS — Z8541 Personal history of malignant neoplasm of cervix uteri: Secondary | ICD-10-CM

## 2018-04-27 DIAGNOSIS — E6609 Other obesity due to excess calories: Secondary | ICD-10-CM

## 2018-04-27 DIAGNOSIS — Z6830 Body mass index (BMI) 30.0-30.9, adult: Secondary | ICD-10-CM

## 2018-04-27 DIAGNOSIS — K56609 Unspecified intestinal obstruction, unspecified as to partial versus complete obstruction: Secondary | ICD-10-CM

## 2018-04-27 DIAGNOSIS — R7301 Impaired fasting glucose: Secondary | ICD-10-CM

## 2018-04-27 DIAGNOSIS — R7303 Prediabetes: Secondary | ICD-10-CM

## 2018-04-27 DIAGNOSIS — Z8719 Personal history of other diseases of the digestive system: Secondary | ICD-10-CM

## 2018-04-27 DIAGNOSIS — R32 Unspecified urinary incontinence: Secondary | ICD-10-CM

## 2018-04-27 DIAGNOSIS — E538 Deficiency of other specified B group vitamins: Secondary | ICD-10-CM

## 2018-04-27 DIAGNOSIS — E049 Nontoxic goiter, unspecified: Secondary | ICD-10-CM

## 2018-04-27 DIAGNOSIS — G43009 Migraine without aura, not intractable, without status migrainosus: Secondary | ICD-10-CM

## 2018-04-27 DIAGNOSIS — F411 Generalized anxiety disorder: Secondary | ICD-10-CM

## 2018-04-27 DIAGNOSIS — I499 Cardiac arrhythmia, unspecified: Secondary | ICD-10-CM

## 2018-04-27 DIAGNOSIS — J45909 Unspecified asthma, uncomplicated: Secondary | ICD-10-CM

## 2018-04-27 DIAGNOSIS — E349 Endocrine disorder, unspecified: Secondary | ICD-10-CM

## 2018-04-27 DIAGNOSIS — Z23 Encounter for immunization: Secondary | ICD-10-CM

## 2018-04-27 DIAGNOSIS — G4700 Insomnia, unspecified: Secondary | ICD-10-CM

## 2018-04-27 DIAGNOSIS — K651 Peritoneal abscess: Secondary | ICD-10-CM

## 2018-04-27 DIAGNOSIS — R0683 Snoring: Secondary | ICD-10-CM

## 2018-04-27 DIAGNOSIS — D649 Anemia, unspecified: Secondary | ICD-10-CM

## 2018-04-27 DIAGNOSIS — N289 Disorder of kidney and ureter, unspecified: Secondary | ICD-10-CM

## 2018-04-27 MED ORDER — ZOLPIDEM TARTRATE 5 MG PO TABS
5 mg | Freq: Every evening | ORAL | 0 refills | Status: CP | PRN
Start: 2018-04-27 — End: ?

## 2018-04-27 MED ORDER — AMITRIPTYLINE HCL 25 MG PO TABS
25 mg | Freq: Every evening | ORAL | 1 refills | Status: CP
Start: 2018-04-27 — End: ?

## 2018-05-01 DIAGNOSIS — K219 Gastro-esophageal reflux disease without esophagitis: Secondary | ICD-10-CM

## 2018-05-01 DIAGNOSIS — R110 Nausea: Principal | ICD-10-CM

## 2018-05-01 MED ORDER — OMEPRAZOLE 40 MG PO CPDR
40 mg | Freq: Every morning | ORAL | 0 refills | Status: CP
Start: 2018-05-01 — End: ?

## 2018-05-30 ENCOUNTER — Encounter: Attending: Nurse Practitioner | Primary: Nurse Practitioner

## 2018-06-13 DIAGNOSIS — Z9109 Other allergy status, other than to drugs and biological substances: Principal | ICD-10-CM

## 2018-06-13 MED ORDER — MONTELUKAST SODIUM 10 MG PO TABS
10 mg | Freq: Every evening | ORAL | 1 refills | Status: CP
Start: 2018-06-13 — End: ?

## 2018-06-28 DIAGNOSIS — L509 Urticaria, unspecified: Secondary | ICD-10-CM

## 2018-06-28 DIAGNOSIS — F330 Major depressive disorder, recurrent, mild: Secondary | ICD-10-CM

## 2018-06-28 DIAGNOSIS — F419 Anxiety disorder, unspecified: Principal | ICD-10-CM

## 2018-06-28 MED ORDER — HYDROXYZINE HCL 50 MG PO TABS
50 mg | Freq: Two times a day (BID) | ORAL | 0 refills | Status: CP | PRN
Start: 2018-06-28 — End: ?

## 2018-06-28 MED ORDER — QUETIAPINE FUMARATE 25 MG PO TABS
25 mg | Freq: Two times a day (BID) | ORAL | 0 refills | Status: CP
Start: 2018-06-28 — End: ?

## 2018-08-21 ENCOUNTER — Ambulatory Visit: Attending: Family Medicine | Primary: Nurse Practitioner

## 2018-08-21 DIAGNOSIS — Y92009 Unspecified place in unspecified non-institutional (private) residence as the place of occurrence of the external cause: Secondary | ICD-10-CM

## 2018-08-21 DIAGNOSIS — K449 Diaphragmatic hernia without obstruction or gangrene: Secondary | ICD-10-CM

## 2018-08-21 DIAGNOSIS — M79601 Pain in right arm: Secondary | ICD-10-CM

## 2018-08-21 DIAGNOSIS — M542 Cervicalgia: Principal | ICD-10-CM

## 2018-08-21 DIAGNOSIS — K56609 Unspecified intestinal obstruction, unspecified as to partial versus complete obstruction: Secondary | ICD-10-CM

## 2018-08-21 DIAGNOSIS — J449 Chronic obstructive pulmonary disease, unspecified: Secondary | ICD-10-CM

## 2018-08-21 DIAGNOSIS — Z79899 Other long term (current) drug therapy: Secondary | ICD-10-CM

## 2018-08-21 DIAGNOSIS — R110 Nausea: Secondary | ICD-10-CM

## 2018-08-21 DIAGNOSIS — K2980 Duodenitis without bleeding: Secondary | ICD-10-CM

## 2018-08-21 DIAGNOSIS — Z8719 Personal history of other diseases of the digestive system: Secondary | ICD-10-CM

## 2018-08-21 DIAGNOSIS — W19XXXA Unspecified fall, initial encounter: Secondary | ICD-10-CM

## 2018-08-21 DIAGNOSIS — F329 Major depressive disorder, single episode, unspecified: Secondary | ICD-10-CM

## 2018-08-21 DIAGNOSIS — I10 Essential (primary) hypertension: Secondary | ICD-10-CM

## 2018-08-21 DIAGNOSIS — F419 Anxiety disorder, unspecified: Secondary | ICD-10-CM

## 2018-08-21 DIAGNOSIS — Z6830 Body mass index (BMI) 30.0-30.9, adult: Secondary | ICD-10-CM

## 2018-08-21 DIAGNOSIS — M5137 Other intervertebral disc degeneration, lumbosacral region: Secondary | ICD-10-CM

## 2018-08-21 DIAGNOSIS — Z8541 Personal history of malignant neoplasm of cervix uteri: Secondary | ICD-10-CM

## 2018-08-21 DIAGNOSIS — R413 Other amnesia: Secondary | ICD-10-CM

## 2018-08-21 DIAGNOSIS — K651 Peritoneal abscess: Secondary | ICD-10-CM

## 2018-08-21 DIAGNOSIS — K219 Gastro-esophageal reflux disease without esophagitis: Secondary | ICD-10-CM

## 2018-08-21 DIAGNOSIS — N289 Disorder of kidney and ureter, unspecified: Secondary | ICD-10-CM

## 2018-08-21 DIAGNOSIS — G4733 Obstructive sleep apnea (adult) (pediatric): Secondary | ICD-10-CM

## 2018-08-21 DIAGNOSIS — G479 Sleep disorder, unspecified: Secondary | ICD-10-CM

## 2018-08-21 DIAGNOSIS — R0683 Snoring: Secondary | ICD-10-CM

## 2018-08-21 DIAGNOSIS — G4700 Insomnia, unspecified: Secondary | ICD-10-CM

## 2018-08-21 DIAGNOSIS — D649 Anemia, unspecified: Secondary | ICD-10-CM

## 2018-08-21 DIAGNOSIS — E785 Hyperlipidemia, unspecified: Secondary | ICD-10-CM

## 2018-08-21 DIAGNOSIS — R32 Unspecified urinary incontinence: Secondary | ICD-10-CM

## 2018-08-21 DIAGNOSIS — I499 Cardiac arrhythmia, unspecified: Secondary | ICD-10-CM

## 2018-08-21 DIAGNOSIS — G8929 Other chronic pain: Secondary | ICD-10-CM

## 2018-08-21 DIAGNOSIS — L98499 Non-pressure chronic ulcer of skin of other sites with unspecified severity: Secondary | ICD-10-CM

## 2018-08-21 DIAGNOSIS — R238 Other skin changes: Secondary | ICD-10-CM

## 2018-08-21 DIAGNOSIS — E349 Endocrine disorder, unspecified: Secondary | ICD-10-CM

## 2018-08-21 DIAGNOSIS — J45909 Unspecified asthma, uncomplicated: Secondary | ICD-10-CM

## 2018-08-21 DIAGNOSIS — E049 Nontoxic goiter, unspecified: Secondary | ICD-10-CM

## 2018-08-21 DIAGNOSIS — K5732 Diverticulitis of large intestine without perforation or abscess without bleeding: Secondary | ICD-10-CM

## 2018-08-21 MED ORDER — OMEPRAZOLE 40 MG PO CPDR
40 mg | Freq: Every morning | ORAL | 1 refills | 60.00000 days | Status: CP
Start: 2018-08-21 — End: 2018-12-13

## 2018-08-21 MED ORDER — ZOLPIDEM TARTRATE 5 MG PO TABS
5 mg | Freq: Every evening | ORAL | 0 refills | Status: CP | PRN
Start: 2018-08-21 — End: 2018-12-13

## 2018-10-01 DIAGNOSIS — Z9109 Other allergy status, other than to drugs and biological substances: Principal | ICD-10-CM

## 2018-10-01 DIAGNOSIS — I10 Essential (primary) hypertension: Principal | ICD-10-CM

## 2018-10-01 DIAGNOSIS — E782 Mixed hyperlipidemia: Secondary | ICD-10-CM

## 2018-10-02 MED ORDER — MONTELUKAST SODIUM 10 MG PO TABS
10 mg | Freq: Every evening | ORAL | 1 refills | 30.00000 days | Status: CP
Start: 2018-10-02 — End: ?

## 2018-10-02 MED ORDER — MONTELUKAST SODIUM 10 MG PO TABS
10 mg | Freq: Every evening | ORAL | 1 refills | 30.00000 days | Status: CP
Start: 2018-10-02 — End: 2018-11-19

## 2018-10-02 MED ORDER — LISINOPRIL 10 MG PO TABS
10 mg | Freq: Every day | ORAL | 1 refills | Status: CP
Start: 2018-10-02 — End: 2018-11-02

## 2018-10-02 MED ORDER — ATORVASTATIN CALCIUM 20 MG PO TABS
20 mg | Freq: Every evening | ORAL | 1 refills | Status: CP
Start: 2018-10-02 — End: ?

## 2018-10-25 DIAGNOSIS — F330 Major depressive disorder, recurrent, mild: Principal | ICD-10-CM

## 2018-10-25 MED ORDER — QUETIAPINE FUMARATE 25 MG PO TABS
25 mg | Freq: Two times a day (BID) | ORAL | 0 refills | Status: CP
Start: 2018-10-25 — End: ?

## 2018-11-02 ENCOUNTER — Ambulatory Visit: Attending: Nurse Practitioner | Primary: Nurse Practitioner

## 2018-11-02 DIAGNOSIS — N3000 Acute cystitis without hematuria: Secondary | ICD-10-CM

## 2018-11-02 DIAGNOSIS — R238 Other skin changes: Secondary | ICD-10-CM

## 2018-11-02 DIAGNOSIS — I10 Essential (primary) hypertension: Principal | ICD-10-CM

## 2018-11-02 DIAGNOSIS — K56609 Unspecified intestinal obstruction, unspecified as to partial versus complete obstruction: Secondary | ICD-10-CM

## 2018-11-02 DIAGNOSIS — D649 Anemia, unspecified: Secondary | ICD-10-CM

## 2018-11-02 DIAGNOSIS — K2980 Duodenitis without bleeding: Secondary | ICD-10-CM

## 2018-11-02 DIAGNOSIS — R0683 Snoring: Secondary | ICD-10-CM

## 2018-11-02 DIAGNOSIS — Z8719 Personal history of other diseases of the digestive system: Secondary | ICD-10-CM

## 2018-11-02 DIAGNOSIS — L98499 Non-pressure chronic ulcer of skin of other sites with unspecified severity: Secondary | ICD-10-CM

## 2018-11-02 DIAGNOSIS — R3915 Urgency of urination: Secondary | ICD-10-CM

## 2018-11-02 DIAGNOSIS — Z6830 Body mass index (BMI) 30.0-30.9, adult: Secondary | ICD-10-CM

## 2018-11-02 DIAGNOSIS — J449 Chronic obstructive pulmonary disease, unspecified: Secondary | ICD-10-CM

## 2018-11-02 DIAGNOSIS — Z8541 Personal history of malignant neoplasm of cervix uteri: Secondary | ICD-10-CM

## 2018-11-02 DIAGNOSIS — I499 Cardiac arrhythmia, unspecified: Secondary | ICD-10-CM

## 2018-11-02 DIAGNOSIS — R300 Dysuria: Principal | ICD-10-CM

## 2018-11-02 DIAGNOSIS — J45909 Unspecified asthma, uncomplicated: Secondary | ICD-10-CM

## 2018-11-02 DIAGNOSIS — F329 Major depressive disorder, single episode, unspecified: Secondary | ICD-10-CM

## 2018-11-02 DIAGNOSIS — E349 Endocrine disorder, unspecified: Secondary | ICD-10-CM

## 2018-11-02 DIAGNOSIS — G8929 Other chronic pain: Secondary | ICD-10-CM

## 2018-11-02 DIAGNOSIS — E6609 Other obesity due to excess calories: Secondary | ICD-10-CM

## 2018-11-02 DIAGNOSIS — E785 Hyperlipidemia, unspecified: Secondary | ICD-10-CM

## 2018-11-02 DIAGNOSIS — K5732 Diverticulitis of large intestine without perforation or abscess without bleeding: Secondary | ICD-10-CM

## 2018-11-02 DIAGNOSIS — N289 Disorder of kidney and ureter, unspecified: Secondary | ICD-10-CM

## 2018-11-02 DIAGNOSIS — R32 Unspecified urinary incontinence: Secondary | ICD-10-CM

## 2018-11-02 DIAGNOSIS — E049 Nontoxic goiter, unspecified: Secondary | ICD-10-CM

## 2018-11-02 DIAGNOSIS — G4733 Obstructive sleep apnea (adult) (pediatric): Secondary | ICD-10-CM

## 2018-11-02 DIAGNOSIS — K449 Diaphragmatic hernia without obstruction or gangrene: Secondary | ICD-10-CM

## 2018-11-02 DIAGNOSIS — F419 Anxiety disorder, unspecified: Secondary | ICD-10-CM

## 2018-11-02 DIAGNOSIS — K651 Peritoneal abscess: Secondary | ICD-10-CM

## 2018-11-02 DIAGNOSIS — R350 Frequency of micturition: Secondary | ICD-10-CM

## 2018-11-02 DIAGNOSIS — G4700 Insomnia, unspecified: Secondary | ICD-10-CM

## 2018-11-02 MED ORDER — CIPROFLOXACIN HCL 500 MG PO TABS
500 mg | Freq: Two times a day (BID) | ORAL | 0 refills | Status: CP
Start: 2018-11-02 — End: 2018-11-19

## 2018-11-02 MED ORDER — LISINOPRIL 5 MG PO TABS
5 mg | Freq: Every day | ORAL | 1 refills | Status: CP
Start: 2018-11-02 — End: 2018-11-19

## 2018-11-19 DIAGNOSIS — Z8541 Personal history of malignant neoplasm of cervix uteri: Secondary | ICD-10-CM

## 2018-11-19 DIAGNOSIS — F329 Major depressive disorder, single episode, unspecified: Secondary | ICD-10-CM

## 2018-11-19 DIAGNOSIS — N3946 Mixed incontinence: Secondary | ICD-10-CM

## 2018-11-19 DIAGNOSIS — G4700 Insomnia, unspecified: Secondary | ICD-10-CM

## 2018-11-19 DIAGNOSIS — R32 Unspecified urinary incontinence: Secondary | ICD-10-CM

## 2018-11-19 DIAGNOSIS — R0683 Snoring: Secondary | ICD-10-CM

## 2018-11-19 DIAGNOSIS — I10 Essential (primary) hypertension: Principal | ICD-10-CM

## 2018-11-19 DIAGNOSIS — N289 Disorder of kidney and ureter, unspecified: Secondary | ICD-10-CM

## 2018-11-19 DIAGNOSIS — R238 Other skin changes: Secondary | ICD-10-CM

## 2018-11-19 DIAGNOSIS — J45909 Unspecified asthma, uncomplicated: Secondary | ICD-10-CM

## 2018-11-19 DIAGNOSIS — K5732 Diverticulitis of large intestine without perforation or abscess without bleeding: Secondary | ICD-10-CM

## 2018-11-19 DIAGNOSIS — K651 Peritoneal abscess: Secondary | ICD-10-CM

## 2018-11-19 DIAGNOSIS — K2980 Duodenitis without bleeding: Secondary | ICD-10-CM

## 2018-11-19 DIAGNOSIS — Z8719 Personal history of other diseases of the digestive system: Secondary | ICD-10-CM

## 2018-11-19 DIAGNOSIS — E049 Nontoxic goiter, unspecified: Secondary | ICD-10-CM

## 2018-11-19 DIAGNOSIS — E785 Hyperlipidemia, unspecified: Secondary | ICD-10-CM

## 2018-11-19 DIAGNOSIS — G8929 Other chronic pain: Secondary | ICD-10-CM

## 2018-11-19 DIAGNOSIS — F419 Anxiety disorder, unspecified: Secondary | ICD-10-CM

## 2018-11-19 DIAGNOSIS — E349 Endocrine disorder, unspecified: Secondary | ICD-10-CM

## 2018-11-19 DIAGNOSIS — K449 Diaphragmatic hernia without obstruction or gangrene: Secondary | ICD-10-CM

## 2018-11-19 DIAGNOSIS — J029 Acute pharyngitis, unspecified: Secondary | ICD-10-CM

## 2018-11-19 DIAGNOSIS — L98499 Non-pressure chronic ulcer of skin of other sites with unspecified severity: Secondary | ICD-10-CM

## 2018-11-19 DIAGNOSIS — G4733 Obstructive sleep apnea (adult) (pediatric): Secondary | ICD-10-CM

## 2018-11-19 DIAGNOSIS — M5442 Lumbago with sciatica, left side: Secondary | ICD-10-CM

## 2018-11-19 DIAGNOSIS — K56609 Unspecified intestinal obstruction, unspecified as to partial versus complete obstruction: Secondary | ICD-10-CM

## 2018-11-19 DIAGNOSIS — R51 Headache: Secondary | ICD-10-CM

## 2018-11-19 DIAGNOSIS — I499 Cardiac arrhythmia, unspecified: Secondary | ICD-10-CM

## 2018-11-19 DIAGNOSIS — D649 Anemia, unspecified: Secondary | ICD-10-CM

## 2018-11-19 DIAGNOSIS — J449 Chronic obstructive pulmonary disease, unspecified: Secondary | ICD-10-CM

## 2018-11-19 MED ORDER — GABAPENTIN 100 MG PO CAPS
100 mg | Freq: Three times a day (TID) | ORAL | 0 refills | Status: CP
Start: 2018-11-19 — End: ?

## 2018-11-19 MED ORDER — SOLIFENACIN SUCCINATE 5 MG PO TABS
5 mg | Freq: Every day | ORAL | 1 refills | Status: CP
Start: 2018-11-19 — End: ?

## 2018-11-19 MED ORDER — LISINOPRIL 10 MG PO TABS
10 mg | Freq: Every day | ORAL | 1 refills | Status: CP
Start: 2018-11-19 — End: ?

## 2018-12-09 DIAGNOSIS — K219 Gastro-esophageal reflux disease without esophagitis: Secondary | ICD-10-CM

## 2018-12-09 DIAGNOSIS — G479 Sleep disorder, unspecified: Secondary | ICD-10-CM

## 2018-12-09 DIAGNOSIS — R110 Nausea: Principal | ICD-10-CM

## 2018-12-12 ENCOUNTER — Encounter: Attending: Nurse Practitioner | Primary: Nurse Practitioner

## 2018-12-13 MED ORDER — OMEPRAZOLE 40 MG PO CPDR
40 mg | Freq: Every morning | ORAL | 1 refills | 60.00000 days | Status: CP
Start: 2018-12-13 — End: ?

## 2018-12-13 MED ORDER — ZOLPIDEM TARTRATE 5 MG PO TABS
5 mg | Freq: Every evening | ORAL | 0 refills | Status: CP | PRN
Start: 2018-12-13 — End: ?

## 2019-01-15 ENCOUNTER — Encounter: Attending: Nurse Practitioner | Primary: Nurse Practitioner

## 2019-02-13 MED ORDER — LISINOPRIL 10 MG PO TABS
10 mg | Freq: Every day | ORAL | 1 refills | Status: CP
Start: 2019-02-13 — End: ?

## 2019-02-13 MED ORDER — QUETIAPINE FUMARATE 25 MG PO TABS
25 mg | Freq: Two times a day (BID) | ORAL | 0 refills | Status: CP
Start: 2019-02-13 — End: ?

## 2019-03-04 ENCOUNTER — Ambulatory Visit: Attending: Nurse Practitioner | Primary: Nurse Practitioner

## 2019-03-04 DIAGNOSIS — E349 Endocrine disorder, unspecified: Secondary | ICD-10-CM

## 2019-03-04 DIAGNOSIS — K2980 Duodenitis without bleeding: Secondary | ICD-10-CM

## 2019-03-04 DIAGNOSIS — K56609 Unspecified intestinal obstruction, unspecified as to partial versus complete obstruction: Secondary | ICD-10-CM

## 2019-03-04 DIAGNOSIS — Z2820 Immunization not carried out because of patient decision for unspecified reason: Secondary | ICD-10-CM

## 2019-03-04 DIAGNOSIS — I10 Essential (primary) hypertension: Principal | ICD-10-CM

## 2019-03-04 DIAGNOSIS — K5732 Diverticulitis of large intestine without perforation or abscess without bleeding: Secondary | ICD-10-CM

## 2019-03-04 DIAGNOSIS — K5901 Slow transit constipation: Secondary | ICD-10-CM

## 2019-03-04 DIAGNOSIS — R7303 Prediabetes: Secondary | ICD-10-CM

## 2019-03-04 DIAGNOSIS — R7301 Impaired fasting glucose: Secondary | ICD-10-CM

## 2019-03-04 DIAGNOSIS — F329 Major depressive disorder, single episode, unspecified: Secondary | ICD-10-CM

## 2019-03-04 DIAGNOSIS — R238 Other skin changes: Secondary | ICD-10-CM

## 2019-03-04 DIAGNOSIS — Z0000 Encounter for general adult medical examination without abnormal findings: Secondary | ICD-10-CM

## 2019-03-04 DIAGNOSIS — J45909 Unspecified asthma, uncomplicated: Secondary | ICD-10-CM

## 2019-03-04 DIAGNOSIS — Z1239 Encounter for other screening for malignant neoplasm of breast: Secondary | ICD-10-CM

## 2019-03-04 DIAGNOSIS — E785 Hyperlipidemia, unspecified: Secondary | ICD-10-CM

## 2019-03-04 DIAGNOSIS — K651 Peritoneal abscess: Secondary | ICD-10-CM

## 2019-03-04 DIAGNOSIS — J449 Chronic obstructive pulmonary disease, unspecified: Secondary | ICD-10-CM

## 2019-03-04 DIAGNOSIS — G4700 Insomnia, unspecified: Secondary | ICD-10-CM

## 2019-03-04 DIAGNOSIS — K449 Diaphragmatic hernia without obstruction or gangrene: Secondary | ICD-10-CM

## 2019-03-04 DIAGNOSIS — G8929 Other chronic pain: Secondary | ICD-10-CM

## 2019-03-04 DIAGNOSIS — I499 Cardiac arrhythmia, unspecified: Secondary | ICD-10-CM

## 2019-03-04 DIAGNOSIS — Z8719 Personal history of other diseases of the digestive system: Secondary | ICD-10-CM

## 2019-03-04 DIAGNOSIS — E782 Mixed hyperlipidemia: Secondary | ICD-10-CM

## 2019-03-04 DIAGNOSIS — E559 Vitamin D deficiency, unspecified: Secondary | ICD-10-CM

## 2019-03-04 DIAGNOSIS — G4733 Obstructive sleep apnea (adult) (pediatric): Secondary | ICD-10-CM

## 2019-03-04 DIAGNOSIS — L98499 Non-pressure chronic ulcer of skin of other sites with unspecified severity: Secondary | ICD-10-CM

## 2019-03-04 DIAGNOSIS — R32 Unspecified urinary incontinence: Secondary | ICD-10-CM

## 2019-03-04 DIAGNOSIS — R5383 Other fatigue: Secondary | ICD-10-CM

## 2019-03-04 DIAGNOSIS — E049 Nontoxic goiter, unspecified: Secondary | ICD-10-CM

## 2019-03-04 DIAGNOSIS — F419 Anxiety disorder, unspecified: Secondary | ICD-10-CM

## 2019-03-04 DIAGNOSIS — Z87891 Personal history of nicotine dependence: Secondary | ICD-10-CM

## 2019-03-04 DIAGNOSIS — E538 Deficiency of other specified B group vitamins: Secondary | ICD-10-CM

## 2019-03-04 DIAGNOSIS — R5381 Other malaise: Secondary | ICD-10-CM

## 2019-03-04 DIAGNOSIS — R0683 Snoring: Secondary | ICD-10-CM

## 2019-03-04 DIAGNOSIS — Z8541 Personal history of malignant neoplasm of cervix uteri: Secondary | ICD-10-CM

## 2019-03-04 DIAGNOSIS — R21 Rash and other nonspecific skin eruption: Secondary | ICD-10-CM

## 2019-03-04 DIAGNOSIS — D649 Anemia, unspecified: Secondary | ICD-10-CM

## 2019-03-04 DIAGNOSIS — N289 Disorder of kidney and ureter, unspecified: Secondary | ICD-10-CM

## 2019-03-04 MED ORDER — NYSTATIN-TRIAMCINOLONE 100000-0.1 UNIT/GM-% EX OINT
TOPICAL | 0 refills | 18.50000 days | Status: CP
Start: 2019-03-04 — End: ?

## 2019-03-14 DIAGNOSIS — M5442 Lumbago with sciatica, left side: Secondary | ICD-10-CM

## 2019-03-14 DIAGNOSIS — F419 Anxiety disorder, unspecified: Principal | ICD-10-CM

## 2019-03-14 DIAGNOSIS — G479 Sleep disorder, unspecified: Secondary | ICD-10-CM

## 2019-03-14 DIAGNOSIS — L509 Urticaria, unspecified: Secondary | ICD-10-CM

## 2019-03-14 DIAGNOSIS — N3946 Mixed incontinence: Secondary | ICD-10-CM

## 2019-03-14 DIAGNOSIS — M19042 Primary osteoarthritis, left hand: Secondary | ICD-10-CM

## 2019-03-14 DIAGNOSIS — F330 Major depressive disorder, recurrent, mild: Secondary | ICD-10-CM

## 2019-03-14 DIAGNOSIS — R110 Nausea: Secondary | ICD-10-CM

## 2019-03-14 DIAGNOSIS — Z9109 Other allergy status, other than to drugs and biological substances: Secondary | ICD-10-CM

## 2019-03-14 DIAGNOSIS — K219 Gastro-esophageal reflux disease without esophagitis: Secondary | ICD-10-CM

## 2019-03-14 DIAGNOSIS — E782 Mixed hyperlipidemia: Secondary | ICD-10-CM

## 2019-03-14 DIAGNOSIS — I10 Essential (primary) hypertension: Secondary | ICD-10-CM

## 2019-03-14 MED ORDER — LISINOPRIL 10 MG PO TABS
10 mg | Freq: Every day | ORAL | 1 refills | Status: CN
Start: 2019-03-14 — End: ?

## 2019-03-14 MED ORDER — ZOLPIDEM TARTRATE 5 MG PO TABS
5 mg | Freq: Every evening | ORAL | 0 refills | Status: CN | PRN
Start: 2019-03-14 — End: ?

## 2019-03-14 MED ORDER — CELECOXIB 100 MG PO CAPS
100 mg | Freq: Every day | ORAL | 1 refills | Status: CN
Start: 2019-03-14 — End: ?

## 2019-03-14 MED ORDER — QUETIAPINE FUMARATE 25 MG PO TABS
25 mg | Freq: Two times a day (BID) | ORAL | 0 refills | Status: CN
Start: 2019-03-14 — End: ?

## 2019-03-14 MED ORDER — MONTELUKAST SODIUM 10 MG PO TABS
10 mg | Freq: Every evening | ORAL | 1 refills | 30.00000 days | Status: CP
Start: 2019-03-14 — End: ?

## 2019-03-14 MED ORDER — ATORVASTATIN CALCIUM 20 MG PO TABS
20 mg | Freq: Every evening | ORAL | 1 refills | Status: CP
Start: 2019-03-14 — End: ?

## 2019-03-14 MED ORDER — GABAPENTIN 100 MG PO CAPS
100 mg | Freq: Three times a day (TID) | ORAL | 1 refills | Status: CP
Start: 2019-03-14 — End: ?

## 2019-03-14 MED ORDER — OMEPRAZOLE 40 MG PO CPDR
40 mg | Freq: Every morning | ORAL | 1 refills | Status: CN
Start: 2019-03-14 — End: ?

## 2019-03-14 MED ORDER — ZOLPIDEM TARTRATE 5 MG PO TABS
5 mg | Freq: Every evening | ORAL | 0 refills | Status: CP | PRN
Start: 2019-03-14 — End: ?

## 2019-03-14 MED ORDER — HYDROXYZINE HCL 50 MG PO TABS
50 mg | Freq: Two times a day (BID) | ORAL | 0 refills | Status: CP | PRN
Start: 2019-03-14 — End: ?

## 2019-03-14 MED ORDER — SOLIFENACIN SUCCINATE 5 MG PO TABS
5 mg | Freq: Every day | ORAL | 1 refills | 30.00000 days | Status: CP
Start: 2019-03-14 — End: ?

## 2019-03-15 DIAGNOSIS — R110 Nausea: Principal | ICD-10-CM

## 2019-03-15 DIAGNOSIS — K219 Gastro-esophageal reflux disease without esophagitis: Secondary | ICD-10-CM

## 2019-03-15 DIAGNOSIS — G479 Sleep disorder, unspecified: Secondary | ICD-10-CM

## 2019-03-15 MED ORDER — OMEPRAZOLE 40 MG PO CPDR
40 mg | Freq: Every morning | ORAL | 1 refills | 60.00000 days | Status: CP
Start: 2019-03-15 — End: ?

## 2019-03-15 MED ORDER — ZOLPIDEM TARTRATE 5 MG PO TABS
5 mg | Freq: Every evening | ORAL | 0 refills | Status: CP | PRN
Start: 2019-03-15 — End: ?

## 2019-03-28 ENCOUNTER — Ambulatory Visit: Attending: Nurse Practitioner | Primary: Nurse Practitioner

## 2019-03-28 DIAGNOSIS — G4733 Obstructive sleep apnea (adult) (pediatric): Secondary | ICD-10-CM

## 2019-03-28 DIAGNOSIS — Z8541 Personal history of malignant neoplasm of cervix uteri: Secondary | ICD-10-CM

## 2019-03-28 DIAGNOSIS — K449 Diaphragmatic hernia without obstruction or gangrene: Secondary | ICD-10-CM

## 2019-03-28 DIAGNOSIS — E559 Vitamin D deficiency, unspecified: Principal | ICD-10-CM

## 2019-03-28 DIAGNOSIS — E785 Hyperlipidemia, unspecified: Secondary | ICD-10-CM

## 2019-03-28 DIAGNOSIS — R0683 Snoring: Secondary | ICD-10-CM

## 2019-03-28 DIAGNOSIS — Z6832 Body mass index (BMI) 32.0-32.9, adult: Secondary | ICD-10-CM

## 2019-03-28 DIAGNOSIS — E6609 Other obesity due to excess calories: Secondary | ICD-10-CM

## 2019-03-28 DIAGNOSIS — J449 Chronic obstructive pulmonary disease, unspecified: Secondary | ICD-10-CM

## 2019-03-28 DIAGNOSIS — K56609 Unspecified intestinal obstruction, unspecified as to partial versus complete obstruction: Secondary | ICD-10-CM

## 2019-03-28 DIAGNOSIS — K651 Peritoneal abscess: Secondary | ICD-10-CM

## 2019-03-28 DIAGNOSIS — I10 Essential (primary) hypertension: Secondary | ICD-10-CM

## 2019-03-28 DIAGNOSIS — Z8719 Personal history of other diseases of the digestive system: Secondary | ICD-10-CM

## 2019-03-28 DIAGNOSIS — Z2820 Immunization not carried out because of patient decision for unspecified reason: Secondary | ICD-10-CM

## 2019-03-28 DIAGNOSIS — I499 Cardiac arrhythmia, unspecified: Secondary | ICD-10-CM

## 2019-03-28 DIAGNOSIS — F5104 Psychophysiologic insomnia: Secondary | ICD-10-CM

## 2019-03-28 DIAGNOSIS — R238 Other skin changes: Secondary | ICD-10-CM

## 2019-03-28 DIAGNOSIS — N289 Disorder of kidney and ureter, unspecified: Secondary | ICD-10-CM

## 2019-03-28 DIAGNOSIS — R32 Unspecified urinary incontinence: Secondary | ICD-10-CM

## 2019-03-28 DIAGNOSIS — F329 Major depressive disorder, single episode, unspecified: Secondary | ICD-10-CM

## 2019-03-28 DIAGNOSIS — F419 Anxiety disorder, unspecified: Secondary | ICD-10-CM

## 2019-03-28 DIAGNOSIS — E349 Endocrine disorder, unspecified: Secondary | ICD-10-CM

## 2019-03-28 DIAGNOSIS — E538 Deficiency of other specified B group vitamins: Secondary | ICD-10-CM

## 2019-03-28 DIAGNOSIS — D649 Anemia, unspecified: Secondary | ICD-10-CM

## 2019-03-28 DIAGNOSIS — G8929 Other chronic pain: Secondary | ICD-10-CM

## 2019-03-28 DIAGNOSIS — K5732 Diverticulitis of large intestine without perforation or abscess without bleeding: Secondary | ICD-10-CM

## 2019-03-28 DIAGNOSIS — E049 Nontoxic goiter, unspecified: Secondary | ICD-10-CM

## 2019-03-28 DIAGNOSIS — L98499 Non-pressure chronic ulcer of skin of other sites with unspecified severity: Secondary | ICD-10-CM

## 2019-03-28 DIAGNOSIS — K2980 Duodenitis without bleeding: Secondary | ICD-10-CM

## 2019-03-28 DIAGNOSIS — G4700 Insomnia, unspecified: Secondary | ICD-10-CM

## 2019-03-28 DIAGNOSIS — J45909 Unspecified asthma, uncomplicated: Secondary | ICD-10-CM

## 2019-03-28 MED ORDER — ERGOCALCIFEROL 1.25 MG (50000 UT) PO CAPS
1.25 mg | ORAL | 1 refills | 28.00000 days | Status: CP
Start: 2019-03-28 — End: ?

## 2019-03-30 DIAGNOSIS — E782 Mixed hyperlipidemia: Principal | ICD-10-CM

## 2019-03-31 MED ORDER — ATORVASTATIN CALCIUM 20 MG PO TABS
1 refills | Status: CP
Start: 2019-03-31 — End: ?

## 2019-04-05 DIAGNOSIS — G479 Sleep disorder, unspecified: Principal | ICD-10-CM

## 2019-04-05 MED ORDER — ZOLPIDEM TARTRATE 5 MG PO TABS
5 mg | Freq: Every evening | ORAL | 0 refills | Status: CP | PRN
Start: 2019-04-05 — End: ?

## 2019-04-11 DIAGNOSIS — E559 Vitamin D deficiency, unspecified: Secondary | ICD-10-CM

## 2019-04-11 DIAGNOSIS — I10 Essential (primary) hypertension: Secondary | ICD-10-CM

## 2019-04-11 DIAGNOSIS — L509 Urticaria, unspecified: Secondary | ICD-10-CM

## 2019-04-11 DIAGNOSIS — E782 Mixed hyperlipidemia: Principal | ICD-10-CM

## 2019-04-11 DIAGNOSIS — K219 Gastro-esophageal reflux disease without esophagitis: Secondary | ICD-10-CM

## 2019-04-11 DIAGNOSIS — Z9109 Other allergy status, other than to drugs and biological substances: Secondary | ICD-10-CM

## 2019-04-11 DIAGNOSIS — F419 Anxiety disorder, unspecified: Secondary | ICD-10-CM

## 2019-04-11 DIAGNOSIS — R110 Nausea: Secondary | ICD-10-CM

## 2019-04-11 MED ORDER — HYDROXYZINE HCL 50 MG PO TABS
50 mg | Freq: Two times a day (BID) | ORAL | 0 refills | PRN
Start: 2019-04-11 — End: ?

## 2019-04-11 MED ORDER — MONTELUKAST SODIUM 10 MG PO TABS
10 mg | Freq: Every evening | ORAL | 1 refills
Start: 2019-04-11 — End: ?

## 2019-04-11 MED ORDER — ATORVASTATIN CALCIUM 20 MG PO TABS
20 mg | Freq: Every evening | ORAL | 1 refills
Start: 2019-04-11 — End: ?

## 2019-04-11 MED ORDER — ERGOCALCIFEROL 1.25 MG (50000 UT) PO CAPS
1.25 mg | ORAL | 1 refills
Start: 2019-04-11 — End: ?

## 2019-04-11 MED ORDER — LISINOPRIL 10 MG PO TABS
10 mg | Freq: Every day | ORAL | 1 refills
Start: 2019-04-11 — End: ?

## 2019-04-11 MED ORDER — OMEPRAZOLE 40 MG PO CPDR
40 mg | Freq: Every morning | ORAL | 1 refills
Start: 2019-04-11 — End: ?

## 2019-04-12 DIAGNOSIS — E559 Vitamin D deficiency, unspecified: Secondary | ICD-10-CM

## 2019-04-12 DIAGNOSIS — I10 Essential (primary) hypertension: Secondary | ICD-10-CM

## 2019-04-12 DIAGNOSIS — E782 Mixed hyperlipidemia: Principal | ICD-10-CM

## 2019-04-12 DIAGNOSIS — R110 Nausea: Secondary | ICD-10-CM

## 2019-04-12 DIAGNOSIS — K219 Gastro-esophageal reflux disease without esophagitis: Secondary | ICD-10-CM

## 2019-04-12 DIAGNOSIS — Z9109 Other allergy status, other than to drugs and biological substances: Secondary | ICD-10-CM

## 2019-04-12 DIAGNOSIS — F330 Major depressive disorder, recurrent, mild: Secondary | ICD-10-CM

## 2019-04-12 MED ORDER — MONTELUKAST SODIUM 10 MG PO TABS
10 mg | Freq: Every evening | ORAL | 1 refills | 30.00000 days | Status: CP
Start: 2019-04-12 — End: ?

## 2019-04-12 MED ORDER — OMEPRAZOLE 40 MG PO CPDR
40 mg | Freq: Every morning | ORAL | 1 refills | 60.00000 days | Status: CP
Start: 2019-04-12 — End: ?

## 2019-04-12 MED ORDER — LISINOPRIL 10 MG PO TABS
10 mg | Freq: Every day | ORAL | 1 refills | Status: CP
Start: 2019-04-12 — End: ?

## 2019-04-12 MED ORDER — QUETIAPINE FUMARATE 25 MG PO TABS
25 mg | Freq: Two times a day (BID) | ORAL | 0 refills | Status: CP
Start: 2019-04-12 — End: ?

## 2019-04-12 MED ORDER — ERGOCALCIFEROL 1.25 MG (50000 UT) PO CAPS
1.25 mg | ORAL | 1 refills | 28.00000 days | Status: CP
Start: 2019-04-12 — End: ?

## 2019-04-12 MED ORDER — ATORVASTATIN CALCIUM 20 MG PO TABS
20 mg | Freq: Every evening | ORAL | 1 refills | Status: CP
Start: 2019-04-12 — End: ?

## 2019-04-29 DIAGNOSIS — E538 Deficiency of other specified B group vitamins: Secondary | ICD-10-CM | POA: Insufficient documentation

## 2019-04-29 DIAGNOSIS — Z859 Personal history of malignant neoplasm, unspecified: Secondary | ICD-10-CM | POA: Insufficient documentation

## 2019-04-29 DIAGNOSIS — E559 Vitamin D deficiency, unspecified: Secondary | ICD-10-CM | POA: Insufficient documentation

## 2019-06-03 ENCOUNTER — Ambulatory Visit: Payer: Medicare HMO | Admitting: Cardiovascular Disease

## 2019-06-03 ENCOUNTER — Other Ambulatory Visit: Payer: Self-pay

## 2019-06-03 ENCOUNTER — Encounter: Payer: Self-pay | Admitting: Cardiovascular Disease

## 2019-06-03 VITALS — BP 106/74 | HR 123 | Ht 61.0 in | Wt 173.6 lb

## 2019-06-03 DIAGNOSIS — I10 Essential (primary) hypertension: Secondary | ICD-10-CM | POA: Diagnosis not present

## 2019-06-03 DIAGNOSIS — E785 Hyperlipidemia, unspecified: Secondary | ICD-10-CM

## 2019-06-03 NOTE — Assessment & Plan Note (Signed)
History of hyperlipidemia on statin therapy.  We will check a lipid liver profile.

## 2019-06-03 NOTE — Progress Notes (Signed)
06/03/2019 Tracey Saunders   1956-06-22  [id number]  Primary Physician Nickola Major, MD Primary Cardiologist: Lorretta Harp MD Garret Reddish, [place], [place]  HPI:  Tracey Saunders is a 63 y.o. moderately overweight widowed Caucasian female mother of 1 son who currently lives lives in [place], grandmother to 5 grandchildren who was referred by Dr. Daron Offer for cardiovascular valuation because of hypertension.  She recently relocated from [facility] to [place] 3 months ago.  Her brother-in-law Lorelle Formosa is a patient of mine.  Risk factors include 80-pack-year tobacco use having quit 5 years ago, treated hypertension and hyperlipidemia.  She is never had a heart attack or stroke.  There is no family history for heart disease.  She denies chest pain or shortness of breath.  She was significantly hypertensive when she arrived to [facility] and saw her PCP who added chlorthalidone resulting in marked improvement in her blood pressures.   Current Meds  Medication Sig  . atorvastatin (LIPITOR) 20 MG tablet   . chlorthalidone (HYGROTON) 25 MG tablet   . lisinopril (ZESTRIL) 10 MG tablet   . montelukast (SINGULAIR) 10 MG tablet   . omeprazole (PRILOSEC) 40 MG capsule   . QUEtiapine (SEROQUEL) 25 MG tablet Take by mouth.  . Vitamin D, Ergocalciferol, (DRISDOL) 1.25 MG (50000 UT) CAPS capsule Take by mouth.  . zolpidem (AMBIEN) 5 MG tablet      Not on File  Social History   Socioeconomic History  . Marital status: Unknown    Spouse name: Not on file  . Number of children: Not on file  . Years of education: Not on file  . Highest education level: Not on file  Occupational History  . Not on file  Social Needs  . Financial resource strain: Not on file  . Food insecurity    Worry: Not on file    Inability: Not on file  . Transportation needs    Medical: Not on file    Non-medical: Not on file  Tobacco Use  . Smoking status: Never Smoker  . Smokeless tobacco: Never Used   Substance and Sexual Activity  . Alcohol use: Not on file  . Drug use: Not on file  . Sexual activity: Not on file  Lifestyle  . Physical activity    Days per week: Not on file    Minutes per session: Not on file  . Stress: Not on file  Relationships  . Social Herbalist on phone: Not on file    Gets together: Not on file    Attends religious service: Not on file    Active member of club or organization: Not on file    Attends meetings of clubs or organizations: Not on file    Relationship status: Not on file  . Intimate partner violence    Fear of current or ex partner: Not on file    Emotionally abused: Not on file    Physically abused: Not on file    Forced sexual activity: Not on file  Other Topics Concern  . Not on file  Social History Narrative  . Not on file     Review of Systems: General: negative for chills, fever, night sweats or weight changes.  Cardiovascular: negative for chest pain, dyspnea on exertion, edema, orthopnea, palpitations, paroxysmal nocturnal dyspnea or shortness of breath Dermatological: negative for rash Respiratory: negative for cough or wheezing Urologic: negative for hematuria Abdominal: negative for nausea, vomiting, diarrhea, bright red blood  per rectum, melena, or hematemesis Neurologic: negative for visual changes, syncope, or dizziness All other systems reviewed and are otherwise negative except as noted above.    Blood pressure 106/74, pulse (!) 123, height 5\' 1"  (1.549 m), weight 173 lb 9.6 oz (78.7 kg), SpO2 97 %.  General appearance: alert and no distress Neck: no adenopathy, no carotid bruit, no JVD, supple, symmetrical, trachea midline and thyroid not enlarged, symmetric, no tenderness/mass/nodules Lungs: clear to auscultation bilaterally Heart: regular rate and rhythm, S1, S2 normal, no murmur, click, rub or gallop Extremities: extremities normal, atraumatic, no cyanosis or edema Pulses: 2+ and symmetric Skin: Skin  color, texture, turgor normal. No rashes or lesions Neurologic: Alert and oriented X 3, normal strength and tone. Normal symmetric reflexes. Normal coordination and gait  EKG sinus tachycardia at 123 with low limb voltage and septal Q waves.  I personally reviewed this EKG.  ASSESSMENT AND PLAN:   Essential hypertension History of essential hypertension blood pressure is measured today at 106/74.  She is on lisinopril and chlorthalidone.  Hyperlipidemia History of hyperlipidemia on statin therapy.  We will check a lipid liver profile.      Lorretta Harp MD FACP,FACC,FAHA, [facility] 06/03/2019 4:55 PM

## 2019-06-03 NOTE — Assessment & Plan Note (Signed)
History of essential hypertension blood pressure is measured today at 106/74.  She is on lisinopril and chlorthalidone.

## 2019-06-03 NOTE — Patient Instructions (Signed)
Medication Instructions:  Your physician recommends that you continue on your current medications as directed. Please refer to the Current Medication list given to you today.  If you need a refill on your cardiac medications before your next appointment, please call your pharmacy.   Lab work: FASTING Lipids and Hepatic Function If you have labs (blood work) drawn today and your tests are completely normal, you will receive your results only by: MyChart Message (if you have MyChart) OR A paper copy in the mail If you have any lab test that is abnormal or we need to change your treatment, we will call you to review the results.  Testing/Procedures: NONE  Follow-Up: At [facility], you and your health needs are our priority.  As part of our continuing mission to provide you with exceptional heart care, we have created designated Provider Care Teams.  These Care Teams include your primary Cardiologist (physician) and Advanced Practice Providers (APPs -  Physician Assistants and Nurse Practitioners) who all work together to provide you with the care you need, when you need it. You may see Dr Gwenlyn Found or one of the following Advanced Practice Providers on your designated Care Team:    Kerin Ransom, PA-C  [place], [place]  Coletta Memos, [place] Your physician wants you to follow-up in: 3 months with a PA and 1 year with Dr Gwenlyn Found.

## 2019-06-13 ENCOUNTER — Telehealth: Payer: Self-pay | Admitting: Cardiovascular Disease

## 2019-06-13 NOTE — Telephone Encounter (Signed)
New Message    Pt is calling and says she had blood work done at her [facility] office in the middle of October and she is wondering if she needs to get blood work again because Dr Gwenlyn Found Ordered her to do some Labs    Please call back

## 2019-06-14 NOTE — Telephone Encounter (Signed)
Called pt PCP to check to see if she had Lipids/Hepatic function labs drawn in October. Those labs were not drawn. Called pt to let her know that Dr Gwenlyn Found still needs her to come get her fasting labs drawn. Pt verbalized understanding.

## 2019-06-17 LAB — HEPATIC FUNCTION PANEL
ALT: 12 IU/L (ref 0–32)
AST: 15 IU/L (ref 0–40)
Albumin: 4.8 g/dL (ref 3.8–4.8)
Alkaline Phosphatase: 92 IU/L (ref 39–117)
Bilirubin Total: <0.2 mg/dL (ref 0.0–1.2)
Bilirubin, Direct: 0.07 mg/dL (ref 0.00–0.40)
Total Protein: 7.2 g/dL (ref 6.0–8.5)

## 2019-06-17 LAB — TSH+T4F+T3FREE
Free T4: 1.18 ng/dL (ref 0.82–1.77)
T3, Free: 3 pg/mL (ref 2.0–4.4)
TSH: 2.54 u[IU]/mL (ref 0.450–4.500)

## 2019-06-17 LAB — LIPID PANEL
Chol/HDL Ratio: 2.9 ratio (ref 0.0–4.4)
Cholesterol, Total: 174 mg/dL (ref 100–199)
HDL: 61 mg/dL (ref >39)
LDL Chol Calc (NIH): 95 mg/dL (ref 0–99)
Triglycerides: 101 mg/dL (ref 0–149)
VLDL Cholesterol Cal: 18 mg/dL (ref 5–40)

## 2019-06-24 ENCOUNTER — Telehealth: Payer: Self-pay | Admitting: Cardiovascular Disease

## 2019-06-24 NOTE — Telephone Encounter (Signed)
Pt advised her lab results.. Lipid, hepatic, and thyroid.

## 2019-06-24 NOTE — Telephone Encounter (Signed)
Patient is calling for lab results.

## 2019-08-19 ENCOUNTER — Other Ambulatory Visit: Payer: Self-pay

## 2019-08-19 ENCOUNTER — Ambulatory Visit (INDEPENDENT_AMBULATORY_CARE_PROVIDER_SITE_OTHER): Payer: Medicare (Managed Care) | Admitting: Nurse Practitioner

## 2019-08-19 ENCOUNTER — Encounter: Payer: Self-pay | Admitting: Nurse Practitioner

## 2019-08-19 VITALS — BP 124/82 | HR 106 | Temp 98.4°F | Ht 61.0 in | Wt 159.4 lb

## 2019-08-19 DIAGNOSIS — E538 Deficiency of other specified B group vitamins: Secondary | ICD-10-CM

## 2019-08-19 DIAGNOSIS — I10 Essential (primary) hypertension: Secondary | ICD-10-CM | POA: Diagnosis not present

## 2019-08-19 DIAGNOSIS — Z7689 Persons encountering health services in other specified circumstances: Secondary | ICD-10-CM | POA: Diagnosis not present

## 2019-08-19 DIAGNOSIS — E559 Vitamin D deficiency, unspecified: Secondary | ICD-10-CM | POA: Diagnosis not present

## 2019-08-19 DIAGNOSIS — E785 Hyperlipidemia, unspecified: Secondary | ICD-10-CM

## 2019-08-19 MED ORDER — HYDRALAZINE HCL 10 MG PO TABS: ORAL_TABLET | ORAL | 6 refills | Status: DC

## 2019-08-19 MED ORDER — OMEPRAZOLE 40 MG PO CPDR: 40.0000 mg | DELAYED_RELEASE_CAPSULE | Freq: Every day | ORAL | 6 refills | Status: DC

## 2019-08-19 MED ORDER — ATORVASTATIN CALCIUM 20 MG PO TABS: 20.0000 mg | ORAL_TABLET | Freq: Every day | ORAL | 6 refills | Status: DC

## 2019-08-19 MED ORDER — CHLORTHALIDONE 25 MG PO TABS: 25.0000 mg | ORAL_TABLET | Freq: Every day | ORAL | 6 refills | Status: DC

## 2019-08-19 MED ORDER — LISINOPRIL 10 MG PO TABS: 10.0000 mg | ORAL_TABLET | Freq: Every day | ORAL | 6 refills | Status: DC

## 2019-08-19 NOTE — Patient Instructions (Addendum)
Keep food and blood pressure journal bringing to next appointment May take OTC Pepcid 20mg  30 minutes prior to bedtime You have completed labs today and abnormal will be called to you Follow up in 1 month for BP and Gerd.

## 2019-08-19 NOTE — Progress Notes (Signed)
New Patient Office Visit  Subjective:  Patient ID: Tracey Saunders, female    DOB: 09/29/1955  Age: 65 y.o. MRN: [id number]  CC:  Chief Complaint  Patient presents with  . Establish Care    Has concerns of nausea, hx of gallstones     HPI Tracey Saunders is a 64 y.o. caucasian female presenting to establish care. Pt recently moved to the area near sister. She moved from [facility] where her daughter, son in law, and grandchildren lived. She reported some increased stress over past years r/t some family dynamics in [place]. She reports recently dx with gallstones that did not require surgery. Disease prevention screening completed and pt does not desire any vaccinations today however time discussing Flu, PNX, shingles, Tdap, and COVID vaccination risk vs benefits. She declined mammogram, or GYN referral stating no desire and h/o complete hysterectomy. She would like to consider all stated at subsequent visits. The pt denied cP/CT, palpations, GU/GI sxs, pain, falls, and/or sob.   The pt is most concerned with blood pressure control reporting that no "DR." has been able to control it, and nausea/GERD for >5 years with h/o 1 foot of bowel removed r/t diverticulosis disease (as reported by the pt). She also has a h/o B12 deficiency never had EGD.    Past Medical History:  Diagnosis Date  . GERD (gastroesophageal reflux disease)   . Hyperlipidemia   . Hypertension     H/O total hysterectomy, HTN, Hyperlipidemia, GERD, Chronic Nausea, recent DX of gallstones without surgery.  No family history collected today.  Social History   Socioeconomic History  . Marital status: Unknown    Spouse name: Not on file  . Number of children: Not on file  . Years of education: Not on file  . Highest education level: Not on file  Occupational History  . Not on file  Tobacco Use  . Smoking status: Current Every Day Smoker    Packs/day: 0.50    Types: Cigarettes  . Smokeless tobacco: Never Used  Substance and  Sexual Activity  . Alcohol use: Not Currently  . Drug use: Never  . Sexual activity: Not on file  Other Topics Concern  . Not on file  Social History Narrative  . Not on file   Social Determinants of Health   Financial Resource Strain:   . Difficulty of Paying Living Expenses: Not on file  Food Insecurity:   . Worried About Charity fundraiser in the Last Year: Not on file  . Ran Out of Food in the Last Year: Not on file  Transportation Needs:   . Lack of Transportation (Medical): Not on file  . Lack of Transportation (Non-Medical): Not on file  Physical Activity:   . Days of Exercise per Week: Not on file  . Minutes of Exercise per Session: Not on file  Stress:   . Feeling of Stress : Not on file  Social Connections:   . Frequency of Communication with Friends and Family: Not on file  . Frequency of Social Gatherings with Friends and Family: Not on file  . Attends Religious Services: Not on file  . Active Member of Clubs or Organizations: Not on file  . Attends Archivist Meetings: Not on file  . Marital Status: Not on file  Intimate Partner Violence:   . Fear of Current or Ex-Partner: Not on file  . Emotionally Abused: Not on file  . Physically Abused: Not on file  . Sexually Abused: Not on  file    ROS Review of Systems  All other systems reviewed and are negative.   Objective:   Today's Vitals: BP 124/82   Pulse (!) 106   Temp 98.4 F (36.9 C) (Oral)   Ht 5\' 1"  (1.549 m)   Wt 159 lb 6 oz (72.3 kg)   SpO2 98%   BMI 30.11 kg/m   Physical Exam Vitals and nursing note reviewed. Chaperone present: declined, no desire for GYN referal, h/o complete hysterectomy.  Constitutional:      Appearance: Normal appearance. She is well-developed, well-groomed and normal weight.  HENT:     Head: Normocephalic.     Nose: Nose normal.     Mouth/Throat:     Mouth: Mucous membranes are moist.  Eyes:     General: Lids are normal. Lids are everted, no foreign  bodies appreciated.     Extraocular Movements: Extraocular movements intact.     Conjunctiva/sclera: Conjunctivae normal.     Pupils: Pupils are equal, round, and reactive to light.  Neck:     Thyroid: No thyroid mass, thyromegaly or thyroid tenderness.     Vascular: Normal carotid pulses. No carotid bruit or JVD.     Trachea: Trachea normal.  Cardiovascular:     Rate and Rhythm: Normal rate and regular rhythm.     Pulses: Normal pulses.     Heart sounds: Normal heart sounds, S1 normal and S2 normal.  Pulmonary:     Effort: Pulmonary effort is normal.     Breath sounds: Normal breath sounds.  Chest:     Comments: Decline examination or mammogram Abdominal:     General: Abdomen is flat. Bowel sounds are normal.     Palpations: Abdomen is soft.     Tenderness: There is no abdominal tenderness. There is no right CVA tenderness, left CVA tenderness, guarding or rebound.  Musculoskeletal:        General: Normal range of motion.     Cervical back: Normal range of motion and neck supple.  Lymphadenopathy:     Cervical: No cervical adenopathy.     Right cervical: No superficial, deep or posterior cervical adenopathy.    Left cervical: No superficial, deep or posterior cervical adenopathy.  Skin:    General: Skin is warm.     Capillary Refill: Capillary refill takes less than 2 seconds.  Neurological:     General: No focal deficit present.     Mental Status: She is alert and oriented to person, place, and time.     Cranial Nerves: Cranial nerves are intact.  Psychiatric:        Attention and Perception: Attention and perception normal.        Mood and Affect: Mood and affect normal.        Speech: Speech normal.        Behavior: Behavior normal. Behavior is cooperative.        Thought Content: Thought content normal.        Cognition and Memory: Cognition normal.        Judgment: Judgment normal.     Assessment & Plan:  You have established care as a patient with our clinic  today. As discussed Keep food and blood pressure journal  (2-3 hours after AM dose) bringing to next appointment May take OTC Pepcid 20mg  30 minutes prior to bedtime, Continue medication as prescribed prior. You have completed labs today and abnormal will be called to you Follow up in 1 month for BP and  Gerd. Consider mammogram, influenza, pneumonia, shingles, tetanus, and covid vaccination.    Problem List Items Addressed This Visit      Cardiovascular and Mediastinum   Essential hypertension   Relevant Medications   atorvastatin (LIPITOR) 20 MG tablet   chlorthalidone (HYGROTON) 25 MG tablet   hydrALAZINE (APRESOLINE) 10 MG tablet   lisinopril (ZESTRIL) 10 MG tablet   Other Relevant Orders   COMPLETE METABOLIC PANEL WITH GFR   CBC with Differential/Platelet   CBC with Differential/Platelet   COMPLETE METABOLIC PANEL WITH GFR     Other   Hyperlipidemia   Relevant Medications   atorvastatin (LIPITOR) 20 MG tablet   chlorthalidone (HYGROTON) 25 MG tablet   hydrALAZINE (APRESOLINE) 10 MG tablet   lisinopril (ZESTRIL) 10 MG tablet   Other Relevant Orders   Lipid panel   Lipid panel   Vitamin B12 deficiency   Relevant Orders   Vitamin B12   Vitamin B12   Vitamin D deficiency    Other Visit Diagnoses    Establishing care with new doctor, encounter for    -  Primary   Relevant Orders   COMPLETE METABOLIC PANEL WITH GFR   CBC with Differential/Platelet   Lipid panel   Vitamin B12   CBC with Differential/Platelet   COMPLETE METABOLIC PANEL WITH GFR   Lipid panel      Outpatient Encounter Medications as of 08/19/2019  Medication Sig  . atorvastatin (LIPITOR) 20 MG tablet Take 1 tablet (20 mg total) by mouth daily.  . chlorthalidone (HYGROTON) 25 MG tablet Take 1 tablet (25 mg total) by mouth daily.  . hydrALAZINE (APRESOLINE) 10 MG tablet Only take in the afternoon or evening if you have a blood pressure spike and you have already taken your other 2 blood pressure pills  that day.  . lisinopril (ZESTRIL) 10 MG tablet Take 1 tablet (10 mg total) by mouth daily.  . montelukast (SINGULAIR) 10 MG tablet   . omeprazole (PRILOSEC) 40 MG capsule Take 1 capsule (40 mg total) by mouth daily.  . ondansetron (ZOFRAN-ODT) 4 MG disintegrating tablet Take 4 mg by mouth every 8 (eight) hours as needed for nausea or vomiting.  Marland Kitchen QUEtiapine (SEROQUEL) 25 MG tablet Take by mouth.  . solifenacin (VESICARE) 5 MG tablet   . Vitamin D, Ergocalciferol, (DRISDOL) 1.25 MG (50000 UT) CAPS capsule Take by mouth.  . zolpidem (AMBIEN) 5 MG tablet   . [DISCONTINUED] atorvastatin (LIPITOR) 20 MG tablet   . [DISCONTINUED] chlorthalidone (HYGROTON) 25 MG tablet   . [DISCONTINUED] hydrALAZINE (APRESOLINE) 10 MG tablet Only take in the afternoon or evening if you have a blood pressure spike and you have already taken your other 2 blood pressure pills that day.  . [DISCONTINUED] lisinopril (ZESTRIL) 10 MG tablet   . [DISCONTINUED] omeprazole (PRILOSEC) 40 MG capsule   . [DISCONTINUED] omeprazole (PRILOSEC) 40 MG capsule Take 40 mg by mouth daily.   No facility-administered encounter medications on file as of 08/19/2019.    Follow-up: Return in about 4 weeks (around 09/16/2019), or BP and GERD.   [name], FNP

## 2019-08-20 LAB — CBC WITH DIFFERENTIAL/PLATELET
Absolute Monocytes: 526 cells/uL (ref 200–950)
Basophils Absolute: 38 cells/uL (ref 0–200)
Basophils Relative: 0.4 %
Eosinophils Absolute: 47 cells/uL (ref 15–500)
Eosinophils Relative: 0.5 %
HCT: 42.2 % (ref 35.0–45.0)
Hemoglobin: 14.1 g/dL (ref 11.7–15.5)
Lymphs Abs: 2059 cells/uL (ref 850–3900)
MCH: 30.6 pg (ref 27.0–33.0)
MCHC: 33.4 g/dL (ref 32.0–36.0)
MCV: 91.5 fL (ref 80.0–100.0)
MPV: 11.3 fL (ref 7.5–12.5)
Monocytes Relative: 5.6 %
Neutro Abs: 6730 cells/uL (ref 1500–7800)
Neutrophils Relative %: 71.6 %
Platelets: 282 10*3/uL (ref 140–400)
RBC: 4.61 10*6/uL (ref 3.80–5.10)
RDW: 12.8 % (ref 11.0–15.0)
Total Lymphocyte: 21.9 %
WBC: 9.4 10*3/uL (ref 3.8–10.8)

## 2019-08-20 LAB — COMPLETE METABOLIC PANEL WITH GFR
AG Ratio: 1.7 (calc) (ref 1.0–2.5)
ALT: 10 U/L (ref 6–29)
AST: 15 U/L (ref 10–35)
Albumin: 4.6 g/dL (ref 3.6–5.1)
Alkaline phosphatase (APISO): 82 U/L (ref 37–153)
BUN: 15 mg/dL (ref 7–25)
CO2: 21 mmol/L (ref 20–32)
Calcium: 10 mg/dL (ref 8.6–10.4)
Chloride: 103 mmol/L (ref 98–110)
Creat: 0.96 mg/dL (ref 0.50–0.99)
GFR, Est African American: 72 mL/min/{1.73_m2} (ref >=60)
GFR, Est Non African American: 62 mL/min/{1.73_m2} (ref >=60)
Globulin: 2.7 g/dL (calc) (ref 1.9–3.7)
Glucose, Bld: 88 mg/dL (ref 65–99)
Potassium: 4.4 mmol/L (ref 3.5–5.3)
Sodium: 139 mmol/L (ref 135–146)
Total Bilirubin: 0.3 mg/dL (ref 0.2–1.2)
Total Protein: 7.3 g/dL (ref 6.1–8.1)

## 2019-08-20 LAB — LIPID PANEL
Cholesterol: 195 mg/dL (ref <200)
HDL: 49 mg/dL — ABNORMAL LOW (ref >=50)
LDL Cholesterol (Calc): 114 mg/dL (calc) — ABNORMAL HIGH
Non-HDL Cholesterol (Calc): 146 mg/dL (calc) — ABNORMAL HIGH (ref <130)
Total CHOL/HDL Ratio: 4 (calc) (ref <5.0)
Triglycerides: 197 mg/dL — ABNORMAL HIGH (ref <150)

## 2019-08-20 LAB — VITAMIN B12: Vitamin B-12: 387 pg/mL (ref 200–1100)

## 2019-08-20 NOTE — Addendum Note (Signed)
Addended by: Ishmael Holter on: 08/20/2019 11:07 AM   Modules accepted: Orders

## 2019-09-09 ENCOUNTER — Other Ambulatory Visit: Payer: Self-pay

## 2019-09-09 ENCOUNTER — Ambulatory Visit (INDEPENDENT_AMBULATORY_CARE_PROVIDER_SITE_OTHER): Payer: Medicare (Managed Care) | Admitting: Nurse Practitioner

## 2019-09-09 ENCOUNTER — Encounter: Payer: Self-pay | Admitting: Nurse Practitioner

## 2019-09-09 VITALS — BP 128/70 | HR 86 | Temp 97.7°F | Resp 18 | Ht 61.0 in | Wt 157.4 lb

## 2019-09-09 DIAGNOSIS — R110 Nausea: Secondary | ICD-10-CM

## 2019-09-09 DIAGNOSIS — E559 Vitamin D deficiency, unspecified: Secondary | ICD-10-CM

## 2019-09-09 DIAGNOSIS — E538 Deficiency of other specified B group vitamins: Secondary | ICD-10-CM

## 2019-09-09 DIAGNOSIS — I10 Essential (primary) hypertension: Secondary | ICD-10-CM

## 2019-09-09 DIAGNOSIS — F419 Anxiety disorder, unspecified: Secondary | ICD-10-CM

## 2019-09-09 DIAGNOSIS — K219 Gastro-esophageal reflux disease without esophagitis: Secondary | ICD-10-CM

## 2019-09-09 DIAGNOSIS — Z09 Encounter for follow-up examination after completed treatment for conditions other than malignant neoplasm: Secondary | ICD-10-CM

## 2019-09-09 DIAGNOSIS — E785 Hyperlipidemia, unspecified: Secondary | ICD-10-CM

## 2019-09-09 DIAGNOSIS — G4700 Insomnia, unspecified: Secondary | ICD-10-CM

## 2019-09-09 HISTORY — DX: Insomnia, unspecified: G47.00

## 2019-09-09 HISTORY — DX: Anxiety disorder, unspecified: F41.9

## 2019-09-09 MED ORDER — ONDANSETRON 4 MG PO TBDP: 4.0000 mg | ORAL_TABLET | Freq: Three times a day (TID) | ORAL | 0 refills | Status: DC | PRN

## 2019-09-09 NOTE — Progress Notes (Addendum)
Acute Office Visit  Subjective:    Patient ID: La Brozyna, female    DOB: 11-04-1955, 64 y.o.   MRN: [id number]  Chief Complaint  Patient presents with  . Follow-up    htn and stomach issues    HPI Patient is in today for follow up for GERD after treatment initiated and home blood pressure log. She was instruceted after 4 weeks ago establishing care to keep and bring a food and blood pressure journal. B/P readings from home average 130/80 at baseline. Food diary sausage, lasagna, v8, pot pie.   Other issues/concerns: acid taste in mouth, continued all day every day nausea-refusing nausea reducing medications, dentures are uncomfortable, wants to ask that Clonopin will be refilled long term when she needs fills she says started years ago in [facility] after hospitalization when she lost memory under high stress and thought she had dementia, she stated the Clonopin is for her dementia and it helps not declined over the years, desires increase in Ambien for bedtime stated by 5 mg increase worsened insomnia. Nasal allergies worsened this week not using antiallergen medication as she had before no nasal Flonase use. No other sxs, no fever/chills, sinus pressure, h/a, loss of taste or smell.  She admits that she has "plenty" of Clonopin from fills while living in [place]. Discussed psychiatry and management of this medication not to be filled chronic or long term by PCP.    No cp/ct, other than stated gu/gi sxs, sob, edema, pain, or falls.    Past Medical History:  Diagnosis Date  . GERD (gastroesophageal reflux disease)   . Hyperlipidemia   . Hypertension     Past Surgical History:  Procedure Laterality Date  . ABDOMINAL HYSTERECTOMY      Family History  Family history unknown: Yes    Social History   Socioeconomic History  . Marital status: Unknown    Spouse name: Not on file  . Number of children: Not on file  . Years of education: Not on file  . Highest education level: Not on file   Occupational History  . Not on file  Tobacco Use  . Smoking status: Current Every Day Smoker    Packs/day: 0.50    Types: Cigarettes  . Smokeless tobacco: Never Used  Substance and Sexual Activity  . Alcohol use: Not Currently  . Drug use: Never  . Sexual activity: Not on file  Other Topics Concern  . Not on file  Social History Narrative  . Not on file   Social Determinants of Health   Financial Resource Strain:   . Difficulty of Paying Living Expenses: Not on file  Food Insecurity:   . Worried About Charity fundraiser in the Last Year: Not on file  . Ran Out of Food in the Last Year: Not on file  Transportation Needs:   . Lack of Transportation (Medical): Not on file  . Lack of Transportation (Non-Medical): Not on file  Physical Activity:   . Days of Exercise per Week: Not on file  . Minutes of Exercise per Session: Not on file  Stress:   . Feeling of Stress : Not on file  Social Connections:   . Frequency of Communication with Friends and Family: Not on file  . Frequency of Social Gatherings with Friends and Family: Not on file  . Attends Religious Services: Not on file  . Active Member of Clubs or Organizations: Not on file  . Attends Archivist Meetings: Not on  file  . Marital Status: Not on file  Intimate Partner Violence:   . Fear of Current or Ex-Partner: Not on file  . Emotionally Abused: Not on file  . Physically Abused: Not on file  . Sexually Abused: Not on file    Outpatient Medications Prior to Visit  Medication Sig Dispense Refill  . atorvastatin (LIPITOR) 20 MG tablet Take 1 tablet (20 mg total) by mouth daily. 30 tablet 6  . chlorthalidone (HYGROTON) 25 MG tablet Take 1 tablet (25 mg total) by mouth daily. 30 tablet 6  . hydrALAZINE (APRESOLINE) 10 MG tablet Only take in the afternoon or evening if you have a blood pressure spike and you have already taken your other 2 blood pressure pills that day. 30 tablet 6  . lisinopril (ZESTRIL) 10  MG tablet Take 1 tablet (10 mg total) by mouth daily. 30 tablet 6  . montelukast (SINGULAIR) 10 MG tablet     . omeprazole (PRILOSEC) 40 MG capsule Take 1 capsule (40 mg total) by mouth daily. 30 capsule 6  . QUEtiapine (SEROQUEL) 25 MG tablet Take by mouth.    . solifenacin (VESICARE) 5 MG tablet     . Vitamin D, Ergocalciferol, (DRISDOL) 1.25 MG (50000 UT) CAPS capsule Take by mouth.    . zolpidem (AMBIEN) 5 MG tablet     . ondansetron (ZOFRAN-ODT) 4 MG disintegrating tablet Take 4 mg by mouth every 8 (eight) hours as needed for nausea or vomiting.     No facility-administered medications prior to visit.    No Known Allergies  Review of Systems  All other systems reviewed and are negative.      Objective:    Physical Exam Vitals and nursing note reviewed.  Constitutional:      Appearance: Normal appearance.  HENT:     Head: Normocephalic.     Nose: Nose normal. No mucosal edema, congestion or rhinorrhea.     Right Sinus: No maxillary sinus tenderness or frontal sinus tenderness.     Left Sinus: No maxillary sinus tenderness or frontal sinus tenderness.     Mouth/Throat:     Lips: Pink.     Mouth: Mucous membranes are moist.     Dentition: Has dentures. No dental tenderness or gingival swelling.     Tongue: No lesions. Tongue does not deviate from midline.     Pharynx: Oropharynx is clear. No posterior oropharyngeal erythema.  Eyes:     Extraocular Movements: Extraocular movements intact.     Conjunctiva/sclera: Conjunctivae normal.     Pupils: Pupils are equal, round, and reactive to light.  Cardiovascular:     Rate and Rhythm: Normal rate.  Pulmonary:     Effort: Pulmonary effort is normal.  Musculoskeletal:     Cervical back: Normal range of motion.  Lymphadenopathy:     Cervical: No cervical adenopathy.  Skin:    General: Skin is warm.  Neurological:     General: No focal deficit present.     Mental Status: She is alert and oriented to person, place, and time.   Psychiatric:        Attention and Perception: Attention and perception normal.        Mood and Affect: Affect normal. Mood is anxious.        Speech: Speech normal.        Behavior: Behavior is cooperative.        Cognition and Memory: Cognition normal.        Judgment: Judgment is  not impulsive.     BP 128/70 (BP Location: Left Arm, Patient Position: Sitting, Cuff Size: Normal)   Pulse 86   Temp 97.7 F (36.5 C) (Oral)   Resp 18   Ht 5\' 1"  (1.549 m)   Wt 157 lb 6.4 oz (71.4 kg)   SpO2 100%   BMI 29.74 kg/m  Wt Readings from Last 3 Encounters:  09/09/19 157 lb 6.4 oz (71.4 kg)  08/19/19 159 lb 6 oz (72.3 kg)  06/03/19 173 lb 9.6 oz (78.7 kg)    Health Maintenance Due  Topic Date Due  . PAP SMEAR-Modifier  05/26/1975    There are no preventive care reminders to display for this patient.   Lab Results  Component Value Date   TSH 2.540 06/17/2019   Lab Results  Component Value Date   WBC 9.4 08/19/2019   HGB 14.1 08/19/2019   HCT 42.2 08/19/2019   MCV 91.5 08/19/2019   PLT 282 08/19/2019   Lab Results  Component Value Date   NA 139 08/19/2019   K 4.4 08/19/2019   CO2 21 08/19/2019   GLUCOSE 88 08/19/2019   BUN 15 08/19/2019   CREATININE 0.96 08/19/2019   BILITOT 0.3 08/19/2019   ALKPHOS 92 06/17/2019   AST 15 08/19/2019   ALT 10 08/19/2019   PROT 7.3 08/19/2019   ALBUMIN 4.8 06/17/2019   CALCIUM 10.0 08/19/2019   Lab Results  Component Value Date   CHOL 195 08/19/2019   Lab Results  Component Value Date   HDL 49 (L) 08/19/2019   Lab Results  Component Value Date   LDLCALC 114 (H) 08/19/2019   Lab Results  Component Value Date   TRIG 197 (H) 08/19/2019   Lab Results  Component Value Date   CHOLHDL 4.0 08/19/2019   No results found for: HGBA1C     Assessment & Plan:  Upon check out pt requested nausea RX. Insomnia, anxiety, long term clonopin: Psyche referral for treatment and management. Pt may need to be weaned.  Continued GERD no  improvement with chronic nausea, acid taste in mouth: GI referral for EGD and suggestions for treatment Ill fitting dentures: dentist Continue plan as prior defined since est. Care with clinic. Blood pressure stable continue to home monitor as prior instructed.  Avoid acidic foods such as your diet of sausage, lasagna, V8 juice  Problem List Items Addressed This Visit      Cardiovascular and Mediastinum   Essential hypertension   Relevant Orders   COMPLETE METABOLIC PANEL WITH GFR   CBC with Differential/Platelet   Lipid panel     Digestive   Gastroesophageal reflux disease   Relevant Medications   ondansetron (ZOFRAN ODT) 4 MG disintegrating tablet   Other Relevant Orders   Ambulatory referral to Gastroenterology     Other   Vitamin B12 deficiency   Relevant Orders   Vitamin B12   Vitamin D deficiency   Relevant Orders   VITAMIN D 25 Hydroxy (Vit-D Deficiency, Fractures)    Other Visit Diagnoses    Follow-up exam after treatment    -  Primary   Anxiety disorder, unspecified type       Relevant Orders   Ambulatory referral to Psychiatry   Insomnia, unspecified type       Relevant Orders   Ambulatory referral to Psychiatry   Nausea       Relevant Medications   ondansetron (ZOFRAN ODT) 4 MG disintegrating tablet      Follow up in 6  months for general follow up BP, GERD, psyche and GI referral etc.     [name], FNP

## 2019-09-13 ENCOUNTER — Telehealth: Payer: Self-pay | Admitting: Nurse Practitioner

## 2019-09-13 NOTE — Telephone Encounter (Signed)
Patient says that the medication that crystal called in will not work for nausea, would like promethazine would be better and would work for her   [phone]

## 2019-09-13 NOTE — Telephone Encounter (Signed)
See note

## 2019-09-16 ENCOUNTER — Ambulatory Visit: Payer: Medicare (Managed Care) | Admitting: Nurse Practitioner

## 2019-09-16 ENCOUNTER — Telehealth: Payer: Self-pay

## 2019-09-16 ENCOUNTER — Other Ambulatory Visit: Payer: Self-pay | Admitting: Nurse Practitioner

## 2019-09-16 DIAGNOSIS — R110 Nausea: Secondary | ICD-10-CM

## 2019-09-16 NOTE — Telephone Encounter (Signed)
Pt called and stated that the zofran does not work for her nausea. Pt states she had a bottle left over from another dr with no relief. Please advise.

## 2019-09-17 NOTE — Telephone Encounter (Signed)
I would prefer her not to take phenergan

## 2019-10-14 ENCOUNTER — Encounter: Payer: Self-pay | Admitting: Physician Assistant

## 2019-10-14 ENCOUNTER — Ambulatory Visit (INDEPENDENT_AMBULATORY_CARE_PROVIDER_SITE_OTHER): Payer: Medicare (Managed Care) | Admitting: Physician Assistant

## 2019-10-14 VITALS — BP 144/90 | HR 88 | Temp 98.3°F | Ht 61.75 in | Wt 155.5 lb

## 2019-10-14 DIAGNOSIS — R1013 Epigastric pain: Secondary | ICD-10-CM

## 2019-10-14 DIAGNOSIS — R634 Abnormal weight loss: Secondary | ICD-10-CM | POA: Diagnosis not present

## 2019-10-14 DIAGNOSIS — K5900 Constipation, unspecified: Secondary | ICD-10-CM

## 2019-10-14 DIAGNOSIS — R110 Nausea: Secondary | ICD-10-CM | POA: Diagnosis not present

## 2019-10-14 DIAGNOSIS — R194 Change in bowel habit: Secondary | ICD-10-CM | POA: Diagnosis not present

## 2019-10-14 MED ORDER — METOCLOPRAMIDE HCL 10 MG PO TABS: ORAL_TABLET | ORAL | 0 refills | Status: DC

## 2019-10-14 MED ORDER — SUTAB 1479-225-188 MG PO TABS: 1.0000 | ORAL_TABLET | Freq: Once | ORAL | 0 refills | Status: AC

## 2019-10-14 NOTE — Patient Instructions (Addendum)
If you are age 66 or older, your body mass index should be between 23-30. Your Body mass index is 28.67 kg/m. If this is out of the aforementioned range listed, please consider follow up with your Primary Care Provider.  If you are age 86 or younger, your body mass index should be between 19-25. Your Body mass index is 28.67 kg/m. If this is out of the aformentioned range listed, please consider follow up with your Primary Care Provider.   We have given you samples of the following medication to take: Linzess 145 mcg daily.  We have sent the following medications to your pharmacy for you to pick up at your convenience: Reglan 10 mg take 30 minutes before each prep.

## 2019-10-14 NOTE — Progress Notes (Signed)
Assessment and plan reviewed 

## 2019-10-14 NOTE — Progress Notes (Signed)
Chief Complaint: Abdominal pain, Bloating, Constipation  HPI:    Tracey Saunders is a 64 year old female with a past medical history as listed below including anxiety and reflux, who was referred to me by [name], FNP for a complaint of epigastric pain, bloating and constipation.      08/19/2019 CMP and CBC normal.    09/09/2019 patient saw PCP and was following up for GERD.  At that time her reflux had continued with no improvement in chronic nausea with an acid taste in her mouth.  She was given Zofran.    Today, the patient presents clinic and explains that over the past 3 to 4 months she has been having trouble with a change in bowel habits towards constipation telling me that she only has "a little bit of stool once a week".  Was taking fish oil in order to help have more regular bowel movements and it was working for a while but now it is not helping at all.  Was also on MiraLAX once daily for couple of months but this did not change anything.  Along with this has had severe nausea.  Tells me she thinks this was occurring before she got constipated but it comes on and off throughout the day and seems to be worse in the morning when she wakes up.  Has been trying Zofran recently which does not change her symptoms.  Also on Omeprazole 40 mg daily which does not help at all.  Associated symptoms include some early satiety and a weight loss of 20 pounds over the past 3 to 4 months.    Describes history of diverticulitis and a partial colectomy a greater than 10 years ago in [place].  Then describes a mental/physical breakdown 7 years ago and tells me she has limited memory due to this.    Denies fever, chills or blood in her stool.  Past Medical History:  Diagnosis Date  . Anxiety disorder 09/09/2019  . GERD (gastroesophageal reflux disease)   . Hyperlipidemia   . Hypertension   . Insomnia 09/09/2019    Past Surgical History:  Procedure Laterality Date  . ABDOMINAL HYSTERECTOMY       Current Outpatient Medications  Medication Sig Dispense Refill  . atorvastatin (LIPITOR) 20 MG tablet Take 1 tablet (20 mg total) by mouth daily. 30 tablet 6  . chlorthalidone (HYGROTON) 25 MG tablet Take 1 tablet (25 mg total) by mouth daily. 30 tablet 6  . lisinopril (ZESTRIL) 10 MG tablet Take 1 tablet (10 mg total) by mouth daily. 30 tablet 6  . montelukast (SINGULAIR) 10 MG tablet Take 10 mg by mouth at bedtime.     Marland Kitchen omeprazole (PRILOSEC) 40 MG capsule Take 1 capsule (40 mg total) by mouth daily. 30 capsule 6  . QUEtiapine (SEROQUEL) 25 MG tablet Take 25 mg by mouth 2 (two) times daily.     . solifenacin (VESICARE) 5 MG tablet Take 5 mg by mouth daily.     . Vitamin D, Ergocalciferol, (DRISDOL) 1.25 MG (50000 UT) CAPS capsule Take 50,000 Units by mouth every 7 (seven) days.     Marland Kitchen zolpidem (AMBIEN) 5 MG tablet Take 10 mg by mouth at bedtime.     . hydrALAZINE (APRESOLINE) 10 MG tablet Only take in the afternoon or evening if you have a blood pressure spike and you have already taken your other 2 blood pressure pills that day. (Patient not taking: Reported on 10/14/2019) 30 tablet 6   No current  facility-administered medications for this visit.    Allergies as of 10/14/2019  . (No Known Allergies)    Family History  Family history unknown: Yes    Social History   Socioeconomic History  . Marital status: Unknown    Spouse name: Not on file  . Number of children: Not on file  . Years of education: Not on file  . Highest education level: Not on file  Occupational History  . Not on file  Tobacco Use  . Smoking status: Current Every Day Smoker    Packs/day: 0.50    Types: Cigarettes  . Smokeless tobacco: Never Used  Substance and Sexual Activity  . Alcohol use: Not Currently  . Drug use: Never  . Sexual activity: Not on file  Other Topics Concern  . Not on file  Social History Narrative  . Not on file   Social Determinants of Health   Financial Resource Strain:    . Difficulty of Paying Living Expenses:   Food Insecurity:   . Worried About Charity fundraiser in the Last Year:   . Arboriculturist in the Last Year:   Transportation Needs:   . Film/video editor (Medical):   Marland Kitchen Lack of Transportation (Non-Medical):   Physical Activity:   . Days of Exercise per Week:   . Minutes of Exercise per Session:   Stress:   . Feeling of Stress :   Social Connections:   . Frequency of Communication with Friends and Family:   . Frequency of Social Gatherings with Friends and Family:   . Attends Religious Services:   . Active Member of Clubs or Organizations:   . Attends Archivist Meetings:   Marland Kitchen Marital Status:   Intimate Partner Violence:   . Fear of Current or Ex-Partner:   . Emotionally Abused:   Marland Kitchen Physically Abused:   . Sexually Abused:     Review of Systems:    Constitutional: No weight loss, fever or chills Skin: No rash Cardiovascular: No chest pain  Respiratory: No SOB  Gastrointestinal: See HPI and otherwise negative Genitourinary: No dysuria  Neurological: No headache Musculoskeletal: No new muscle or joint pain Hematologic: No bleeding Psychiatric: +depression   Physical Exam:  Vital signs: BP (!) 144/90 (BP Location: Left Arm, Patient Position: Sitting, Cuff Size: Normal)   Pulse 88   Temp 98.3 F (36.8 C)   Ht 5' 1.75" (1.568 m) Comment: height measured without shoes  Wt 155 lb 8 oz (70.5 kg)   BMI 28.67 kg/m   Constitutional:   Pleasant Caucasian female appears to be in NAD, Well developed, Well nourished, alert and cooperative Head:  Normocephalic and atraumatic. Eyes:   PEERL, EOMI. No icterus. Conjunctiva pink. Ears:  Normal auditory acuity. Neck:  Supple Throat: Oral cavity and pharynx without inflammation, swelling or lesion.  Respiratory: Respirations even and unlabored. Lungs clear to auscultation bilaterally.   No wheezes, crackles, or rhonchi.  Cardiovascular: Normal S1, S2. No MRG. Regular rate and  rhythm. No peripheral edema, cyanosis or pallor.  Gastrointestinal:  Soft, nondistended, mild generalized ttp, worse in epigastrum. No rebound or guarding. Normal bowel sounds. No appreciable masses or hepatomegaly. Rectal:  Not performed.  Msk:  Symmetrical without gross deformities. Without edema, no deformity or joint abnormality.  Neurologic:  Alert and  oriented x4;  grossly normal neurologically.  Skin:   Dry and intact without significant lesions or rashes. Psychiatric: Demonstrates good judgement and reason without abnormal affect or behaviors.  RELEVANT LABS AND IMAGING: CBC    Component Value Date/Time   WBC 9.4 08/19/2019 1232   RBC 4.61 08/19/2019 1232   HGB 14.1 08/19/2019 1232   HCT 42.2 08/19/2019 1232   PLT 282 08/19/2019 1232   MCV 91.5 08/19/2019 1232   MCH 30.6 08/19/2019 1232   MCHC 33.4 08/19/2019 1232   RDW 12.8 08/19/2019 1232   LYMPHSABS 2,059 08/19/2019 1232   EOSABS 47 08/19/2019 1232   BASOSABS 38 08/19/2019 1232    CMP     Component Value Date/Time   NA 139 08/19/2019 1232   K 4.4 08/19/2019 1232   CL 103 08/19/2019 1232   CO2 21 08/19/2019 1232   GLUCOSE 88 08/19/2019 1232   BUN 15 08/19/2019 1232   CREATININE 0.96 08/19/2019 1232   CALCIUM 10.0 08/19/2019 1232   PROT 7.3 08/19/2019 1232   PROT 7.2 06/17/2019 1103   ALBUMIN 4.8 06/17/2019 1103   AST 15 08/19/2019 1232   ALT 10 08/19/2019 1232   ALKPHOS 92 06/17/2019 1103   BILITOT 0.3 08/19/2019 1232   BILITOT <0.2 06/17/2019 1103   GFRNONAA 62 08/19/2019 1232   GFRAA 72 08/19/2019 1232    Assessment: 1.  Change in bowel habits: Towards constipation over the past few months, history of partial colectomy for the patient in [place]; consider stricture versus other 2.  Epigastric pain: Consider gastritis +/-correlation with constipation 3.  Constipation 4.  Nausea: Over the past few months per the patient, consider relation to constipation +/- gastritis/other 5.  Weight loss: 20 pounds  in the past 3 to 4 months per patient, consider relation to early satiety and epigastric pain/constipation  Plan: 1.  Scheduled the patient for an EGD and colonoscopy with Dr. Henrene Pastor in the [facility].  Did discuss risks, benefits, limitations and alternatives and the patient agrees to proceed.  Patient will be Covid tested 2 days prior to time of procedures. 2.  Prescribed Linzess 145 mcg daily, 30 minutes before breakfast #30 with 3 refills.  Also provided a week of samples. 3.  Continue Omeprazole 40 mg daily for now 4.  Prescribed Reglan 10 mg #2, 1 before each bowel prep 5.  Patient to follow in clinic per recommendations from Dr. Henrene Pastor after time of procedure.  Ellouise Newer, PA-C [facility] 10/14/2019, 11:14 AM  Cc: [name], FNP

## 2019-10-15 ENCOUNTER — Other Ambulatory Visit: Payer: Self-pay

## 2019-10-15 MED ORDER — ATORVASTATIN CALCIUM 20 MG PO TABS: 20.0000 mg | ORAL_TABLET | Freq: Every day | ORAL | 1 refills | Status: DC

## 2019-10-15 MED ORDER — OMEPRAZOLE 40 MG PO CPDR: 40.0000 mg | DELAYED_RELEASE_CAPSULE | Freq: Every day | ORAL | 1 refills | Status: DC

## 2019-10-15 MED ORDER — CHLORTHALIDONE 25 MG PO TABS: 25.0000 mg | ORAL_TABLET | Freq: Every day | ORAL | 1 refills | Status: DC

## 2019-10-15 MED ORDER — MONTELUKAST SODIUM 10 MG PO TABS: 10.0000 mg | ORAL_TABLET | Freq: Every day | ORAL | 1 refills | Status: DC

## 2019-10-23 ENCOUNTER — Telehealth: Payer: Self-pay | Admitting: Nurse Practitioner

## 2019-10-23 NOTE — Telephone Encounter (Signed)
[facility] on [address]  Patient has started smoking again but would like to quit. She would like to try chantix. Please let her know if this can be called in.  CB# [phone]

## 2019-10-23 NOTE — Telephone Encounter (Signed)
Error

## 2019-10-31 ENCOUNTER — Other Ambulatory Visit: Payer: Self-pay

## 2019-10-31 DIAGNOSIS — F17209 Nicotine dependence, unspecified, with unspecified nicotine-induced disorders: Secondary | ICD-10-CM

## 2019-10-31 MED ORDER — VARENICLINE TARTRATE 0.5 MG PO TABS: 0.5000 mg | ORAL_TABLET | Freq: Two times a day (BID) | ORAL | 0 refills | Status: DC

## 2019-11-04 ENCOUNTER — Other Ambulatory Visit: Payer: Self-pay

## 2019-11-04 ENCOUNTER — Telehealth (INDEPENDENT_AMBULATORY_CARE_PROVIDER_SITE_OTHER): Payer: Medicare (Managed Care) | Admitting: Psychiatry

## 2019-11-04 ENCOUNTER — Encounter: Payer: Self-pay | Admitting: Psychiatry

## 2019-11-04 DIAGNOSIS — F3342 Major depressive disorder, recurrent, in full remission: Secondary | ICD-10-CM

## 2019-11-04 NOTE — Progress Notes (Signed)
Psychiatric Initial Adult Assessment   I connected with  Tracey Saunders on 11/04/19 by a video enabled telemedicine application and verified that I am speaking with the correct person using two identifiers.   I discussed the limitations of evaluation and management by telemedicine. The patient expressed understanding and agreed to proceed.    Patient Identification: Tracey Saunders MRN:  [id number] Date of Evaluation:  11/04/2019   Referral Source: PCP  Chief Complaint:   " Only reason I am seeing you is because I have been on this medications for 9 years."  Visit Diagnosis:    ICD-10-CM   1. Recurrent major depressive disorder, in full remission ([place])  F33.42     History of Present Illness: This is a 64 year old female with history of depression and 1 prior psychiatry hospitalization now seen for psychiatric evaluation after being referred by PCP.  Patient informed that 9 years ago she was prescribed Seroquel 25 mg BID during hospitalization in a psychiatry in-patient unit in [place].  She informed that around that time she was undergoing a lot of stress and was very overwhelmed.  She reported that she had lost her husband, was working 2 jobs, was dealing with bankruptcy and was about to lose her land.  All this made her extremely vulnerable and she started to have hallucinations and felt very depressed.  She also acknowledged that she was mixing her pain pills with other prescribed pills like Xanax and Valium. She stated that all this resulted in a mental and physical breakdown which resulted in the psychiatric hospitalization.  She was told that she will need to take Seroquel for rest of her life and that without it she will lose her memory as she has dementia.  She was also informed that if she stops taking it she will have bad withdrawal effects.  She stated that she told this to her PCP when she moved to [place] about 7 months ago from [place].  Her PCP was not sure about this and  therefore referred her to psychiatry.  Patient stated that she wants to know if she needs to keep taking Seroquel 25 mg twice a day for rest of her life. Patient was asked how her mood was in her if she had any more psychotic symptoms.  Patient reported that her mood has been very stable especially after she moved here from [place] 7 months ago.  She stated that she does not sit and dwelve on the past and feels happy and upbeat. She is now living with her sister and they both get along well.  She stated that prior to her moving here she was living with her son and his wife and that was very difficult time for her.  She is glad that she is away from all that. She denied any symptom suggestive of hypomania or mania.  She denied any hallucinations or paranoid ideations. She stated that she stopped misusing any prescription pills or pain pills many years ago.  She takes Ambien 5 mg 2-3 times per week only as needed for difficulty in sleeping. Other than that she is taking Lipitor and lisinopril which she described as "maintenance medications".  She denied any suicidal ideations.  She denied any homicidal ideations. She denied any symptom suggestive of PTSD.    Past Psychiatric History: MDD  Previous Psychotropic Medications: Yes - seroquel  Substance Abuse History in the last 12 months:  No.  Consequences of Substance Abuse: NA  Past Medical History:  Past Medical  History:  Diagnosis Date  . Anxiety disorder 09/09/2019  . Depression   . Diverticulitis   . Fatty liver   . GERD (gastroesophageal reflux disease)   . Hyperlipidemia   . Hypertension   . Insomnia 09/09/2019  . Liver cyst   . Vitamin D deficiency     Past Surgical History:  Procedure Laterality Date  . ABDOMINAL HYSTERECTOMY    . COLON RESECTION     due to diverticulitis    Family Psychiatric History: denied  Family History:  Family History  Family history unknown: Yes    Social History:   Social History    Socioeconomic History  . Marital status: Unknown    Spouse name: Not on file  . Number of children: 1  . Years of education: Not on file  . Highest education level: Not on file  Occupational History  . Occupation: retired  Tobacco Use  . Smoking status: Current Every Day Smoker    Packs/day: 0.50    Types: Cigarettes  . Smokeless tobacco: Never Used  Substance and Sexual Activity  . Alcohol use: Yes    Comment: occasional beer or bloody mary once a week  . Drug use: Never  . Sexual activity: Not on file  Other Topics Concern  . Not on file  Social History Narrative  . Not on file   Social Determinants of Health   Financial Resource Strain:   . Difficulty of Paying Living Expenses:   Food Insecurity:   . Worried About Charity fundraiser in the Last Year:   . Arboriculturist in the Last Year:   Transportation Needs:   . Film/video editor (Medical):   Marland Kitchen Lack of Transportation (Non-Medical):   Physical Activity:   . Days of Exercise per Week:   . Minutes of Exercise per Session:   Stress:   . Feeling of Stress :   Social Connections:   . Frequency of Communication with Friends and Family:   . Frequency of Social Gatherings with Friends and Family:   . Attends Religious Services:   . Active Member of Clubs or Organizations:   . Attends Archivist Meetings:   Marland Kitchen Marital Status:     Additional Social History: Moved to [place] from [place] to live with her sister 7 months ago.  Used to do several different jobs back in [place].  Her husband is deceased.  Has only 1 son who lives in [place] with his family.  Allergies:  No Known Allergies  Metabolic Disorder Labs: No results found for: HGBA1C, MPG No results found for: PROLACTIN Lab Results  Component Value Date   CHOL 195 08/19/2019   TRIG 197 (H) 08/19/2019   HDL 49 (L) 08/19/2019   CHOLHDL 4.0 08/19/2019   LDLCALC 114 (H) 08/19/2019   LDLCALC 95 06/17/2019   Lab Results  Component Value  Date   TSH 2.540 06/17/2019    Therapeutic Level Labs: No results found for: LITHIUM No results found for: CBMZ No results found for: VALPROATE  Current Medications: Current Outpatient Medications  Medication Sig Dispense Refill  . atorvastatin (LIPITOR) 20 MG tablet Take 1 tablet (20 mg total) by mouth daily. 90 tablet 1  . chlorthalidone (HYGROTON) 25 MG tablet Take 1 tablet (25 mg total) by mouth daily. 90 tablet 1  . hydrALAZINE (APRESOLINE) 10 MG tablet Only take in the afternoon or evening if you have a blood pressure spike and you have already taken your other 2  blood pressure pills that day. (Patient not taking: Reported on 10/14/2019) 30 tablet 6  . lisinopril (ZESTRIL) 10 MG tablet Take 1 tablet (10 mg total) by mouth daily. 30 tablet 6  . metoCLOPramide (REGLAN) 10 MG tablet Take one tablet 30 minutes before each prep. 2 tablet 0  . montelukast (SINGULAIR) 10 MG tablet Take 1 tablet (10 mg total) by mouth at bedtime. 90 tablet 1  . omeprazole (PRILOSEC) 40 MG capsule Take 1 capsule (40 mg total) by mouth daily. 90 capsule 1  . QUEtiapine (SEROQUEL) 25 MG tablet Take 25 mg by mouth 2 (two) times daily.     . solifenacin (VESICARE) 5 MG tablet Take 5 mg by mouth daily.     . varenicline (CHANTIX) 0.5 MG tablet Take 1 tablet (0.5 mg total) by mouth 2 (two) times daily. 60 tablet 0  . Vitamin D, Ergocalciferol, (DRISDOL) 1.25 MG (50000 UT) CAPS capsule Take 50,000 Units by mouth every 7 (seven) days.     Marland Kitchen zolpidem (AMBIEN) 5 MG tablet Take 10 mg by mouth at bedtime.      No current facility-administered medications for this visit.    Musculoskeletal: Strength & Muscle Tone: unable to assess due to telemed visit Gait & Station: unable to assess due to telemed visit Patient leans: unable to assess due to telemed visit    Psychiatric Specialty Exam: Review of Systems  There were no vitals taken for this visit.There is no height or weight on file to calculate BMI.  General  Appearance: Fairly Groomed  Eye Contact:  Good  Speech:  Clear and Coherent and Normal Rate  Volume:  Normal  Mood:  Euthymic  Affect:  Congruent  Thought Process:  Goal Directed, Linear and Descriptions of Associations: Intact  Orientation:  Full (Time, Place, and Person)  Thought Content: Logical   Suicidal Thoughts:  No  Homicidal Thoughts:  No  Memory:  Recent;   Good Remote;   Good  Judgement:  Fair  Insight:  Fair  Psychomotor Activity:  Normal  Concentration:  Concentration: Good and Attention Span: Good  Recall:  Good  Fund of Knowledge: Good  Language: Good  Akathisia:  Negative  Handed:  Right  AIMS (if indicated): not done due to TeleMed visit  Assets:  Communication Skills Desire for Improvement Financial Resources/Insurance Housing  ADL's:  Intact  Cognition: WNL  Sleep:  Good     Screenings: PHQ2-9     Office Visit from 08/19/2019 in [facility]  PHQ-2 Total Score  0  PHQ-9 Total Score  1      Assessment and Plan: Patient was informed that if she does not want to continue Seroquel she may safely come off of it.  She has been taking 25 mg twice daily for about 9 years.  Since she has been taking this dose for a long time she was recommended to cut down the dose to 1 tablet at bedtime for 4 weeks and then discontinue.  She was informed that there was no evidence suggesting that she has dementia so she was likely misinformed during her psychiatry hospital stay in [place].  All questions were answered. Patient does not want to take any other medications for depression as she does not believe she needs anything at this point.  1. Recurrent major depressive disorder, in full remission ([place])  -Cut down Seroquel to 25 mg once a day for 4 weeks and then discontinue.  Follow-up with psychiatry as needed.  Derris Millan  [facility] Care, MD 4/26/20211:48 PM

## 2019-11-06 ENCOUNTER — Other Ambulatory Visit: Payer: Self-pay

## 2019-11-06 ENCOUNTER — Ambulatory Visit: Payer: Medicare (Managed Care) | Admitting: Nurse Practitioner

## 2019-11-06 VITALS — BP 110/64 | HR 104 | Temp 97.6°F | Resp 18 | Wt 153.8 lb

## 2019-11-06 DIAGNOSIS — K5900 Constipation, unspecified: Secondary | ICD-10-CM | POA: Diagnosis not present

## 2019-11-06 NOTE — Progress Notes (Signed)
Established Patient Office Visit  Subjective:  Patient ID: Tracey Saunders, female    DOB: 1955/09/08  Age: 64 y.o. MRN: [id number]  CC:  Chief Complaint  Patient presents with  . Abdominal Pain    constipated, cramping, pain near diaphragm    HPI Corrinne Saunders is a 64 year old female presenting for abdominal pain. She was referred to GI on her initial visit to this clinic and below is the note.  Per appt not with GI specialist completed on 10/14/2019, referred by this clinic:  -past medical history as listed below including anxiety and reflux, who was referred to me by [name], FNP for a complaint of epigastric pain, bloating and constipation.      08/19/2019 CMP and CBC normal.    09/09/2019 patient saw PCP and was following up for GERD.  At that time her reflux had continued with no improvement in chronic nausea with an acid taste in her mouth.  She was given Zofran.    Today, the patient presents clinic and explains that over the past 3 to 4 months she has been having trouble with a change in bowel habits towards constipation telling me that she only has "a little bit of stool once a week".  Was taking fish oil in order to help have more regular bowel movements and it was working for a while but now it is not helping at all.  Was also on MiraLAX once daily for couple of months but this did not change anything.  Along with this has had severe nausea.  Tells me she thinks this was occurring before she got constipated but it comes on and off throughout the day and seems to be worse in the morning when she wakes up.  Has been trying Zofran recently which does not change her symptoms.  Also on Omeprazole 40 mg daily which does not help at all.  Associated symptoms include some early satiety and a weight loss of 20 pounds over the past 3 to 4 months.    Describes history of diverticulitis and a partial colectomy a greater than 10 years ago in [place].  Then describes a mental/physical  breakdown 7 years ago and tells me she has limited memory due to this.    Denies fever, chills or blood in her stool. Assessment: 1.  Change in bowel habits: Towards constipation over the past few months, history of partial colectomy for the patient in [place]; consider stricture versus other 2.  Epigastric pain: Consider gastritis +/-correlation with constipation 3.  Constipation 4.  Nausea: Over the past few months per the patient, consider relation to constipation +/- gastritis/other 5.  Weight loss: 20 pounds in the past 3 to 4 months per patient, consider relation to early satiety and epigastric pain/constipation  Plan: 1.  Scheduled the patient for an EGD and colonoscopy with Dr. Henrene Pastor in the [facility].  Did discuss risks, benefits, limitations and alternatives and the patient agrees to proceed.  Patient will be Covid tested 2 days prior to time of procedures. 2.  Prescribed Linzess 145 mcg daily, 30 minutes before breakfast #30 with 3 refills.  Also provided a week of samples. 3.  Continue Omeprazole 40 mg daily for now 4.  Prescribed Reglan 10 mg #2, 1 before each bowel prep 5.  Patient to follow in clinic per recommendations from Dr. Henrene Pastor after time of procedure.  Today the pt reports that she took [place] for 8 days and stopped taking as it did  nothing. She gave herself one enema and it did nothing. She has taken fish oil with out relief of constipation. She denied n/v/d/GI bleed. She has her normal appetite. Clarification with pt that instead of no BM in 2 weeks that she has 1-2 BM a week consisting one 1-2 formed stools the size of a small pickle and of normal brown color. She feels that she has been constipated for years.   No cp/ct, other gu/gi sxs, other pain, sob, edema.   Past Medical History:  Diagnosis Date  . Anxiety disorder 09/09/2019  . Depression   . Diverticulitis   . Fatty liver   . GERD (gastroesophageal reflux disease)   . Hyperlipidemia   . Hypertension   .  Insomnia 09/09/2019  . Liver cyst   . Vitamin D deficiency     Past Surgical History:  Procedure Laterality Date  . ABDOMINAL HYSTERECTOMY    . COLON RESECTION     due to diverticulitis    Family History  Family history unknown: Yes    Social History   Socioeconomic History  . Marital status: Unknown    Spouse name: Not on file  . Number of children: 1  . Years of education: Not on file  . Highest education level: Not on file  Occupational History  . Occupation: retired  Tobacco Use  . Smoking status: Current Every Day Smoker    Packs/day: 0.50    Types: Cigarettes  . Smokeless tobacco: Never Used  Substance and Sexual Activity  . Alcohol use: Yes    Comment: occasional beer or bloody mary once a week  . Drug use: Never  . Sexual activity: Not on file  Other Topics Concern  . Not on file  Social History Narrative  . Not on file   Social Determinants of Health   Financial Resource Strain:   . Difficulty of Paying Living Expenses:   Food Insecurity:   . Worried About Charity fundraiser in the Last Year:   . Arboriculturist in the Last Year:   Transportation Needs:   . Film/video editor (Medical):   Marland Kitchen Lack of Transportation (Non-Medical):   Physical Activity:   . Days of Exercise per Week:   . Minutes of Exercise per Session:   Stress:   . Feeling of Stress :   Social Connections:   . Frequency of Communication with Friends and Family:   . Frequency of Social Gatherings with Friends and Family:   . Attends Religious Services:   . Active Member of Clubs or Organizations:   . Attends Archivist Meetings:   Marland Kitchen Marital Status:   Intimate Partner Violence:   . Fear of Current or Ex-Partner:   . Emotionally Abused:   Marland Kitchen Physically Abused:   . Sexually Abused:     Outpatient Medications Prior to Visit  Medication Sig Dispense Refill  . atorvastatin (LIPITOR) 20 MG tablet Take 1 tablet (20 mg total) by mouth daily. 90 tablet 1  .  chlorthalidone (HYGROTON) 25 MG tablet Take 1 tablet (25 mg total) by mouth daily. 90 tablet 1  . hydrALAZINE (APRESOLINE) 10 MG tablet Only take in the afternoon or evening if you have a blood pressure spike and you have already taken your other 2 blood pressure pills that day. 30 tablet 6  . metoCLOPramide (REGLAN) 10 MG tablet Take one tablet 30 minutes before each prep. 2 tablet 0  . montelukast (SINGULAIR) 10 MG tablet Take 1  tablet (10 mg total) by mouth at bedtime. 90 tablet 1  . omeprazole (PRILOSEC) 40 MG capsule Take 1 capsule (40 mg total) by mouth daily. 90 capsule 1  . QUEtiapine (SEROQUEL) 25 MG tablet Take 25 mg by mouth 2 (two) times daily.     . Vitamin D, Ergocalciferol, (DRISDOL) 1.25 MG (50000 UT) CAPS capsule Take 50,000 Units by mouth every 7 (seven) days.     Marland Kitchen zolpidem (AMBIEN) 5 MG tablet Take 10 mg by mouth at bedtime.     . varenicline (CHANTIX) 0.5 MG tablet Take 1 tablet (0.5 mg total) by mouth 2 (two) times daily. 60 tablet 0  . lisinopril (ZESTRIL) 10 MG tablet Take 1 tablet (10 mg total) by mouth daily. (Patient not taking: Reported on 11/06/2019) 30 tablet 6  . solifenacin (VESICARE) 5 MG tablet Take 5 mg by mouth daily.      No facility-administered medications prior to visit.    No Known Allergies  ROS Review of Systems  All other systems reviewed and are negative.     Objective:    Physical Exam  Constitutional: She is oriented to person, place, and time. Vital signs are normal. She appears well-developed and well-nourished.  HENT:  Head: Normocephalic.  Right Ear: Ear canal normal.  Left Ear: Ear canal normal.  Eyes: Pupils are equal, round, and reactive to light. Conjunctivae, EOM and lids are normal. Lids are everted and swept, no foreign bodies found.  Neck: No JVD present.  Cardiovascular: Normal rate and regular rhythm.  Pulmonary/Chest: Effort normal. She has no decreased breath sounds.  Abdominal: Soft. Normal appearance and bowel sounds  are normal. She exhibits no shifting dullness, no distension, no pulsatile liver, no fluid wave, no abdominal bruit, no ascites and no mass. There is no hepatosplenomegaly. There is no abdominal tenderness. There is no rigidity, no rebound, no guarding, no CVA tenderness, no tenderness at McBurney's point and negative Murphy's sign. No hernia.  Genitourinary:    Genitourinary Comments: Chaperone present: rectal WNL, no fissures or hemorrhoids. No rectal fecal impactions present. Normal soft brown stool minimal amount present in rectum.   Pt had BM after examination of 1 small formed normal color stool.    Neurological: She is alert and oriented to person, place, and time.  Skin: Skin is warm, dry and intact.  Psychiatric: She has a normal mood and affect. Her speech is normal and behavior is normal. Judgment and thought content normal. Cognition and memory are normal.    BP 110/64 (BP Location: Left Arm, Patient Position: Sitting, Cuff Size: Normal)   Pulse (!) 104   Temp 97.6 F (36.4 C) (Temporal)   Resp 18   Wt 153 lb 12.8 oz (69.8 kg)   SpO2 97%   BMI 28.36 kg/m  Wt Readings from Last 3 Encounters:  11/06/19 153 lb 12.8 oz (69.8 kg)  10/14/19 155 lb 8 oz (70.5 kg)  09/09/19 157 lb 6.4 oz (71.4 kg)     Health Maintenance Due  Topic Date Due  . COVID-19 Vaccine (1) Never done  . TETANUS/TDAP  Never done  . PAP SMEAR-Modifier  Never done  . PNA vac Low Risk Adult (1 of 2 - PCV13) Never done    There are no preventive care reminders to display for this patient.  Lab Results  Component Value Date   TSH 2.540 06/17/2019   Lab Results  Component Value Date   WBC 9.4 08/19/2019   HGB 14.1 08/19/2019  HCT 42.2 08/19/2019   MCV 91.5 08/19/2019   PLT 282 08/19/2019   Lab Results  Component Value Date   NA 139 08/19/2019   K 4.4 08/19/2019   CO2 21 08/19/2019   GLUCOSE 88 08/19/2019   BUN 15 08/19/2019   CREATININE 0.96 08/19/2019   BILITOT 0.3 08/19/2019   ALKPHOS  92 06/17/2019   AST 15 08/19/2019   ALT 10 08/19/2019   PROT 7.3 08/19/2019   ALBUMIN 4.8 06/17/2019   CALCIUM 10.0 08/19/2019   Lab Results  Component Value Date   CHOL 195 08/19/2019   Lab Results  Component Value Date   HDL 49 (L) 08/19/2019   Lab Results  Component Value Date   LDLCALC 114 (H) 08/19/2019   Lab Results  Component Value Date   TRIG 197 (H) 08/19/2019   Lab Results  Component Value Date   CHOLHDL 4.0 08/19/2019   No results found for: HGBA1C    Assessment & Plan:  Chronic constipation:No impaction found with digital rectal examination, complete soap suds enema in clinic with production of normal colored, size formed stool x 1.  Continue treatment recommended of Linzess from GI with diagnostic appointments.  You did not desire Xray today therefore the directions are Magnesium Citrate one bottle this evening. May repeat times one.  If no BM please complete Xray of abdomen.  Follow Up tomorrow.  Diet rich in fiber.   Problem List Items Addressed This Visit    None    Visit Diagnoses    Constipation, unspecified constipation type    -  Primary   Relevant Orders   DG Abd 2 Views      Follow-up: Return in about 1 day (around 11/07/2019).    [name], FNP

## 2019-11-12 ENCOUNTER — Ambulatory Visit: Payer: Medicare (Managed Care) | Admitting: Nurse Practitioner

## 2019-11-12 ENCOUNTER — Telehealth: Payer: Self-pay | Admitting: Nurse Practitioner

## 2019-11-12 ENCOUNTER — Encounter: Payer: Self-pay | Admitting: Nurse Practitioner

## 2019-11-12 ENCOUNTER — Ambulatory Visit (HOSPITAL_COMMUNITY)
Admission: RE | Admit: 2019-11-12 | Discharge: 2019-11-12 | Disposition: A | Discharge status: Home / Self Care | Payer: Medicare (Managed Care) | Source: Ambulatory Visit | Attending: Nurse Practitioner | Admitting: Nurse Practitioner

## 2019-11-12 ENCOUNTER — Other Ambulatory Visit: Payer: Self-pay

## 2019-11-12 VITALS — BP 122/80 | HR 107 | Temp 97.6°F | Resp 16 | Wt 152.2 lb

## 2019-11-12 DIAGNOSIS — L659 Nonscarring hair loss, unspecified: Secondary | ICD-10-CM

## 2019-11-12 DIAGNOSIS — I10 Essential (primary) hypertension: Secondary | ICD-10-CM

## 2019-11-12 DIAGNOSIS — K5900 Constipation, unspecified: Secondary | ICD-10-CM | POA: Insufficient documentation

## 2019-11-12 MED ORDER — LOSARTAN POTASSIUM 50 MG PO TABS: 50.0000 mg | ORAL_TABLET | Freq: Every day | ORAL | 3 refills | Status: DC

## 2019-11-12 NOTE — Telephone Encounter (Signed)
CB# [phone] Xray results

## 2019-11-12 NOTE — Patient Instructions (Signed)
Keep appointment with GI for diagnostic testing 11/20/2019.  Start new Blood pressure medication as directed stop taking all others as discussed, take blood pressure 3 hours after taking the new medication and call in blood pressure readings in 2 weeks and /or if >140/90 or <95/55. Eat low fat, lean diet, high fiber. Follow up for hair thinning.  Will plan on lab recheck in 3 months as planned will recheck your thyroid panel for changes then and consider treatment if all negative as per treatment desired plan after our discussion today.

## 2019-11-12 NOTE — Progress Notes (Signed)
Established Patient Office Visit  Subjective:  Patient ID: Tracey Saunders, female    DOB: Jan 01, 1956  Age: 64 y.o. MRN: [id number]  CC:  Chief Complaint  Patient presents with  . Alopecia  . Abdominal Pain    HPI Tracey Saunders is a 64 year old female presenting for concerns that her hair is thinning since she has become constipated. She has procedure scheduled with GI on 11/20/2019. She reported she did have good amount of BM after last visit last week but then became constipated again. She reports last night she drank 3 bottles of magnesium citrate and two enemas and drank soy sauce last night and getting out nothing but water. She denied n/v/d.   Pt reports that she stopped taking her blood pressure medication as prescribed a while back she is not sure how long ago. She take hydralazine as needed and lisinopril as needed, she stated when she feels she needs it, she stopped taking Chlorthalidone r/t reading that it causes constipation.   Time spent discussing adherence to medication and treatment plan. Time spent discussing HTN, sodium, recent elevated triglycerides. The pt reports she eats nothing but protein and vegetables. Discussed Lean proteins and educated on triglycerides and foods.  Past Medical History:  Diagnosis Date  . Anxiety disorder 09/09/2019  . Depression   . Diverticulitis   . Fatty liver   . GERD (gastroesophageal reflux disease)   . Hyperlipidemia   . Hypertension   . Insomnia 09/09/2019  . Liver cyst   . Vitamin D deficiency     Past Surgical History:  Procedure Laterality Date  . ABDOMINAL HYSTERECTOMY    . COLON RESECTION     due to diverticulitis    Family History  Family history unknown: Yes    Social History   Socioeconomic History  . Marital status: Unknown    Spouse name: Not on file  . Number of children: 1  . Years of education: Not on file  . Highest education level: Not on file  Occupational History  . Occupation: retired  Tobacco Use   . Smoking status: Current Every Day Smoker    Packs/day: 0.50    Types: Cigarettes  . Smokeless tobacco: Never Used  Substance and Sexual Activity  . Alcohol use: Yes    Comment: occasional beer or bloody mary once a week  . Drug use: Never  . Sexual activity: Not on file  Other Topics Concern  . Not on file  Social History Narrative  . Not on file   Social Determinants of Health   Financial Resource Strain:   . Difficulty of Paying Living Expenses:   Food Insecurity:   . Worried About Charity fundraiser in the Last Year:   . Arboriculturist in the Last Year:   Transportation Needs:   . Film/video editor (Medical):   Marland Kitchen Lack of Transportation (Non-Medical):   Physical Activity:   . Days of Exercise per Week:   . Minutes of Exercise per Session:   Stress:   . Feeling of Stress :   Social Connections:   . Frequency of Communication with Friends and Family:   . Frequency of Social Gatherings with Friends and Family:   . Attends Religious Services:   . Active Member of Clubs or Organizations:   . Attends Archivist Meetings:   Marland Kitchen Marital Status:   Intimate Partner Violence:   . Fear of Current or Ex-Partner:   . Emotionally Abused:   .  Physically Abused:   . Sexually Abused:     Outpatient Medications Prior to Visit  Medication Sig Dispense Refill  . atorvastatin (LIPITOR) 20 MG tablet Take 1 tablet (20 mg total) by mouth daily. 90 tablet 1  . montelukast (SINGULAIR) 10 MG tablet Take 1 tablet (10 mg total) by mouth at bedtime. 90 tablet 1  . omeprazole (PRILOSEC) 40 MG capsule Take 1 capsule (40 mg total) by mouth daily. 90 capsule 1  . QUEtiapine (SEROQUEL) 25 MG tablet Take 25 mg by mouth 2 (two) times daily.     . Vitamin D, Ergocalciferol, (DRISDOL) 1.25 MG (50000 UT) CAPS capsule Take 50,000 Units by mouth every 7 (seven) days.     Marland Kitchen zolpidem (AMBIEN) 5 MG tablet Take 10 mg by mouth at bedtime.     . chlorthalidone (HYGROTON) 25 MG tablet Take 1  tablet (25 mg total) by mouth daily. 90 tablet 1  . metoCLOPramide (REGLAN) 10 MG tablet Take one tablet 30 minutes before each prep. (Patient not taking: Reported on 11/12/2019) 2 tablet 0  . hydrALAZINE (APRESOLINE) 10 MG tablet Only take in the afternoon or evening if you have a blood pressure spike and you have already taken your other 2 blood pressure pills that day. (Patient not taking: Reported on 11/12/2019) 30 tablet 6  . lisinopril (ZESTRIL) 10 MG tablet Take 1 tablet (10 mg total) by mouth daily. (Patient not taking: Reported on 11/12/2019) 30 tablet 6   No facility-administered medications prior to visit.    No Known Allergies  ROS Review of Systems  All other systems reviewed and are negative.     Objective:    Physical Exam  Constitutional: She is oriented to person, place, and time. Vital signs are normal. She appears well-developed and well-nourished.  HENT:  Head: Normocephalic.  Right Ear: Hearing and ear canal normal.  Left Ear: Hearing and ear canal normal.  Eyes: Pupils are equal, round, and reactive to light. Conjunctivae, EOM and lids are normal. Lids are everted and swept, no foreign bodies found.  Neck: No JVD present.  Cardiovascular: Normal rate.  Pulmonary/Chest: Effort normal.  Abdominal: Soft. Bowel sounds are normal. She exhibits no distension and no mass. There is no abdominal tenderness. There is no rebound and no guarding.  Musculoskeletal:        General: No edema. Normal range of motion.     Cervical back: Normal range of motion and neck supple.  Neurological: She is alert and oriented to person, place, and time.  Skin: Skin is warm and dry.  Psychiatric: She has a normal mood and affect. Her speech is normal and behavior is normal. Judgment and thought content normal. Cognition and memory are normal.  Nursing note and vitals reviewed.   BP 122/80   Pulse (!) 107   Temp 97.6 F (36.4 C) (Temporal)   Resp 16   Wt 152 lb 4 oz (69.1 kg)   SpO2  98%   BMI 28.07 kg/m  Wt Readings from Last 3 Encounters:  11/12/19 152 lb 4 oz (69.1 kg)  11/06/19 153 lb 12.8 oz (69.8 kg)  10/14/19 155 lb 8 oz (70.5 kg)     Health Maintenance Due  Topic Date Due  . COVID-19 Vaccine (1) Never done  . TETANUS/TDAP  Never done  . PAP SMEAR-Modifier  Never done  . PNA vac Low Risk Adult (1 of 2 - PCV13) Never done    There are no preventive care reminders to display  for this patient.  Lab Results  Component Value Date   TSH 2.540 06/17/2019   Lab Results  Component Value Date   WBC 9.4 08/19/2019   HGB 14.1 08/19/2019   HCT 42.2 08/19/2019   MCV 91.5 08/19/2019   PLT 282 08/19/2019   Lab Results  Component Value Date   NA 139 08/19/2019   K 4.4 08/19/2019   CO2 21 08/19/2019   GLUCOSE 88 08/19/2019   BUN 15 08/19/2019   CREATININE 0.96 08/19/2019   BILITOT 0.3 08/19/2019   ALKPHOS 92 06/17/2019   AST 15 08/19/2019   ALT 10 08/19/2019   PROT 7.3 08/19/2019   ALBUMIN 4.8 06/17/2019   CALCIUM 10.0 08/19/2019   Lab Results  Component Value Date   CHOL 195 08/19/2019   Lab Results  Component Value Date   HDL 49 (L) 08/19/2019   Lab Results  Component Value Date   LDLCALC 114 (H) 08/19/2019   Lab Results  Component Value Date   TRIG 197 (H) 08/19/2019   Lab Results  Component Value Date   CHOLHDL 4.0 08/19/2019   No results found for: HGBA1C    Assessment & Plan:   Problem List Items Addressed This Visit      Cardiovascular and Mediastinum   Essential hypertension - Primary   Relevant Medications   losartan (COZAAR) 50 MG tablet     Other   Hair thinning    Keep appointment with GI for diagnostic testing 11/20/2019.  Start new Blood pressure medication as directed stop taking all others as discussed, take blood pressure 3 hours after taking the new medication and call in blood pressure readings in 2 weeks and /or if >140/90 or <95/55. Eat low fat, lean diet, high fiber. Follow up for hair thinning.   Will plan on lab recheck in 3 months as planned will recheck your thyroid panel for changes then and consider treatment if all negative as per treatment desired plan after our discussion today.  Meds ordered this encounter  Medications  . losartan (COZAAR) 50 MG tablet    Sig: Take 1 tablet (50 mg total) by mouth daily.    Dispense:  90 tablet    Refill:  3    Follow-up: Return if symptoms worsen or fail to improve.    [name], FNP

## 2019-11-12 NOTE — Progress Notes (Signed)
Let pt knowIMPRESSION: Nonobstructive pattern of bowel gas with gas present to the rectum. No significant burden of stool in the colon.

## 2019-11-18 ENCOUNTER — Other Ambulatory Visit: Payer: Self-pay | Admitting: Internal Medicine

## 2019-11-18 ENCOUNTER — Ambulatory Visit (INDEPENDENT_AMBULATORY_CARE_PROVIDER_SITE_OTHER): Payer: Medicare (Managed Care)

## 2019-11-18 DIAGNOSIS — Z1159 Encounter for screening for other viral diseases: Secondary | ICD-10-CM

## 2019-11-18 LAB — SARS CORONAVIRUS 2 (TAT 6-24 HRS): SARS Coronavirus 2: NEGATIVE

## 2019-11-20 ENCOUNTER — Other Ambulatory Visit: Payer: Self-pay

## 2019-11-20 ENCOUNTER — Ambulatory Visit (AMBULATORY_SURGERY_CENTER): Payer: Medicare (Managed Care) | Admitting: Internal Medicine

## 2019-11-20 ENCOUNTER — Encounter: Payer: Self-pay | Admitting: Internal Medicine

## 2019-11-20 VITALS — BP 156/83 | HR 96 | Temp 95.8°F | Resp 16 | Ht 61.0 in | Wt 147.0 lb

## 2019-11-20 DIAGNOSIS — R1084 Generalized abdominal pain: Secondary | ICD-10-CM

## 2019-11-20 DIAGNOSIS — R140 Abdominal distension (gaseous): Secondary | ICD-10-CM | POA: Diagnosis not present

## 2019-11-20 DIAGNOSIS — R110 Nausea: Secondary | ICD-10-CM | POA: Diagnosis not present

## 2019-11-20 DIAGNOSIS — R634 Abnormal weight loss: Secondary | ICD-10-CM

## 2019-11-20 DIAGNOSIS — R194 Change in bowel habit: Secondary | ICD-10-CM | POA: Diagnosis not present

## 2019-11-20 DIAGNOSIS — K635 Polyp of colon: Secondary | ICD-10-CM

## 2019-11-20 DIAGNOSIS — R1013 Epigastric pain: Secondary | ICD-10-CM

## 2019-11-20 DIAGNOSIS — D122 Benign neoplasm of ascending colon: Secondary | ICD-10-CM

## 2019-11-20 DIAGNOSIS — R112 Nausea with vomiting, unspecified: Secondary | ICD-10-CM

## 2019-11-20 DIAGNOSIS — K5900 Constipation, unspecified: Secondary | ICD-10-CM

## 2019-11-20 MED ORDER — SODIUM CHLORIDE 0.9 % IV SOLN: 500.0000 mL | INTRAVENOUS | Status: DC

## 2019-11-20 NOTE — Op Note (Addendum)
[name] Procedure Date: 11/20/2019 1:34 PM MRN: [id number] Endoscopist: Docia Chuck. Henrene Pastor , MD Age: 64 Referring MD:  Date of Birth: 1956-04-06 Gender: Female Account #: [id number] Procedure:                Colonoscopy with cold snare polypectomy x 1 Indications:              Abdominal pain, Change in bowel habits, Weight                            loss. Reports a history of remote partial colectomy                            for diverticular disease while living in [place]:                Monitored Anesthesia Care Procedure:                Pre-Anesthesia Assessment:                           - Prior to the procedure, a History and Physical                            was performed, and patient medications and                            allergies were reviewed. The patient's tolerance of                            previous anesthesia was also reviewed. The risks                            and benefits of the procedure and the sedation                            options and risks were discussed with the patient.                            All questions were answered, and informed consent                            was obtained. Prior Anticoagulants: The patient has                            taken no previous anticoagulant or antiplatelet                            agents. ASA Grade Assessment: II - A patient with                            mild systemic disease. After reviewing the risks                            and benefits, the patient was deemed in  satisfactory condition to undergo the procedure.                           After obtaining informed consent, the colonoscope                            was passed under direct vision. Throughout the                            procedure, the patient's blood pressure, pulse, and                            oxygen saturations were monitored continuously. The                     Colonoscope was introduced through the anus and                            advanced to the the cecum, identified by                            appendiceal orifice and ileocecal valve. The                            terminal ileum, ileocecal valve, appendiceal                            orifice, and rectum were photographed. The quality                            of the bowel preparation was excellent. The                            colonoscopy was performed without difficulty. The                            patient tolerated the procedure well. The bowel                            preparation used was SUPREP via split dose                            instruction. Scope In: 1:37:28 PM Scope Out: 1:48:12 PM Scope Withdrawal Time: 0 hours 8 minutes 28 seconds  Total Procedure Duration: 0 hours 10 minutes 44 seconds  Findings:                 The terminal ileum appeared normal.                           A 1 mm polyp was found in the ascending colon. The                            polyp was removed with a cold snare. Resection and  retrieval were complete.                           There was a side to side anastomosis in the sigmoid                            colon region at approximately 20 cm from the anal                            verge. The exam was otherwise without abnormality                            on direct and retroflexion views. Complications:            No immediate complications. Estimated blood loss:                            None. Estimated Blood Loss:     Estimated blood loss: none. Impression:               - The examined portion of the ileum was normal.                           - One 1 mm polyp in the ascending colon, removed                            with a cold snare. Resected and retrieved.                           - Prior segmental colectomy in the sigmoid colon                            region                            - The examination was otherwise normal on direct                            and retroflexion views. Recommendation:           - Repeat colonoscopy in 7-10 years for surveillance.                           - Patient has a contact number available for                            emergencies. The signs and symptoms of potential                            delayed complications were discussed with the                            patient. Return to normal activities tomorrow.                            Written  discharge instructions were provided to the                            patient.                           - Resume previous diet.                           - Continue present medications.                           - Await pathology results.                           - EGD today. Please see report regarding findings                            and final recommendations [name], MD 11/20/2019 2:00:05 PM This report has been signed electronically.

## 2019-11-20 NOTE — Progress Notes (Signed)
Called to room to assist during endoscopic procedure.  Patient ID and intended procedure confirmed with present staff. Received instructions for my participation in the procedure from the performing physician.  

## 2019-11-20 NOTE — Patient Instructions (Signed)
Please read handouts provided. Continue present medications. Await pathology results.     YOU HAD AN ENDOSCOPIC PROCEDURE TODAY AT THE [facility]:   Refer to the procedure report that was given to you for any specific questions about what was found during the examination.  If the procedure report does not answer your questions, please call your gastroenterologist to clarify.  If you requested that your care partner not be given the details of your procedure findings, then the procedure report has been included in a sealed envelope for you to review at your convenience later.  YOU SHOULD EXPECT: Some feelings of bloating in the abdomen. Passage of more gas than usual.  Walking can help get rid of the air that was put into your GI tract during the procedure and reduce the bloating. If you had a lower endoscopy (such as a colonoscopy or flexible sigmoidoscopy) you may notice spotting of blood in your stool or on the toilet paper. If you underwent a bowel prep for your procedure, you may not have a normal bowel movement for a few days.  Please Note:  You might notice some irritation and congestion in your nose or some drainage.  This is from the oxygen used during your procedure.  There is no need for concern and it should clear up in a day or so.  SYMPTOMS TO REPORT IMMEDIATELY:   Following lower endoscopy (colonoscopy or flexible sigmoidoscopy):  Excessive amounts of blood in the stool  Significant tenderness or worsening of abdominal pains  Swelling of the abdomen that is new, acute  Fever of 100F or higher   Following upper endoscopy (EGD)  Vomiting of blood or coffee ground material  New chest pain or pain under the shoulder blades  Painful or persistently difficult swallowing  New shortness of breath  Fever of 100F or higher  Black, tarry-looking stools  For urgent or emergent issues, a gastroenterologist can be reached at any hour by calling [phone]. Do not  use MyChart messaging for urgent concerns.    DIET:  We do recommend a small meal at first, but then you may proceed to your regular diet.  Drink plenty of fluids but you should avoid alcoholic beverages for 24 hours.  ACTIVITY:  You should plan to take it easy for the rest of today and you should NOT DRIVE or use heavy machinery until tomorrow (because of the sedation medicines used during the test).    FOLLOW UP: Our staff will call the number listed on your records 48-72 hours following your procedure to check on you and address any questions or concerns that you may have regarding the information given to you following your procedure. If we do not reach you, we will leave a message.  We will attempt to reach you two times.  During this call, we will ask if you have developed any symptoms of COVID 19. If you develop any symptoms (ie: fever, flu-like symptoms, shortness of breath, cough etc.) before then, please call [phone].  If you test positive for Covid 19 in the 2 weeks post procedure, please call and report this information to us.    If any biopsies were taken you will be contacted by phone or by letter within the next 1-3 weeks.  Please call us at [phone] if you have not heard about the biopsies in 3 weeks.    SIGNATURES/CONFIDENTIALITY: You and/or your care partner have signed paperwork which will be entered into your electronic medical   record.  These signatures attest to the fact that that the information above on your After Visit Summary has been reviewed and is understood.  Full responsibility of the confidentiality of this discharge information lies with you and/or your care-partner. 

## 2019-11-20 NOTE — Telephone Encounter (Signed)
Requested Prescriptions   Pending Prescriptions Disp Refills  . zolpidem (AMBIEN) 5 MG tablet 30 tablet 0    Sig: Take 2 tablets (10 mg total) by mouth at bedtime.    Pt states that she only takes Rx PRN. She wants them in case she has a sleepless night. Please advise.

## 2019-11-20 NOTE — Op Note (Addendum)
[facility] Patient Name: Tracey Saunders Procedure Date: 11/20/2019 1:33 PM MRN: [id number] Endoscopist: Docia Chuck. Henrene Pastor , MD Age: 64 Referring MD:  Date of Birth: [birth year] Gender: Female Account #: [id number] Procedure:                Upper GI endoscopy Indications:              Abdominal pain, Abdominal bloating, Nausea, Weight                            loss. NOTE: The patient tells me that she had CT                            imaging at [facility]. We do not have that                            report Medicines:                Monitored Anesthesia Care Procedure:                Pre-Anesthesia Assessment:                           - Prior to the procedure, a History and Physical                            was performed, and patient medications and                            allergies were reviewed. The patient's tolerance of                            previous anesthesia was also reviewed. The risks                            and benefits of the procedure and the sedation                            options and risks were discussed with the patient.                            All questions were answered, and informed consent                            was obtained. Prior Anticoagulants: The patient has                            taken no previous anticoagulant or antiplatelet                            agents. ASA Grade Assessment: II - A patient with                            mild systemic disease. After reviewing the risks  and benefits, the patient was deemed in                            satisfactory condition to undergo the procedure.                           After obtaining informed consent, the endoscope was                            passed under direct vision. Throughout the                            procedure, the patient's blood pressure, pulse, and                            oxygen saturations were monitored continuously. The                             Endoscope was introduced through the mouth, and                            advanced to the second part of duodenum. The upper                            GI endoscopy was accomplished without difficulty.                            The patient tolerated the procedure well. Scope In: Scope Out: Findings:                 The esophagus was normal.                           The stomach was normal.                           The examined duodenum was normal.                           The cardia and gastric fundus were normal on                            retroflexion. Complications:            No immediate complications. Estimated Blood Loss:     Estimated blood loss: none. Impression:               1. Normal EGD                           2. No significant primary gastrointestinal problems                            identified. Recommendation:           1. Return to the care of your primary provider  2. Confirm that you have had advanced imaging, such                            as CT scan, of the abdomen. If not, would recommend                            such                           3. GI follow-up as needed. Docia Chuck. Henrene Pastor, MD 11/20/2019 2:05:43 PM This report has been signed electronically.

## 2019-11-20 NOTE — Progress Notes (Signed)
Pt constantly coughing in PACU.  Very thick mucus being produced orally and nasally.  Pt sat up and encouraged to blow her nose and spit out mucus.  Sats in high 90s but pt is tachy.  Tried an  albutrol treatment.  Pt kept coughing up clear thick mucus during.  BS hard to hear from constant coughing.  15 mg of esmolol given to get HR trending down.--Josh Monday CRNA

## 2019-11-22 ENCOUNTER — Telehealth: Payer: Self-pay

## 2019-11-22 ENCOUNTER — Telehealth: Payer: Self-pay | Admitting: *Deleted

## 2019-11-22 ENCOUNTER — Other Ambulatory Visit: Payer: Self-pay

## 2019-11-22 DIAGNOSIS — G8929 Other chronic pain: Secondary | ICD-10-CM

## 2019-11-22 DIAGNOSIS — R6881 Early satiety: Secondary | ICD-10-CM

## 2019-11-22 DIAGNOSIS — R634 Abnormal weight loss: Secondary | ICD-10-CM

## 2019-11-22 NOTE — Telephone Encounter (Signed)
  Follow up Call-  Call back number 11/20/2019  Post procedure Call Back phone  # [phone]  Permission to leave phone message Yes     Patient questions:  Do you have a fever, pain , or abdominal swelling? No. Pain Score  0 *  Have you tolerated food without any problems? Yes.    Have you been able to return to your normal activities? Yes.    Do you have any questions about your discharge instructions: Diet   No. Medications  No. Follow up visit  No.  Do you have questions or concerns about your Care? No.  Actions: * If pain score is 4 or above: No action needed, pain <4.  1. Have you developed a fever since your procedure? no  2.   Have you had an respiratory symptoms (SOB or cough) since your procedure? no  3.   Have you tested positive for COVID 19 since your procedure no  4.   Have you had any family members/close contacts diagnosed with the COVID 19 since your procedure?  no   If yes to any of these questions please route to Joylene John, RN and Erenest Rasher, RN

## 2019-11-22 NOTE — Telephone Encounter (Signed)
Spoke with pt and she is aware.

## 2019-11-22 NOTE — Telephone Encounter (Signed)
First post procedure follow up call, no answer 

## 2019-11-22 NOTE — Telephone Encounter (Signed)
Pt scheduled for CT of A/P at [facility] 11/27/19@1pm , pt to arrive there at 12:45pm. Pt to pick up contrast from [facility] on 1st floor before 5/19 and they will give her the instructions. Pt scheduled to see Dr. Henrene Pastor 01/02/20 at 10:40am. Left message for pt to call back.

## 2019-11-22 NOTE — Telephone Encounter (Signed)
-----   Message from Irene Shipper, MD sent at 11/20/2019  3:39 PM EDT ----- Vaughan Basta, good work.Please contact the patient tomorrow and let her know that we found an ultrasound which shows gallstones.Let her know that I want to get a contrast CT scan of the abdomen and pelvis to evaluate her abdominal pain, early satiety, and weight loss.Get her at office follow-up with me in 4 weeks (after the CT is complete).  Convert this to a phone note for my future reference.[id number] ----- Message ----- From: Algernon Huxley, RN Sent: 11/20/2019   3:18 PM EDT To: Irene Shipper, MD  [facility]ed [facility] and they are [facility] of [facility]. I also looked in care everywhere and could only fine [place] from 08/12/19. [facility] states they only have her for the [place] in Feb. CLINICAL DATA: Nausea, labile blood pressure  EXAM: ULTRASOUND ABDOMEN LIMITED RIGHT UPPER QUADRANT  COMPARISON: None.  FINDINGS: Gallbladder:  There is a nonshadowing 6 mm calculus within the gallbladder lumen. No evidence of acute cholecystitis. No gallbladder wall thickening or pericholecystic fluid.  Common bile duct:  Diameter: 0.3 cm  Liver:  Liver displays mild background increased liver echotexture consistent with fibrofatty infiltration. Benign 1.4 cm cyst right lobe liver. No intrahepatic biliary duct dilation. Portal vein is patent on color Doppler imaging with normal direction of blood flow towards the liver.  Other: None.  IMPRESSION: 1. Cholelithiasis without cholecystitis. 2. Mild fatty infiltration of the liver.   Electronically Signed  By: Randa Ngo M.D.  On: 08/12/2019 15:45 ----- Message ----- From: Irene Shipper, MD Sent: 11/20/2019   2:06 PM EDT To: Irene Shipper, MD, Algernon Huxley, RN  Vaughan Basta, this patient was seen by one of the PAs as a new patient, last month.  There was no mention of advanced imaging (such as CT scan) in their note.  Patient tells me that she thinks she had advanced imaging or some  type of scans at a place called [facility].  Check with [facility] and/or the patient's primary care doctor to find any and all radiology for my review, ASAP.  Hyman Bower

## 2019-11-25 ENCOUNTER — Other Ambulatory Visit: Payer: Self-pay | Admitting: Nurse Practitioner

## 2019-11-25 DIAGNOSIS — G4700 Insomnia, unspecified: Secondary | ICD-10-CM

## 2019-11-25 MED ORDER — ZOLPIDEM TARTRATE 5 MG PO TABS: 5.0000 mg | ORAL_TABLET | Freq: Every evening | ORAL | 1 refills | Status: DC | PRN

## 2019-11-26 ENCOUNTER — Encounter: Payer: Self-pay | Admitting: Internal Medicine

## 2019-11-27 ENCOUNTER — Telehealth: Payer: Self-pay | Admitting: Physician Assistant

## 2019-11-27 ENCOUNTER — Ambulatory Visit (HOSPITAL_COMMUNITY): Payer: Medicare (Managed Care)

## 2019-11-27 DIAGNOSIS — R6881 Early satiety: Secondary | ICD-10-CM

## 2019-11-27 DIAGNOSIS — R1013 Epigastric pain: Secondary | ICD-10-CM

## 2019-11-27 DIAGNOSIS — R634 Abnormal weight loss: Secondary | ICD-10-CM

## 2019-11-27 NOTE — Telephone Encounter (Signed)
Tedra Coupe from [facility] stated that pt has TC appt on 5/27 and requested orders for I-STAT creatinine placed for pt to have labs drawn the same day.

## 2019-11-27 NOTE — Telephone Encounter (Signed)
I stat entered

## 2019-11-28 ENCOUNTER — Telehealth: Payer: Self-pay

## 2019-11-28 NOTE — Telephone Encounter (Signed)
BP readings:  05/05    80/56 05/06    90/57 05/07    101/64 05/08    118/81 05/09    114/67 05/10    104/73 05/11    132/78 05/12    103/73 05/13    104/68 05/14    118/65 05/15    126/90 05/16    107/81

## 2019-12-01 ENCOUNTER — Encounter (HOSPITAL_COMMUNITY): Payer: Self-pay | Admitting: Emergency Medicine

## 2019-12-01 ENCOUNTER — Emergency Department (HOSPITAL_COMMUNITY): Payer: Medicare (Managed Care)

## 2019-12-01 ENCOUNTER — Emergency Department (HOSPITAL_COMMUNITY)
Admission: EM | Admit: 2019-12-01 | Discharge: 2019-12-01 | Disposition: A | Discharge status: Home / Self Care | Payer: Medicare (Managed Care) | Attending: Emergency Medicine | Admitting: Emergency Medicine

## 2019-12-01 ENCOUNTER — Other Ambulatory Visit: Payer: Self-pay

## 2019-12-01 DIAGNOSIS — Z79899 Other long term (current) drug therapy: Secondary | ICD-10-CM | POA: Insufficient documentation

## 2019-12-01 DIAGNOSIS — Z8541 Personal history of malignant neoplasm of cervix uteri: Secondary | ICD-10-CM | POA: Insufficient documentation

## 2019-12-01 DIAGNOSIS — I10 Essential (primary) hypertension: Secondary | ICD-10-CM | POA: Diagnosis not present

## 2019-12-01 DIAGNOSIS — F17210 Nicotine dependence, cigarettes, uncomplicated: Secondary | ICD-10-CM | POA: Insufficient documentation

## 2019-12-01 DIAGNOSIS — R634 Abnormal weight loss: Secondary | ICD-10-CM | POA: Diagnosis not present

## 2019-12-01 DIAGNOSIS — R1084 Generalized abdominal pain: Secondary | ICD-10-CM | POA: Insufficient documentation

## 2019-12-01 LAB — URINALYSIS, ROUTINE W REFLEX MICROSCOPIC
Bilirubin Urine: NEGATIVE
Glucose, UA: NEGATIVE mg/dL
Hgb urine dipstick: NEGATIVE
Ketones, ur: NEGATIVE mg/dL
Leukocytes,Ua: NEGATIVE
Nitrite: NEGATIVE
Protein, ur: NEGATIVE mg/dL
Specific Gravity, Urine: 1.011 (ref 1.005–1.030)
pH: 6 (ref 5.0–8.0)

## 2019-12-01 LAB — COMPREHENSIVE METABOLIC PANEL
ALT: 13 U/L (ref 0–44)
AST: 17 U/L (ref 15–41)
Albumin: 3.5 g/dL (ref 3.5–5.0)
Alkaline Phosphatase: 60 U/L (ref 38–126)
Anion gap: 13 (ref 5–15)
BUN: 8 mg/dL (ref 8–23)
CO2: 23 mmol/L (ref 22–32)
Calcium: 9.2 mg/dL (ref 8.9–10.3)
Chloride: 105 mmol/L (ref 98–111)
Creatinine, Ser: 0.8 mg/dL (ref 0.44–1.00)
GFR calc Af Amer: >60 mL/min (ref >60)
GFR calc non Af Amer: >60 mL/min (ref >60)
Glucose, Bld: 106 mg/dL — ABNORMAL HIGH (ref 70–99)
Potassium: 3.9 mmol/L (ref 3.5–5.1)
Sodium: 141 mmol/L (ref 135–145)
Total Bilirubin: 0.9 mg/dL (ref 0.3–1.2)
Total Protein: 6.3 g/dL — ABNORMAL LOW (ref 6.5–8.1)

## 2019-12-01 LAB — CBC
HCT: 38.4 % (ref 36.0–46.0)
Hemoglobin: 12.5 g/dL (ref 12.0–15.0)
MCH: 30.8 pg (ref 26.0–34.0)
MCHC: 32.6 g/dL (ref 30.0–36.0)
MCV: 94.6 fL (ref 80.0–100.0)
Platelets: 239 10*3/uL (ref 150–400)
RBC: 4.06 MIL/uL (ref 3.87–5.11)
RDW: 12.9 % (ref 11.5–15.5)
WBC: 7.4 10*3/uL (ref 4.0–10.5)
nRBC: 0 % (ref 0.0–0.2)

## 2019-12-01 LAB — LIPASE, BLOOD: Lipase: 23 U/L (ref 11–51)

## 2019-12-01 MED ORDER — DICYCLOMINE HCL 20 MG PO TABS
20.0000 mg | ORAL_TABLET | Freq: Two times a day (BID) | ORAL | 0 refills | Status: DC
Start: 2019-12-01 — End: 2020-01-02

## 2019-12-01 MED ORDER — SODIUM CHLORIDE 0.9 % IV BOLUS
1000.0000 mL | Freq: Once | INTRAVENOUS | Status: AC
  Administered 2019-12-01: 1000 mL via INTRAVENOUS

## 2019-12-01 MED ORDER — SODIUM CHLORIDE 0.9% FLUSH: 3.0000 mL | Freq: Once | INTRAVENOUS | Status: DC

## 2019-12-01 MED ORDER — IOHEXOL 300 MG/ML  SOLN
100.0000 mL | Freq: Once | INTRAMUSCULAR | Status: AC | PRN
  Administered 2019-12-01: 100 mL via INTRAVENOUS

## 2019-12-01 MED ORDER — MORPHINE SULFATE (PF) 4 MG/ML IV SOLN
4.0000 mg | Freq: Once | INTRAVENOUS | Status: AC
  Administered 2019-12-01: 4 mg via INTRAVENOUS
  Filled 2019-12-01: qty 1

## 2019-12-01 MED ORDER — ONDANSETRON HCL 4 MG/2ML IJ SOLN
4.0000 mg | Freq: Once | INTRAMUSCULAR | Status: AC
  Administered 2019-12-01: 4 mg via INTRAVENOUS
  Filled 2019-12-01: qty 2

## 2019-12-01 NOTE — ED Triage Notes (Signed)
C/o generalized abd pain/cramping x 2 months with loss of appetite.  Denies nausea, vomiting, and diarrhea.  Seen by PCP for same and referred to GI.  Has abd CT ordered for Thursday but states she was unable to wait due to pain.

## 2019-12-01 NOTE — ED Notes (Signed)
Patient c/o abd. Pain onset Dec. States she weighted 175 and now weights 140. States she is scheduled for CT abd. On thurs however she can't wait til thurs. C/o severe nausea , no vomiting or diarrhea. LBM this am of when is normal for her. States she feels very bloated. Decreased appetite. Pain isn't any different today states she just can't take it any  Longer. Her GI doctor Scarlette Shorts, and her PCP is [employer] PA

## 2019-12-01 NOTE — ED Provider Notes (Signed)
[facility]PART[facility] Provider Note   CSN: [id number] Arrival date & time: 12/01/19  1300     History Chief Complaint  Patient presents with  . Abdominal Pain    Tracey Saunders is a 64 y.o. female.  Pt presents to the ED today with abdominal pain.  Pt said she has had abd pain for months.  She has seen [facility] and had a nl egd and colonoscopy.  She was scheduled for a CT scan on Thursday, May 27.  However, she feels like she can't wait that long.  She said she's had over 20 lbs loss.  She has not had an appetite.  It hurts to eat.  No f/c.  No sob or cough.        Past Medical History:  Diagnosis Date  . Anxiety disorder 09/09/2019  . Cancer (HCC)    cervical cancer  . Depression   . Diverticulitis   . Fatty liver   . GERD (gastroesophageal reflux disease)   . Hyperlipidemia   . Hypertension   . Insomnia 09/09/2019  . Liver cyst   . Vitamin D deficiency     Patient Active Problem List   Diagnosis Date Noted  . Hair thinning 11/12/2019  . Recurrent major depressive disorder, in full remission ([place]) 11/04/2019  . Gastroesophageal reflux disease 09/09/2019  . Insomnia 09/09/2019  . Anxiety disorder 09/09/2019  . Essential hypertension 06/03/2019  . Hyperlipidemia 06/03/2019  . History of cancer 04/29/2019  . Vitamin B12 deficiency 04/29/2019  . Vitamin D deficiency 04/29/2019    Past Surgical History:  Procedure Laterality Date  . ABDOMINAL HYSTERECTOMY    . COLON RESECTION     due to diverticulitis     OB History   No obstetric history on file.     Family History  Problem Relation Age of Onset  . Colon cancer Neg Hx   . Esophageal cancer Neg Hx   . Stomach cancer Neg Hx   . Rectal cancer Neg Hx     Social History   Tobacco Use  . Smoking status: Current Every Day Smoker    Packs/day: 0.50    Types: Cigarettes  . Smokeless tobacco: Never Used  Substance Use Topics  . Alcohol use: Yes    Comment: occasional beer  or bloody mary once a week  . Drug use: Never    Home Medications Prior to Admission medications   Medication Sig Start Date End Date Taking? Authorizing Provider  atorvastatin (LIPITOR) 20 MG tablet Take 1 tablet (20 mg total) by mouth daily. 10/15/19 11/20/19  [name], FNP  dicyclomine (BENTYL) 20 MG tablet Take 1 tablet (20 mg total) by mouth 2 (two) times daily. 12/01/19   Isla Pence, MD  losartan (COZAAR) 50 MG tablet Take 1 tablet (50 mg total) by mouth daily. 11/12/19   [name], FNP  metoCLOPramide (REGLAN) 10 MG tablet Take one tablet 30 minutes before each prep. Patient not taking: Reported on 11/20/2019 10/14/19   Levin Erp, PA  montelukast (SINGULAIR) 10 MG tablet Take 1 tablet (10 mg total) by mouth at bedtime. 10/15/19   [name], FNP  omeprazole (PRILOSEC) 40 MG capsule Take 1 capsule (40 mg total) by mouth daily. 10/15/19   [name], FNP  QUEtiapine (SEROQUEL) 25 MG tablet Take 25 mg by mouth 2 (two) times daily.  04/22/19   [provider]  Vitamin D, Ergocalciferol, (DRISDOL) 1.25 MG (50000 UT) CAPS  capsule Take 50,000 Units by mouth every 7 (seven) days.  04/15/19   [provider]  zolpidem (AMBIEN) 5 MG tablet Take 1 tablet (5 mg total) by mouth at bedtime as needed for sleep. 11/25/19   [name], FNP    Allergies    Patient has no known allergies.  Review of Systems   Review of Systems  Gastrointestinal: Positive for abdominal pain.  All other systems reviewed and are negative.   Physical Exam Updated Vital Signs BP (!) 160/79   Pulse 68   Temp 97.7 F (36.5 C) (Oral)   Resp 16   Ht 5\' 1"  (1.549 m)   Wt 67 kg   SpO2 95%   BMI 27.91 kg/m   Physical Exam Vitals and nursing note reviewed.  Constitutional:      Appearance: She is well-developed.  HENT:     Head: Normocephalic and atraumatic.     Mouth/Throat:     Mouth: Mucous membranes are moist.     Pharynx: Oropharynx is clear.  Eyes:      Extraocular Movements: Extraocular movements intact.     Pupils: Pupils are equal, round, and reactive to light.  Cardiovascular:     Rate and Rhythm: Normal rate and regular rhythm.  Pulmonary:     Effort: Pulmonary effort is normal.     Breath sounds: Normal breath sounds.  Abdominal:     General: Abdomen is flat.     Palpations: Abdomen is soft.     Tenderness: There is generalized abdominal tenderness.  Skin:    Capillary Refill: Capillary refill takes less than 2 seconds.  Neurological:     General: No focal deficit present.     Mental Status: She is alert and oriented to person, place, and time.  Psychiatric:        Mood and Affect: Mood normal.        Behavior: Behavior normal.     ED Results / Procedures / Treatments   Labs (all labs ordered are listed, but only abnormal results are displayed) Labs Reviewed  COMPREHENSIVE METABOLIC PANEL - Abnormal; Notable for the following components:      Result Value   Glucose, Bld 106 (*)    Total Protein 6.3 (*)    All other components within normal limits  LIPASE, BLOOD  CBC  URINALYSIS, ROUTINE W REFLEX MICROSCOPIC    EKG None  Radiology CT ABDOMEN PELVIS W CONTRAST  Result Date: 12/01/2019 CLINICAL DATA:  Abdominal pain, bloating for 6 months EXAM: CT ABDOMEN AND PELVIS WITH CONTRAST TECHNIQUE: Multidetector CT imaging of the abdomen and pelvis was performed using the standard protocol following bolus administration of intravenous contrast. CONTRAST:  165mL OMNIPAQUE IOHEXOL 300 MG/ML  SOLN COMPARISON:  None. FINDINGS: Lower chest: No acute abnormality. Hepatobiliary: No solid liver abnormality is seen. No gallstones, gallbladder wall thickening, or biliary dilatation. Pancreas: Unremarkable. No pancreatic ductal dilatation or surrounding inflammatory changes. Spleen: Normal in size without significant abnormality. Adrenals/Urinary Tract: Adrenal glands are unremarkable. Kidneys are normal, without renal calculi, solid  lesion, or hydronephrosis. Bladder is unremarkable. Stomach/Bowel: Stomach is within normal limits. Appendix appears normal. No evidence of bowel wall thickening, distention, or inflammatory changes. Postoperative findings of prior sigmoid colon resection and reanastomosis. Vascular/Lymphatic: Aortic atherosclerosis. No enlarged abdominal or pelvic lymph nodes. Reproductive: Status post hysterectomy. Other: No abdominal wall hernia or abnormality. No abdominopelvic ascites. Musculoskeletal: No acute or significant osseous findings. IMPRESSION: 1. No acute CT findings of the abdomen or pelvis  to explain abdominal pain or bloating. 2. Postoperative findings of prior sigmoid colon resection and reanastomosis. 3. Status post hysterectomy. 4. Aortic Atherosclerosis (ICD10-I70.0). Electronically Signed   By: Eddie Candle M.D.   On: 12/01/2019 17:19    Procedures Procedures (including critical care time)  Medications Ordered in ED Medications  sodium chloride flush (NS) 0.9 % injection 3 mL (3 mLs Intravenous Not Given 12/01/19 1520)  sodium chloride 0.9 % bolus 1,000 mL (1,000 mLs Intravenous New Bag/Given 12/01/19 1526)  ondansetron (ZOFRAN) injection 4 mg (4 mg Intravenous Given 12/01/19 1527)  morphine 4 MG/ML injection 4 mg (4 mg Intravenous Given 12/01/19 1527)  iohexol (OMNIPAQUE) 300 MG/ML solution 100 mL (100 mLs Intravenous Contrast Given 12/01/19 1713)    ED Course  I have reviewed the triage vital signs and the nursing notes.  Pertinent labs & imaging results that were available during my care of the patient were reviewed by me and considered in my medical decision making (see chart for details).    MDM Rules/Calculators/A&P                      Pt is very frustrated that no one can find out why she is hurting so much.  The only other thing I can think of is to order a HIDA scan.  I did order that as an outpatient.  I will also try some bentyl.  Pt is to f/u with her GI Dr and with her pcp.   Return if worse.   Final Clinical Impression(s) / ED Diagnoses Final diagnoses:  Generalized abdominal pain    Rx / DC Orders ED Discharge Orders         Ordered    NM Hepato W/EF     12/01/19 1734    dicyclomine (BENTYL) 20 MG tablet  2 times daily     12/01/19 1735           Isla Pence, MD 12/01/19 1737

## 2019-12-02 ENCOUNTER — Telehealth: Payer: Self-pay | Admitting: Internal Medicine

## 2019-12-02 ENCOUNTER — Telehealth: Payer: Self-pay | Admitting: Family Medicine

## 2019-12-02 NOTE — Telephone Encounter (Signed)
Pt and her daughter called in She wanted to give an FYI, her abdominal pain was worsening, was scheduled for CT Thursday but doesn't feel like she can wait. Wanted to be seen today due to increasing pain  Advised to go to local [facility] instead of Urgent Care since CT is needed  Will send message to PCP as well   Dr. Henrene Pastor is GI

## 2019-12-02 NOTE — Telephone Encounter (Signed)
Cancelled CT scan that was scheduled for 12/05/2019.

## 2019-12-03 NOTE — Telephone Encounter (Signed)
Pt reported that she is experiencing abdominal pain, cramping and bloating and requested to speak to an RN.

## 2019-12-03 NOTE — Telephone Encounter (Signed)
Pt states she was seen in the er and given dicyclomine 20mg  to take bid for stomach discomfort. States this has caused her to have a lot of bloating. Pt wanted to know what else she could try. Offered to move pts appt up but she declined. Discussed with pt that she could try IB Guard OTC. Pt states she will try this.

## 2019-12-05 ENCOUNTER — Ambulatory Visit (HOSPITAL_COMMUNITY): Admission: RE | Admit: 2019-12-05 | Payer: Medicare (Managed Care) | Source: Ambulatory Visit

## 2020-01-02 ENCOUNTER — Ambulatory Visit (INDEPENDENT_AMBULATORY_CARE_PROVIDER_SITE_OTHER): Payer: Medicare (Managed Care) | Admitting: Internal Medicine

## 2020-01-02 ENCOUNTER — Encounter: Payer: Self-pay | Admitting: Internal Medicine

## 2020-01-02 VITALS — BP 123/71 | HR 76 | Ht 61.0 in | Wt 146.0 lb

## 2020-01-02 DIAGNOSIS — R634 Abnormal weight loss: Secondary | ICD-10-CM | POA: Diagnosis not present

## 2020-01-02 DIAGNOSIS — R109 Unspecified abdominal pain: Secondary | ICD-10-CM | POA: Diagnosis not present

## 2020-01-02 DIAGNOSIS — K589 Irritable bowel syndrome without diarrhea: Secondary | ICD-10-CM | POA: Diagnosis not present

## 2020-01-02 DIAGNOSIS — F419 Anxiety disorder, unspecified: Secondary | ICD-10-CM

## 2020-01-02 DIAGNOSIS — R140 Abdominal distension (gaseous): Secondary | ICD-10-CM

## 2020-01-02 MED ORDER — SERTRALINE HCL 50 MG PO TABS
50.0000 mg | ORAL_TABLET | Freq: Every day | ORAL | 11 refills | Status: DC
Start: 2020-01-02 — End: 2020-03-02

## 2020-01-02 MED ORDER — RIFAXIMIN 550 MG PO TABS
550.0000 mg | ORAL_TABLET | Freq: Three times a day (TID) | ORAL | 0 refills | Status: DC
Start: 2020-01-02 — End: 2020-02-24

## 2020-01-02 NOTE — Progress Notes (Signed)
HISTORY OF PRESENT ILLNESS:  Tracey Saunders is a 64 y.o. female with a history of anxiety disorder who was seen in this office as a new patient by the GI physician assistant October 14, 2019 regarding change in bowel habits, epigastric pain, constipation, nausea, and weight loss.  She was given 2 weeks of samples of Linzess which did not help.  Continued on omeprazole.  Scheduled for colonoscopy and upper endoscopy with myself which was performed Nov 20, 2019.  Complete colonoscopy including intubation of the terminal ileum revealed a diminutive ascending colon polyp which was removed and found to be non-adenomatous.  The ileum was normal.  The colon revealed evidence of prior remote segmental colectomy in the region of the sigmoid colon.  No other abnormalities.  Follow-up in 10 years recommended.  Upper endoscopy was entirely normal.  Patient subsequently presented to the emergency room Dec 01, 2019 with ongoing abdominal complaints.  Comprehensive metabolic panel was unremarkable including liver tests and lipase.  CBC was normal with blood cell count 7.4 and hemoglobin 12.5.  A CT scan of the abdomen and pelvis with contrast was performed that same day.  There were no acute abnormalities.  She is also status post hysterectomy.  Patient is a bit agitated and frustrated over her ongoing symptoms.  She tells me that she may have several relatively normal days that has issues with bloating for several days she states that her abdomen is uncomfortable and hurts, often, after meals.  She describes occasional cramping.  No nausea or vomiting.  Eating less.  Has had 9 pound weight loss since April.  Does have bowel movements daily which she describes as small.  No fevers.  She does have a history of anxiety depression and was on medications previously.  Currently on no medication.  She worries about her gallbladder.  REVIEW OF SYSTEMS:  All non-GI ROS negative unless otherwise stated in the HPI except for  anxiety  Past Medical History:  Diagnosis Date  . Anxiety disorder 09/09/2019  . Cancer (HCC)    cervical cancer  . Depression   . Diverticulitis   . Fatty liver   . GERD (gastroesophageal reflux disease)   . Hyperlipidemia   . Hypertension   . Insomnia 09/09/2019  . Liver cyst   . Vitamin D deficiency     Past Surgical History:  Procedure Laterality Date  . ABDOMINAL HYSTERECTOMY    . COLON RESECTION     due to diverticulitis    Social History Tracey Saunders  reports that she has been smoking cigarettes. She has been smoking about 0.50 packs per day. She has never used smokeless tobacco. She reports current alcohol use. She reports that she does not use drugs.  family history is not on file.  No Known Allergies     PHYSICAL EXAMINATION: Vital signs: BP 123/71   Pulse 76   Ht 5\' 1"  (1.549 m)   Wt 146 lb (66.2 kg)   BMI 27.59 kg/m   Constitutional: generally well-appearing, no acute distress Psychiatric: alert and oriented x3, cooperative Eyes: extraocular movements intact, anicteric, conjunctiva pink Mouth: oral pharynx moist, no lesions Neck: supple no lymphadenopathy Cardiovascular: heart regular rate and rhythm, no murmur Lungs: clear to auscultation bilaterally Abdomen: soft, nontender, nondistended, no obvious ascites, no peritoneal signs, normal bowel sounds, no organomegaly Rectal: Omitted Extremities: no clubbing, cyanosis, or lower extremity edema bilaterally Skin: no lesions on visible extremities Neuro: No focal deficits. No asterixis.    ASSESSMENT:  1.  Chronic abdominal bloating of varying degrees exacerbated by meals.  Not associated with significant abnormalities in bowel habits.  Negative colonoscopy, upper endoscopy, CT scan.  Consider bacterial overgrowth.  Consider functional.   PLAN:  1.  HIDA scan to rule out gallbladder disease.  A concern of the patient's to be addressed 2.  Prescribe Xifaxan 550 mg 3 times daily for 2 weeks for  possible bacterial overgrowth.  Medication effects and risks reviewed 3.  Prescribe Zoloft 50 mg at night for likely functional abdominal complaints associated with decreased appetite and weight loss.  I described medication effects, side effects, and risks. 4.  GI office follow-up 8 weeks 30 minutes spent.  Seeing patient, reviewing outside x-rays and laboratory test, reviewing outside history, obtaining interval history personally, performing comprehensive physical exam, counseling the patient regarding her above listed issues, arranging follow-up, and ordering medications and advanced radiology test, and documenting clinical information in the health record

## 2020-01-02 NOTE — Patient Instructions (Addendum)
We have sent your demographic information and a prescription for Xifaxan to [facility]. This pharmacy is able to get medication approved through insurance and get you the lowest copay possible. If you have not heard from them within 1 week, please call our office at [phone] to let us know.   We have sent the following medications to your pharmacy for you to pick up at your convenience:  Zofran  Please follow up on 03/03/2020 at 3:00pm  You have been scheduled for a HIDA scan at [facility] (1st floor) on 01/10/2020. Please arrive 15 minutes prior to your scheduled appointment at  2:12YQ. Make certain not to have anything to eat or drink after midnight prior to your test. Should this appointment date or time not work well for you, please call radiology scheduling at [phone].  _____________________________________________________________________ hepatobiliary (HIDA) scan is an imaging procedure used to diagnose problems in the liver, gallbladder and bile ducts. In the HIDA scan, a radioactive chemical or tracer is injected into a vein in your arm. The tracer is handled by the liver like bile. Bile is a fluid produced and excreted by your liver that helps your digestive system break down fats in the foods you eat. Bile is stored in your gallbladder and the gallbladder releases the bile when you eat a meal. A special nuclear medicine scanner (gamma camera) tracks the flow of the tracer from your liver into your gallbladder and small intestine.  During your HIDA scan  You'll be asked to change into a hospital gown before your HIDA scan begins. Your health care team will position you on a table, usually on your back. The radioactive tracer is then injected into a vein in your arm.The tracer travels through your bloodstream to your liver, where it's taken up by the bile-producing cells. The radioactive tracer travels with the bile from your liver into your gallbladder and through  your bile ducts to your small intestine.You may feel some pressure while the radioactive tracer is injected into your vein. As you lie on the table, a special gamma camera is positioned over your abdomen taking pictures of the tracer as it moves through your body. The gamma camera takes pictures continually for about an hour. You'll need to keep still during the HIDA scan. This can become uncomfortable, but you may find that you can lessen the discomfort by taking deep breaths and thinking about other things. Tell your health care team if you're uncomfortable. The radiologist will watch on a computer the progress of the radioactive tracer through your body. The HIDA scan may be stopped when the radioactive tracer is seen in the gallbladder and enters your small intestine. This typically takes about an hour. In some cases extra imaging will be performed if original images aren't satisfactory, if morphine is given to help visualize the gallbladder or if the medication CCK is given to look at the contraction of the gallbladder. This test typically takes 2 hours to complete. ________________________________________________________________________

## 2020-01-06 ENCOUNTER — Telehealth: Payer: Self-pay | Admitting: Internal Medicine

## 2020-01-06 NOTE — Telephone Encounter (Signed)
Called pt.  She has not heard from [facility] yet about Xifaxan. I called [facility] and they submitted the PA today. They will check on it in a couple of days if they have not heard back. Called pt and let her know that [facility] is working on the request and are waiting to hear back from her insurance.  Let her know she should hear from them within a few days. Patient expressed understanding.

## 2020-01-06 NOTE — Telephone Encounter (Signed)
Called patient and LM to call back to discuss Xifaxan.

## 2020-01-06 NOTE — Telephone Encounter (Signed)
Pt called back returning your call °

## 2020-01-07 NOTE — Telephone Encounter (Signed)
Patient is calling states she heard from the insurance and they denied the medication in addition to that she researched it and the medication is extremely expensive please advise.

## 2020-01-08 NOTE — Telephone Encounter (Signed)
Spoke with patient and told her I had Xifaxan samples for her that I would leave up front to be picked up.  Patient agreed.

## 2020-01-08 NOTE — Telephone Encounter (Signed)
Sent patient a Pharmacist, community message that I would try to get some samples for her.  I will call her when I have them

## 2020-01-10 ENCOUNTER — Ambulatory Visit (HOSPITAL_COMMUNITY)
Admission: RE | Admit: 2020-01-10 | Discharge: 2020-01-10 | Disposition: A | Discharge status: Home / Self Care | Payer: Medicare (Managed Care) | Source: Ambulatory Visit | Attending: Internal Medicine | Admitting: Internal Medicine

## 2020-01-10 ENCOUNTER — Other Ambulatory Visit: Payer: Self-pay

## 2020-01-10 DIAGNOSIS — R109 Unspecified abdominal pain: Secondary | ICD-10-CM | POA: Insufficient documentation

## 2020-01-10 MED ORDER — TECHNETIUM TC 99M MEBROFENIN IV KIT
5.0000 | PACK | Freq: Once | INTRAVENOUS | Status: AC | PRN
  Administered 2020-01-10: 5 via INTRAVENOUS

## 2020-02-03 ENCOUNTER — Telehealth: Payer: Self-pay | Admitting: Internal Medicine

## 2020-02-03 NOTE — Telephone Encounter (Signed)
Patient called states the medication given and she would like to know what caused the issue please advise

## 2020-02-05 NOTE — Telephone Encounter (Signed)
Lm on vm 

## 2020-02-17 NOTE — Telephone Encounter (Signed)
Spoke to patient and gave her Dr. [name] suggestion along with the additional suggestion of IB Gard.  She states she has taken both of these and they dont work;  She denies eating gas causing foods.

## 2020-02-17 NOTE — Telephone Encounter (Signed)
Spoke with patient and she said the the recently prescribed Zoloft helped almost all her symptoms except she still has "extreme bloating".  Does not think Xifaxan helped significantly.  She wants to know what else she can take to address this.  She also wants to know what is causing these issues.  Please advise.

## 2020-02-17 NOTE — Telephone Encounter (Signed)
Pt is requesting a call back from a nurse regarding her medication

## 2020-02-17 NOTE — Telephone Encounter (Signed)
Continue Zoloft until her follow-up.  She could try the probiotic Align, 1 twice daily for 4 weeks.

## 2020-02-18 ENCOUNTER — Telehealth: Payer: Self-pay | Admitting: Internal Medicine

## 2020-02-18 NOTE — Telephone Encounter (Signed)
Pt wanted to know if there was something prescription for bloating. Discussed with pt that there really is not, she could try IB guard, gas-x, phazyme and eating foods that do not produce a lot of gas. Pt verbalized understanding.

## 2020-02-20 ENCOUNTER — Other Ambulatory Visit: Payer: Self-pay | Admitting: Nurse Practitioner

## 2020-02-20 ENCOUNTER — Telehealth: Payer: Self-pay

## 2020-02-20 ENCOUNTER — Other Ambulatory Visit: Payer: Self-pay

## 2020-02-20 ENCOUNTER — Ambulatory Visit (INDEPENDENT_AMBULATORY_CARE_PROVIDER_SITE_OTHER): Payer: Medicare (Managed Care) | Admitting: Nurse Practitioner

## 2020-02-20 DIAGNOSIS — R519 Headache, unspecified: Secondary | ICD-10-CM

## 2020-02-20 DIAGNOSIS — I10 Essential (primary) hypertension: Secondary | ICD-10-CM | POA: Diagnosis not present

## 2020-02-20 DIAGNOSIS — G4700 Insomnia, unspecified: Secondary | ICD-10-CM

## 2020-02-20 MED ORDER — AMLODIPINE BESYLATE 5 MG PO TABS: 5.0000 mg | ORAL_TABLET | Freq: Every day | ORAL | 1 refills | Status: DC

## 2020-02-20 MED ORDER — ZOLPIDEM TARTRATE 5 MG PO TABS: 5.0000 mg | ORAL_TABLET | Freq: Every evening | ORAL | 1 refills | Status: DC | PRN

## 2020-02-20 NOTE — Progress Notes (Addendum)
Virtual Visit via Telephone Note  I connected with Tracey Saunders on 02/20/20 at  2:00 PM EDT by telephone and verified that I am speaking with the correct person using two identifiers.   I discussed the limitations, risks, security and privacy concerns of performing an evaluation and management service by telephone and the availability of in person appointments. I also discussed with the patient that there may be a patient responsible charge related to this service. The patient expressed understanding and agreed to proceed.   History of Present Illness: Pt is a 64 year old female presenting for a telephone visit r/t her not having a ride to clinic.She is at her home and the provider is located at [facility] clinic.  Today she reports that her blood pressure has been elevated on home readings. Her readings at home have been ranging from systolic 876/811, diastolic 57-262. She denied CP, ct, gu/gi sxs, edema, palpitations, diaphoresis. She does report a "high blood pressure headache" the same headache she always has when her BP is elevated. She describes as a dull ache no snapping, thunder clap, no change in vision, no weakness, no exacerbating factors. Tylenol does decrease the headache. She reports compliance with medications. She was reminded to take blood pressure 3 hours after taking her AM blood pressure lowering medication and goal blood pressure 140/90 or less seeking medical attention for out of this range. The pt has been prescribed in the past by  Previous PCP prescribed hydralazine that she reports taking only for "blood pressure spikes" and that she no longer has, and chlorthalidone which makes her sick on her stomach and she doe snot want to take.   The pt was instructed to write down instruction as she reported not having/or being able to access her Mychart:  Take regular blood pressure medications of Losartan 50 mg daily, new prescription of Norvasc 5 mg to be taken in AM daily, take  blood pressure 3 hours after taking the medications following up on Monday also seeking medical attention for blood pressure that is out of range of 140/90 or less, CP, CT, shortness of breath, worsening, non resolving, new symptoms.    Observations/Objective: A/ox3, able to speak full sentences.   Assessment and Plan: Essential hypertension - Plan: amLODipine (NORVASC) 5 MG tablet  Nonintractable headache, unspecified chronicity pattern, unspecified headache type red flags of h/a understood by pt and when to seek medical attention. Pt is not having red flags at present.   The pt was instructed to write down instruction as she reported not having/or being able to access her Mychart:  Take regular blood pressure medications of Losartan 50 mg daily, new prescription of Norvasc 5 mg to be taken in AM daily, take blood pressure 3 hours after taking the medications following up on Monday also seeking medical attention for blood pressure that is out of range of 140/90 or less, CP, CT, shortness of breath, worsening, non resolving, new symptoms.   Follow Up Instructions:    I discussed the assessment and treatment plan with the patient. The patient was provided an opportunity to ask questions and all were answered. The patient agreed with the plan and demonstrated an understanding of the instructions.   The patient was advised to call back or seek an in-person evaluation if the symptoms worsen or if the condition fails to improve as anticipated.  I provided 15 minutes of non-face-to-face time during this encounter.  End time 2:15PM  Tracey Main, FNP

## 2020-02-20 NOTE — Telephone Encounter (Signed)
Pt called to report that he bp is running high. She checked this morning and it was 156/100. I advised pt to come in but pt states that her sister is her only transportation and she is out of town until Monday. Pt would like to know if she can take 2 of the losartan. Pt also states that this has been going on for a week and it has caused her headaches. Please advise.

## 2020-02-20 NOTE — Telephone Encounter (Signed)
Appt is scheduled for 2pm today.

## 2020-02-20 NOTE — Telephone Encounter (Signed)
Make a video appt for today. Have her take her usual medication then at the 3 hour mark take her blood pressure. Have the blood pressure monitor with her for the video visit.

## 2020-02-20 NOTE — Telephone Encounter (Signed)
Requested Prescriptions   Pending Prescriptions Disp Refills   zolpidem (AMBIEN) 5 MG tablet 15 tablet 1    Sig: Take 1 tablet (5 mg total) by mouth at bedtime as needed for sleep.     Last OV 02/20/2020   Last written 11/25/2019

## 2020-02-21 ENCOUNTER — Other Ambulatory Visit: Payer: Self-pay | Admitting: Nurse Practitioner

## 2020-02-24 ENCOUNTER — Other Ambulatory Visit: Payer: Self-pay

## 2020-02-24 ENCOUNTER — Ambulatory Visit: Payer: Medicare (Managed Care) | Admitting: Nurse Practitioner

## 2020-02-24 VITALS — BP 138/68 | HR 94 | Temp 97.6°F | Resp 18 | Wt 136.2 lb

## 2020-02-24 DIAGNOSIS — H6121 Impacted cerumen, right ear: Secondary | ICD-10-CM | POA: Diagnosis not present

## 2020-02-24 DIAGNOSIS — R519 Headache, unspecified: Secondary | ICD-10-CM | POA: Diagnosis not present

## 2020-02-24 DIAGNOSIS — J0100 Acute maxillary sinusitis, unspecified: Secondary | ICD-10-CM

## 2020-02-24 DIAGNOSIS — I10 Essential (primary) hypertension: Secondary | ICD-10-CM

## 2020-02-24 DIAGNOSIS — R7309 Other abnormal glucose: Secondary | ICD-10-CM | POA: Diagnosis not present

## 2020-02-24 MED ORDER — PREDNISONE 10 MG PO TABS: ORAL_TABLET | ORAL | 0 refills | Status: DC

## 2020-02-24 MED ORDER — LORATADINE 10 MG PO TABS: 10.0000 mg | ORAL_TABLET | Freq: Every day | ORAL | 0 refills | Status: DC

## 2020-02-24 MED ORDER — FLUTICASONE PROPIONATE 50 MCG/ACT NA SUSP: 2.0000 | Freq: Every day | NASAL | 6 refills | Status: DC

## 2020-02-24 MED ORDER — AMOXICILLIN 875 MG PO TABS: 875.0000 mg | ORAL_TABLET | Freq: Two times a day (BID) | ORAL | 0 refills | Status: DC

## 2020-02-24 NOTE — Progress Notes (Signed)
Established Patient Office Visit  Subjective:  Patient ID: Tracey Saunders, female    DOB: [birth year]  Age: 64 y.o. MRN: [id number]  CC:  Chief Complaint  Patient presents with  . Hypertension    HPI Tracey Saunders is a 64 year old female presenting for sxs of headache with facial pressure. She also brought her blood pressure readings for follow up after last weeks appt.   1  Pt brought Wed blood pressure readings in for last Wed before blood pressure medication 159-141/[id number] after taking blood pressure medication 3 hours 151-11-86/69.  2. H/a over two weeks with frontal sinus pressure. Clear nasal drainage and congestion. Reports she always keep a mild headache never gets rid of. H/a characterized as throbbing mostly pressure at the frontal sinus region. No visual changes. Eyes hurt, ears ring, taking everything otc she can for a headache like BC, Advil, tylenol , Excedrin decreases but never takes away. She has not tried anything for sinus relief.    3. Right ear cerumen impaction: Pt declined ear irrigation  No fever, chills, nausea, vomiting, no gu/gi sxs, admits to back pain but been moving boxes and heavy objects r/t moving next month.   Past Medical History:  Diagnosis Date  . Anxiety disorder 09/09/2019  . Cancer (HCC)    cervical cancer  . Depression   . Diverticulitis   . Fatty liver   . GERD (gastroesophageal reflux disease)   . Hyperlipidemia   . Hypertension   . Insomnia 09/09/2019  . Liver cyst   . Vitamin D deficiency     Past Surgical History:  Procedure Laterality Date  . ABDOMINAL HYSTERECTOMY    . COLON RESECTION     due to diverticulitis    Family History  Problem Relation Age of Onset  . Colon cancer Neg Hx   . Esophageal cancer Neg Hx   . Stomach cancer Neg Hx   . Rectal cancer Neg Hx     Social History   Socioeconomic History  . Marital status: Unknown    Spouse name: Not on file  . Number of children: 1  . Years of education: Not on file   . Highest education level: Not on file  Occupational History  . Occupation: retired  Tobacco Use  . Smoking status: Current Every Day Smoker    Packs/day: 0.50    Types: Cigarettes  . Smokeless tobacco: Never Used  Vaping Use  . Vaping Use: Never used  Substance and Sexual Activity  . Alcohol use: Yes    Comment: occasional beer or bloody mary once a week  . Drug use: Never  . Sexual activity: Not on file  Other Topics Concern  . Not on file  Social History Narrative  . Not on file   Social Determinants of Health   Financial Resource Strain:   . Difficulty of Paying Living Expenses:   Food Insecurity:   . Worried About Charity fundraiser in the Last Year:   . Arboriculturist in the Last Year:   Transportation Needs:   . Film/video editor (Medical):   Marland Kitchen Lack of Transportation (Non-Medical):   Physical Activity:   . Days of Exercise per Week:   . Minutes of Exercise per Session:   Stress:   . Feeling of Stress :   Social Connections:   . Frequency of Communication with Friends and Family:   . Frequency of Social Gatherings with Friends and Family:   . Attends Religious  Services:   . Active Member of Clubs or Organizations:   . Attends Archivist Meetings:   Marland Kitchen Marital Status:   Intimate Partner Violence:   . Fear of Current or Ex-Partner:   . Emotionally Abused:   Marland Kitchen Physically Abused:   . Sexually Abused:     Outpatient Medications Prior to Visit  Medication Sig Dispense Refill  . amLODipine (NORVASC) 5 MG tablet Take 1 tablet (5 mg total) by mouth daily. 90 tablet 1  . atorvastatin (LIPITOR) 20 MG tablet TAKE 1 TABLET BY MOUTH EVERY DAY 90 tablet 1  . losartan (COZAAR) 50 MG tablet Take 1 tablet (50 mg total) by mouth daily. 90 tablet 3  . montelukast (SINGULAIR) 10 MG tablet TAKE 1 TABLET BY MOUTH EVERYDAY AT BEDTIME 90 tablet 1  . omeprazole (PRILOSEC) 40 MG capsule TAKE 1 CAPSULE BY MOUTH EVERY DAY 90 capsule 1  . sertraline (ZOLOFT) 50 MG  tablet Take 1 tablet (50 mg total) by mouth at bedtime. 30 tablet 11  . Vitamin D, Ergocalciferol, (DRISDOL) 1.25 MG (50000 UT) CAPS capsule Take 50,000 Units by mouth every 7 (seven) days.     Marland Kitchen zolpidem (AMBIEN) 5 MG tablet Take 1 tablet (5 mg total) by mouth at bedtime as needed for sleep. 15 tablet 1  . rifaximin (XIFAXAN) 550 MG TABS tablet Take 1 tablet (550 mg total) by mouth 3 (three) times daily. 42 tablet 0   No facility-administered medications prior to visit.    No Known Allergies  ROS Review of Systems  All other systems reviewed and are negative.     Objective:    Physical Exam Vitals and nursing note reviewed.  Constitutional:      General: She is not in acute distress.    Appearance: Normal appearance. She is well-developed and well-groomed. She is not ill-appearing, toxic-appearing or diaphoretic.  HENT:     Head: Normocephalic and atraumatic.     Right Ear: Hearing, ear canal and external ear normal. There is impacted cerumen.     Left Ear: Hearing, tympanic membrane, ear canal and external ear normal.     Nose: Nasal tenderness, mucosal edema, congestion and rhinorrhea present. No septal deviation.     Right Turbinates: Swollen.     Left Turbinates: Swollen.     Right Sinus: Maxillary sinus tenderness present. No frontal sinus tenderness.     Left Sinus: Maxillary sinus tenderness present. No frontal sinus tenderness.     Mouth/Throat:     Lips: Pink.     Mouth: Mucous membranes are moist.     Dentition: No gingival swelling.     Tongue: Lesions present. Tongue deviates from midline.     Pharynx: Oropharynx is clear. Uvula midline. No pharyngeal swelling, oropharyngeal exudate or posterior oropharyngeal erythema.     Tonsils: No tonsillar exudate or tonsillar abscesses.  Eyes:     General: Lids are normal. Lids are everted, no foreign bodies appreciated. No allergic shiner.    Extraocular Movements: Extraocular movements intact.     Right eye: No  nystagmus.     Left eye: No nystagmus.     Conjunctiva/sclera: Conjunctivae normal.     Right eye: Right conjunctiva is not injected. No hemorrhage.    Left eye: Left conjunctiva is not injected. No hemorrhage.    Pupils: Pupils are equal, round, and reactive to light.  Neck:     Vascular: No carotid bruit or JVD.  Cardiovascular:     Rate and Rhythm:  Normal rate and regular rhythm.     Pulses: Normal pulses.     Heart sounds: Normal heart sounds, S1 normal and S2 normal.  Pulmonary:     Effort: Pulmonary effort is normal.     Breath sounds: Normal breath sounds.  Abdominal:     General: Abdomen is flat. Bowel sounds are normal.     Palpations: Abdomen is soft.     Tenderness: There is no abdominal tenderness. There is no right CVA tenderness, left CVA tenderness, guarding or rebound.  Musculoskeletal:        General: Normal range of motion.     Cervical back: Full passive range of motion without pain, normal range of motion and neck supple.     Right lower leg: No edema.     Left lower leg: No edema.  Lymphadenopathy:     Cervical: No cervical adenopathy.  Skin:    General: Skin is warm and dry.     Capillary Refill: Capillary refill takes less than 2 seconds.     Coloration: Skin is not jaundiced or pale.     Findings: No rash.  Neurological:     General: No focal deficit present.     Mental Status: She is alert and oriented to person, place, and time.     GCS: GCS eye subscore is 4. GCS verbal subscore is 5. GCS motor subscore is 6.     Cranial Nerves: Cranial nerves are intact.  Psychiatric:        Attention and Perception: Attention and perception normal.        Mood and Affect: Mood and affect normal.        Speech: Speech normal.        Behavior: Behavior normal. Behavior is cooperative.        Thought Content: Thought content normal.     BP 138/68 (BP Location: Left Arm, Patient Position: Sitting, Cuff Size: Normal)   Pulse 94   Temp 97.6 F (36.4 C) (Temporal)    Resp 18   Wt 136 lb 3.2 oz (61.8 kg)   SpO2 98%   BMI 25.73 kg/m  Wt Readings from Last 3 Encounters:  02/24/20 136 lb 3.2 oz (61.8 kg)  01/02/20 146 lb (66.2 kg)  12/01/19 147 lb 11.3 oz (67 kg)     Health Maintenance Due  Topic Date Due  . TETANUS/TDAP  Never done  . PAP SMEAR-Modifier  Never done  . PNA vac Low Risk Adult (1 of 2 - PCV13) Never done  . INFLUENZA VACCINE  02/09/2020    There are no preventive care reminders to display for this patient.  Lab Results  Component Value Date   TSH 2.540 06/17/2019   Lab Results  Component Value Date   WBC 7.4 12/01/2019   HGB 12.5 12/01/2019   HCT 38.4 12/01/2019   MCV 94.6 12/01/2019   PLT 239 12/01/2019   Lab Results  Component Value Date   NA 141 12/01/2019   K 3.9 12/01/2019   CO2 23 12/01/2019   GLUCOSE 106 (H) 12/01/2019   BUN 8 12/01/2019   CREATININE 0.80 12/01/2019   BILITOT 0.9 12/01/2019   ALKPHOS 60 12/01/2019   AST 17 12/01/2019   ALT 13 12/01/2019   PROT 6.3 (L) 12/01/2019   ALBUMIN 3.5 12/01/2019   CALCIUM 9.2 12/01/2019   ANIONGAP 13 12/01/2019   Lab Results  Component Value Date   CHOL 195 08/19/2019   Lab Results  Component Value Date  HDL 49 (L) 08/19/2019   Lab Results  Component Value Date   LDLCALC 114 (H) 08/19/2019   Lab Results  Component Value Date   TRIG 197 (H) 08/19/2019   Lab Results  Component Value Date   CHOLHDL 4.0 08/19/2019   No results found for: HGBA1C    Assessment & Plan:   Problem List Items Addressed This Visit      Cardiovascular and Mediastinum   Essential hypertension    Other Visit Diagnoses    Acute non-recurrent maxillary sinusitis    -  Primary   Relevant Medications   fluticasone (FLONASE) 50 MCG/ACT nasal spray   predniSONE (DELTASONE) 10 MG tablet   loratadine (CLARITIN) 10 MG tablet   amoxicillin (AMOXIL) 875 MG tablet   Sinus headache       Relevant Orders   COMPLETE METABOLIC PANEL WITH GFR   CBC with  Differential/Platelet   Urinalysis, Routine w reflex microscopic   Elevated glucose       Relevant Orders   Hemoglobin A1c   Impacted cerumen of right ear        Will complete labs today with review of 11/2019 labs with elevated glucose and acute sxs. To evaluate for contributing factors.  Sxs with examination are consistent with acute maxillary sinusitis and Sinus headache. Your headache is most likely secondary. In fact during examination the headache pressure was prevalent at the maxillary location. With the swelling an redness to your nasal passages you should take the prescribed medication and if sxs do not improve, you develop new or worsening sxs seek medical attention.   Get plenty of rest, drink plenty of water. Consider ear lavage if cannot remove disimpaction with OTC and at home ear irrigation of right ear.   Meds ordered this encounter  Medications  . fluticasone (FLONASE) 50 MCG/ACT nasal spray    Sig: Place 2 sprays into both nostrils daily.    Dispense:  16 g    Refill:  6  . predniSONE (DELTASONE) 10 MG tablet    Sig: Day one take 4 tablets Day two take 3 tables Day three take 2 tablets Day four take one tablet orally    Dispense:  10 tablet    Refill:  0  . loratadine (CLARITIN) 10 MG tablet    Sig: Take 1 tablet (10 mg total) by mouth daily.    Dispense:  30 tablet    Refill:  0  . amoxicillin (AMOXIL) 875 MG tablet    Sig: Take 1 tablet (875 mg total) by mouth 2 (two) times daily.    Dispense:  20 tablet    Refill:  0    Follow-up: Return if symptoms worsen or fail to improve.    [name], FNP

## 2020-02-25 LAB — CBC WITH DIFFERENTIAL/PLATELET
Absolute Monocytes: 564 cells/uL (ref 200–950)
Basophils Absolute: 84 cells/uL (ref 0–200)
Basophils Relative: 0.7 %
Eosinophils Absolute: 36 cells/uL (ref 15–500)
Eosinophils Relative: 0.3 %
HCT: 44.8 % (ref 35.0–45.0)
Hemoglobin: 14.7 g/dL (ref 11.7–15.5)
Lymphs Abs: 1476 cells/uL (ref 850–3900)
MCH: 31.5 pg (ref 27.0–33.0)
MCHC: 32.8 g/dL (ref 32.0–36.0)
MCV: 95.9 fL (ref 80.0–100.0)
MPV: 9.7 fL (ref 7.5–12.5)
Monocytes Relative: 4.7 %
Neutro Abs: 9840 cells/uL — ABNORMAL HIGH (ref 1500–7800)
Neutrophils Relative %: 82 %
Platelets: 333 10*3/uL (ref 140–400)
RBC: 4.67 10*6/uL (ref 3.80–5.10)
RDW: 12.8 % (ref 11.0–15.0)
Total Lymphocyte: 12.3 %
WBC: 12 10*3/uL — ABNORMAL HIGH (ref 3.8–10.8)

## 2020-02-25 LAB — COMPLETE METABOLIC PANEL WITH GFR
AG Ratio: 1.7 (calc) (ref 1.0–2.5)
ALT: 11 U/L (ref 6–29)
AST: 15 U/L (ref 10–35)
Albumin: 4.5 g/dL (ref 3.6–5.1)
Alkaline phosphatase (APISO): 74 U/L (ref 37–153)
BUN: 8 mg/dL (ref 7–25)
CO2: 24 mmol/L (ref 20–32)
Calcium: 9.7 mg/dL (ref 8.6–10.4)
Chloride: 106 mmol/L (ref 98–110)
Creat: 0.78 mg/dL (ref 0.50–0.99)
GFR, Est African American: 92 mL/min/{1.73_m2} (ref >=60)
GFR, Est Non African American: 80 mL/min/{1.73_m2} (ref >=60)
Globulin: 2.7 g/dL (calc) (ref 1.9–3.7)
Glucose, Bld: 102 mg/dL — ABNORMAL HIGH (ref 65–99)
Potassium: 4.3 mmol/L (ref 3.5–5.3)
Sodium: 138 mmol/L (ref 135–146)
Total Bilirubin: 0.4 mg/dL (ref 0.2–1.2)
Total Protein: 7.2 g/dL (ref 6.1–8.1)

## 2020-02-25 LAB — URINALYSIS, ROUTINE W REFLEX MICROSCOPIC
Bacteria, UA: NONE SEEN /HPF
Bilirubin Urine: NEGATIVE
Glucose, UA: NEGATIVE
Hgb urine dipstick: NEGATIVE
Hyaline Cast: NONE SEEN /LPF
Ketones, ur: NEGATIVE
Nitrite: NEGATIVE
Protein, ur: NEGATIVE
RBC / HPF: NONE SEEN /HPF (ref 0–2)
Specific Gravity, Urine: 1.014 (ref 1.001–1.03)
Squamous Epithelial / HPF: NONE SEEN /HPF (ref <=5)
WBC, UA: NONE SEEN /HPF (ref 0–5)
pH: 7 (ref 5.0–8.0)

## 2020-02-25 LAB — HEMOGLOBIN A1C
Hgb A1c MFr Bld: 5.2 % of total Hgb (ref <5.7)
Mean Plasma Glucose: 103 (calc)
eAG (mmol/L): 5.7 (calc)

## 2020-03-02 ENCOUNTER — Other Ambulatory Visit: Payer: Self-pay

## 2020-03-02 MED ORDER — SERTRALINE HCL 50 MG PO TABS: 50.0000 mg | ORAL_TABLET | Freq: Every day | ORAL | 3 refills | Status: DC

## 2020-03-02 NOTE — Telephone Encounter (Signed)
Not sure what to do with this request.  You had already addressed the gas issue. Do I send it to him?

## 2020-03-02 NOTE — Telephone Encounter (Signed)
Pt is requesting a 3 month supply of Zoloft, since she does not drive

## 2020-03-03 ENCOUNTER — Ambulatory Visit: Payer: Medicare (Managed Care) | Admitting: Internal Medicine

## 2020-03-05 ENCOUNTER — Telehealth: Payer: Self-pay | Admitting: *Deleted

## 2020-03-05 DIAGNOSIS — I10 Essential (primary) hypertension: Secondary | ICD-10-CM

## 2020-03-05 MED ORDER — MONTELUKAST SODIUM 10 MG PO TABS: ORAL_TABLET | ORAL | 1 refills | Status: DC

## 2020-03-05 MED ORDER — OMEPRAZOLE 40 MG PO CPDR: DELAYED_RELEASE_CAPSULE | ORAL | 1 refills | Status: DC

## 2020-03-05 MED ORDER — AMLODIPINE BESYLATE 5 MG PO TABS: 5.0000 mg | ORAL_TABLET | Freq: Every day | ORAL | 1 refills | Status: DC

## 2020-03-05 MED ORDER — ATORVASTATIN CALCIUM 20 MG PO TABS: 20.0000 mg | ORAL_TABLET | Freq: Every day | ORAL | 1 refills | Status: DC

## 2020-03-05 MED ORDER — LOSARTAN POTASSIUM 50 MG PO TABS: 50.0000 mg | ORAL_TABLET | Freq: Every day | ORAL | 3 refills | Status: DC

## 2020-03-05 NOTE — Telephone Encounter (Signed)
Received fax requesting refill on routine medications and Ambien to mail order.   Ok to refill Ambien to mail order?? If so, please clarify quantity and refills.   Last office visit 02/24/2020.  Last refill 02/20/2020, #15 tabs and #1 refill to retail pharmacy.

## 2020-03-09 ENCOUNTER — Encounter: Payer: Self-pay | Admitting: Nurse Practitioner

## 2020-03-09 ENCOUNTER — Other Ambulatory Visit: Payer: Self-pay | Admitting: Nurse Practitioner

## 2020-03-09 DIAGNOSIS — G4700 Insomnia, unspecified: Secondary | ICD-10-CM

## 2020-03-09 NOTE — Telephone Encounter (Signed)
Not approved

## 2020-03-11 ENCOUNTER — Ambulatory Visit: Payer: Medicare (Managed Care) | Admitting: Nurse Practitioner

## 2020-03-11 ENCOUNTER — Other Ambulatory Visit: Payer: Self-pay

## 2020-03-11 VITALS — BP 112/70 | HR 85 | Temp 97.6°F | Resp 18 | Wt 132.4 lb

## 2020-03-11 DIAGNOSIS — E785 Hyperlipidemia, unspecified: Secondary | ICD-10-CM | POA: Diagnosis not present

## 2020-03-11 DIAGNOSIS — I10 Essential (primary) hypertension: Secondary | ICD-10-CM | POA: Diagnosis not present

## 2020-03-11 DIAGNOSIS — E559 Vitamin D deficiency, unspecified: Secondary | ICD-10-CM | POA: Diagnosis not present

## 2020-03-11 DIAGNOSIS — G4440 Drug-induced headache, not elsewhere classified, not intractable: Secondary | ICD-10-CM

## 2020-03-11 DIAGNOSIS — F1510 Other stimulant abuse, uncomplicated: Secondary | ICD-10-CM

## 2020-03-11 DIAGNOSIS — E538 Deficiency of other specified B group vitamins: Secondary | ICD-10-CM | POA: Diagnosis not present

## 2020-03-11 DIAGNOSIS — G4700 Insomnia, unspecified: Secondary | ICD-10-CM

## 2020-03-11 DIAGNOSIS — R7309 Other abnormal glucose: Secondary | ICD-10-CM

## 2020-03-11 NOTE — Progress Notes (Signed)
Established Patient Office Visit  Subjective:  Patient ID: Tracey Saunders, female    DOB: Apr 22, 1956  Age: 64 y.o. MRN: [id number]  CC:  Chief Complaint  Patient presents with  . Insomnia    follow up, wants to discuss ambien    HPI Tracey Saunders is a 64 year old presenting today wanting to discuss two issues.  Chronic mild daily throbbing headache all over her head without aura, visual changes, nausea, vomiting, syncope, dizziness. The headaches are the same as always not different. No popping in head or ears. No falls or head injury. She shares that her sister told her she must tell the truth that she drinks 5 cups of caffnated coffee per day between 7am-2 pm because she loves her coffee. She reports taking tylenol 2-3 times a day and BC powder once a day for years but recently stopped BC she heard is was bad to take chronic daily that it could hurt her stomach.   She continues to have insomnia: She is asking again to increase her dose of Ambien to 10 mg from 5 mg. The  Reinforced that she has not completed a sleep study and the need to complete, that 10 mg is not a safe dose for her age, that Ambien is a controlled habit forming medication. Also discussed caffeine in coffee is a stimulant and contributing to keeping her awake and vasoconstrictor possibly raising blood pressure and contributing to headaches. Encouraged to weekly change one cup to decaf coffee until down to one caffeine cup per day. She did express feeling upset at the gessture and very reluctant but then was compelled to try.   Reinforced to continue 20 minutes daily 3-4 times per week of exercise with low carb/low sugar diet for blood sugar control, other CMP good results, A1C good control will continue to monitor.  Past Medical History:  Diagnosis Date  . Anxiety disorder 09/09/2019  . Cancer (HCC)    cervical cancer  . Depression   . Diverticulitis   . Fatty liver   . GERD (gastroesophageal reflux disease)   .  Hyperlipidemia   . Hypertension   . Insomnia 09/09/2019  . Liver cyst   . Vitamin D deficiency     Past Surgical History:  Procedure Laterality Date  . ABDOMINAL HYSTERECTOMY    . COLON RESECTION     due to diverticulitis    Family History  Problem Relation Age of Onset  . Colon cancer Neg Hx   . Esophageal cancer Neg Hx   . Stomach cancer Neg Hx   . Rectal cancer Neg Hx     Social History   Socioeconomic History  . Marital status: Unknown    Spouse name: Not on file  . Number of children: 1  . Years of education: Not on file  . Highest education level: Not on file  Occupational History  . Occupation: retired  Tobacco Use  . Smoking status: Current Every Day Smoker    Packs/day: 0.50    Types: Cigarettes  . Smokeless tobacco: Never Used  Vaping Use  . Vaping Use: Never used  Substance and Sexual Activity  . Alcohol use: Yes    Comment: occasional beer or bloody mary once a week  . Drug use: Never  . Sexual activity: Not on file  Other Topics Concern  . Not on file  Social History Narrative  . Not on file   Social Determinants of Health   Financial Resource Strain:   .  Difficulty of Paying Living Expenses: Not on file  Food Insecurity:   . Worried About Charity fundraiser in the Last Year: Not on file  . Ran Out of Food in the Last Year: Not on file  Transportation Needs:   . Lack of Transportation (Medical): Not on file  . Lack of Transportation (Non-Medical): Not on file  Physical Activity:   . Days of Exercise per Week: Not on file  . Minutes of Exercise per Session: Not on file  Stress:   . Feeling of Stress : Not on file  Social Connections:   . Frequency of Communication with Friends and Family: Not on file  . Frequency of Social Gatherings with Friends and Family: Not on file  . Attends Religious Services: Not on file  . Active Member of Clubs or Organizations: Not on file  . Attends Archivist Meetings: Not on file  . Marital  Status: Not on file  Intimate Partner Violence:   . Fear of Current or Ex-Partner: Not on file  . Emotionally Abused: Not on file  . Physically Abused: Not on file  . Sexually Abused: Not on file    Outpatient Medications Prior to Visit  Medication Sig Dispense Refill  . amLODipine (NORVASC) 5 MG tablet Take 1 tablet (5 mg total) by mouth daily. 90 tablet 1  . atorvastatin (LIPITOR) 20 MG tablet Take 1 tablet (20 mg total) by mouth daily. 90 tablet 1  . fluticasone (FLONASE) 50 MCG/ACT nasal spray Place 2 sprays into both nostrils daily. 16 g 6  . loratadine (CLARITIN) 10 MG tablet Take 1 tablet (10 mg total) by mouth daily. 30 tablet 0  . losartan (COZAAR) 50 MG tablet Take 1 tablet (50 mg total) by mouth daily. 90 tablet 3  . montelukast (SINGULAIR) 10 MG tablet TAKE 1 TABLET BY MOUTH EVERYDAY AT BEDTIME 90 tablet 1  . omeprazole (PRILOSEC) 40 MG capsule TAKE 1 CAPSULE BY MOUTH EVERY DAY 90 capsule 1  . sertraline (ZOLOFT) 50 MG tablet Take 1 tablet (50 mg total) by mouth at bedtime. 90 tablet 3  . Vitamin D, Ergocalciferol, (DRISDOL) 1.25 MG (50000 UT) CAPS capsule Take 50,000 Units by mouth every 7 (seven) days.     Marland Kitchen zolpidem (AMBIEN) 5 MG tablet Take 1 tablet (5 mg total) by mouth at bedtime as needed for sleep. 15 tablet 1  . amoxicillin (AMOXIL) 875 MG tablet Take 1 tablet (875 mg total) by mouth 2 (two) times daily. 20 tablet 0  . predniSONE (DELTASONE) 10 MG tablet Day one take 4 tablets Day two take 3 tables Day three take 2 tablets Day four take one tablet orally 10 tablet 0   No facility-administered medications prior to visit.    No Known Allergies  ROS Review of Systems  All other systems reviewed and are negative.     Objective:    Physical Exam Vitals and nursing note reviewed.  Constitutional:      Appearance: Normal appearance. She is well-developed and well-groomed.  HENT:     Head: Normocephalic and atraumatic.     Right Ear: Hearing, tympanic membrane,  ear canal and external ear normal.     Left Ear: Hearing, tympanic membrane, ear canal and external ear normal.     Nose: Nose normal.     Mouth/Throat:     Lips: Pink.     Mouth: Mucous membranes are moist.     Pharynx: Oropharynx is clear. Uvula midline.  Eyes:  General: Lids are normal. Lids are everted, no foreign bodies appreciated.     Extraocular Movements: Extraocular movements intact.     Conjunctiva/sclera: Conjunctivae normal.     Pupils: Pupils are equal, round, and reactive to light.  Neck:     Thyroid: No thyromegaly or thyroid tenderness.     Vascular: No carotid bruit or JVD.  Cardiovascular:     Rate and Rhythm: Normal rate and regular rhythm.     Pulses: Normal pulses.     Heart sounds: Normal heart sounds, S1 normal and S2 normal.  Pulmonary:     Effort: Pulmonary effort is normal.     Breath sounds: Normal breath sounds.  Abdominal:     General: Abdomen is flat. Bowel sounds are normal. There is no abdominal bruit.     Palpations: Abdomen is soft. There is no hepatomegaly or splenomegaly.     Tenderness: There is no abdominal tenderness. There is no right CVA tenderness, left CVA tenderness, guarding or rebound.  Musculoskeletal:        General: Normal range of motion.     Cervical back: Normal, normal range of motion and neck supple.     Thoracic back: Normal.     Lumbar back: Normal.     Right lower leg: No edema.     Left lower leg: No edema.  Lymphadenopathy:     Head:     Right side of head: No submental, submandibular or tonsillar adenopathy.     Left side of head: No submental, submandibular or tonsillar adenopathy.     Cervical: No cervical adenopathy.  Skin:    General: Skin is warm and dry.     Capillary Refill: Capillary refill takes less than 2 seconds.     Coloration: Skin is not cyanotic, jaundiced or pale.  Neurological:     General: No focal deficit present.     Mental Status: She is alert and oriented to person, place, and time.  Mental status is at baseline.     GCS: GCS eye subscore is 4. GCS verbal subscore is 5. GCS motor subscore is 6.     Cranial Nerves: Cranial nerves are intact.     Gait: Gait normal.  Psychiatric:        Attention and Perception: Attention and perception normal.        Mood and Affect: Mood and affect normal.        Speech: Speech normal.        Behavior: Behavior normal. Behavior is cooperative.        Thought Content: Thought content normal.        Cognition and Memory: Cognition normal.        Judgment: Judgment normal.     BP 112/70 (BP Location: Left Arm, Patient Position: Sitting, Cuff Size: Normal)   Pulse 85   Temp 97.6 F (36.4 C) (Temporal)   Resp 18   Wt 132 lb 6.4 oz (60.1 kg)   SpO2 98%   BMI 25.02 kg/m  Wt Readings from Last 3 Encounters:  03/11/20 132 lb 6.4 oz (60.1 kg)  02/24/20 136 lb 3.2 oz (61.8 kg)  01/02/20 146 lb (66.2 kg)     Health Maintenance Due  Topic Date Due  . TETANUS/TDAP  Never done  . PAP SMEAR-Modifier  Never done  . PNA vac Low Risk Adult (1 of 2 - PCV13) Never done  . INFLUENZA VACCINE  Never done    There are no preventive care  reminders to display for this patient.  Lab Results  Component Value Date   TSH 2.540 06/17/2019   Lab Results  Component Value Date   WBC 12.0 (H) 02/24/2020   HGB 14.7 02/24/2020   HCT 44.8 02/24/2020   MCV 95.9 02/24/2020   PLT 333 02/24/2020   Lab Results  Component Value Date   NA 138 02/24/2020   K 4.3 02/24/2020   CO2 24 02/24/2020   GLUCOSE 102 (H) 02/24/2020   BUN 8 02/24/2020   CREATININE 0.78 02/24/2020   BILITOT 0.4 02/24/2020   ALKPHOS 60 12/01/2019   AST 15 02/24/2020   ALT 11 02/24/2020   PROT 7.2 02/24/2020   ALBUMIN 3.5 12/01/2019   CALCIUM 9.7 02/24/2020   ANIONGAP 13 12/01/2019   Lab Results  Component Value Date   CHOL 195 08/19/2019   Lab Results  Component Value Date   HDL 49 (L) 08/19/2019   Lab Results  Component Value Date   LDLCALC 114 (H) 08/19/2019    Lab Results  Component Value Date   TRIG 197 (H) 08/19/2019   Lab Results  Component Value Date   CHOLHDL 4.0 08/19/2019   Lab Results  Component Value Date   HGBA1C 5.2 02/24/2020      Assessment & Plan:   Problem List Items Addressed This Visit      Cardiovascular and Mediastinum   Essential hypertension - Primary   Relevant Orders   Hemoglobin A1c   Lipid panel   CBC with Differential/Platelet   COMPLETE METABOLIC PANEL WITH GFR   TSH   VITAMIN D 25 Hydroxy (Vit-D Deficiency, Fractures)   T4, Free   Vitamin B12     Other   Hyperlipidemia   Relevant Orders   Hemoglobin A1c   Lipid panel   CBC with Differential/Platelet   COMPLETE METABOLIC PANEL WITH GFR   TSH   VITAMIN D 25 Hydroxy (Vit-D Deficiency, Fractures)   T4, Free   Vitamin B12   Vitamin B12 deficiency   Relevant Orders   Hemoglobin A1c   Lipid panel   CBC with Differential/Platelet   COMPLETE METABOLIC PANEL WITH GFR   TSH   VITAMIN D 25 Hydroxy (Vit-D Deficiency, Fractures)   T4, Free   Vitamin B12   Vitamin D deficiency   Relevant Orders   Hemoglobin A1c   Lipid panel   CBC with Differential/Platelet   COMPLETE METABOLIC PANEL WITH GFR   TSH   VITAMIN D 25 Hydroxy (Vit-D Deficiency, Fractures)   T4, Free   Vitamin B12   Insomnia   Relevant Orders   PSG Sleep Study   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HCC)   Headache, drug induced    Other Visit Diagnoses    Elevated glucose       Relevant Orders   Hemoglobin A1c   Lipid panel   CBC with Differential/Platelet   COMPLETE METABOLIC PANEL WITH GFR   TSH   VITAMIN D 25 Hydroxy (Vit-D Deficiency, Fractures)   T4, Free   Vitamin B12    see HPI  No orders of the defined types were placed in this encounter.   Follow-up: Return in about 6 months (around 09/08/2020).    [name], FNP

## 2020-03-12 DIAGNOSIS — G4440 Drug-induced headache, not elsewhere classified, not intractable: Secondary | ICD-10-CM | POA: Insufficient documentation

## 2020-03-12 DIAGNOSIS — F1510 Other stimulant abuse, uncomplicated: Secondary | ICD-10-CM | POA: Insufficient documentation

## 2020-03-18 ENCOUNTER — Telehealth: Payer: Self-pay | Admitting: Nurse Practitioner

## 2020-03-18 DIAGNOSIS — F5104 Psychophysiologic insomnia: Secondary | ICD-10-CM

## 2020-03-18 NOTE — Telephone Encounter (Signed)
See note

## 2020-03-18 NOTE — Telephone Encounter (Signed)
CB# [id number] Refill Ambien [facility] - aslo would like to speak with the nurse why Cyrstal only prescribe 15 tablets

## 2020-03-23 ENCOUNTER — Other Ambulatory Visit: Payer: Self-pay | Admitting: Nurse Practitioner

## 2020-03-23 DIAGNOSIS — G4700 Insomnia, unspecified: Secondary | ICD-10-CM

## 2020-03-23 MED ORDER — ZOLPIDEM TARTRATE 5 MG PO TABS: 5.0000 mg | ORAL_TABLET | Freq: Every evening | ORAL | 1 refills | Status: DC | PRN

## 2020-03-23 NOTE — Telephone Encounter (Signed)
[facility]. Sleep study and decrease coffee/caffeine.

## 2020-03-30 ENCOUNTER — Other Ambulatory Visit: Payer: Self-pay

## 2020-03-30 MED ORDER — SERTRALINE HCL 50 MG PO TABS: 50.0000 mg | ORAL_TABLET | Freq: Every day | ORAL | 0 refills | Status: DC

## 2020-04-03 MED ORDER — TRAZODONE HCL 50 MG PO TABS: 25.0000 mg | ORAL_TABLET | Freq: Every evening | ORAL | 3 refills | Status: DC | PRN

## 2020-04-03 NOTE — Telephone Encounter (Signed)
Referral placed for sleep study. Patient requested home study to be performed.  Information given about psychiatry to follow up.   Prescription sent to pharmacy for Trazodone.    Patient will FR/U with PCP in regards to if this has helped.

## 2020-04-03 NOTE — Telephone Encounter (Signed)
Call placed to patient to inquire.   Reports that she is having increased insomnia. States that she did receive Ambien 5mg   #30 tabs from mail order, but states that it is minimally effective. Reports that she has decreased caffeine throughout the day, is practicing good sleep habits, and is taking the Ambien at 9pm. Reports that even with all the above, she is staying awake until 4am.   Per chart notes, NP recommended sleep study. Patient states that she has not received any further information in regards to sleep study.   MD please advise.

## 2020-04-03 NOTE — Telephone Encounter (Signed)
CB# Pt would like speak to the nurse concerning refill Ambien

## 2020-04-03 NOTE — Telephone Encounter (Signed)
°   Okay to referl for sleep study Dx Chronic insomnia  I also noted in chart where she saw Pyschiatry back in April, I would recommend following up with them as well as stressors can cause insomnia. I dont recommend increasing the  ambien.  She can try Trazodone 50mg  at bedtime for sleep until she gets back in with her psychiatrist

## 2020-04-14 ENCOUNTER — Other Ambulatory Visit: Payer: Self-pay | Admitting: Family Medicine

## 2020-04-14 DIAGNOSIS — I10 Essential (primary) hypertension: Secondary | ICD-10-CM

## 2020-04-14 MED ORDER — LOSARTAN POTASSIUM 50 MG PO TABS: 50.0000 mg | ORAL_TABLET | Freq: Every day | ORAL | 3 refills | Status: DC

## 2020-04-14 NOTE — Telephone Encounter (Signed)
Prescription sent to pharmacy.

## 2020-04-14 NOTE — Telephone Encounter (Signed)
Refill Losartan --[facility] - [place], [place]

## 2020-04-21 ENCOUNTER — Telehealth: Payer: Self-pay | Admitting: Internal Medicine

## 2020-04-21 MED ORDER — SERTRALINE HCL 50 MG PO TABS
50.0000 mg | ORAL_TABLET | Freq: Every day | ORAL | 0 refills | Status: DC
Start: 2020-04-21 — End: 2020-06-19

## 2020-04-21 NOTE — Telephone Encounter (Signed)
Patient needs to refill Zolof for 90-day supply. [facility] fax is [phone].

## 2020-04-21 NOTE — Telephone Encounter (Signed)
Refilled Zoloft; patient needs office visit for further refills

## 2020-05-01 ENCOUNTER — Other Ambulatory Visit: Payer: Self-pay

## 2020-05-01 ENCOUNTER — Ambulatory Visit: Payer: Medicare (Managed Care) | Admitting: Family Medicine

## 2020-05-01 ENCOUNTER — Encounter: Payer: Self-pay | Admitting: Family Medicine

## 2020-05-01 VITALS — BP 104/60 | HR 94 | Temp 98.1°F | Ht 61.0 in | Wt 127.2 lb

## 2020-05-01 DIAGNOSIS — I10 Essential (primary) hypertension: Secondary | ICD-10-CM | POA: Diagnosis not present

## 2020-05-01 DIAGNOSIS — R634 Abnormal weight loss: Secondary | ICD-10-CM

## 2020-05-01 DIAGNOSIS — G4700 Insomnia, unspecified: Secondary | ICD-10-CM

## 2020-05-01 MED ORDER — LOSARTAN POTASSIUM 50 MG PO TABS: 25.0000 mg | ORAL_TABLET | Freq: Every day | ORAL | 3 refills | Status: DC

## 2020-05-01 NOTE — Assessment & Plan Note (Signed)
Bp is low for her age and with weight loss Decrease losartan to  25mg  once a day, continue norvasc I was going to D/C norvasc but she wanted to keep both meds and just reduce one, states in past BP would spike up on just 1 med  Reviewed recent labs at bedside

## 2020-05-01 NOTE — Assessment & Plan Note (Signed)
Continue trazodone 25-50mg  at bedtime zoloft for anxiety per GI, also has seen psychaitry

## 2020-05-01 NOTE — Patient Instructions (Addendum)
Reduce losartan to 25mg   aday  ( 1/2 tablet) We will call with lab results Okay to take 1 full tablet of trazodone 50mg  once a day  F/U 4 months

## 2020-05-01 NOTE — Progress Notes (Signed)
Subjective:    Patient ID: Tracey Saunders, female    DOB: 1956/04/01, 64 y.o.   MRN: [id number]  Patient presents for Follow-up (Chronic medical conditions)   Pt here to f/u chronic medical problEMS   She had a fall 2  weeks, she tripped over a clothes basket  Then 4 days ago, she she was unable to get up and walkd    She moved from [facility] a year ago, she wsaa seen by Dr. Henrene Pastor, has   she was placed on zoloft for her anxiety    She has chronic insomnia she has been taking trazodone 1/2 to 1 tablet  She was [place] in the past    She was 175lbss, 1 year ago, now down to 127lbs, she eats mostly protein, veggies, fruits, no fast food, no fried foods, she eats one meal a day with a few snacks, does not eat after 4pm Walks her dog 3-4 times a day  HTN- she been taking norvasc 5mg  and losartan 50mg    Asthma/allergies- she takes singulair , uses primatine   Vitamin D and Caclium  She does have history of chronic back pain, she has been doing a lot heavy lifting now resolved  She used ICE and  Asperme     Declines flu shot  Review Of Systems:  GEN- denies fatigue, fever, weight loss,weakness, recent illness HEENT- denies eye drainage, change in vision, nasal discharge, CVS- denies chest pain, palpitations RESP- denies SOB, cough, wheeze ABD- denies N/V, change in stools, abd pain GU- denies dysuria, hematuria, dribbling, incontinence MSK- denies joint pain, muscle aches, injury Neuro- denies headache, dizziness, syncope, seizure activity       Objective:    BP 104/60 (BP Location: Right Arm, Patient Position: Sitting, Cuff Size: Normal)   Pulse 94   Temp 98.1 F (36.7 C) (Oral)   Ht 5\' 1"  (1.549 m)   Wt 127 lb 3.2 oz (57.7 kg)   SpO2 98%   BMI 24.03 kg/m  GEN- NAD, alert and oriented x3 HEENT- PERRL, EOMI, non injected sclera, pink conjunctiva, MMM, oropharynx clear Neck- Supple, no thyromegaly CVS- RRR, no murmur RESP-CTAB ABD-NABS,soft,NT,ND MSK Spine NT, fair ROM  spine, hips Psych - normal affect and mood  EXT- No edema Pulses- Radial, DP- 2+        Assessment & Plan:      Problem List Items Addressed This Visit      Unprioritized   Essential hypertension    Bp is low for her age and with weight loss Decrease losartan to  25mg  once a day, continue norvasc I was going to D/C norvasc but she wanted to keep both meds and just reduce one, states in past BP would spike up on just 1 med  Reviewed recent labs at bedside       Relevant Medications   losartan (COZAAR) 50 MG tablet   Other Relevant Orders   TSH   T3, free   T4, free   Insomnia    Continue trazodone 25-50mg  at bedtime zoloft for anxiety per GI, also has seen psychaitry        Other Visit Diagnoses    Weight loss    -  Primary   she has very stict diet and has a prolonged fasting state, likely contributing to weight loss. will check TSH. Discussed cancer work up is next but she declines Recommend she get a scale and weight once a week, to see if she continues to steadily decline  Relevant Orders   TSH   T3, free   T4, free      Note: This dictation was prepared with Dragon dictation along with smaller phrase technology. Any transcriptional errors that result from this process are unintentional.

## 2020-05-11 ENCOUNTER — Telehealth: Payer: Self-pay | Admitting: *Deleted

## 2020-05-11 NOTE — Telephone Encounter (Signed)
Received call from patient.   Reports that she has been taking Trazodone 50mg  to help her sleep, but feels that this is not working.   Inquired if there is another option to help her rest.   MD please advise.

## 2020-05-12 NOTE — Telephone Encounter (Signed)
Sent message thru MyChart 

## 2020-05-12 NOTE — Telephone Encounter (Signed)
Increase trazodone to 100mg  at bedtime, take 1 hour before bedtime Try this for 2 weeks, if it does not work then we can consider something else

## 2020-05-31 ENCOUNTER — Encounter: Payer: Self-pay | Admitting: Family Medicine

## 2020-06-01 NOTE — Telephone Encounter (Signed)
Call placed to patient. LMTRC.  

## 2020-06-08 ENCOUNTER — Other Ambulatory Visit: Payer: Self-pay | Admitting: *Deleted

## 2020-06-08 MED ORDER — LORATADINE 10 MG PO TABS: 10.0000 mg | ORAL_TABLET | Freq: Every day | ORAL | 11 refills | Status: DC

## 2020-06-10 ENCOUNTER — Ambulatory Visit: Payer: Medicare (Managed Care) | Admitting: Family Medicine

## 2020-06-19 ENCOUNTER — Ambulatory Visit: Payer: Medicare (Managed Care) | Admitting: Internal Medicine

## 2020-06-19 ENCOUNTER — Encounter: Payer: Self-pay | Admitting: Internal Medicine

## 2020-06-19 VITALS — BP 112/80 | HR 93 | Ht 61.0 in | Wt 129.0 lb

## 2020-06-19 DIAGNOSIS — K589 Irritable bowel syndrome without diarrhea: Secondary | ICD-10-CM | POA: Diagnosis not present

## 2020-06-19 DIAGNOSIS — R140 Abdominal distension (gaseous): Secondary | ICD-10-CM

## 2020-06-19 DIAGNOSIS — R109 Unspecified abdominal pain: Secondary | ICD-10-CM | POA: Diagnosis not present

## 2020-06-19 MED ORDER — SERTRALINE HCL 50 MG PO TABS: 50.0000 mg | ORAL_TABLET | Freq: Every day | ORAL | 3 refills | Status: DC

## 2020-06-19 NOTE — Patient Instructions (Signed)
If you are age 66 or older, your body mass index should be between 23-30. Your Body mass index is 24.37 kg/m. If this is out of the aforementioned range listed, please consider follow up with your Primary Care Provider.  If you are age 82 or younger, your body mass index should be between 19-25. Your Body mass index is 24.37 kg/m. If this is out of the aformentioned range listed, please consider follow up with your Primary Care Provider.   We have refilled your Zoloft for a [facility] supply [facility]  Please follow up with [place] in 6 months for medication review.  It was great seeing you today!  Thank you for entrusting me with your care and choosing [facility].  Dr. Henrene Pastor

## 2020-06-19 NOTE — Progress Notes (Signed)
HISTORY OF PRESENT ILLNESS:  Tracey Saunders is a 64 y.o. female who was new to this practice October 14, 2019 regarding change in bowel habits, epigastric pain, constipation, nausea, and weight loss.  See that dictation.  She underwent extensive work-up including blood work, CT imaging, colonoscopy, and upper endoscopy.  No significant abnormalities.  She was seen in follow-up in the office by myself January 02, 2020.  She complained of chronic abdominal bloating of varying degrees exacerbated by meals.  She was prescribed Xifaxan for 2 weeks for possible bacterial overgrowth.  As well she was prescribed Zoloft for likely functional abdominal complaints.  She was asked to follow-up in 8 weeks but presents for follow-up at this time.  He did undergo a HIDA scan which did not show any evidence of chronic cholecystitis.  Patient tells me that several weeks after completing Xifaxan initiating Zoloft, she was feeling much better.  She states she is having no abdominal complaints.  She is quite pleased.  She wishes to have her Zoloft prescription refilled.  She is currently taking 50 mg at night.  No appreciable side effects.  No new complaints.  REVIEW OF SYSTEMS:  All non-GI ROS negative unless otherwise stated in the HPI.  Past Medical History:  Diagnosis Date  . Anxiety disorder 09/09/2019  . Cancer (HCC)    cervical cancer  . Depression   . Diverticulitis   . Fatty liver   . GERD (gastroesophageal reflux disease)   . Hyperlipidemia   . Hypertension   . Insomnia 09/09/2019  . Liver cyst   . Vitamin D deficiency     Past Surgical History:  Procedure Laterality Date  . ABDOMINAL HYSTERECTOMY    . COLON RESECTION     due to diverticulitis    Social History [name]  reports that she has been smoking cigarettes. She has been smoking about 0.50 packs per day. She has never used smokeless tobacco. She reports current alcohol use. She reports that she does not use drugs.  family history is not on  file.  No Known Allergies     PHYSICAL EXAMINATION: Vital signs: BP 112/80   Pulse 93   Ht 5\' 1"  (1.549 m)   Wt 129 lb (58.5 kg)   SpO2 96%   BMI 24.37 kg/m   Constitutional: generally well-appearing, no acute distress Psychiatric: alert and oriented x3, cooperative Eyes: extraocular movements intact, anicteric, conjunctiva pink Mouth: oral pharynx moist, no lesions Neck: supple no lymphadenopathy Cardiovascular: heart regular rate and rhythm, no murmur Lungs: clear to auscultation bilaterally Abdomen: soft, nontender, nondistended, no obvious ascites, no peritoneal signs, normal bowel sounds, no organomegaly Rectal: Omitted Extremities: no clubbing, cyanosis, or lower extremity edema bilaterally Skin: no lesions on visible extremities Neuro: No focal deficits.  Cranial nerves  ASSESSMENT:  1.  Patient's abdominal complaints have improved after course of Xifaxan and initiation of Zoloft.  I suspect that her principal problem is functional abdominal complaints which have responded to SSRI therapy. 2.  Extensive GI evaluation, all negative.  PLAN:  1.  Continue Zoloft 50 mg at night.  Prescription refilled.  Medication side effects reviewed 2.  GI office follow-up 6 months.  Contact the office in the interim for any questions or problems 3.  Routine screening colonoscopy 10 years 4.  Resume general medical care with PCP

## 2020-06-22 ENCOUNTER — Encounter: Payer: Self-pay | Admitting: Family Medicine

## 2020-06-22 ENCOUNTER — Other Ambulatory Visit: Payer: Self-pay

## 2020-06-22 ENCOUNTER — Ambulatory Visit (INDEPENDENT_AMBULATORY_CARE_PROVIDER_SITE_OTHER): Payer: Medicare (Managed Care) | Admitting: Family Medicine

## 2020-06-22 VITALS — BP 130/82 | HR 98 | Temp 97.9°F | Resp 16 | Ht 61.0 in | Wt 129.0 lb

## 2020-06-22 DIAGNOSIS — M5441 Lumbago with sciatica, right side: Secondary | ICD-10-CM | POA: Diagnosis not present

## 2020-06-22 DIAGNOSIS — F419 Anxiety disorder, unspecified: Secondary | ICD-10-CM | POA: Diagnosis not present

## 2020-06-22 DIAGNOSIS — R002 Palpitations: Secondary | ICD-10-CM

## 2020-06-22 DIAGNOSIS — G8929 Other chronic pain: Secondary | ICD-10-CM

## 2020-06-22 MED ORDER — SERTRALINE HCL 50 MG PO TABS
75.0000 mg | ORAL_TABLET | Freq: Every day | ORAL | 3 refills | Status: DC
Start: 2020-06-22 — End: 2020-07-06

## 2020-06-22 MED ORDER — TRAMADOL HCL 50 MG PO TABS: 50.0000 mg | ORAL_TABLET | Freq: Three times a day (TID) | ORAL | 0 refills | Status: AC | PRN

## 2020-06-22 NOTE — Progress Notes (Signed)
Subjective:    Patient ID: Tracey Saunders, female    DOB: 13-May-1956, 64 y.o.   MRN: [id number]  Patient presents for Back Pain (Lower back pain that caused issues with her gait, has been walking a lot to help) and Anxiety (Increased stress in her life- causing BP spikes)   Pt here with ongoing pain, she had a fall a few months ago, but has chronic back pain. Per last visi she uses aspercreme and ICE   she states her back was hurting a lot, she walked   she had shooting pains and numbness and tingling down her legs   she states OTC meds have not helped  interested in imaging but    Anxiety- she has felt some palpitations when she gets really upset  this past week she had had increased palptations and felt very stressed.   she has been on zoloft for her stomach she has IBS  she is sleeping okay She has been worried about family stuff and bills  States her bp will go up when she is stressed  No SOB      Review Of Systems:  GEN- denies fatigue, fever, weight loss,weakness, recent illness HEENT- denies eye drainage, change in vision, nasal discharge, CVS- denies chest pain, palpitations RESP- denies SOB, cough, wheeze ABD- denies N/V, change in stools, abd pain GU- denies dysuria, hematuria, dribbling, incontinence MSK- denies joint pain, muscle aches, injury Neuro- denies headache, dizziness, syncope, seizure activity       Objective:    BP 130/82   Pulse 98   Temp 97.9 F (36.6 C) (Temporal)   Resp 16   Ht 5\' 1"  (1.549 m)   Wt 129 lb (58.5 kg)   SpO2 96%   BMI 24.37 kg/m  GEN- NAD, alert and oriented x3 HEENT- PERRL, EOMI, non injected sclera, pink conjunctiva, MMM, oropharynx clear CVS- RRR, no murmur RESP-CTAB ABD-NABS,soft,NT,ND Psych- a little anxious, not depressed, no SI, well groomed MSK- Mild TTP Lumbar spine, fair ROM, neg SLR, fair ROM hips/knees Neuro- mild antalgic gait, sensation grossly in tact, strength in tact blat, normal tone  EXT- No  edema Pulses- Radial, DP- 2+  EKG NSR, poor r wave progression, compared to 2020      Assessment & Plan:      Problem List Items Addressed This Visit      Unprioritized   Anxiety disorder    I think anxiety is contributing to the palpitations and symptoms EKG no acute changes Increase zoloft to 75mg  once a day, will slowly titrate up If she has chest  Pain, palpitations despite good anxiety treatment will refer back to cardiology for event monitor      Relevant Medications   sertraline (ZOLOFT) 50 MG tablet   Chronic right-sided low back pain with sciatica    Chronic back pain, no imaging in chart Pt to get xrays, but states she can not do so until beginning of Jan, due to transportation issues Given ultram prn for now No red flags      Relevant Medications   sertraline (ZOLOFT) 50 MG tablet   traMADol (ULTRAM) 50 MG tablet   Other Relevant Orders   DG Lumbar Spine Complete    Other Visit Diagnoses    Palpitations    -  Primary   Relevant Orders   EKG 12-Lead (Completed)      Note: This dictation was prepared with Dragon dictation along with smaller phrase technology. Any transcriptional errors that result from this  process are unintentional.

## 2020-06-22 NOTE — Patient Instructions (Addendum)
Get xrays done [facility]   [address] Use the tramadol for back pain as needed Increase zoloft to 75mg  once a day  F/U Feb

## 2020-06-23 ENCOUNTER — Encounter: Payer: Self-pay | Admitting: Family Medicine

## 2020-06-23 NOTE — Assessment & Plan Note (Signed)
I think anxiety is contributing to the palpitations and symptoms EKG no acute changes Increase zoloft to 75mg  once a day, will slowly titrate up If she has chest  Pain, palpitations despite good anxiety treatment will refer back to cardiology for event monitor

## 2020-06-23 NOTE — Assessment & Plan Note (Signed)
Chronic back pain, no imaging in chart Pt to get xrays, but states she can not do so until beginning of Jan, due to transportation issues Given ultram prn for now No red flags

## 2020-06-25 ENCOUNTER — Ambulatory Visit
Admission: RE | Admit: 2020-06-25 | Discharge: 2020-06-25 | Disposition: A | Discharge status: Home / Self Care | Payer: Medicare (Managed Care) | Source: Ambulatory Visit | Attending: Family Medicine | Admitting: Family Medicine

## 2020-06-25 DIAGNOSIS — G8929 Other chronic pain: Secondary | ICD-10-CM

## 2020-07-06 ENCOUNTER — Other Ambulatory Visit: Payer: Self-pay

## 2020-07-06 ENCOUNTER — Other Ambulatory Visit: Payer: Self-pay | Admitting: Family Medicine

## 2020-07-06 MED ORDER — SERTRALINE HCL 50 MG PO TABS: 75.0000 mg | ORAL_TABLET | Freq: Every day | ORAL | 3 refills | Status: DC

## 2020-07-06 MED ORDER — TRAZODONE HCL 50 MG PO TABS: 100.0000 mg | ORAL_TABLET | Freq: Every evening | ORAL | 3 refills | Status: DC | PRN

## 2020-07-06 NOTE — Telephone Encounter (Signed)
Patient now uses [facility] for all prescriptions she doesn't drive and she needs them sent here going forward.  She is requesting a refill on her trazodone she states that Dr. Jeanice Lim increased it to 2 a night. She states this is the only thing that helps her sleep.  She also needs her sertraline called into [facility] as well.  CB# [phone]

## 2020-07-08 ENCOUNTER — Encounter: Payer: Self-pay | Admitting: *Deleted

## 2020-07-14 ENCOUNTER — Other Ambulatory Visit: Payer: Self-pay | Admitting: *Deleted

## 2020-07-14 DIAGNOSIS — I10 Essential (primary) hypertension: Secondary | ICD-10-CM

## 2020-07-14 MED ORDER — LOSARTAN POTASSIUM 50 MG PO TABS: 25.0000 mg | ORAL_TABLET | Freq: Every day | ORAL | 3 refills | Status: DC

## 2020-07-31 ENCOUNTER — Telehealth: Payer: Self-pay | Admitting: Family Medicine

## 2020-07-31 NOTE — Telephone Encounter (Signed)
lvm to call the office about her [place] paper not filled out correctly by the pt

## 2020-08-14 ENCOUNTER — Other Ambulatory Visit: Payer: Self-pay

## 2020-08-14 ENCOUNTER — Encounter: Payer: Self-pay | Admitting: Family Medicine

## 2020-08-14 ENCOUNTER — Ambulatory Visit: Payer: Medicare (Managed Care) | Admitting: Family Medicine

## 2020-08-14 VITALS — BP 130/74 | HR 80 | Temp 98.1°F | Resp 16 | Ht 61.0 in | Wt 131.0 lb

## 2020-08-14 DIAGNOSIS — I10 Essential (primary) hypertension: Secondary | ICD-10-CM | POA: Diagnosis not present

## 2020-08-14 DIAGNOSIS — F419 Anxiety disorder, unspecified: Secondary | ICD-10-CM

## 2020-08-14 DIAGNOSIS — M47816 Spondylosis without myelopathy or radiculopathy, lumbar region: Secondary | ICD-10-CM

## 2020-08-14 DIAGNOSIS — M51369 Other intervertebral disc degeneration, lumbar region without mention of lumbar back pain or lower extremity pain: Secondary | ICD-10-CM

## 2020-08-14 DIAGNOSIS — G4700 Insomnia, unspecified: Secondary | ICD-10-CM

## 2020-08-14 DIAGNOSIS — M5441 Lumbago with sciatica, right side: Secondary | ICD-10-CM

## 2020-08-14 DIAGNOSIS — G8929 Other chronic pain: Secondary | ICD-10-CM

## 2020-08-14 DIAGNOSIS — M5136 Other intervertebral disc degeneration, lumbar region: Secondary | ICD-10-CM

## 2020-08-14 MED ORDER — TIZANIDINE HCL 4 MG PO TABS: 4.0000 mg | ORAL_TABLET | Freq: Four times a day (QID) | ORAL | 0 refills | Status: DC | PRN

## 2020-08-14 MED ORDER — TRAMADOL HCL 50 MG PO TABS: ORAL_TABLET | ORAL | 1 refills | Status: DC

## 2020-08-14 NOTE — Assessment & Plan Note (Signed)
Discussed with patient I would not recommend increasing her other medications at this time for sleep as she is now on pain medicine and muscle relaxer.  Get her pain under control her sleep may improve.  We did discuss going to psychiatry however she declines states that it never helped her in the past.

## 2020-08-14 NOTE — Progress Notes (Signed)
Subjective:    Patient ID: Tracey Saunders, female    DOB: 30-Jun-1956, 65 y.o.   MRN: [id number]  Patient presents for Back Pain, R Leg Pain (States that she feels like it's numb), and Insomnia (Hard to go to sleep)  Patient here to follow-up back pain.  At her last visit she had complained of chronic back pain.  She had x-ray done which showed degenerative disc disease and arthritis.  Recommended physical therapy however we were unable to get her on the phone to get results therefore letter was mailed.  She was also prescribed tramadol however she states she was unable to get to the pharmacy to pick it up therefore cannot had anything for pain.  She states that her leg pain continues to worsen.  At times it feels like her leg is numb and the leg is heavy.  This is been going on for many years but been progressively worse over the past year.  She would like to proceed with the next steps.  Last anxiety her Zoloft was increased to 75 mg once a day she was having high anxiety along with some palpitations.  Very stressed over finances and some family issues that she did not want to go into specifics about. She is on trazodone 100mg  at bedtime for sleep but she still does not sleep well most because of pain.  She had also brought in some type of transportation form but she had filled out incorrectly and I could not tell what her need was for. We advised her to get a new form     Due for thyroid check   HTn taking bp meds norvasc and losartan w/o SE She states that she does not like getting her blood drawn here at the office they have difficulty getting her blood therefore she is use getting it drawn the last few times.   Review Of Systems:  GEN- denies fatigue, fever, weight loss,weakness, recent illness HEENT- denies eye drainage, change in vision, nasal discharge, CVS- denies chest pain, palpitations RESP- denies SOB, cough, wheeze ABD- denies N/V, change in stools, abd pain GU- denies  dysuria, hematuria, dribbling, incontinence MSK- +joint pain, muscle aches, injury Neuro- denies headache, dizziness, syncope, seizure activity       Objective:    BP 130/74   Pulse 80   Temp 98.1 F (36.7 C) (Temporal)   Resp 16   Ht 5\' 1"  (1.549 m)   Wt 131 lb (59.4 kg)   SpO2 96%   BMI 24.75 kg/m  GEN- NAD, alert and oriented x3 HEENT- PERRL, EOMI, non injected sclera, pink conjunctiva, CVS RRR no murmur RESP CTAB ABD-NABS,soft,NT,ND Psych-normal affect and mood  MSK- Mild TTP Lumbar spine, + spasm left side  fair ROM, neg SLR, fair ROM hips/knees Neuro- mild antalgic gait, sensation grossly in tact, strength in tact blat, normal tone ,walks with cane  EXT- No edema Pulses- Radial,  2+       Assessment & Plan:      Problem List Items Addressed This Visit      Unprioritized   Anxiety disorder    Discussed with patient I would not recommend increasing her other medications at this time for sleep as she is now on pain medicine and muscle relaxer.  Get her pain under control her sleep may improve.  We did discuss going to psychiatry however she declines states that it never helped her in the past.      Chronic right-sided low back  pain with sciatica    DDD/ Facet arthritis Nurse spent 15 minutes helping pt complete [facility] transportation forms.  Tramadol sent to close to pharmacy for patient.  Zanaflex for spasm.  Referral to orthopedics.      Relevant Medications   traMADol (ULTRAM) 50 MG tablet   tiZANidine (ZANAFLEX) 4 MG tablet   Other Relevant Orders   Ambulatory referral to Orthopedic Surgery   Essential hypertension - Primary    Controlled, no changes to meds Pt agrees to get labs done at another lab station      Relevant Orders   CBC with Differential/Platelet   Comprehensive metabolic panel   Lipid panel   TSH   Insomnia    Other Visit Diagnoses    DDD (degenerative disc disease), lumbar       Relevant Medications   traMADol  (ULTRAM) 50 MG tablet   tiZANidine (ZANAFLEX) 4 MG tablet   Other Relevant Orders   Ambulatory referral to Orthopedic Surgery   Facet arthritis of lumbar region       Relevant Medications   traMADol (ULTRAM) 50 MG tablet   tiZANidine (ZANAFLEX) 4 MG tablet   Other Relevant Orders   Ambulatory referral to Orthopedic Surgery      Note: This dictation was prepared with Dragon dictation along with smaller phrase technology. Any transcriptional errors that result from this process are unintentional.

## 2020-08-14 NOTE — Patient Instructions (Signed)
Referral to orthopedics Use muscle relaxer and pain medicine Get your labs done at   [facility] , Monday through Friday 7am-5pm

## 2020-08-14 NOTE — Assessment & Plan Note (Signed)
Controlled, no changes to meds Pt agrees to get labs done at another lab station

## 2020-08-14 NOTE — Assessment & Plan Note (Addendum)
DDD/ Facet arthritis Nurse spent 15 minutes helping pt complete [facility] transportation forms.  Tramadol sent to close to pharmacy for patient.  Zanaflex for spasm.  Referral to orthopedics.

## 2020-08-19 ENCOUNTER — Telehealth: Payer: Self-pay | Admitting: Family Medicine

## 2020-08-19 LAB — CBC WITH DIFFERENTIAL/PLATELET
Absolute Monocytes: 554 cells/uL (ref 200–950)
Basophils Absolute: 70 cells/uL (ref 0–200)
Basophils Relative: 0.9 %
Eosinophils Absolute: 70 cells/uL (ref 15–500)
Eosinophils Relative: 0.9 %
HCT: 37.2 % (ref 35.0–45.0)
Hemoglobin: 12.7 g/dL (ref 11.7–15.5)
Lymphs Abs: 1318 cells/uL (ref 850–3900)
MCH: 31.7 pg (ref 27.0–33.0)
MCHC: 34.1 g/dL (ref 32.0–36.0)
MCV: 92.8 fL (ref 80.0–100.0)
MPV: 10 fL (ref 7.5–12.5)
Monocytes Relative: 7.1 %
Neutro Abs: 5788 cells/uL (ref 1500–7800)
Neutrophils Relative %: 74.2 %
Platelets: 276 10*3/uL (ref 140–400)
RBC: 4.01 10*6/uL (ref 3.80–5.10)
RDW: 12 % (ref 11.0–15.0)
Total Lymphocyte: 16.9 %
WBC: 7.8 10*3/uL (ref 3.8–10.8)

## 2020-08-19 LAB — COMPREHENSIVE METABOLIC PANEL
AG Ratio: 2.2 (calc) (ref 1.0–2.5)
ALT: 9 U/L (ref 6–29)
AST: 12 U/L (ref 10–35)
Albumin: 4.3 g/dL (ref 3.6–5.1)
Alkaline phosphatase (APISO): 56 U/L (ref 37–153)
BUN: 14 mg/dL (ref 7–25)
CO2: 24 mmol/L (ref 20–32)
Calcium: 9.6 mg/dL (ref 8.6–10.4)
Chloride: 102 mmol/L (ref 98–110)
Creat: 0.8 mg/dL (ref 0.50–0.99)
Globulin: 2 g/dL (calc) (ref 1.9–3.7)
Glucose, Bld: 92 mg/dL (ref 65–99)
Potassium: 4.6 mmol/L (ref 3.5–5.3)
Sodium: 136 mmol/L (ref 135–146)
Total Bilirubin: 0.3 mg/dL (ref 0.2–1.2)
Total Protein: 6.3 g/dL (ref 6.1–8.1)

## 2020-08-19 LAB — LIPID PANEL
Cholesterol: 180 mg/dL (ref <200)
HDL: 66 mg/dL (ref >=50)
LDL Cholesterol (Calc): 84 mg/dL (calc)
Non-HDL Cholesterol (Calc): 114 mg/dL (calc) (ref <130)
Total CHOL/HDL Ratio: 2.7 (calc) (ref <5.0)
Triglycerides: 199 mg/dL — ABNORMAL HIGH (ref <150)

## 2020-08-19 LAB — TSH: TSH: 2.46 mIU/L (ref 0.40–4.50)

## 2020-08-19 NOTE — Telephone Encounter (Signed)
Pt called stating that she has a referral from Dr. Buelah Manis for a back dr. Her insurance [employer] is not accepted there. She is asking that she be referred to another dr that accepts this insurance.   Cb#: [phone]

## 2020-08-19 NOTE — Telephone Encounter (Signed)
Sent message to Tracey Saunders to assist pt with another referral for ortho surgeon

## 2020-08-20 ENCOUNTER — Encounter: Payer: Self-pay | Admitting: *Deleted

## 2020-09-07 ENCOUNTER — Other Ambulatory Visit: Payer: Self-pay | Admitting: *Deleted

## 2020-09-07 ENCOUNTER — Telehealth: Payer: Self-pay | Admitting: Family Medicine

## 2020-09-07 MED ORDER — MONTELUKAST SODIUM 10 MG PO TABS: ORAL_TABLET | ORAL | 0 refills | Status: DC

## 2020-09-07 MED ORDER — MONTELUKAST SODIUM 10 MG PO TABS: ORAL_TABLET | ORAL | 3 refills | Status: DC

## 2020-09-07 MED ORDER — OMEPRAZOLE 40 MG PO CPDR: DELAYED_RELEASE_CAPSULE | ORAL | 3 refills | Status: DC

## 2020-09-07 MED ORDER — ATORVASTATIN CALCIUM 20 MG PO TABS: 20.0000 mg | ORAL_TABLET | Freq: Every day | ORAL | 0 refills | Status: DC

## 2020-09-07 MED ORDER — ATORVASTATIN CALCIUM 20 MG PO TABS: 20.0000 mg | ORAL_TABLET | Freq: Every day | ORAL | 3 refills | Status: DC

## 2020-09-07 NOTE — Telephone Encounter (Signed)
omeprazole (PRILOSEC) 40 MG capsule  Received a fax from [facility] requesting refill.

## 2020-09-07 NOTE — Telephone Encounter (Signed)
Lipitor tab 20 mg   Singular tab 100 mg

## 2020-09-08 ENCOUNTER — Ambulatory Visit: Payer: Medicare (Managed Care) | Admitting: Family Medicine

## 2020-09-16 ENCOUNTER — Encounter: Payer: Self-pay | Admitting: Family Medicine

## 2020-09-16 ENCOUNTER — Ambulatory Visit: Payer: Medicare (Managed Care) | Admitting: Family Medicine

## 2020-09-16 ENCOUNTER — Other Ambulatory Visit: Payer: Self-pay

## 2020-09-16 VITALS — BP 128/64 | HR 84 | Temp 98.9°F | Resp 14 | Ht 61.0 in | Wt 131.0 lb

## 2020-09-16 DIAGNOSIS — M5441 Lumbago with sciatica, right side: Secondary | ICD-10-CM

## 2020-09-16 DIAGNOSIS — F3342 Major depressive disorder, recurrent, in full remission: Secondary | ICD-10-CM

## 2020-09-16 DIAGNOSIS — M5136 Other intervertebral disc degeneration, lumbar region: Secondary | ICD-10-CM

## 2020-09-16 DIAGNOSIS — F419 Anxiety disorder, unspecified: Secondary | ICD-10-CM | POA: Diagnosis not present

## 2020-09-16 DIAGNOSIS — E785 Hyperlipidemia, unspecified: Secondary | ICD-10-CM | POA: Diagnosis not present

## 2020-09-16 DIAGNOSIS — I10 Essential (primary) hypertension: Secondary | ICD-10-CM

## 2020-09-16 DIAGNOSIS — R002 Palpitations: Secondary | ICD-10-CM

## 2020-09-16 DIAGNOSIS — G8929 Other chronic pain: Secondary | ICD-10-CM

## 2020-09-16 MED ORDER — TRAZODONE HCL 50 MG PO TABS: 100.0000 mg | ORAL_TABLET | Freq: Every evening | ORAL | 1 refills | Status: DC | PRN

## 2020-09-16 MED ORDER — LOSARTAN POTASSIUM 50 MG PO TABS: 25.0000 mg | ORAL_TABLET | Freq: Every day | ORAL | 1 refills | Status: DC

## 2020-09-16 MED ORDER — LORATADINE 10 MG PO TABS: 10.0000 mg | ORAL_TABLET | Freq: Every day | ORAL | 1 refills | Status: DC

## 2020-09-16 MED ORDER — MONTELUKAST SODIUM 10 MG PO TABS: ORAL_TABLET | ORAL | 3 refills | Status: DC

## 2020-09-16 MED ORDER — AMLODIPINE BESYLATE 5 MG PO TABS: 5.0000 mg | ORAL_TABLET | Freq: Every day | ORAL | 1 refills | Status: DC

## 2020-09-16 MED ORDER — TIZANIDINE HCL 4 MG PO TABS: 4.0000 mg | ORAL_TABLET | Freq: Four times a day (QID) | ORAL | 0 refills | Status: DC | PRN

## 2020-09-16 MED ORDER — ATORVASTATIN CALCIUM 20 MG PO TABS
20.0000 mg | ORAL_TABLET | Freq: Every day | ORAL | 1 refills | Status: DC
Start: 2020-09-16 — End: 2021-06-17

## 2020-09-16 MED ORDER — OMEPRAZOLE 40 MG PO CPDR: DELAYED_RELEASE_CAPSULE | ORAL | 3 refills | Status: DC

## 2020-09-16 MED ORDER — SERTRALINE HCL 50 MG PO TABS: 75.0000 mg | ORAL_TABLET | Freq: Every day | ORAL | 3 refills | Status: DC

## 2020-09-16 MED ORDER — TRAMADOL HCL 50 MG PO TABS: ORAL_TABLET | ORAL | 1 refills | Status: DC

## 2020-09-16 NOTE — Assessment & Plan Note (Signed)
Patient having some interesting symptoms with "dizziness but not describing any type of vertigo.  Also has had intermittent palpitations which is been going on for quite some time but has underlying anxiety which I think is making things worse.  She often switches different topics and is very overwhelmed with her back pain.  She does have some family history of heart issues.  With the palpitations I do not hear anything today at Her previous EKG I think this warrants follow-up event monitor.  Her recent labs are unremarkable.  She will be referred to cardiology.  The meantime she will continue Zoloft 75 mg which she did recently restart

## 2020-09-16 NOTE — Assessment & Plan Note (Signed)
Discussed taking lipitor

## 2020-09-16 NOTE — Assessment & Plan Note (Signed)
Referral to new ortho under insurance Refilled zanaflex and ultram

## 2020-09-16 NOTE — Progress Notes (Signed)
Subjective:    Patient ID: Tracey Saunders, female    DOB: [birth year], 65 y.o.   MRN: [id number]  Patient presents for Follow-up (Is fasting), Vertigo (X3 weeks- dizziness with no know cause/), and Back Pain  Pt here for intermin follow up At the last visit  HTN- taking bp meds as prescribed   Chronic back pain , she was referred to orthopedics for chronic issues , she was not sleeping well due to pain . Pain is getting worse with ambulating, difficulty shopping for grioceries   she was prescribed ultram and zanaflex Needs referral to Dr. Onnie Graham- [facility] ,the previous referral did not take her insurance   HLD- fasting labs were done, she was prescribed lipitor 20mg  and she stopped taking because a note from someone?  , LDL had improved to 84, but TG still high at 199, discussed diet    GAD - she is on zoloft 75mg  once a day, trazodone 100mg  at bedtime prn, she declines psychiatry evaluation  She was out of her medication for a week and had worsening anxiety , now feels better  She has had dizzy spells along with ongoing palpitations.  EKG back in December we discussed cardiology follow-up but she went home at that time.  I think her anxiety is also tied to her spells.  When she complains of dizziness states that she feels like she is falling forward at times.  She not had any falls.  She also states that her heart races at times.    Review Of Systems:  GEN- denies fatigue, fever, weight loss,weakness, recent illness HEENT- denies eye drainage, change in vision, nasal discharge, CVS- denies chest pain,+ palpitations RESP- denies SOB, cough, wheeze ABD- denies N/V, change in stools, abd pain GU- denies dysuria, hematuria, dribbling, incontinence MSK- + joint pain, muscle aches, injury Neuro- denies headache, dizziness, syncope, seizure activity       Objective:    BP 128/64   Pulse 84   Temp 98.9 F (37.2 C) (Temporal)   Resp 14   Ht 5\' 1"  (1.549 m)   Wt 131 lb  (59.4 kg)   SpO2 97%   BMI 24.75 kg/m  GEN- NAD, alert and oriented x3 HEENT- PERRL, EOMI, non injected sclera, pink conjunctiva, MMM, oropharynx clear Neck- Supple, no thyromegaly CVS- RRR, no murmur RESP-CTAB ABD-NABS,soft,NT,ND Psych- anxious appearing, not depressed, no SI, well groomed, good eye contact  EXT- No edema Pulses- Radial, DP- 2+ NEURO-CNII-XII grossly in tact no focal deficits    EKG NSR, poor r wave progression, compared to 2020     Assessment & Plan:      Problem List Items Addressed This Visit      Unprioritized   Anxiety disorder - Primary    Patient having some interesting symptoms with "dizziness but not describing any type of vertigo.  Also has had intermittent palpitations which is been going on for quite some time but has underlying anxiety which I think is making things worse.  She often switches different topics and is very overwhelmed with her back pain.  She does have some family history of heart issues.  With the palpitations I do not hear anything today at Her previous EKG I think this warrants follow-up event monitor.  Her recent labs are unremarkable.  She will be referred to cardiology.  The meantime she will continue Zoloft 75 mg which she did recently restart      Relevant Medications   traZODone (DESYREL) 50 MG tablet  sertraline (ZOLOFT) 50 MG tablet   Chronic right-sided low back pain with sciatica    Referral to new ortho under insurance Refilled zanaflex and ultram      Relevant Medications   traZODone (DESYREL) 50 MG tablet   sertraline (ZOLOFT) 50 MG tablet   tiZANidine (ZANAFLEX) 4 MG tablet   traMADol (ULTRAM) 50 MG tablet   Essential hypertension    Controlled no changes       Relevant Medications   atorvastatin (LIPITOR) 20 MG tablet   amLODipine (NORVASC) 5 MG tablet   losartan (COZAAR) 50 MG tablet   Hyperlipidemia    Discussed taking lipitor       Relevant Medications   atorvastatin (LIPITOR) 20 MG tablet    amLODipine (NORVASC) 5 MG tablet   losartan (COZAAR) 50 MG tablet   Recurrent major depressive disorder, in full remission (HCC)   Relevant Medications   traZODone (DESYREL) 50 MG tablet   sertraline (ZOLOFT) 50 MG tablet    Other Visit Diagnoses    DDD (degenerative disc disease), lumbar       Relevant Medications   tiZANidine (ZANAFLEX) 4 MG tablet   traMADol (ULTRAM) 50 MG tablet       Note: This dictation was prepared with Dragon dictation along with smaller phrase technology. Any transcriptional errors that result from this process are unintentional.

## 2020-09-16 NOTE — Patient Instructions (Addendum)
Referral Cardiology Referral to Dr. Augustin Coupe Office  Start the lipitor  Work on finding a new Primary Care

## 2020-09-16 NOTE — Assessment & Plan Note (Signed)
Controlled no changes 

## 2020-10-08 ENCOUNTER — Telehealth: Payer: Self-pay | Admitting: Family Medicine

## 2020-10-08 MED ORDER — SERTRALINE HCL 50 MG PO TABS: 75.0000 mg | ORAL_TABLET | Freq: Every day | ORAL | 0 refills | Status: DC

## 2020-10-08 NOTE — Telephone Encounter (Signed)
Patient left voicemail asking for the assistant who worked with Dr. Buelah Manis to call her for a med refill to avoid a lapse in doses; medication not specified. Please advise at [phone]

## 2020-10-08 NOTE — Telephone Encounter (Signed)
Received another call from patient and requested refill on Zoloft.   Prescription sent to pharmacy.

## 2020-10-13 ENCOUNTER — Telehealth: Payer: Self-pay

## 2020-10-13 NOTE — Telephone Encounter (Signed)
Patient received a bill and doesn't know what it's for; asked for callback from [facility]. Please advise at [phone].

## 2020-10-15 NOTE — Telephone Encounter (Signed)
I spoke with patient and advised her that the reason she has received another bill after her appt on 09/22/2020 she hadn't contacted her insurance company.  I have instructed her to call her insurance company and ask if they would consider paying it since she states she was unaware that she had to have her PCP on the card. I have also advised her that when she sets up with a new PCP she will need to contact them again and change the name to the new PCP.   Patient verbalized understanding and states she will contact her insurance company.

## 2020-11-13 DIAGNOSIS — M85852 Other specified disorders of bone density and structure, left thigh: Secondary | ICD-10-CM | POA: Diagnosis not present

## 2020-11-13 DIAGNOSIS — Z1231 Encounter for screening mammogram for malignant neoplasm of breast: Secondary | ICD-10-CM | POA: Diagnosis not present

## 2020-11-13 DIAGNOSIS — M810 Age-related osteoporosis without current pathological fracture: Secondary | ICD-10-CM | POA: Diagnosis not present

## 2020-11-20 DIAGNOSIS — M5459 Other low back pain: Secondary | ICD-10-CM | POA: Diagnosis not present

## 2020-11-20 DIAGNOSIS — M5136 Other intervertebral disc degeneration, lumbar region: Secondary | ICD-10-CM | POA: Diagnosis not present

## 2020-11-20 DIAGNOSIS — M48061 Spinal stenosis, lumbar region without neurogenic claudication: Secondary | ICD-10-CM | POA: Diagnosis not present

## 2020-11-23 DIAGNOSIS — J301 Allergic rhinitis due to pollen: Secondary | ICD-10-CM | POA: Diagnosis not present

## 2020-11-23 DIAGNOSIS — R69 Illness, unspecified: Secondary | ICD-10-CM | POA: Diagnosis not present

## 2020-11-23 DIAGNOSIS — J441 Chronic obstructive pulmonary disease with (acute) exacerbation: Secondary | ICD-10-CM | POA: Diagnosis not present

## 2020-11-23 DIAGNOSIS — R2681 Unsteadiness on feet: Secondary | ICD-10-CM | POA: Diagnosis not present

## 2020-11-23 DIAGNOSIS — I10 Essential (primary) hypertension: Secondary | ICD-10-CM | POA: Diagnosis not present

## 2020-11-23 DIAGNOSIS — G894 Chronic pain syndrome: Secondary | ICD-10-CM | POA: Diagnosis not present

## 2020-12-15 DIAGNOSIS — I10 Essential (primary) hypertension: Secondary | ICD-10-CM | POA: Diagnosis not present

## 2020-12-15 DIAGNOSIS — I2510 Atherosclerotic heart disease of native coronary artery without angina pectoris: Secondary | ICD-10-CM | POA: Diagnosis not present

## 2020-12-15 DIAGNOSIS — J309 Allergic rhinitis, unspecified: Secondary | ICD-10-CM | POA: Diagnosis not present

## 2020-12-15 DIAGNOSIS — J45909 Unspecified asthma, uncomplicated: Secondary | ICD-10-CM | POA: Diagnosis not present

## 2020-12-23 ENCOUNTER — Telehealth: Payer: Self-pay | Admitting: Family Medicine

## 2020-12-23 NOTE — Telephone Encounter (Signed)
Pt called wanted to speak with you about a bill that she says should've have been taken care of. She would like for you to call her asap. Pt stated that she has been trying to reach you for weeks.   Cb#: [phone]

## 2020-12-31 NOTE — Telephone Encounter (Signed)
I spoke with the patient. I had advised her again that she does owe the bill per her insurance company. She wasn't happy but she understands that I have no way of taken care of the bill since it is her insurance stating she owes.

## 2021-01-04 DIAGNOSIS — E782 Mixed hyperlipidemia: Secondary | ICD-10-CM | POA: Diagnosis not present

## 2021-01-04 DIAGNOSIS — I10 Essential (primary) hypertension: Secondary | ICD-10-CM | POA: Diagnosis not present

## 2021-01-04 DIAGNOSIS — G894 Chronic pain syndrome: Secondary | ICD-10-CM | POA: Diagnosis not present

## 2021-01-04 DIAGNOSIS — J441 Chronic obstructive pulmonary disease with (acute) exacerbation: Secondary | ICD-10-CM | POA: Diagnosis not present

## 2021-01-04 DIAGNOSIS — R69 Illness, unspecified: Secondary | ICD-10-CM | POA: Diagnosis not present

## 2021-01-06 DIAGNOSIS — M5136 Other intervertebral disc degeneration, lumbar region: Secondary | ICD-10-CM | POA: Diagnosis not present

## 2021-01-27 DIAGNOSIS — M545 Low back pain, unspecified: Secondary | ICD-10-CM | POA: Diagnosis not present

## 2021-02-04 DIAGNOSIS — J441 Chronic obstructive pulmonary disease with (acute) exacerbation: Secondary | ICD-10-CM | POA: Diagnosis not present

## 2021-02-04 DIAGNOSIS — G894 Chronic pain syndrome: Secondary | ICD-10-CM | POA: Diagnosis not present

## 2021-02-04 DIAGNOSIS — J301 Allergic rhinitis due to pollen: Secondary | ICD-10-CM | POA: Diagnosis not present

## 2021-02-04 DIAGNOSIS — I10 Essential (primary) hypertension: Secondary | ICD-10-CM | POA: Diagnosis not present

## 2021-02-04 DIAGNOSIS — M545 Low back pain, unspecified: Secondary | ICD-10-CM | POA: Diagnosis not present

## 2021-02-04 DIAGNOSIS — E782 Mixed hyperlipidemia: Secondary | ICD-10-CM | POA: Diagnosis not present

## 2021-02-04 DIAGNOSIS — R69 Illness, unspecified: Secondary | ICD-10-CM | POA: Diagnosis not present

## 2021-02-11 DIAGNOSIS — M79672 Pain in left foot: Secondary | ICD-10-CM | POA: Diagnosis not present

## 2021-02-11 DIAGNOSIS — M48061 Spinal stenosis, lumbar region without neurogenic claudication: Secondary | ICD-10-CM | POA: Diagnosis not present

## 2021-02-11 DIAGNOSIS — M5136 Other intervertebral disc degeneration, lumbar region: Secondary | ICD-10-CM | POA: Diagnosis not present

## 2021-02-11 DIAGNOSIS — M545 Low back pain, unspecified: Secondary | ICD-10-CM | POA: Diagnosis not present

## 2021-02-25 DIAGNOSIS — Z0000 Encounter for general adult medical examination without abnormal findings: Secondary | ICD-10-CM | POA: Diagnosis not present

## 2021-02-25 DIAGNOSIS — E782 Mixed hyperlipidemia: Secondary | ICD-10-CM | POA: Diagnosis not present

## 2021-02-25 DIAGNOSIS — R69 Illness, unspecified: Secondary | ICD-10-CM | POA: Diagnosis not present

## 2021-02-25 DIAGNOSIS — G894 Chronic pain syndrome: Secondary | ICD-10-CM | POA: Diagnosis not present

## 2021-02-25 DIAGNOSIS — M545 Low back pain, unspecified: Secondary | ICD-10-CM | POA: Diagnosis not present

## 2021-02-25 DIAGNOSIS — J441 Chronic obstructive pulmonary disease with (acute) exacerbation: Secondary | ICD-10-CM | POA: Diagnosis not present

## 2021-02-25 DIAGNOSIS — Z9989 Dependence on other enabling machines and devices: Secondary | ICD-10-CM | POA: Diagnosis not present

## 2021-02-25 DIAGNOSIS — I10 Essential (primary) hypertension: Secondary | ICD-10-CM | POA: Diagnosis not present

## 2021-03-26 DIAGNOSIS — M5416 Radiculopathy, lumbar region: Secondary | ICD-10-CM | POA: Diagnosis not present

## 2021-05-05 ENCOUNTER — Ambulatory Visit (INDEPENDENT_AMBULATORY_CARE_PROVIDER_SITE_OTHER): Payer: Medicare HMO | Admitting: Physician Assistant

## 2021-05-05 ENCOUNTER — Encounter: Payer: Self-pay | Admitting: Physician Assistant

## 2021-05-05 ENCOUNTER — Other Ambulatory Visit: Payer: Self-pay

## 2021-05-05 VITALS — BP 118/80 | HR 66 | Temp 97.3°F | Ht 61.0 in | Wt 144.2 lb

## 2021-05-05 DIAGNOSIS — I10 Essential (primary) hypertension: Secondary | ICD-10-CM | POA: Diagnosis not present

## 2021-05-05 DIAGNOSIS — M5440 Lumbago with sciatica, unspecified side: Secondary | ICD-10-CM

## 2021-05-05 DIAGNOSIS — Z8719 Personal history of other diseases of the digestive system: Secondary | ICD-10-CM

## 2021-05-05 DIAGNOSIS — F3342 Major depressive disorder, recurrent, in full remission: Secondary | ICD-10-CM | POA: Diagnosis not present

## 2021-05-05 DIAGNOSIS — R140 Abdominal distension (gaseous): Secondary | ICD-10-CM | POA: Diagnosis not present

## 2021-05-05 DIAGNOSIS — R1010 Upper abdominal pain, unspecified: Secondary | ICD-10-CM

## 2021-05-05 DIAGNOSIS — R600 Localized edema: Secondary | ICD-10-CM | POA: Diagnosis not present

## 2021-05-05 DIAGNOSIS — K219 Gastro-esophageal reflux disease without esophagitis: Secondary | ICD-10-CM

## 2021-05-05 DIAGNOSIS — R69 Illness, unspecified: Secondary | ICD-10-CM | POA: Diagnosis not present

## 2021-05-05 DIAGNOSIS — G8929 Other chronic pain: Secondary | ICD-10-CM

## 2021-05-05 DIAGNOSIS — E785 Hyperlipidemia, unspecified: Secondary | ICD-10-CM | POA: Diagnosis not present

## 2021-05-05 DIAGNOSIS — F419 Anxiety disorder, unspecified: Secondary | ICD-10-CM | POA: Diagnosis not present

## 2021-05-05 LAB — CBC WITH DIFFERENTIAL/PLATELET
Basophils Absolute: 0 10*3/uL (ref 0.0–0.1)
Basophils Relative: 0.6 % (ref 0.0–3.0)
Eosinophils Absolute: 0.1 10*3/uL (ref 0.0–0.7)
Eosinophils Relative: 0.8 % (ref 0.0–5.0)
HCT: 35.5 % — ABNORMAL LOW (ref 36.0–46.0)
Hemoglobin: 11.9 g/dL — ABNORMAL LOW (ref 12.0–15.0)
Lymphocytes Relative: 19 % (ref 12.0–46.0)
Lymphs Abs: 1.5 10*3/uL (ref 0.7–4.0)
MCHC: 33.5 g/dL (ref 30.0–36.0)
MCV: 93.4 fl (ref 78.0–100.0)
Monocytes Absolute: 0.6 10*3/uL (ref 0.1–1.0)
Monocytes Relative: 7.2 % (ref 3.0–12.0)
Neutro Abs: 5.8 10*3/uL (ref 1.4–7.7)
Neutrophils Relative %: 72.4 % (ref 43.0–77.0)
Platelets: 260 10*3/uL (ref 150.0–400.0)
RBC: 3.8 Mil/uL — ABNORMAL LOW (ref 3.87–5.11)
RDW: 12 % (ref 11.5–15.5)
WBC: 8 10*3/uL (ref 4.0–10.5)

## 2021-05-05 LAB — COMPREHENSIVE METABOLIC PANEL
ALT: 10 U/L (ref 0–35)
AST: 14 U/L (ref 0–37)
Albumin: 4.5 g/dL (ref 3.5–5.2)
Alkaline Phosphatase: 56 U/L (ref 39–117)
BUN: 8 mg/dL (ref 6–23)
CO2: 27 mEq/L (ref 19–32)
Calcium: 9.1 mg/dL (ref 8.4–10.5)
Chloride: 97 mEq/L (ref 96–112)
Creatinine, Ser: 0.76 mg/dL (ref 0.40–1.20)
GFR: 81.35 mL/min (ref >60.00)
Glucose, Bld: 98 mg/dL (ref 70–99)
Potassium: 5 mEq/L (ref 3.5–5.1)
Sodium: 132 mEq/L — ABNORMAL LOW (ref 135–145)
Total Bilirubin: 0.4 mg/dL (ref 0.2–1.2)
Total Protein: 6.6 g/dL (ref 6.0–8.3)

## 2021-05-05 LAB — LIPASE: Lipase: 25 U/L (ref 11.0–59.0)

## 2021-05-05 LAB — LIPID PANEL
Cholesterol: 165 mg/dL (ref 0–200)
HDL: 67.4 mg/dL (ref >39.00)
LDL Cholesterol: 82 mg/dL (ref 0–99)
NonHDL: 97.72
Total CHOL/HDL Ratio: 2
Triglycerides: 80 mg/dL (ref 0.0–149.0)
VLDL: 16 mg/dL (ref 0.0–40.0)

## 2021-05-05 MED ORDER — PANTOPRAZOLE SODIUM 20 MG PO TBEC: 20.0000 mg | DELAYED_RELEASE_TABLET | Freq: Every day | ORAL | 2 refills | Status: DC

## 2021-05-05 MED ORDER — HYDROCHLOROTHIAZIDE 12.5 MG PO TABS: 12.5000 mg | ORAL_TABLET | Freq: Every day | ORAL | 2 refills | Status: DC

## 2021-05-05 MED ORDER — DICYCLOMINE HCL 10 MG PO CAPS: 10.0000 mg | ORAL_CAPSULE | Freq: Three times a day (TID) | ORAL | 0 refills | Status: DC

## 2021-05-05 NOTE — Progress Notes (Signed)
Subjective:    Patient ID: Tracey Saunders, female    DOB: 1956/05/20, 65 y.o.   MRN: [id number]  Chief Complaint  Patient presents with   [place] chair    HPI Patient is in today for new patient establishment. States she hasn't had primary care in over a year. She was seeing a provider that she says insisted she take Myrbetriq, but says she couldn't afford $100 / month.   She says she is very stressed. When her stress worsens, her GI issues worsen. States that Prilosec 40 mg "isn't touching" her acid reflux that she has all day long. She is very concerned about her chronic abdominal pain, which she has previously seen Dr. Henrene Pastor for. Cramping diffuse abdominal pain.  She is also concerned about swelling in her lower extremities, ongoing. She does take Norvasc 5 mg daily for BP, as well as losartan 50 mg.   Current specialists  - Dr. Nelva Bush and Dr. Tonita Cong for back injections.    Past Medical History:  Diagnosis Date   Anxiety disorder 09/09/2019   Cancer [facility])    cervical cancer   Depression    Diverticulitis    Fatty liver    GERD (gastroesophageal reflux disease)    Hyperlipidemia    Hypertension    Insomnia 09/09/2019   Liver cyst    Vitamin D deficiency     Past Surgical History:  Procedure Laterality Date   ABDOMINAL HYSTERECTOMY     COLON RESECTION     due to diverticulitis    Family History  Problem Relation Age of Onset   Colon cancer Neg Hx    Esophageal cancer Neg Hx    Stomach cancer Neg Hx    Rectal cancer Neg Hx     Social History   Tobacco Use   Smoking status: Light Smoker    Packs/day: 0.50    Types: Cigarettes   Smokeless tobacco: Never  Vaping Use   Vaping Use: Never used  Substance Use Topics   Alcohol use: Yes    Comment: occasional beer or bloody mary once a week   Drug use: Never     No Known Allergies  Review of Systems REFER TO HPI FOR PERTINENT POSITIVES AND NEGATIVES      Objective:     BP 118/80   Pulse  66   Temp (!) 97.3 F (36.3 C)   Ht 5\' 1"  (1.549 m)   Wt 144 lb 4 oz (65.4 kg)   SpO2 98%   BMI 27.26 kg/m   Wt Readings from Last 3 Encounters:  05/05/21 144 lb 4 oz (65.4 kg)  09/16/20 131 lb (59.4 kg)  08/14/20 131 lb (59.4 kg)    BP Readings from Last 3 Encounters:  05/05/21 118/80  09/16/20 128/64  08/14/20 130/74     Physical Exam Vitals and nursing note reviewed.  Constitutional:      Appearance: Normal appearance. She is normal weight. She is not toxic-appearing.  HENT:     Head: Normocephalic and atraumatic.     Right Ear: External ear normal.     Left Ear: External ear normal.     Nose: Nose normal.     Mouth/Throat:     Mouth: Mucous membranes are moist.  Eyes:     Extraocular Movements: Extraocular movements intact.     Conjunctiva/sclera: Conjunctivae normal.     Pupils: Pupils are equal, round, and reactive to light.  Cardiovascular:  Rate and Rhythm: Normal rate and regular rhythm.     Pulses: Normal pulses.     Heart sounds: Normal heart sounds.  Pulmonary:     Effort: Pulmonary effort is normal.     Breath sounds: Normal breath sounds.  Abdominal:     General: Abdomen is flat. Bowel sounds are normal.     Palpations: Abdomen is soft.     Tenderness: There is abdominal tenderness (diffuse).  Musculoskeletal:        General: Normal range of motion.     Cervical back: Normal range of motion and neck supple.     Right lower leg: Edema present.     Left lower leg: Edema present.  Skin:    General: Skin is warm and dry.  Neurological:     General: No focal deficit present.     Mental Status: She is alert and oriented to person, place, and time.  Psychiatric:     Comments: Blunt, irritable        Assessment & Plan:   Problem List Items Addressed This Visit       Cardiovascular and Mediastinum   Essential hypertension - Primary   Relevant Medications   hydrochlorothiazide (HYDRODIURIL) 12.5 MG tablet     Digestive   Gastroesophageal  reflux disease   Relevant Medications   pantoprazole (PROTONIX) 20 MG tablet   dicyclomine (BENTYL) 10 MG capsule   Other Relevant Orders   CBC with Differential/Platelet (Completed)   Comprehensive metabolic panel (Completed)   Lipid panel (Completed)   Lipase (Completed)     Nervous and Auditory   Chronic right-sided low back pain with sciatica     Other   Hyperlipidemia   Relevant Medications   hydrochlorothiazide (HYDRODIURIL) 12.5 MG tablet   Other Relevant Orders   Lipid panel (Completed)   Anxiety disorder   Recurrent major depressive disorder, in full remission ([place])   Other Visit Diagnoses     History of diverticulitis       Relevant Orders   CBC with Differential/Platelet (Completed)   Comprehensive metabolic panel (Completed)   H Pylori, IGM, IGG, IGA AB (Completed)   Pain of upper abdomen       Relevant Orders   CBC with Differential/Platelet (Completed)   Comprehensive metabolic panel (Completed)   Lipase (Completed)   H Pylori, IGM, IGG, IGA AB (Completed)   Abdominal bloating       Relevant Orders   CBC with Differential/Platelet (Completed)   Comprehensive metabolic panel (Completed)   Lipase (Completed)   H Pylori, IGM, IGG, IGA AB (Completed)   Localized edema            Meds ordered this encounter  Medications   hydrochlorothiazide (HYDRODIURIL) 12.5 MG tablet    Sig: Take 1 tablet (12.5 mg total) by mouth daily.    Dispense:  30 tablet    Refill:  2   pantoprazole (PROTONIX) 20 MG tablet    Sig: Take 1 tablet (20 mg total) by mouth daily.    Dispense:  30 tablet    Refill:  2   dicyclomine (BENTYL) 10 MG capsule    Sig: Take 1 capsule (10 mg total) by mouth 4 (four) times daily -  before meals and at bedtime.    Dispense:  120 capsule    Refill:  0   1. Essential hypertension Her blood pressure is to goal on losartan 50 mg daily & amlodipine 5 mg daily.  However with the complaint of  swelling in her extremities, I discussed with her  that this might be a side effect of the amlodipine.  I suggested that she stop this blood pressure medication and try hydrochlorothiazide 12.5 mg instead.  I also recommended that she monitor her blood pressure at home and stick to low-salt diet.  2. Anxiety disorder, unspecified type 3. Recurrent major depressive disorder, in full remission ([place]) She is obviously very stressed and irritable.  We did not get into too much discussion about this today, but she has been taking Zoloft 75 mg daily.  4. Hyperlipidemia, unspecified hyperlipidemia type She is taking Lipitor 20 mg daily.  Will check lipid panel. The 10-year ASCVD risk score (Arnett DK, et al., 2019) is: 10.6%   5. Gastroesophageal reflux disease without esophagitis 6. History of diverticulitis 7. Pain of upper abdomen 8. Abdominal bloating It looks like she has a history of IBS and has previously seen Dr. Henrene Pastor.  This seems to be her biggest concern.  I suggested to check labs to see if anything has changed.  I also offered a trial of Bentyl 10 mg 3 times daily and to change her acid reflux medicine to Protonix 20 mg daily.  Ultimately, she will probably need to get back in with GI for chronic management of her issues.  9. Chronic right-sided low back pain with sciatica, sciatica laterality unspecified Her pain seems to be stable and she is following up with her specialist on this.  10. Localized edema Equal in bilateral extremities.  Again, I suggested that she stop Norvasc 5 mg and see if this helps alleviate her swelling.   This note was prepared with assistance of Systems analyst. Occasional wrong-word or sound-a-like substitutions may have occurred due to the inherent limitations of voice recognition software.  Time Spent: 36 minutes of total time was spent on the date of the encounter performing the following actions: chart review prior to seeing the patient, obtaining history, performing a medically  necessary exam, counseling on the treatment plan, placing orders, and documenting in our EHR.    Carmell Elgin M Cory Kitt, PA-C

## 2021-05-05 NOTE — Patient Instructions (Signed)
Good to meet you today! Please go to the lab for blood work and I will call with results.  Pending your blood work, will guide our treatment plan.

## 2021-05-07 ENCOUNTER — Telehealth: Payer: Self-pay

## 2021-05-07 NOTE — Telephone Encounter (Signed)
Pt called for lab results. Please Advise.  

## 2021-05-08 LAB — H PYLORI, IGM, IGG, IGA AB
H pylori, IgM Abs: <9 units (ref 0.0–8.9)
H. pylori, IgA Abs: <9 units (ref 0.0–8.9)
H. pylori, IgG AbS: 0.48 Index Value (ref 0.00–0.79)

## 2021-05-10 NOTE — Progress Notes (Signed)
Called patient and reported lab results. Pt stated she still has abdominal pain and she has other concerns including the need for shower chair. Informed pt that she will need to schedule a visit to discuss those concerns with a provider. Maddy will call pt and schedule OV.

## 2021-05-10 NOTE — Telephone Encounter (Signed)
Called pt back. See result note.

## 2021-05-10 NOTE — Telephone Encounter (Signed)
Pt called about lab results. Please Advise.

## 2021-05-17 ENCOUNTER — Ambulatory Visit: Payer: Medicare HMO | Admitting: Physician Assistant

## 2021-05-21 ENCOUNTER — Ambulatory Visit (INDEPENDENT_AMBULATORY_CARE_PROVIDER_SITE_OTHER): Payer: Medicare HMO | Admitting: Physician Assistant

## 2021-05-21 ENCOUNTER — Encounter: Payer: Self-pay | Admitting: Physician Assistant

## 2021-05-21 ENCOUNTER — Other Ambulatory Visit: Payer: Self-pay

## 2021-05-21 VITALS — BP 110/72 | HR 62 | Temp 97.8°F | Ht 61.0 in | Wt 142.2 lb

## 2021-05-21 DIAGNOSIS — E559 Vitamin D deficiency, unspecified: Secondary | ICD-10-CM | POA: Diagnosis not present

## 2021-05-21 DIAGNOSIS — I10 Essential (primary) hypertension: Secondary | ICD-10-CM | POA: Diagnosis not present

## 2021-05-21 DIAGNOSIS — E538 Deficiency of other specified B group vitamins: Secondary | ICD-10-CM

## 2021-05-21 DIAGNOSIS — M5440 Lumbago with sciatica, unspecified side: Secondary | ICD-10-CM | POA: Diagnosis not present

## 2021-05-21 DIAGNOSIS — M129 Arthropathy, unspecified: Secondary | ICD-10-CM | POA: Diagnosis not present

## 2021-05-21 DIAGNOSIS — G8929 Other chronic pain: Secondary | ICD-10-CM

## 2021-05-21 DIAGNOSIS — T783XXA Angioneurotic edema, initial encounter: Secondary | ICD-10-CM | POA: Diagnosis not present

## 2021-05-21 DIAGNOSIS — R5383 Other fatigue: Secondary | ICD-10-CM | POA: Diagnosis not present

## 2021-05-21 LAB — CBC WITH DIFFERENTIAL/PLATELET
Basophils Absolute: 0.1 10*3/uL (ref 0.0–0.1)
Basophils Relative: 0.6 % (ref 0.0–3.0)
Eosinophils Absolute: 0.1 10*3/uL (ref 0.0–0.7)
Eosinophils Relative: 1.3 % (ref 0.0–5.0)
HCT: 36.7 % (ref 36.0–46.0)
Hemoglobin: 12.1 g/dL (ref 12.0–15.0)
Lymphocytes Relative: 16 % (ref 12.0–46.0)
Lymphs Abs: 1.4 10*3/uL (ref 0.7–4.0)
MCHC: 32.9 g/dL (ref 30.0–36.0)
MCV: 93 fl (ref 78.0–100.0)
Monocytes Absolute: 0.5 10*3/uL (ref 0.1–1.0)
Monocytes Relative: 5.7 % (ref 3.0–12.0)
Neutro Abs: 6.8 10*3/uL (ref 1.4–7.7)
Neutrophils Relative %: 76.4 % (ref 43.0–77.0)
Platelets: 302 10*3/uL (ref 150.0–400.0)
RBC: 3.95 Mil/uL (ref 3.87–5.11)
RDW: 12.3 % (ref 11.5–15.5)
WBC: 8.9 10*3/uL (ref 4.0–10.5)

## 2021-05-21 LAB — COMPREHENSIVE METABOLIC PANEL
ALT: 14 U/L (ref 0–35)
AST: 16 U/L (ref 0–37)
Albumin: 4.4 g/dL (ref 3.5–5.2)
Alkaline Phosphatase: 51 U/L (ref 39–117)
BUN: 8 mg/dL (ref 6–23)
CO2: 28 mEq/L (ref 19–32)
Calcium: 9.2 mg/dL (ref 8.4–10.5)
Chloride: 94 mEq/L — ABNORMAL LOW (ref 96–112)
Creatinine, Ser: 0.82 mg/dL (ref 0.40–1.20)
GFR: 74.24 mL/min (ref >60.00)
Glucose, Bld: 83 mg/dL (ref 70–99)
Potassium: 3.7 mEq/L (ref 3.5–5.1)
Sodium: 131 mEq/L — ABNORMAL LOW (ref 135–145)
Total Bilirubin: 0.4 mg/dL (ref 0.2–1.2)
Total Protein: 6.8 g/dL (ref 6.0–8.3)

## 2021-05-21 LAB — URIC ACID: Uric Acid, Serum: 4.6 mg/dL (ref 2.4–7.0)

## 2021-05-21 LAB — SEDIMENTATION RATE: Sed Rate: 21 mm/hr (ref 0–30)

## 2021-05-21 LAB — VITAMIN D 25 HYDROXY (VIT D DEFICIENCY, FRACTURES): VITD: 37.96 ng/mL (ref 30.00–100.00)

## 2021-05-21 LAB — VITAMIN B12: Vitamin B-12: 203 pg/mL — ABNORMAL LOW (ref 211–911)

## 2021-05-21 MED ORDER — GABAPENTIN 300 MG PO CAPS: 300.0000 mg | ORAL_CAPSULE | Freq: Every day | ORAL | 0 refills | Status: DC

## 2021-05-21 MED ORDER — METHYLPREDNISOLONE 4 MG PO TBPK: ORAL_TABLET | ORAL | 0 refills | Status: DC

## 2021-05-21 NOTE — Progress Notes (Signed)
Subjective:    Patient ID: Tracey Saunders, female    DOB: 13-Apr-1956, 65 y.o.   MRN: [id number]  Chief Complaint  Patient presents with   Allergic Reaction    HPI Patient is in today for discussion on several items today.   Lower lip: Feels like it is on fire, woke up with swelling this morning.  She states this happened one time earlier this year as well and it resolved on its own.  She denies any tongue or throat swelling.  She denies any chest pain or shortness of breath.  She denies any difficulty breathing.   Takes Losartan 50 mg daily She does not take any NSAIDs  Gait / pain issues: Worse in the mornings, takes her awhile to get up and going. States that she was once told she has rheumatoid arthritis, left hand swelling and pain is the worst.  Very stiff and swollen joints in the morning.  Requesting pain medication to help get her going in the mornings.  She has previously taken Gabapentin 100 mg and said this didn't help, but the 300 mg seemed to be beneficial.  -She is requesting a shower chair. States it hurts to take a shower when she stands, but if she sits down in the bathtub, she cannot get back out of the tub.  Fatigue, lab review: -Feels very tired every morning, despite the amount of sleep she gets..  -Takes Trazodone 100 mg every night and Melatonin, which helps her sleep. Chronic history of insomnia, occasionally has had times where she hasn't sleep for 72 hours at a time.  -Last CBC on 05/05/2021 showed a hemoglobin of 11.9.  Patient states she denies any anemia history.  She denies any blood in her stool.  Her colonoscopy is up-to-date.  Past Medical History:  Diagnosis Date   Anxiety disorder 09/09/2019   Cancer [facility])    cervical cancer   Depression    Diverticulitis    Fatty liver    GERD (gastroesophageal reflux disease)    Hyperlipidemia    Hypertension    Insomnia 09/09/2019   Liver cyst    Vitamin D deficiency     Past Surgical History:   Procedure Laterality Date   ABDOMINAL HYSTERECTOMY     COLON RESECTION     due to diverticulitis    Family History  Problem Relation Age of Onset   Colon cancer Neg Hx    Esophageal cancer Neg Hx    Stomach cancer Neg Hx    Rectal cancer Neg Hx     Social History   Tobacco Use   Smoking status: Light Smoker    Packs/day: 0.50    Types: Cigarettes   Smokeless tobacco: Never  Vaping Use   Vaping Use: Never used  Substance Use Topics   Alcohol use: Yes    Comment: occasional beer or bloody mary once a week   Drug use: Never     No Known Allergies  Review of Systems NEGATIVE UNLESS OTHERWISE INDICATED IN HPI      Objective:     BP 110/72   Pulse 62   Temp 97.8 F (36.6 C)   Ht 5\' 1"  (1.549 m)   Wt 142 lb 3.2 oz (64.5 kg)   SpO2 98%   BMI 26.87 kg/m   Wt Readings from Last 3 Encounters:  05/21/21 142 lb 3.2 oz (64.5 kg)  05/05/21 144 lb 4 oz (65.4 kg)  09/16/20 131 lb (59.4 kg)    BP  Readings from Last 3 Encounters:  05/21/21 110/72  05/05/21 118/80  09/16/20 128/64     Physical Exam Vitals and nursing note reviewed.  Constitutional:      Appearance: Normal appearance. She is normal weight. She is not toxic-appearing.  HENT:     Head: Normocephalic and atraumatic.     Right Ear: External ear normal.     Left Ear: External ear normal.     Nose: Nose normal.     Mouth/Throat:     Mouth: Mucous membranes are moist.  Eyes:     Extraocular Movements: Extraocular movements intact.     Conjunctiva/sclera: Conjunctivae normal.     Pupils: Pupils are equal, round, and reactive to light.  Cardiovascular:     Rate and Rhythm: Normal rate and regular rhythm.     Pulses: Normal pulses.     Heart sounds: Normal heart sounds.  Pulmonary:     Effort: Pulmonary effort is normal.     Breath sounds: Normal breath sounds.  Abdominal:     Tenderness: There is abdominal tenderness (diffuse).  Musculoskeletal:        General: Normal range of motion.      Cervical back: Normal range of motion and neck supple.     Right lower leg: Edema (better) present.     Left lower leg: Edema (better) present.  Skin:    General: Skin is warm and dry.  Neurological:     General: No focal deficit present.     Mental Status: She is alert and oriented to person, place, and time.  Psychiatric:     Comments: Pleasant today       Assessment & Plan:   Problem List Items Addressed This Visit       Cardiovascular and Mediastinum   Essential hypertension   Relevant Orders   Comprehensive metabolic panel     Nervous and Auditory   Chronic right-sided low back pain with sciatica   Relevant Medications   gabapentin (NEURONTIN) 300 MG capsule   methylPREDNISolone (MEDROL DOSEPAK) 4 MG TBPK tablet   Other Relevant Orders   Rheumatoid Factor   Sedimentation rate   Shower chair     Other   Vitamin B12 deficiency   Relevant Orders   Vitamin B12   Vitamin D deficiency   Relevant Orders   VITAMIN D 25 Hydroxy (Vit-D Deficiency, Fractures)   Other Visit Diagnoses     Angioedema, initial encounter    -  Primary   Other fatigue       Relevant Orders   Iron, TIBC and Ferritin Panel   VITAMIN D 25 Hydroxy (Vit-D Deficiency, Fractures)   Vitamin B12   Comprehensive metabolic panel   CBC with Differential/Platelet   ANA   Rheumatoid Factor   Sedimentation rate   Uric acid   Arthritis, multiple joint involvement       Relevant Orders   ANA   Rheumatoid Factor   Sedimentation rate   Uric acid   Shower chair        Meds ordered this encounter  Medications   gabapentin (NEURONTIN) 300 MG capsule    Sig: Take 1 capsule (300 mg total) by mouth daily.    Dispense:  30 capsule    Refill:  0   methylPREDNISolone (MEDROL DOSEPAK) 4 MG TBPK tablet    Sig: Please take per packaging instructions.    Dispense:  21 tablet    Refill:  0   Plan: -Stop the Losartan altogether,  I suspect this is causing angioedema (swelling of lips). Monitor BP at  home.  Red flags discussed with patient about if and when she needs to go to the emergency department.  Patient agreeable and understanding. -Medrol dose pak (to help with swelling and arthritis pain) -Gabapentin 300 mg once daily to help with pain, possible side effects discussed - Labs as listed today -Shower chair ordered.  I also had a discussion with her about possibly bringing PT or OT into her home to help with safety measures and gait and balance, but patient declined at this time.  I plan to have close follow-up with patient in about 4 weeks.  She will call sooner if she has any problems.  This note was prepared with assistance of Systems analyst. Occasional wrong-word or sound-a-like substitutions may have occurred due to the inherent limitations of voice recognition software.  Time Spent: 50 minutes of total time was spent on the date of the encounter performing the following actions: chart review prior to seeing the patient, obtaining history, performing a medically necessary exam, counseling on the treatment plan, placing orders, and documenting in our EHR.    Noel Henandez M Arnelle Nale, PA-C

## 2021-05-21 NOTE — Patient Instructions (Addendum)
Stop the Losartan altogether, I suspect this is causing angioedema (swelling of lips). Monitor BP at home.  Medrol dose pak (to help with swelling and arthritis pain) Gabapentin once daily to help with pain  Labs today Shower chair ordered

## 2021-05-24 LAB — IRON,TIBC AND FERRITIN PANEL
%SAT: 23 % (calc) (ref 16–45)
Ferritin: 42 ng/mL (ref 16–288)
Iron: 76 ug/dL (ref 45–160)
TIBC: 337 mcg/dL (calc) (ref 250–450)

## 2021-05-24 LAB — RHEUMATOID FACTOR: Rheumatoid fact SerPl-aCnc: <14 IU/mL (ref <14)

## 2021-05-24 LAB — ANA: Anti Nuclear Antibody (ANA): NEGATIVE

## 2021-05-25 ENCOUNTER — Telehealth: Payer: Self-pay

## 2021-05-25 NOTE — Telephone Encounter (Signed)
error 

## 2021-05-25 NOTE — Telephone Encounter (Signed)
Pt called regarding a shower chair. She stated that Tracey Saunders told her that one would be ordered for her. Pt wants to know when she will be receiving it. Please Advise.

## 2021-05-25 NOTE — Telephone Encounter (Signed)
Nurse Assessment Nurse: Ahorlu, RN, Shellia Cleverly Date/Time (Eastern Time): 05/24/2021 3:43:39 PM Confirm and document reason for call. If symptomatic, describe symptoms. ---caller states she has swollen lip from losartan medication noticed on friday and now has blister inside of mouth and now burning   Does the patient have any new or worsening symptoms? ---Yes Will a triage be completed? ---Yes Related visit to physician within the last 2 weeks? ---Yes Does the PT have any chronic conditions? (i.e. diabetes, asthma, this includes High risk factors for pregnancy, etc.) ---Yes List chronic conditions. ---hypertension Is this a behavioral health or substance abuse call? ---No  Mouth Ulcers Probable canker sore(s) Ahorlu, RN, Shareese 05/24/2021 3:45:37 PM   05/24/2021 3:52:25 PM Home Care Yes Ahorlu, RN, Shareese HOME CARE: * You should be able to treat this at home. * Canker sores are the #1 cause of mouth ulcers. REASSURANCE AND EDUCATION - CANKER SORES: * They consist of 1 to 3 painful, white ulcers of the inner cheeks, inner lips, gums, floor of mouth, or tongue (no fever). * Canker sores are painful but harmless. There is no known cause. NON-PRESCRIPTION MOUTHWASH FOR MOUTH PAIN: * Dosage: Put a teaspoon (5 ml) in your mouth every 4 to 6 hours. Swish it around in your mouth for a minute then spit it out. CALL BACK IF: * Mouth ulcer(s) lasts over 2 weeks * You become worse CARE ADVICE given per Mouth Ulcers (Adult) guideline. * Avoid spicy and salty foods and avoid citrus fruits. * Avoid hard and crunchy foods. Reason: They cause pain in a canker sore

## 2021-05-25 NOTE — Telephone Encounter (Signed)
Patient notified order placed.The company will contact her once received

## 2021-05-25 NOTE — Telephone Encounter (Signed)
LAST APPOINTMENT DATE:  05/21/21  NEXT APPOINTMENT DATE: none  MEDICATION: Amlodipine  PHARMACY: [facility] [place], [place] to [facility]

## 2021-05-26 ENCOUNTER — Other Ambulatory Visit: Payer: Self-pay

## 2021-05-26 MED ORDER — HYDROCHLOROTHIAZIDE 12.5 MG PO TABS: 12.5000 mg | ORAL_TABLET | Freq: Every day | ORAL | 0 refills | Status: DC

## 2021-05-26 MED ORDER — GABAPENTIN 300 MG PO CAPS: 300.0000 mg | ORAL_CAPSULE | Freq: Every day | ORAL | 1 refills | Status: DC

## 2021-05-26 MED ORDER — PANTOPRAZOLE SODIUM 20 MG PO TBEC: 20.0000 mg | DELAYED_RELEASE_TABLET | Freq: Every day | ORAL | 1 refills | Status: DC

## 2021-05-26 MED ORDER — TRAZODONE HCL 50 MG PO TABS
100.0000 mg | ORAL_TABLET | Freq: Every evening | ORAL | 1 refills | Status: DC | PRN
Start: 2021-05-26 — End: 2021-10-12

## 2021-05-26 NOTE — Telephone Encounter (Signed)
Medication has been discontinued by provider. 

## 2021-05-26 NOTE — Telephone Encounter (Signed)
Please follow up with patient in regard. 

## 2021-05-26 NOTE — Telephone Encounter (Signed)
Patient called today requesting an update on the shower chair that was supposed to be ordered for her. Patient states she can not wait another week because it is too painful to stand up in the shower. She would like to know the following:  Where the chair was ordered  The phone number to the supplier so she can contact them   A phone call to discuss what her options are and see if the order can be sped up in any way  Patient has expressed deep concern about getting this process done quickly so that she can comfortably shower

## 2021-05-27 ENCOUNTER — Encounter: Payer: Self-pay | Admitting: Physician Assistant

## 2021-05-27 NOTE — Telephone Encounter (Signed)
Pt called stating that none of the phone numbers are working that she was told to call. Pt was told by one phone number told her that the order has not been placed. She wants to know right now what is going on. Pt stated that she will be filing a complaint.

## 2021-05-27 NOTE — Telephone Encounter (Signed)
Informed patient it will take over a week or 2 for supply's for a response

## 2021-05-27 NOTE — Telephone Encounter (Signed)
Faxed to [facility] at [phone]  Phone [phone] [facility]

## 2021-05-27 NOTE — Telephone Encounter (Signed)
Patient called in stating that the number she was provided does not work, Tracey Saunders states she is not happy with the process. After speaking with Isla Pence we gave her another number to try [phone]), patient verbalized that she will call them but if it doesn't work then she will call back.

## 2021-05-27 NOTE — Telephone Encounter (Signed)
Notified patient order sent to [facility] process takes 1 - 2 weeks or longer. Patient has been given number to [employer].

## 2021-05-27 NOTE — Telephone Encounter (Signed)
Pt called back very upset that no one has returned a call. She stated that she has been calling over a week and has had no response.

## 2021-06-01 ENCOUNTER — Telehealth: Payer: Self-pay

## 2021-06-01 NOTE — Telephone Encounter (Signed)
Patient is calling in stating that she received the shower chair but it wasn't the correct, states the order needs to be changed. Tracey Saunders is requesting a call back as soon possible.

## 2021-06-02 NOTE — Telephone Encounter (Signed)
Spoke with [patient informed that we will call Monday to order correct chair

## 2021-06-07 NOTE — Telephone Encounter (Signed)
See my message under routing comments.

## 2021-06-07 NOTE — Telephone Encounter (Signed)
Patient is calling in asking for more information, advised that Tracey Saunders is out and we are waiting for information.

## 2021-06-09 NOTE — Telephone Encounter (Signed)
New order placed

## 2021-06-09 NOTE — Addendum Note (Signed)
Addended by: Loralyn Freshwater L on: 06/09/2021 10:26 AM   Modules accepted: Orders

## 2021-06-10 NOTE — Telephone Encounter (Signed)
Notified patient of a new order was placed.Timeframe unknown she was informed to contact [employer]

## 2021-06-10 NOTE — Telephone Encounter (Signed)
Pt called again to check the status of the chair. I informed pt that another chair order has been placed. I informed pt that since the previous chair was sent back and a new one has been ordered, it will take time to process. Advised pt to call [facility] to see if they can give her an update.

## 2021-06-10 NOTE — Telephone Encounter (Signed)
Pt called back stating that she needs a nurse to call her back to discuss the shower chair. Please Advise.

## 2021-06-17 ENCOUNTER — Other Ambulatory Visit: Payer: Self-pay

## 2021-06-17 ENCOUNTER — Telehealth: Payer: Self-pay

## 2021-06-17 MED ORDER — ATORVASTATIN CALCIUM 20 MG PO TABS: 20.0000 mg | ORAL_TABLET | Freq: Every day | ORAL | 1 refills | Status: DC

## 2021-06-17 NOTE — Telephone Encounter (Signed)
Rx sent in

## 2021-06-17 NOTE — Telephone Encounter (Signed)
..   Encourage patient to contact the pharmacy for refills or they can request refills through [place]: 05/21/2021  NEXT APPOINTMENT DATE: na  MEDICATION:  Lipitor 20 mg  Is the patient out of medication?   PHARMACY:  [facility]   Let patient know to contact pharmacy at the end of the day to make sure medication is ready.  Please notify patient to allow 48-72 hours to process  CLINICAL FILLS OUT ALL BELOW:   LAST REFILL:  QTY:  REFILL DATE:    OTHER COMMENTS:    Okay for refill?  Please advise

## 2021-06-17 NOTE — Telephone Encounter (Signed)
Patient notified to contact [facility] [phone]

## 2021-06-17 NOTE — Telephone Encounter (Signed)
Pt called in regarding a shower chair. Informed pt that the new order was placed on 12/1 and the nurse does not know the timeframe for the chair. I informed pt that she may can try calling [employer] [facility] to see if they can give her an update. Pt began yelling and cursing that she needs her chair. She stated that she will be suing our office if she falls in the bath tub. Pt then hung up phone.

## 2021-06-22 ENCOUNTER — Encounter: Payer: Self-pay | Admitting: Physician Assistant

## 2021-06-22 ENCOUNTER — Ambulatory Visit: Payer: Medicare HMO | Admitting: Physician Assistant

## 2021-06-22 ENCOUNTER — Other Ambulatory Visit: Payer: Self-pay

## 2021-06-22 ENCOUNTER — Ambulatory Visit (INDEPENDENT_AMBULATORY_CARE_PROVIDER_SITE_OTHER): Payer: Medicare HMO | Admitting: Physician Assistant

## 2021-06-22 VITALS — BP 129/79 | HR 62 | Temp 98.0°F | Ht 61.0 in | Wt 144.2 lb

## 2021-06-22 DIAGNOSIS — J449 Chronic obstructive pulmonary disease, unspecified: Secondary | ICD-10-CM

## 2021-06-22 DIAGNOSIS — E538 Deficiency of other specified B group vitamins: Secondary | ICD-10-CM

## 2021-06-22 DIAGNOSIS — L710 Perioral dermatitis: Secondary | ICD-10-CM

## 2021-06-22 DIAGNOSIS — F17210 Nicotine dependence, cigarettes, uncomplicated: Secondary | ICD-10-CM | POA: Diagnosis not present

## 2021-06-22 DIAGNOSIS — R69 Illness, unspecified: Secondary | ICD-10-CM | POA: Diagnosis not present

## 2021-06-22 MED ORDER — METRONIDAZOLE 1 % EX GEL
Freq: Every day | CUTANEOUS | 0 refills | Status: DC
Start: 2021-06-22 — End: 2021-07-27

## 2021-06-22 MED ORDER — ALBUTEROL SULFATE HFA 108 (90 BASE) MCG/ACT IN AERS: 2.0000 | INHALATION_SPRAY | Freq: Four times a day (QID) | RESPIRATORY_TRACT | 2 refills | Status: DC | PRN

## 2021-06-22 MED ORDER — CEFUROXIME AXETIL 500 MG PO TABS: 500.0000 mg | ORAL_TABLET | Freq: Two times a day (BID) | ORAL | 0 refills | Status: DC

## 2021-06-22 MED ORDER — VITAMIN B-12 1000 MCG PO TABS: 1000.0000 ug | ORAL_TABLET | Freq: Every day | ORAL | 1 refills | Status: DC

## 2021-06-22 NOTE — Patient Instructions (Addendum)
Good to see you today. I have sent the medications to your pharmacy, please take these as directed. I would like to follow-up with you in about 8 weeks to make sure that you are improving.  Please feel free to call sooner if you have any concerns or anything changes. Keep working on quitting smoking altogether.

## 2021-06-22 NOTE — Progress Notes (Signed)
Subjective:    Patient ID: Tracey Saunders, female    DOB: 10-Apr-1956, 65 y.o.   MRN: [id number]  Chief Complaint  Patient presents with   Mouth Lesions   Asthma   B12 def    HPI Patient is in today for several different concerns.  Mouth lesions - Some swelling and small lesions around her mouth and lips that burn when she eats, limiting her intake recently because of the pain. Present for several weeks now. No known cause. No treatments tried so far. Asthmatic bronchitis - chronic hx of recurrence per patient. She has a hx of smoking. She states this flares up in winter time. She has not used an inhaler before. More coughing, some wheezing. No SOB, CP.  B12 def - She does not have reliable transportation to start injections. She is requesting liquid supplement.   Past Medical History:  Diagnosis Date   Anxiety disorder 09/09/2019   Cancer [facility])    cervical cancer   Depression    Diverticulitis    Fatty liver    GERD (gastroesophageal reflux disease)    Hyperlipidemia    Hypertension    Insomnia 09/09/2019   Liver cyst    Vitamin D deficiency     Past Surgical History:  Procedure Laterality Date   ABDOMINAL HYSTERECTOMY     COLON RESECTION     due to diverticulitis    Family History  Problem Relation Age of Onset   Colon cancer Neg Hx    Esophageal cancer Neg Hx    Stomach cancer Neg Hx    Rectal cancer Neg Hx     Social History   Tobacco Use   Smoking status: Light Smoker    Packs/day: 0.50    Types: Cigarettes   Smokeless tobacco: Never  Vaping Use   Vaping Use: Never used  Substance Use Topics   Alcohol use: Yes    Comment: occasional beer or bloody mary once a week   Drug use: Never     No Known Allergies  Review of Systems NEGATIVE UNLESS OTHERWISE INDICATED IN HPI      Objective:     BP 129/79    Pulse 62    Temp 98 F (36.7 C)    Ht 5\' 1"  (1.549 m)    Wt 144 lb 4 oz (65.4 kg)    SpO2 98%    BMI 27.26 kg/m   Wt Readings from Last 3  Encounters:  06/22/21 144 lb 4 oz (65.4 kg)  05/21/21 142 lb 3.2 oz (64.5 kg)  05/05/21 144 lb 4 oz (65.4 kg)    BP Readings from Last 3 Encounters:  06/22/21 129/79  05/21/21 110/72  05/05/21 118/80     Physical Exam Vitals and nursing note reviewed.  Constitutional:      Appearance: Normal appearance. She is normal weight. She is not toxic-appearing.  HENT:     Head: Normocephalic and atraumatic.     Right Ear: External ear normal.     Left Ear: External ear normal.     Nose: Nose normal.     Mouth/Throat:     Mouth: Mucous membranes are moist.  Eyes:     Extraocular Movements: Extraocular movements intact.     Conjunctiva/sclera: Conjunctivae normal.     Pupils: Pupils are equal, round, and reactive to light.  Cardiovascular:     Rate and Rhythm: Normal rate and regular rhythm.     Pulses: Normal pulses.     Heart sounds:  Normal heart sounds.  Pulmonary:     Effort: Pulmonary effort is normal.     Breath sounds: Wheezing (few faint scattered wheezes) present.  Musculoskeletal:        General: Normal range of motion.     Cervical back: Normal range of motion and neck supple.  Skin:    General: Skin is warm and dry.     Comments: Dryness, some small papular lesions on lips and surrounding mouth, none inside mouth  Neurological:     General: No focal deficit present.     Mental Status: She is alert and oriented to person, place, and time.  Psychiatric:        Mood and Affect: Mood normal.        Behavior: Behavior normal.        Thought Content: Thought content normal.        Judgment: Judgment normal.       Assessment & Plan:   Problem List Items Addressed This Visit       Other   Vitamin B12 deficiency   Other Visit Diagnoses     Perioral dermatitis    -  Primary   Asthmatic bronchitis , chronic (HCC)       Relevant Medications   albuterol (VENTOLIN HFA) 108 (90 Base) MCG/ACT inhaler   Cigarette nicotine dependence without complication             Meds ordered this encounter  Medications   metroNIDAZOLE (METROGEL) 1 % gel    Sig: Apply topically daily. Apply on lips and surrounding mouth once daily for 8 weeks.    Dispense:  45 g    Refill:  0   albuterol (VENTOLIN HFA) 108 (90 Base) MCG/ACT inhaler    Sig: Inhale 2 puffs into the lungs every 6 (six) hours as needed for wheezing or shortness of breath.    Dispense:  1 each    Refill:  2   vitamin B-12 (CYANOCOBALAMIN) 1000 MCG tablet    Sig: Take 1 tablet (1,000 mcg total) by mouth daily.    Dispense:  90 tablet    Refill:  1    LIQUID EQUIVALENT OK TO SUBSTITUTE IF AVAILABLE PLEASE   cefUROXime (CEFTIN) 500 MG tablet    Sig: Take 1 tablet (500 mg total) by mouth 2 (two) times daily with a meal for 7 days.    Dispense:  14 tablet    Refill:  0   1. Perioral dermatitis -I think her lesions match with perioral dermatitis. Will start on metronidazole. Avoid spicy foods. Recheck in about 8 weeks.  2. Asthmatic bronchitis , chronic (HCC) -Ceftin as directed and rescue inhaler. Will avoid inhaled steroids at this time due to #1; although I do think she would benefit from this long term.  3. Vitamin B12 deficiency -Liquid B12 supplement sent for pt to start on daily  4. Cigarette nicotine dependence without complication -She has reduced her smoking over time, but says she does get bored and depressed and uses it as coping mechanism. Encouraged her to think about quitting completely.     This note was prepared with assistance of Systems analyst. Occasional wrong-word or sound-a-like substitutions may have occurred due to the inherent limitations of voice recognition software.  Time Spent: 25 minutes of total time was spent on the date of the encounter performing the following actions: chart review prior to seeing the patient, obtaining history, performing a medically necessary exam, counseling on the treatment  plan, placing orders, and documenting in our  EHR.    Sora Vrooman M Mattison Stuckey, PA-C

## 2021-06-24 ENCOUNTER — Ambulatory Visit: Payer: Medicare (Managed Care) | Admitting: Nurse Practitioner

## 2021-06-25 ENCOUNTER — Telehealth: Payer: Self-pay | Admitting: Physician Assistant

## 2021-06-25 NOTE — Telephone Encounter (Signed)
Patient called again because mouth is still burning and she is unable to eat. Patient is scheduled with Dr.Kulik on Tuesday but wants to know if there is anything that can be done before the weekend.     Good callback number is [phone]

## 2021-06-26 ENCOUNTER — Other Ambulatory Visit: Payer: Self-pay

## 2021-06-26 ENCOUNTER — Emergency Department (HOSPITAL_COMMUNITY)
Admission: EM | Admit: 2021-06-26 | Discharge: 2021-06-26 | Disposition: A | Discharge status: Home / Self Care | Payer: Medicare HMO | Attending: Emergency Medicine | Admitting: Emergency Medicine

## 2021-06-26 ENCOUNTER — Encounter (HOSPITAL_COMMUNITY): Payer: Self-pay

## 2021-06-26 DIAGNOSIS — K120 Recurrent oral aphthae: Secondary | ICD-10-CM | POA: Diagnosis not present

## 2021-06-26 DIAGNOSIS — K1379 Other lesions of oral mucosa: Secondary | ICD-10-CM | POA: Diagnosis not present

## 2021-06-26 DIAGNOSIS — Z79899 Other long term (current) drug therapy: Secondary | ICD-10-CM | POA: Diagnosis not present

## 2021-06-26 DIAGNOSIS — F17210 Nicotine dependence, cigarettes, uncomplicated: Secondary | ICD-10-CM | POA: Diagnosis not present

## 2021-06-26 DIAGNOSIS — I10 Essential (primary) hypertension: Secondary | ICD-10-CM | POA: Diagnosis not present

## 2021-06-26 DIAGNOSIS — Z8541 Personal history of malignant neoplasm of cervix uteri: Secondary | ICD-10-CM | POA: Insufficient documentation

## 2021-06-26 DIAGNOSIS — R69 Illness, unspecified: Secondary | ICD-10-CM | POA: Diagnosis not present

## 2021-06-26 DIAGNOSIS — R6884 Jaw pain: Secondary | ICD-10-CM | POA: Diagnosis not present

## 2021-06-26 MED ORDER — AMOXICILLIN-POT CLAVULANATE 875-125 MG PO TABS: 1.0000 | ORAL_TABLET | Freq: Two times a day (BID) | ORAL | 0 refills | Status: DC

## 2021-06-26 NOTE — ED Triage Notes (Signed)
Pt arrived POV from home c/o her mouth being on fire for over 6 weeks. PT states I can't take it anymore I can't sleep or eat without feeling like my mouth is on fire. Pt states it started with her lips swelling but that went away. Pt was prescribed an antibiotic and told it was a medication reaction but she could not get the antibiotic.

## 2021-06-26 NOTE — Discharge Instructions (Addendum)
I prescribed you an antibiotic called Augmentin.  Please take this twice a day for the next 7 days.  Below is a referral to a local ear, nose, and throat physician.  Please give them a call and schedule an appointment for reevaluation.  I would also recommend calling your dental provider.  If you develop any new or worsening symptoms please come back to the emergency department.

## 2021-06-26 NOTE — ED Provider Notes (Signed)
[facility] EMERGENCY DEPARTMENT Provider Note   CSN: [id number] Arrival date & time: 06/26/21  1536     History Chief Complaint  Patient presents with   Oral Pain    Tracey Saunders is a 65 y.o. female.  HPI Patient is a 65 year old female with a medical history as noted below.  She presents to the emergency department due to oral pain.  Patient states her symptoms have been waxing and waning for the past 2 months.  They worsen significantly when eating.  No fevers, chills, difficulty swallowing, sore throat, chest pain, shortness of breath, URI symptoms.  Patient states that initially 2 months ago she had lesions around the mouth as well and was evaluated by her PCP.  She states that her losartan was discontinued but did not feel that this significantly improved her symptoms.  She states that the lesions resolved with time and her pain has persisted.  She reports mild swelling to the chin.  Patient states she wears dentures but has not been wearing them recently due to her pain.  She states that she was prescribed metronidazole for possible perioral dermatitis but states that she was unable to fill this due to being unavailable at her pharmacy.    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     Family History  Problem Relation Age of Onset   Colon cancer Neg Hx    Esophageal cancer Neg Hx    Stomach cancer Neg Hx    Rectal cancer Neg Hx     Social History   Tobacco Use   Smoking status: Light Smoker    Packs/day: 0.50    Types: Cigarettes   Smokeless tobacco: Never  Vaping Use   Vaping Use: Never used  Substance Use Topics   Alcohol use: Yes    Comment: occasional beer or bloody mary once a week   Drug use: Never    Home Medications Prior to Admission medications   Medication Sig Start Date End Date Taking? Authorizing Provider  amoxicillin-clavulanate (AUGMENTIN) 875-125 MG tablet Take 1 tablet by mouth every 12 (twelve) hours. 06/26/21  Yes Rayna Sexton, PA-C  albuterol (VENTOLIN HFA) 108 (90 Base) MCG/ACT inhaler Inhale 2 puffs into the lungs every 6 (six) hours as needed for wheezing or shortness of breath. 06/22/21   Allwardt, Randa Evens, PA-C  atorvastatin (LIPITOR) 20 MG tablet Take 1 tablet (  20 mg total) by mouth daily. 06/17/21   Allwardt, Randa Evens, PA-C  dicyclomine (BENTYL) 10 MG capsule Take 1 capsule (10 mg total) by mouth 4 (four) times daily -  before meals and at bedtime. 05/05/21 06/22/21  Allwardt, Randa Evens, PA-C  gabapentin (NEURONTIN) 300 MG capsule Take 1 capsule (300 mg total) by mouth daily. 05/26/21 08/24/21  Allwardt, Randa Evens, PA-C  hydrochlorothiazide (HYDRODIURIL) 12.5 MG tablet Take 1 tablet (12.5 mg total) by mouth daily. 05/26/21 08/24/21  Allwardt, Randa Evens, PA-C  loratadine (CLARITIN) 10 MG tablet Take 1 tablet (10 mg total) by mouth daily. 09/16/20   Alycia Rossetti, MD  losartan (COZAAR) 50 MG tablet Take 0.5 tablets (25 mg total) by mouth  daily. 09/16/20   Alycia Rossetti, MD  methylPREDNISolone (MEDROL DOSEPAK) 4 MG TBPK tablet Please take per packaging instructions. 05/21/21   Allwardt, Randa Evens, PA-C  metroNIDAZOLE (METROGEL) 1 % gel Apply topically daily. Apply on lips and surrounding mouth once daily for 8 weeks. 06/22/21   Allwardt, Alyssa M, PA-C  montelukast (SINGULAIR) 10 MG tablet TAKE 1 TABLET BY MOUTH EVERYDAY AT BEDTIME 09/16/20   Jerome, Modena Nunnery, MD  pantoprazole (PROTONIX) 20 MG tablet Take 1 tablet (20 mg total) by mouth daily. 05/26/21 08/24/21  Allwardt, Randa Evens, PA-C  sertraline (ZOLOFT) 50 MG tablet Take 1.5 tablets (75 mg total) by mouth at bedtime. 10/08/20   Susy Frizzle, MD  traZODone (DESYREL) 50 MG tablet Take 2 tablets (100 mg total) by mouth at bedtime as needed for sleep. 05/26/21   Allwardt, Randa Evens, PA-C  vitamin B-12 (CYANOCOBALAMIN) 1000 MCG tablet Take 1 tablet (1,000 mcg total) by mouth daily. 06/22/21 09/20/21  Allwardt, Randa Evens, PA-C    Allergies    Patient has no known allergies.  Review of Systems   Review of Systems  Constitutional:  Negative for chills and fever.  HENT:  Positive for dental problem and facial swelling. Negative for sore throat and trouble swallowing.   Respiratory:  Negative for shortness of breath.   Cardiovascular:  Negative for chest pain.   Physical Exam Updated Vital Signs BP 137/74    Pulse 63    Temp 98.3 F (36.8 C) (Oral)    Resp 14    Ht 5\' 1"  (1.549 m)    Wt 63.5 kg    SpO2 100%    BMI 26.45 kg/m   Physical Exam Vitals and nursing note reviewed.  Constitutional:      General: She is not in acute distress.    Appearance: Normal appearance. She is not ill-appearing, toxic-appearing or diaphoretic.  HENT:     Head: Normocephalic and atraumatic.     Right Ear: External ear normal.     Left Ear: External ear normal.     Nose: Nose normal.     Mouth/Throat:     Mouth: Mucous membranes are moist.     Pharynx: Oropharynx is clear. No oropharyngeal  exudate or posterior oropharyngeal erythema.     Comments: Aphthous ulcer noted to the right lower lip.  Patient wears dentures and most of her teeth are missing.  No tenderness appreciated with palpation of the gums.  No visible or palpable fluctuance noted.  Readily handling secretions.  Uvula midline.  No erythema noted in the posterior oropharynx.  Soft submental compartments.  No rash or other skin changes noted in the perioral space.  No edema or tenderness noted along the submandibular or parotid glands. Eyes:  Extraocular Movements: Extraocular movements intact.  Cardiovascular:     Rate and Rhythm: Normal rate and regular rhythm.     Pulses: Normal pulses.     Heart sounds: Normal heart sounds. No murmur heard.   No friction rub. No gallop.  Pulmonary:     Effort: Pulmonary effort is normal. No respiratory distress.     Breath sounds: Normal breath sounds. No stridor. No wheezing, rhonchi or rales.  Abdominal:     General: Abdomen is flat.     Palpations: Abdomen is soft.     Tenderness: There is no abdominal tenderness.  Musculoskeletal:        General: Normal range of motion.     Cervical back: Normal range of motion and neck supple. No tenderness.  Skin:    General: Skin is warm and dry.  Neurological:     General: No focal deficit present.     Mental Status: She is alert and oriented to person, place, and time.  Psychiatric:        Mood and Affect: Mood normal.        Behavior: Behavior normal.    ED Results / Procedures / Treatments   Labs (all labs ordered are listed, but only abnormal results are displayed) Labs Reviewed - No data to display  EKG None  Radiology No results found.  Procedures Procedures   Medications Ordered in ED Medications - No data to display  ED Course  I have reviewed the triage vital signs and the nursing notes.  Pertinent labs & imaging results that were available during my care of the patient were reviewed by me and  considered in my medical decision making (see chart for details).    MDM Rules/Calculators/A&P                          Patient is a 65 year old female who presents to the emergency department for oral pain.  On my exam patient has an aphthous ulcer to the right lower lip but otherwise no significant acute abnormalities were noted.  No lesions or edema appreciated.  No overlying skin changes in the perioral space.  No erythema or exudates in the posterior oropharynx.  Readily handling secretions.  No edema or tenderness noted overlying the submandibular or parotid glands.  Does not appear consistent with salivary duct obstruction.  Soft submental space.  Doubt PTA or Ludwig's angina at this time.  Will discharge patient on a course of Augmentin for a possible developing dental infection.  She was given a referral to ENT.  Patient is also going to reach out to a dental provider.  We discussed return precautions.  Feel that she is stable for discharge at this time and she is agreeable.  Her questions were answered and she was amicable at the time of discharge.  Final Clinical Impression(s) / ED Diagnoses Final diagnoses:  Oral pain   Rx / DC Orders ED Discharge Orders          Ordered    amoxicillin-clavulanate (AUGMENTIN) 875-125 MG tablet  Every 12 hours        06/26/21 1659             Rayna Sexton, PA-C 06/26/21 1710    Lajean Saver, MD 06/26/21 1844

## 2021-06-26 NOTE — ED Provider Notes (Signed)
Emergency Medicine Provider Triage Evaluation Note  Tracey Saunders , a 65 y.o. female  was evaluated in triage.  Pt complains of swelling to chin.  Pt was told it was from medication.  Pt's provider stopped her losartan  Review of Systems  Positive: Shotting pain Negative: No fever or chills  Physical Exam  BP (!) 142/85 (BP Location: Right Arm)    Pulse 86    Temp 98.3 F (36.8 C) (Oral)    Resp 18    Ht 5\' 1"  (1.549 m)    Wt 63.5 kg    SpO2 99%    BMI 26.45 kg/m  Gen:   Awake, no distress   Resp:  Normal effort  MSK:    Moves extremities without difficulty  Other:    Medical Decision Making  Medically screening exam initiated at 4:21 PM.  Appropriate orders placed.  Tracey Saunders was informed that the remainder of the evaluation will be completed by another provider, this initial triage assessment does not replace that evaluation, and the importance of remaining in the ED until their evaluation is complete.     Fransico Meadow, [place] 06/26/21 1623    Fredia Sorrow, MD 06/30/21 1322

## 2021-06-27 NOTE — Telephone Encounter (Signed)
She went to the ED

## 2021-06-28 ENCOUNTER — Ambulatory Visit: Payer: Medicare HMO | Admitting: Family Medicine

## 2021-06-29 ENCOUNTER — Ambulatory Visit: Payer: Medicare HMO | Admitting: Family Medicine

## 2021-07-07 ENCOUNTER — Telehealth: Payer: Self-pay | Admitting: Physician Assistant

## 2021-07-07 NOTE — Telephone Encounter (Signed)
Appt scheduled

## 2021-07-09 ENCOUNTER — Other Ambulatory Visit: Payer: Self-pay

## 2021-07-09 ENCOUNTER — Encounter: Payer: Self-pay | Admitting: Family Medicine

## 2021-07-09 ENCOUNTER — Ambulatory Visit (INDEPENDENT_AMBULATORY_CARE_PROVIDER_SITE_OTHER): Payer: Medicare HMO | Admitting: Family Medicine

## 2021-07-09 VITALS — BP 130/88 | HR 82 | Temp 98.3°F | Ht 61.0 in | Wt 144.3 lb

## 2021-07-09 DIAGNOSIS — L853 Xerosis cutis: Secondary | ICD-10-CM

## 2021-07-09 DIAGNOSIS — R220 Localized swelling, mass and lump, head: Secondary | ICD-10-CM | POA: Diagnosis not present

## 2021-07-09 MED ORDER — TRIAMCINOLONE ACETONIDE 0.1 % EX OINT: 1.0000 "application " | TOPICAL_OINTMENT | Freq: Two times a day (BID) | CUTANEOUS | 0 refills | Status: DC

## 2021-07-09 MED ORDER — METRONIDAZOLE 0.75 % VA GEL: 1.0000 | Freq: Every day | VAGINAL | 0 refills | Status: DC

## 2021-07-09 NOTE — Progress Notes (Signed)
Subjective:     Patient ID: Tracey Saunders, female    DOB: 18-Jul-1955, 65 y.o.   MRN: [id number]  Chief Complaint  Patient presents with   Lip issue    Started in August, wake up with swelling in bottom lip and numb Lip feels like it is on fire sometimes, on going, has not stopped Was an allergic reaction to medication Having sharp pains in lip that just started     HPI Lips swelling since August.  Was seen in Nov and thought to be d/t losartan.  Was d/c'd but symptoms persisted so seen again and thought to be  perioral dermatitis-but pt unable to fill metronidazole.   Just the bottom lip.  Did have "mouth sores" at the beginning, but gone for 1 month now.  Tip of tongue feels a little tingly still.  Lower lip still swollen and tingly-worst is first am when gets up. Was hard to eat in beginning but better now   Still getting lower lip swelling.   Went to ED on 12/17 as mouth was "on fire"and placed on augmentin and referred to ENT-told pt to sch-hasn't yet as she didn't understand why   Can wake up w/lip swelling and numb"as hell" Has dentures but can't wear them.  not using lipstick/chapstick. No new foods/laundry soap No f/c. No throat swelling.  Will order B12 from insurance company first of yr but quit taking OTC as "wasn't helping"  R index finger swollen and cracked for 2 months-can't get it to heal.  Using lotion.  No d/c.  Health Maintenance Due  Topic Date Due   Hepatitis C Screening  Never done   MAMMOGRAM  Never done   DEXA SCAN  Never done   COVID-19 Vaccine (3 - Booster for [employer] series) 02/12/2020    Past Medical History:  Diagnosis Date   Anxiety disorder 09/09/2019   Cancer ([place])    cervical cancer   Depression    Diverticulitis    Fatty liver    GERD (gastroesophageal reflux disease)    Hyperlipidemia    Hypertension    Insomnia 09/09/2019   Liver cyst    Vitamin D deficiency     Past Surgical History:  Procedure Laterality Date   ABDOMINAL  HYSTERECTOMY     COLON RESECTION     due to diverticulitis    Outpatient Medications Prior to Visit  Medication Sig Dispense Refill   albuterol (VENTOLIN HFA) 108 (90 Base) MCG/ACT inhaler Inhale 2 puffs into the lungs every 6 (six) hours as needed for wheezing or shortness of breath. 1 each 2   amoxicillin-clavulanate (AUGMENTIN) 875-125 MG tablet Take 1 tablet by mouth every 12 (twelve) hours. 14 tablet 0   atorvastatin (LIPITOR) 20 MG tablet Take 1 tablet (20 mg total) by mouth daily. 90 tablet 1   gabapentin (NEURONTIN) 300 MG capsule Take 1 capsule (300 mg total) by mouth daily. 90 capsule 1   hydrochlorothiazide (HYDRODIURIL) 12.5 MG tablet Take 1 tablet (12.5 mg total) by mouth daily. 90 tablet 0   loratadine (CLARITIN) 10 MG tablet Take 1 tablet (10 mg total) by mouth daily. 90 tablet 1   metroNIDAZOLE (METROGEL) 1 % gel Apply topically daily. Apply on lips and surrounding mouth once daily for 8 weeks. 45 g 0   montelukast (SINGULAIR) 10 MG tablet TAKE 1 TABLET BY MOUTH EVERYDAY AT BEDTIME 90 tablet 3   pantoprazole (PROTONIX) 20 MG tablet Take 1 tablet (20 mg total) by mouth daily. [place]  tablet 1   sertraline (ZOLOFT) 50 MG tablet Take 1.5 tablets (75 mg total) by mouth at bedtime. 135 tablet 0   traZODone (DESYREL) 50 MG tablet Take 2 tablets (100 mg total) by mouth at bedtime as needed for sleep. 180 tablet 1   dicyclomine (BENTYL) 10 MG capsule Take 1 capsule (10 mg total) by mouth 4 (four) times daily -  before meals and at bedtime. 120 capsule 0   vitamin B-12 (CYANOCOBALAMIN) 1000 MCG tablet Take 1 tablet (1,000 mcg total) by mouth daily. (Patient not taking: Reported on 07/09/2021) 90 tablet 1   losartan (COZAAR) 50 MG tablet Take 0.5 tablets (25 mg total) by mouth daily. (Patient not taking: Reported on 07/09/2021) 90 tablet 1   methylPREDNISolone (MEDROL DOSEPAK) 4 MG TBPK tablet Please take per packaging instructions. (Patient not taking: Reported on 07/09/2021) 21 tablet 0    No facility-administered medications prior to visit.    Allergies  Allergen Reactions   Aleve-D Sinus & Cold [Pseudoephedrine-Naproxen Na Er]    CHE:NIDPOEUM/[id number] except as noted in HPI      Objective:     BP 130/88    Pulse 82    Temp 98.3 F (36.8 C) (Temporal)    Ht 5\' 1"  (1.549 m)    Wt 144 lb 5 oz (65.5 kg)    SpO2 98%    BMI 27.27 kg/m  Wt Readings from Last 3 Encounters:  07/09/21 144 lb 5 oz (65.5 kg)  06/26/21 140 lb (63.5 kg)  06/22/21 144 lb 4 oz (65.4 kg)        Gen: WDWN NAD WF HEENT: NCAT, conjunctiva not injected, sclera nonicteric TM WNL B, OP moist, no exudates    edendulous-no lesions seen nor palp.  Lower lip tender.  ?mild swelling.  No lesions.  NECK:  supple, no thyromegaly, no nodes, no carotid bruits CARDIAC: RRR, S1S2+, no murmur.  LUNGS: CTAB. No wheezes EXT:  no edema MSK: cane.  NEURO: A&O x3.  CN II-XII intact.  PSYCH: normal mood. Good eye contact R index finger lateral side-very dry w/cracked skin-tender.  Not red. No d/c.   Reviewed notes, labs-spent addn'l 22minutes  Assessment & Plan:   Problem List Items Addressed This Visit   None Visit Diagnoses     Lip swelling    -  Primary   Relevant Orders   Ambulatory referral to ENT   Dry skin dermatitis          Lip swelling/numbness/tingle.  ?B12 deficiency, intermitt angioedema, allergy to pillow or soap, other.  Has been off Losartan for approx 7 wks.  Has just finished augmentin-no change. ? Burning mouth syndrome.  ? Dermatitis.  Will try metrogel, vaseline.  Try towel over pillow in case feathers.  Try ALL free and clear detergent.  To ENT Finger swelling/crack-doubt infection(on augmentin).  Use vaseline and add triamcinolone oint to aid in healing.   Pt has financial and xportation constraints.  30 minutes w/pt  Meds ordered this encounter  Medications   metroNIDAZOLE (METROGEL VAGINAL) 0.75 % vaginal gel    Sig: Place 1 Applicatorful vaginally at bedtime.     Dispense:  70 g    Refill:  0   triamcinolone ointment (KENALOG) 0.1 %    Sig: Apply 1 application topically 2 (two) times daily.    Dispense:  30 g    Refill:  0    Wellington Hampshire., MD

## 2021-07-09 NOTE — Patient Instructions (Addendum)
I am referring to ENT for lips Vasaline on finger and lips Steroid ointment on finger Metronidazole ointment on lips twice daily   Put towel over pillow Get all free and clear laundry soap.

## 2021-07-14 DIAGNOSIS — H43393 Other vitreous opacities, bilateral: Secondary | ICD-10-CM | POA: Diagnosis not present

## 2021-07-14 DIAGNOSIS — Z0100 Encounter for examination of eyes and vision without abnormal findings: Secondary | ICD-10-CM | POA: Diagnosis not present

## 2021-07-26 ENCOUNTER — Telehealth: Payer: Self-pay

## 2021-07-26 NOTE — Telephone Encounter (Signed)
Patient is scheduled for 1/18.  Declined sooner appt due to transportation concerns.  Patient Name: Tracey Saunders Gender: Female DOB: 09-18-1955 Age: 66 Y 2 M 1 D Return Phone Number: [phone] (Primary) Address: City/ State/ Zip: [place]  [address] Client [facility] [place] Client Site [place] [facility] [employer] Type Call Who Is Calling Patient / Member / Family / Caregiver Call Type Triage / Clinical Relationship To Patient Self Return Phone Number [phone] (Primary) Chief Complaint Allergic reaction (unknown symptoms) Reason for Call Symptomatic / Request for Health Information Initial Comment Caller states her lip has been swelling and burning. She is unsure why. Translation No Nurse Assessment Nurse: Ottis Stain, RN, Sherrie Date/Time (Eastern Time): 07/26/2021 9:00:05 AM Confirm and document reason for call. If symptomatic, describe symptoms. ---Caller states bottom lip has been swelling and burning. States has been on and off for the last several months. Seen in ED and PCP about it. States lip started burning last and woke up this am with swollen lip. No tongue swelling and no rash. Does the patient have any new or worsening symptoms? ---Yes Will a triage be completed? ---Yes Related visit to physician within the last 2 weeks? ---No Does the PT have any chronic conditions? (i.e. diabetes, asthma, this includes High risk factors for pregnancy, etc.) ---Yes List chronic conditions. ---asthma, HTN Is this a behavioral health or substance abuse call? ---No Guidelines Guideline Title Affirmed Question Affirmed Notes Nurse Date/Time (Eastern Time) Lip Swelling Swelling is painful to touch Ottis Stain, RN, Sherrie 07/26/2021 9:04:04 AM Disp. Time Eilene Ghazi Time) Disposition Final User 07/26/2021 9:11:49 AM See PCP within 24 Hours Yes Ottis Stain, RN, Sherrie Caller Disagree/Comply Disagree Caller Understands  Yes PreDisposition Home Care Care Advice Given Per Guideline SEE PCP WITHIN 24 HOURS: * IF OFFICE WILL BE OPEN: You need to be examined within the next 24 hours. Call your doctor (or NP/PA) when the office opens and make an appointment. CALL BACK IF: * You become worse Comments User: Evlyn Clines, RN Date/Time (Eastern Time): 07/26/2021 9:07:30 AM BP med: HCTZ, Referrals REFERRED TO PCP OFFICE

## 2021-07-27 ENCOUNTER — Ambulatory Visit (INDEPENDENT_AMBULATORY_CARE_PROVIDER_SITE_OTHER): Payer: Medicare HMO | Admitting: Family Medicine

## 2021-07-27 ENCOUNTER — Other Ambulatory Visit: Payer: Self-pay

## 2021-07-27 ENCOUNTER — Encounter: Payer: Self-pay | Admitting: Family Medicine

## 2021-07-27 VITALS — BP 122/70 | HR 84 | Temp 97.9°F | Ht 61.0 in | Wt 144.0 lb

## 2021-07-27 DIAGNOSIS — K120 Recurrent oral aphthae: Secondary | ICD-10-CM

## 2021-07-27 MED ORDER — PREDNISONE 20 MG PO TABS: 40.0000 mg | ORAL_TABLET | Freq: Every day | ORAL | 0 refills | Status: AC

## 2021-07-27 NOTE — Patient Instructions (Signed)
Meds have been sent the the pharmacy You can take tylenol for pain/fevers If worsening symptoms, let us know or go to the Emergency room   L Lysine

## 2021-07-27 NOTE — Progress Notes (Signed)
Subjective:     Patient ID: Tracey Saunders, female    DOB: 1955-08-11, 66 y.o.   MRN: [id number]  Chief Complaint  Patient presents with   Lip swelling x 1 month    Lip swelling x 1 month, not getting better Painful, feels like burning fire inside left side of mouth     HPI Woke up 3am this morning and severe pain lower lip, burning and "on fire".  Can't eat, etc d/t pain.   Swelling intermitt and can be a lot.  Constant burn "24 hrs/day"    Can't do this anymore per pt.  Hasn't heard from ENT.  Always on R side.   No fever no hoarseness.  Feels "sores" at times. On R  Health Maintenance Due  Topic Date Due   Hepatitis C Screening  Never done   MAMMOGRAM  Never done   DEXA SCAN  Never done   COVID-19 Vaccine (3 - Booster for [employer] series) 02/12/2020    Past Medical History:  Diagnosis Date   Anxiety disorder 09/09/2019   Cancer ([place])    cervical cancer   Depression    Diverticulitis    Fatty liver    GERD (gastroesophageal reflux disease)    Hyperlipidemia    Hypertension    Insomnia 09/09/2019   Liver cyst    Vitamin D deficiency     Past Surgical History:  Procedure Laterality Date   ABDOMINAL HYSTERECTOMY     COLON RESECTION     due to diverticulitis    Outpatient Medications Prior to Visit  Medication Sig Dispense Refill   albuterol (VENTOLIN HFA) 108 (90 Base) MCG/ACT inhaler Inhale 2 puffs into the lungs every 6 (six) hours as needed for wheezing or shortness of breath. 1 each 2   atorvastatin (LIPITOR) 20 MG tablet Take 1 tablet (20 mg total) by mouth daily. 90 tablet 1   gabapentin (NEURONTIN) 300 MG capsule Take 1 capsule (300 mg total) by mouth daily. 90 capsule 1   hydrochlorothiazide (HYDRODIURIL) 12.5 MG tablet Take 1 tablet (12.5 mg total) by mouth daily. 90 tablet 0   loratadine (CLARITIN) 10 MG tablet Take 1 tablet (10 mg total) by mouth daily. 90 tablet 1   montelukast (SINGULAIR) 10 MG tablet TAKE 1 TABLET BY MOUTH EVERYDAY AT BEDTIME 90 tablet 3    omeprazole (PRILOSEC) 40 MG capsule Take 40 mg by mouth daily.     pantoprazole (PROTONIX) 20 MG tablet Take 1 tablet (20 mg total) by mouth daily. 90 tablet 1   sertraline (ZOLOFT) 50 MG tablet Take 1.5 tablets (75 mg total) by mouth at bedtime. 135 tablet 0   traZODone (DESYREL) 50 MG tablet Take 2 tablets (100 mg total) by mouth at bedtime as needed for sleep. 180 tablet 1   triamcinolone ointment (KENALOG) 0.1 % Apply 1 application topically 2 (two) times daily. 30 g 0   vitamin B-12 (CYANOCOBALAMIN) 1000 MCG tablet Take 1 tablet (1,000 mcg total) by mouth daily. 90 tablet 1   metroNIDAZOLE (METROGEL VAGINAL) 0.75 % vaginal gel Place 1 Applicatorful vaginally at bedtime. 70 g 0   metroNIDAZOLE (METROGEL) 1 % gel Apply topically daily. Apply on lips and surrounding mouth once daily for 8 weeks. 45 g 0   dicyclomine (BENTYL) 10 MG capsule Take 1 capsule (10 mg total) by mouth 4 (four) times daily -  before meals and at bedtime. 120 capsule 0   amoxicillin-clavulanate (AUGMENTIN) 875-125 MG tablet Take 1 tablet by mouth every  12 (twelve) hours. (Patient not taking: Reported on 07/27/2021) 14 tablet 0   No facility-administered medications prior to visit.    Allergies  Allergen Reactions   Aleve-D Sinus & Cold [Pseudoephedrine-Naproxen Na Er]    WNI:[id number] except as noted in HPI      Objective:     BP 122/70    Pulse 84    Temp 97.9 F (36.6 C) (Temporal)    Ht 5\' 1"  (1.549 m)    Wt 144 lb (65.3 kg)    SpO2 98%    BMI 27.21 kg/m  Wt Readings from Last 3 Encounters:  07/27/21 144 lb (65.3 kg)  07/09/21 144 lb 5 oz (65.5 kg)  06/26/21 140 lb (63.5 kg)        Gen: WDWN NAD-irate wf HEENT: NCAT, conjunctiva not injected, sclera nonicteric TM WNL l,  wax R, OP moist, no exudates - there are few aphthous ulcers R upper and lower cheek/gums.  Tender. Some mild swelling lower face. NECK:  supple, no thyromegaly, no nodes, no carotid bruits EXT:  no edema MSK:  cane NEURO: A&O x3.  CN II-XII intact.  PSYCH: normal mood. Good eye contact  Assessment & Plan:   Problem List Items Addressed This Visit   None Visit Diagnoses     Aphthous ulcer    -  Primary      Aphthous ulcers-recurring.  Causing a great deal of pain and duress.  Will do prednisone.  Can do L lysine.  Will check on referral for ENT  Meds ordered this encounter  Medications   predniSONE (DELTASONE) 20 MG tablet    Sig: Take 2 tablets (40 mg total) by mouth daily with breakfast for 7 days.    Dispense:  14 tablet    Refill:  0    Wellington Hampshire, MD

## 2021-07-28 ENCOUNTER — Ambulatory Visit: Payer: Medicare HMO | Admitting: Family Medicine

## 2021-08-14 ENCOUNTER — Other Ambulatory Visit: Payer: Self-pay | Admitting: Internal Medicine

## 2021-08-24 ENCOUNTER — Other Ambulatory Visit: Payer: Self-pay | Admitting: Family Medicine

## 2021-08-24 ENCOUNTER — Telehealth: Payer: Self-pay | Admitting: Family Medicine

## 2021-08-24 MED ORDER — AMLODIPINE BESYLATE 5 MG PO TABS: 5.0000 mg | ORAL_TABLET | Freq: Every day | ORAL | 0 refills | Status: DC

## 2021-08-24 NOTE — Telephone Encounter (Signed)
Spoke to patient and she stated that she has been experiencing higher than normal blood pressures. Patient stated that she stay stressed and makes her blood pressure go up and causes her to have a bad headache. Patient stated that she need another blood pressure medication to go along with the one she has to help control it. At 1020 am, I had patient check blood pressure and it was 154/77. Offered vv and patient declined because she was charged $175 last time she had a virtual visit. Offered an office visit, patient stated that she would need a day in advance due to transportation. Please advise.

## 2021-08-24 NOTE — Telephone Encounter (Signed)
Called and left VM for pt to schedule an appt.  Patient Name: Tracey Saunders Gender: Female DOB: [birth year] Age: 66 Y 2 M 29 D Return Phone Number: [phone] (Primary) Address: City/ State/ Zip: [place]  [address] Client [facility] Client Site [facility] [facility] [employer] Type Call Who Is Calling Patient / Member / Family / Caregiver Call Type Triage / Clinical Relationship To Patient Self Return Phone Number [phone] (Primary) Chief Complaint Blood Pressure High Reason for Call Symptomatic / Request for [place] states her Blood pressure is 140/86 and severe headache Translation No Nurse Assessment Nurse: Glean Salvo, RN, Ebone Date/Time (Eastern Time): 08/23/2021 5:26:29 PM Confirm and document reason for call. If symptomatic, describe symptoms. ---Caller states her Blood pressure is 137/86 and severe Headache. Current BP 155/49. 141/73 Right arm. Pantoprazole, Omeprazole, Hydrochlorothiazide 12.5 mg. Atorvastatin, Melatonin, Sertraline, Prednisone, Gabapentin. Denies fever Does the patient have any new or worsening symptoms? ---Yes Will a triage be completed? ---Yes Related visit to physician within the last 2 weeks? ---No Does the PT have any chronic conditions? (i.e. diabetes, asthma, this includes High risk factors for pregnancy, etc.) ---Yes List chronic conditions. ---Nerve pain. Anxiety, Depression, Is this a behavioral health or substance abuse call? ---No Guidelines Guideline Title Affirmed Question Affirmed Notes Nurse Date/Time (Eastern Time) Blood Pressure - High [2] Systolic BP >= 952 OR Diastolic >= 80 AND [8] taking BP medications Walker-Foster, RN, Ebone 08/23/2021 5:35:54 PM Headache Stiff neck (can't touch chin to chest) Walker-Foster, RN, Hayden Rasmussen 08/23/2021 5:39:57 PM Disp. Time Eilene Ghazi Time) Disposition Final  User 08/23/2021 5:39:40 PM See PCP within 2 Weeks Walker-Foster, RN, Hayden Rasmussen 08/23/2021 5:41:36 PM Go to ED Now Yes Glean Salvo, RN, Hayden Rasmussen Caller Disagree/Comply Disagree Caller Understands Yes PreDisposition InappropriateToAsk Care Advice Given Per Guideline SEE PCP WITHIN 2 WEEKS: REASSURANCE AND EDUCATION: HIGH BLOOD PRESSURE MEDICINES: * You need to be seen for this ongoing problem within the next 2 weeks. CALL BACK IF: * Your blood pressure is over 160/100 * You become worse CARE ADVICE given per High Blood Pressure (Adult) guideline. GO TO ED NOW: * You need to be seen in the Emergency Department. CARE ADVICE given per Headache (Adult) guideline. Referrals GO TO FACILITY REFUSED

## 2021-08-24 NOTE — Telephone Encounter (Signed)
Patient notified and verbalized understanding. 

## 2021-08-25 ENCOUNTER — Telehealth: Payer: Self-pay

## 2021-08-25 NOTE — Telephone Encounter (Addendum)
error 

## 2021-08-25 NOTE — Telephone Encounter (Signed)
Noted. Please see note below.

## 2021-08-25 NOTE — Telephone Encounter (Signed)
Patient called stating she has a high bp of 148/82.  States she has had a bad headache for several days and she can not get it to go away.  States its hard for her to function.  I have offered to triage patient.  She refused.  Patient has scheduled appt for tomorrow 2/16 with [facility].

## 2021-08-25 NOTE — Telephone Encounter (Incomplete Revision)
error 

## 2021-08-26 ENCOUNTER — Ambulatory Visit (INDEPENDENT_AMBULATORY_CARE_PROVIDER_SITE_OTHER): Payer: Medicare HMO | Admitting: Family Medicine

## 2021-08-26 ENCOUNTER — Encounter: Payer: Self-pay | Admitting: Family Medicine

## 2021-08-26 ENCOUNTER — Other Ambulatory Visit: Payer: Self-pay

## 2021-08-26 VITALS — BP 148/70 | HR 79 | Temp 98.2°F | Ht 61.0 in | Wt 143.6 lb

## 2021-08-26 DIAGNOSIS — R519 Headache, unspecified: Secondary | ICD-10-CM

## 2021-08-26 DIAGNOSIS — E538 Deficiency of other specified B group vitamins: Secondary | ICD-10-CM | POA: Diagnosis not present

## 2021-08-26 DIAGNOSIS — I10 Essential (primary) hypertension: Secondary | ICD-10-CM

## 2021-08-26 LAB — BASIC METABOLIC PANEL
BUN: 10 mg/dL (ref 6–23)
CO2: 33 mEq/L — ABNORMAL HIGH (ref 19–32)
Calcium: 9.5 mg/dL (ref 8.4–10.5)
Chloride: 99 mEq/L (ref 96–112)
Creatinine, Ser: 0.81 mg/dL (ref 0.40–1.20)
GFR: 75.2 mL/min (ref >60.00)
Glucose, Bld: 99 mg/dL (ref 70–99)
Potassium: 4 mEq/L (ref 3.5–5.1)
Sodium: 136 mEq/L (ref 135–145)

## 2021-08-26 LAB — TSH: TSH: 3.04 u[IU]/mL (ref 0.35–5.50)

## 2021-08-26 MED ORDER — KETOROLAC TROMETHAMINE 60 MG/2ML IM SOLN
60.0000 mg | Freq: Once | INTRAMUSCULAR | Status: AC
  Administered 2021-08-26: 60 mg via INTRAMUSCULAR

## 2021-08-26 MED ORDER — CYANOCOBALAMIN 1000 MCG/ML IJ SOLN
1000.0000 ug | Freq: Once | INTRAMUSCULAR | Status: AC
  Administered 2021-08-26: 1000 ug via INTRAMUSCULAR

## 2021-08-26 NOTE — Patient Instructions (Signed)
It was very nice to see you today!  Monitor blood pressures.   PLEASE NOTE:  If you had any lab tests please let us know if you have not heard back within a few days. You may see your results on MyChart before we have a chance to review them but we will give you a call once they are reviewed by [place]. If we ordered any referrals today, please let us know if you have not heard from their office within the next week.   Please try these tips to maintain a healthy lifestyle:  Eat most of your calories during the day when you are active. Eliminate processed foods including packaged sweets (pies, cakes, cookies), reduce intake of potatoes, white bread, white pasta, and white rice. Look for whole grain options, oat flour or almond flour.  Each meal should contain half fruits/vegetables, one quarter protein, and one quarter carbs (no bigger than a computer mouse).  Cut down on sweet beverages. This includes juice, soda, and sweet tea. Also watch fruit intake, though this is a healthier sweet option, it still contains natural sugar! Limit to 3 servings daily.  Drink at least 1 glass of water with each meal and aim for at least 8 glasses per day  Exercise at least 150 minutes every week.

## 2021-08-26 NOTE — Telephone Encounter (Signed)
Patient discussed concerns at office visit with Dr. Cherlynn Kaiser on 08/26/2021.

## 2021-08-26 NOTE — Progress Notes (Signed)
Subjective:     Patient ID: Tracey Saunders, female    DOB: Nov 23, 1955, 66 y.o.   MRN: [id number]  Chief Complaint  Patient presents with   Hypertension    Pt has reading recorded from the last 2 day:  Between 129/64-169/97    HPI HTN-bp's ranging 129-169/64-97.  Having HA.  Was on HCTZ-just added amlodipine-started yesterday.  "Bp's hard to control and always needs more than 1 med".  HA incapacitated.  Long h/o HTN-"usu needs 2 meds".  Was on losartan but off since angioedema/mouth burning.  NAS and hates salt so won't do anyway  B12  deficiency-tabs not help-needs injections but can't come weekly-can get here monthly  R leg hurting more-taking gabapentin Occ dizziness.  Mouth burning from ceyanne pepper-had been doing a lot.  She stopped it and burning/swelling resolved.  Health Maintenance Due  Topic Date Due   Hepatitis C Screening  Never done   MAMMOGRAM  Never done   Zoster Vaccines- Shingrix (1 of 2) Never done   DEXA SCAN  Never done   COVID-19 Vaccine (3 - Booster for [employer] series) 02/12/2020    Past Medical History:  Diagnosis Date   Anxiety disorder 09/09/2019   Cancer ([place])    cervical cancer   Depression    Diverticulitis    Fatty liver    GERD (gastroesophageal reflux disease)    Hyperlipidemia    Hypertension    Insomnia 09/09/2019   Liver cyst    Vitamin D deficiency     Past Surgical History:  Procedure Laterality Date   ABDOMINAL HYSTERECTOMY     COLON RESECTION     due to diverticulitis    Outpatient Medications Prior to Visit  Medication Sig Dispense Refill   amLODipine (NORVASC) 5 MG tablet Take 1 tablet (5 mg total) by mouth daily. 90 tablet 0   montelukast (SINGULAIR) 10 MG tablet TAKE 1 TABLET BY MOUTH EVERYDAY AT BEDTIME 90 tablet 3   omeprazole (PRILOSEC) 40 MG capsule Take 40 mg by mouth daily.     sertraline (ZOLOFT) 50 MG tablet Take 1 tablet (50 mg total) by mouth at bedtime. Office visit for further refills 90 tablet 0    traZODone (DESYREL) 50 MG tablet Take 2 tablets (100 mg total) by mouth at bedtime as needed for sleep. 180 tablet 1   triamcinolone ointment (KENALOG) 0.1 % Apply 1 application topically 2 (two) times daily. 30 g 0   albuterol (VENTOLIN HFA) 108 (90 Base) MCG/ACT inhaler Inhale 2 puffs into the lungs every 6 (six) hours as needed for wheezing or shortness of breath. (Patient not taking: Reported on 08/26/2021) 1 each 2   atorvastatin (LIPITOR) 20 MG tablet Take 1 tablet (20 mg total) by mouth daily. (Patient not taking: Reported on 08/26/2021) 90 tablet 1   dicyclomine (BENTYL) 10 MG capsule Take 1 capsule (10 mg total) by mouth 4 (four) times daily -  before meals and at bedtime. 120 capsule 0   gabapentin (NEURONTIN) 300 MG capsule Take 1 capsule (300 mg total) by mouth daily. 90 capsule 1   hydrochlorothiazide (HYDRODIURIL) 12.5 MG tablet Take 1 tablet (12.5 mg total) by mouth daily. 90 tablet 0   loratadine (CLARITIN) 10 MG tablet Take 1 tablet (10 mg total) by mouth daily. (Patient not taking: Reported on 08/26/2021) 90 tablet 1   pantoprazole (PROTONIX) 20 MG tablet Take 1 tablet (20 mg total) by mouth daily. 90 tablet 1   vitamin B-12 (CYANOCOBALAMIN) 1000 MCG tablet  Take 1 tablet (1,000 mcg total) by mouth daily. (Patient not taking: Reported on 08/26/2021) 90 tablet 1   No facility-administered medications prior to visit.    Allergies  Allergen Reactions   Aleve-D Sinus & Cold [Pseudoephedrine-Naproxen Na Er]    [id number] except as noted in HPI      Objective:     BP (!) 148/70    Pulse 79    Temp 98.2 F (36.8 C)    Ht 5\' 1"  (1.549 m)    Wt 143 lb 9.6 oz (65.1 kg)    SpO2 98%    BMI 27.13 kg/m  Wt Readings from Last 3 Encounters:  08/26/21 143 lb 9.6 oz (65.1 kg)  07/27/21 144 lb (65.3 kg)  07/09/21 144 lb 5 oz (65.5 kg)        Gen: WDWN NAD wf HEENT: NCAT, conjunctiva not injected, sclera nonicteric. eomi NECK:  supple, no thyromegaly, no nodes, no  carotid bruits CARDIAC: RRR, S1S2+, no murmur. DP 2+B LUNGS: CTAB. No wheezes EXT:  no edema MSK: no gross abnormalities. Using quad cane NEURO: A&O x3.  CN II-XII intact.  PSYCH: normal mood. Good eye contact  Assessment & Plan:   Problem List Items Addressed This Visit       Cardiovascular and Mediastinum   Essential hypertension   Relevant Orders   Basic metabolic panel   TSH     Other   Vitamin B12 deficiency - Primary   Other Visit Diagnoses     Intractable episodic headache, unspecified headache type       Relevant Medications   ketorolac (TORADOL) injection 60 mg (Completed)   Other Relevant Orders   Basic metabolic panel   TSH      HTN-not controlled.  Can't increase hctz d/t hyponatremia.  Just started amlodipine.  Check bmp and tsh.  Monitor bp's HA-pt feels from bp-just started amlodipine.  Toradol given in office.  Monitor B12 deficiency-no change w/PO.  Will do IM monthly(can't do weekly x 4 d/t xportation)  Meds ordered this encounter  Medications   cyanocobalamin ((VITAMIN B-12)) injection 1,000 mcg   ketorolac (TORADOL) injection 60 mg    Wellington Hampshire, MD

## 2021-08-26 NOTE — Progress Notes (Signed)
Pt's BP reading last 2 days: 169/97 156/86- after lunch 129/64 148/82 150/83 134/93- before bed

## 2021-08-30 ENCOUNTER — Other Ambulatory Visit: Payer: Self-pay | Admitting: *Deleted

## 2021-08-30 ENCOUNTER — Telehealth: Payer: Self-pay

## 2021-08-30 ENCOUNTER — Other Ambulatory Visit: Payer: Self-pay

## 2021-08-30 ENCOUNTER — Other Ambulatory Visit: Payer: Self-pay | Admitting: Family Medicine

## 2021-08-30 MED ORDER — HYDROCHLOROTHIAZIDE 12.5 MG PO TABS: 12.5000 mg | ORAL_TABLET | Freq: Every day | ORAL | 0 refills | Status: DC

## 2021-08-30 MED ORDER — TRAMADOL HCL 50 MG PO TABS
50.0000 mg | ORAL_TABLET | Freq: Four times a day (QID) | ORAL | 0 refills | Status: AC | PRN
Start: 2021-08-30 — End: 2021-09-04

## 2021-08-30 NOTE — Addendum Note (Signed)
Addended by: Wellington Hampshire on: 08/30/2021 01:42 PM   Modules accepted: Orders

## 2021-08-30 NOTE — Telephone Encounter (Signed)
..   Encourage patient to contact the pharmacy for refills or they can request refills through [place]: 08/26/2021  NEXT APPOINTMENT DATE: 09/07/2021  MEDICATION: hydrochlorothiazide  Is the patient out of medication?   PHARMACY:  [facility] [place]   Let patient know to contact pharmacy at the end of the day to make sure medication is ready.  Please notify patient to allow 48-72 hours to process  CLINICAL FILLS OUT ALL BELOW:   LAST REFILL:  QTY:  REFILL DATE:    OTHER COMMENTS:    Okay for refill?  Please advise

## 2021-08-30 NOTE — Telephone Encounter (Signed)
Rx sent to the pharmacy.

## 2021-08-30 NOTE — Telephone Encounter (Signed)
Patient stated that she has been taking amlodipine. Patient she stated that she has been having a severe headache that she cannot get rid of. Patient wanted to know if there is something that she can get for the severe headache.

## 2021-08-30 NOTE — Telephone Encounter (Signed)
Patient has called stating her BP is running high and she is having headaches.    States this is something that has been going on for awhile.  Refused triage.  States she has transportation issues and can not come in this week.   Patient has scheduled an appt for 2/28.    Is requesting call back in order to try to get a new medication to treat sent to her pharmacy.

## 2021-08-30 NOTE — Telephone Encounter (Signed)
Patient notified and verbalized understanding.   Patient stated that her b/p numbers are:   today at 2:20 pm: 145/74 Earlier today: 146/91 Later yesterday: 128/81 Earlier yesterday: 154/87 Feb 18th at 9:22 pm: 87/71

## 2021-09-07 ENCOUNTER — Ambulatory Visit (INDEPENDENT_AMBULATORY_CARE_PROVIDER_SITE_OTHER): Payer: Medicare HMO | Admitting: Family Medicine

## 2021-09-07 ENCOUNTER — Other Ambulatory Visit: Payer: Self-pay

## 2021-09-07 ENCOUNTER — Encounter: Payer: Self-pay | Admitting: Family Medicine

## 2021-09-07 VITALS — BP 150/70 | HR 68 | Temp 98.0°F | Ht 61.0 in | Wt 141.4 lb

## 2021-09-07 DIAGNOSIS — M542 Cervicalgia: Secondary | ICD-10-CM | POA: Diagnosis not present

## 2021-09-07 DIAGNOSIS — E538 Deficiency of other specified B group vitamins: Secondary | ICD-10-CM | POA: Diagnosis not present

## 2021-09-07 DIAGNOSIS — I10 Essential (primary) hypertension: Secondary | ICD-10-CM | POA: Diagnosis not present

## 2021-09-07 DIAGNOSIS — M5441 Lumbago with sciatica, right side: Secondary | ICD-10-CM | POA: Diagnosis not present

## 2021-09-07 DIAGNOSIS — G4440 Drug-induced headache, not elsewhere classified, not intractable: Secondary | ICD-10-CM | POA: Diagnosis not present

## 2021-09-07 DIAGNOSIS — G8929 Other chronic pain: Secondary | ICD-10-CM

## 2021-09-07 MED ORDER — CYANOCOBALAMIN 1000 MCG/ML IJ SOLN
1000.0000 ug | Freq: Once | INTRAMUSCULAR | Status: AC
  Administered 2021-09-07: 1000 ug via INTRAMUSCULAR

## 2021-09-07 MED ORDER — AMLODIPINE BESYLATE 10 MG PO TABS: 10.0000 mg | ORAL_TABLET | Freq: Every day | ORAL | 1 refills | Status: DC

## 2021-09-07 MED ORDER — GABAPENTIN 300 MG PO CAPS
300.0000 mg | ORAL_CAPSULE | Freq: Three times a day (TID) | ORAL | 1 refills | Status: DC
Start: 2021-09-07 — End: 2021-11-22

## 2021-09-07 MED ORDER — KETOROLAC TROMETHAMINE 60 MG/2ML IM SOLN
60.0000 mg | Freq: Once | INTRAMUSCULAR | Status: AC
  Administered 2021-09-07: 60 mg via INTRAMUSCULAR

## 2021-09-07 NOTE — Patient Instructions (Addendum)
It was very nice to see you today!  Gabapentin 3x/day.  Tylenol three times/day.   Increase amlodipine to 10mg -let me know if increased swelling  PLEASE NOTE:  If you had any lab tests please let us know if you have not heard back within a few days. You may see your results on MyChart before we have a chance to review them but we will give you a call once they are reviewed by [place]. If we ordered any referrals today, please let us know if you have not heard from their office within the next week.   Please try these tips to maintain a healthy lifestyle:  Eat most of your calories during the day when you are active. Eliminate processed foods including packaged sweets (pies, cakes, cookies), reduce intake of potatoes, white bread, white pasta, and white rice. Look for whole grain options, oat flour or almond flour.  Each meal should contain half fruits/vegetables, one quarter protein, and one quarter carbs (no bigger than a computer mouse).  Cut down on sweet beverages. This includes juice, soda, and sweet tea. Also watch fruit intake, though this is a healthier sweet option, it still contains natural sugar! Limit to 3 servings daily.  Drink at least 1 glass of water with each meal and aim for at least 8 glasses per day  Exercise at least 150 minutes every week.

## 2021-09-07 NOTE — Progress Notes (Signed)
1  Subjective:     Patient ID: Tracey Saunders, female    DOB: [birth year], 66 y.o.   MRN: [id number]  Chief Complaint  Patient presents with   Hypertension    Still having elevated blood pressures at times   Headache    Still constant headaches Requesting an x-ray of neck due to pain    HPI 1  HTN-taking amlodipine/hctz.  Running 140's/70-90 in am.  Later in day can be 110-120's/70's.  Occ 170-180.  Feels tired when bps low.  +HA.  No cp/sob/new cough/edema.  Not very active. Bp increases when more active.  This wk, had some R lip swell one day.  2.  HA and constant neck pain-worse to turn it and "pops".  Hard to lean head back.  No rad. No n/t arms.  3.  Pain legs/feet all the time.  Bad back  R leg in severe pain all the time and feels "heavy".  Was told d/t arthritis.  Using cane.  Does walk dog. Injrctions in spine not last.   Health Maintenance Due  Topic Date Due   Hepatitis C Screening  Never done   MAMMOGRAM  Never done   Zoster Vaccines- Shingrix (1 of 2) Never done   DEXA SCAN  Never done   COVID-19 Vaccine (3 - Booster for [employer] series) 02/12/2020    Past Medical History:  Diagnosis Date   Anxiety disorder 09/09/2019   Cancer ([place])    cervical cancer   Depression    Diverticulitis    Fatty liver    GERD (gastroesophageal reflux disease)    Hyperlipidemia    Hypertension    Insomnia 09/09/2019   Liver cyst    Vitamin D deficiency     Past Surgical History:  Procedure Laterality Date   ABDOMINAL HYSTERECTOMY     COLON RESECTION     due to diverticulitis    Outpatient Medications Prior to Visit  Medication Sig Dispense Refill   albuterol (VENTOLIN HFA) 108 (90 Base) MCG/ACT inhaler Inhale 2 puffs into the lungs every 6 (six) hours as needed for wheezing or shortness of breath. 1 each 2   atorvastatin (LIPITOR) 20 MG tablet Take 1 tablet (20 mg total) by mouth daily. 90 tablet 1   hydrochlorothiazide (HYDRODIURIL) 12.5 MG tablet Take 1 tablet (12.5 mg total)  by mouth daily. 90 tablet 0   loratadine (CLARITIN) 10 MG tablet Take 1 tablet (10 mg total) by mouth daily. 90 tablet 1   montelukast (SINGULAIR) 10 MG tablet TAKE 1 TABLET BY MOUTH EVERYDAY AT BEDTIME 90 tablet 3   omeprazole (PRILOSEC) 40 MG capsule Take 40 mg by mouth daily.     sertraline (ZOLOFT) 50 MG tablet Take 1 tablet (50 mg total) by mouth at bedtime. Office visit for further refills 90 tablet 0   traZODone (DESYREL) 50 MG tablet Take 2 tablets (100 mg total) by mouth at bedtime as needed for sleep. 180 tablet 1   triamcinolone ointment (KENALOG) 0.1 % Apply 1 application topically 2 (two) times daily. 30 g 0   amLODipine (NORVASC) 5 MG tablet Take 1 tablet (5 mg total) by mouth daily. 90 tablet 0   dicyclomine (BENTYL) 10 MG capsule Take 1 capsule (10 mg total) by mouth 4 (four) times daily -  before meals and at bedtime. 120 capsule 0   pantoprazole (PROTONIX) 20 MG tablet Take 1 tablet (20 mg total) by mouth daily. 90 tablet 1   gabapentin (NEURONTIN) 300 MG capsule Take  1 capsule (300 mg total) by mouth daily. 90 capsule 1   No facility-administered medications prior to visit.    Allergies  Allergen Reactions   Aleve-D Sinus & Cold [Pseudoephedrine-Naproxen Na Er]    ROS neg/noncontributory except as noted HPI/below      Objective:     BP (!) 150/70    Pulse 68    Temp 98 F (36.7 C) (Temporal)    Ht 5\' 1"  (1.549 m)    Wt 141 lb 6 oz (64.1 kg)    SpO2 99%    BMI 26.71 kg/m  Wt Readings from Last 3 Encounters:  09/07/21 141 lb 6 oz (64.1 kg)  08/26/21 143 lb 9.6 oz (65.1 kg)  07/27/21 144 lb (65.3 kg)        Gen: WDWN NAD HEENT: NCAT, conjunctiva not injected, sclera nonicteric NECK:  supple, no thyromegaly, no nodes, no carotid bruits CARDIAC: RRR, S1S2+, no murmur. DP 2+B LUNGS: CTAB. No wheezes ABDOMEN:  BS+, soft, NTND, No HSM, no masses EXT:  no edema MSK: no gross abnormalities. Has cane.  MS 5/5 BUE.  Some tenderness high c spine.  NEURO: A&O x3.  CN  II-XII intact.  PSYCH: normal mood. Good eye contact  Assessment & Plan:   Problem List Items Addressed This Visit       Cardiovascular and Mediastinum   Essential hypertension - Primary   Relevant Medications   amLODipine (NORVASC) 10 MG tablet     Other   Vitamin B12 deficiency   Headache, drug induced   Relevant Medications   gabapentin (NEURONTIN) 300 MG capsule   amLODipine (NORVASC) 10 MG tablet   Other Visit Diagnoses     Cervicalgia       Relevant Medications   ketorolac (TORADOL) injection 60 mg (Completed)   Other Relevant Orders   DG Cervical Spine Complete      HTN-not well controlled.  Increase amlodipine to 10mg .  If edema, call HA-?from elevated bp, ? From cervicalgia-requesting toradol.  Will check cxpine Cervicalgia/back pain-inc gabapentin to tid, tylenol tid,  check x-ray neck B12 deficiency-will give today  Meds ordered this encounter  Medications   gabapentin (NEURONTIN) 300 MG capsule    Sig: Take 1 capsule (300 mg total) by mouth 3 (three) times daily.    Dispense:  270 capsule    Refill:  1   ketorolac (TORADOL) injection 60 mg   cyanocobalamin ((VITAMIN B-12)) injection 1,000 mcg   amLODipine (NORVASC) 10 MG tablet    Sig: Take 1 tablet (10 mg total) by mouth daily.    Dispense:  90 tablet    Refill:  1    Wellington Hampshire, MD

## 2021-09-15 ENCOUNTER — Telehealth: Payer: Self-pay | Admitting: Family Medicine

## 2021-09-15 NOTE — Telephone Encounter (Signed)
Patient notified and verbalized understanding. Patient would like x-ray of lumbar added.  ?

## 2021-09-15 NOTE — Telephone Encounter (Signed)
Patient called wanting to speak with Dr Kathrin Ruddy MA re back and neck pain- Call was sent to Q at ext [phone].  ?

## 2021-09-15 NOTE — Addendum Note (Signed)
Addended by: Wellington Hampshire on: 09/15/2021 01:43 PM   Modules accepted: Orders

## 2021-09-15 NOTE — Telephone Encounter (Signed)
Spoke to patient and she wanted to make sure that the x-ray she was ordered was one that would show everything due to her chronic back and neck pain. She stated that she wanted to make sure that the x-ray did not miss anything.  ?

## 2021-09-17 ENCOUNTER — Other Ambulatory Visit: Payer: Self-pay

## 2021-09-17 ENCOUNTER — Ambulatory Visit (INDEPENDENT_AMBULATORY_CARE_PROVIDER_SITE_OTHER)
Admission: RE | Admit: 2021-09-17 | Discharge: 2021-09-17 | Disposition: A | Discharge status: Home / Self Care | Payer: Medicare HMO | Source: Ambulatory Visit | Attending: Family Medicine | Admitting: Family Medicine

## 2021-09-17 DIAGNOSIS — M545 Low back pain, unspecified: Secondary | ICD-10-CM | POA: Diagnosis not present

## 2021-09-17 DIAGNOSIS — M5441 Lumbago with sciatica, right side: Secondary | ICD-10-CM | POA: Diagnosis not present

## 2021-09-17 DIAGNOSIS — G8929 Other chronic pain: Secondary | ICD-10-CM | POA: Diagnosis not present

## 2021-09-17 DIAGNOSIS — M542 Cervicalgia: Secondary | ICD-10-CM

## 2021-09-20 ENCOUNTER — Telehealth: Payer: Self-pay | Admitting: Family Medicine

## 2021-09-20 NOTE — Telephone Encounter (Signed)
Pt is wanting to talk to someone regarding her xray results.  ?

## 2021-09-20 NOTE — Telephone Encounter (Signed)
Patient contacted and given lab results and recommendations. Patient stated understanding and did not have any questions or concerns at this time.  ? ?

## 2021-09-27 ENCOUNTER — Other Ambulatory Visit: Payer: Self-pay

## 2021-09-27 ENCOUNTER — Telehealth: Payer: Self-pay | Admitting: Family Medicine

## 2021-09-27 ENCOUNTER — Telehealth: Payer: Self-pay | Admitting: Physician Assistant

## 2021-09-27 ENCOUNTER — Ambulatory Visit: Payer: Medicare HMO | Admitting: Physician Assistant

## 2021-09-27 ENCOUNTER — Other Ambulatory Visit: Payer: Self-pay | Admitting: *Deleted

## 2021-09-27 ENCOUNTER — Encounter: Payer: Self-pay | Admitting: Physician Assistant

## 2021-09-27 DIAGNOSIS — M542 Cervicalgia: Secondary | ICD-10-CM

## 2021-09-27 DIAGNOSIS — M5416 Radiculopathy, lumbar region: Secondary | ICD-10-CM | POA: Diagnosis not present

## 2021-09-27 MED ORDER — TIZANIDINE HCL 2 MG PO CAPS: 2.0000 mg | ORAL_CAPSULE | Freq: Three times a day (TID) | ORAL | 0 refills | Status: DC

## 2021-09-27 MED ORDER — METHYLPREDNISOLONE 4 MG PO TABS: ORAL_TABLET | ORAL | 0 refills | Status: DC

## 2021-09-27 MED ORDER — FLUTICASONE PROPIONATE 50 MCG/ACT NA SUSP: 2.0000 | Freq: Every day | NASAL | 1 refills | Status: DC

## 2021-09-27 NOTE — Telephone Encounter (Signed)
.. ?  Encourage patient to contact the pharmacy for refills or they can request refills through [facility] ? ?LAST APPOINTMENT DATE:  Please schedule appointment if longer than 1 year ? ?NEXT APPOINTMENT DATE: 4/ 10/23  ? ?MEDICATION: flonase nasal spray ? ?Is the patient out of medication? Yes  ? ?PHARMACY:  [facility] ([place]), [place] - [address] ? ?Let patient know to contact pharmacy at the end of the day to make sure medication is ready. ? ?Please notify patient to allow 48-72 hours to process  ?

## 2021-09-27 NOTE — Telephone Encounter (Signed)
Please advise 

## 2021-09-27 NOTE — Progress Notes (Addendum)
? ?Office Visit Note ?  ?Patient: Tracey Saunders           ?Date of Birth: Jan 18, 1956           ?MRN: [id number] ?Visit Date: 09/27/2021 ?             ?Requested by: Tawnya Crook, MD ?[address] ?[place],  [place] [address] ?PCP: Tawnya Crook, MD ? ? ?Assessment & Plan: ?Visit Diagnoses:  ?1. Lumbar radicular pain   ?2. Cervicalgia   ? ? ?Plan:  ?Given the patient's failure of conservative treatment and ongoing low back pain with radicular symptoms down the right leg recommend MRI to rule out HNP as the source of her low back pain.  Regards to her neck she has had no formal treatment would recommend physical therapy.  We will also place her on a Medrol Dosepak and Zanaflex mainly to take Zanaflex at night.  No NSAIDs while she is on the Medrol Dosepak.  Questions were encouraged and answered at length.  She will follow-up with [place] after the MRI to go over results and discuss further treatment. ? ?Follow-Up Instructions: Return After MRI.  ? ?Orders:  ?No orders of the defined types were placed in this encounter. ? ?Meds ordered this encounter  ?Medications  ? methylPREDNISolone (MEDROL) 4 MG tablet  ?  Sig: Take as directed  ?  Dispense:  21 tablet  ?  Refill:  0  ? tizanidine (ZANAFLEX) 2 MG capsule  ?  Sig: Take 1 capsule (2 mg total) by mouth 3 (three) times daily.  ?  Dispense:  30 capsule  ?  Refill:  0  ? ? ? ? Procedures: ?No procedures performed ? ? ?Clinical Data: ?No additional findings. ? ? ?Subjective: ?Chief Complaint  ?Patient presents with  ? Neck - Pain  ? Lower Back - Pain  ? ? ?HPI ?Ms. Tracey Saunders is 66 year old female comes in today with neck and low back pain.  She states she has had low back pain that is been ongoing for over a year.  She notes that her right leg "feels heavy and dead".  She does take Neurontin which eases some of the pain.  She has had an epidural steroid injection by Dr. Herma Mering in February which gave her about a month of some relief.  She denies any bowel or bladder  dysfunction saddle anesthesia like symptoms.  She states both the neck and back pain awaken her at night.  She has to use a cane to ambulate due to the weakness in her right leg. ? In regards to the neck pain spine ongoing for approximately year also.  No treatment.  She has pain that radiates from her neck down to the right wrist.  She has soreness in both arms. ?Cervical spine films are reviewed.  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  C4-C5 and C5-C6 shows moderate to severe stenosis on the right.  No acute fractures.  Disc space loss at C4-C5 and C 5 and C6. ?Lumbar spine films are reviewed and show mild to moderate degenerative changes lumbar spine.  This space overall well-maintained.  Arthrosclerosis of the aorta noted. ? ?Review of Systems ?Negative for fevers or chills.  Please see HPI otherwise negative or noncontributory. ? ?Objective: ?Vital Signs: There were no vitals taken for this visit. ? ?Physical Exam ?General well-developed well-nourished female no acute distress ambulates with a cane.  She will get on and off the exam table  on her own with an antalgic gait. ?Ortho Exam ?Cervical spine she has full flexion extension.  Positive Spurling's.  Tenderness over the lower cervical spinal column no tenderness over the medial borders of scapula bilaterally.  Upper extremities 5 out of 5 strength throughout.  Subjectives full sensation bilateral hands to light touch.  Radial pulses are 2+ and equal and symmetric. ?Bilateral lower extremities 5 out of 5 strength throughout against resistance.  Positive straight leg raise on the right negative on the left.  Tenderness over the lower lumbar spine.  Good range of motion bilateral hips without pain.  Dorsal pedal pulses are 2+ bilaterally equal symmetric.  Sensation grossly intact bilateral feet light touch. ? ?Specialty Comments:  ?No specialty comments available. ? ?Imaging: ?No results found. ? ? ?PMFS History: ?Patient Active  Problem List  ? Diagnosis Date Noted  ? Chronic right-sided low back pain with sciatica 06/22/2020  ? 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 Headache, drug induced 03/12/2020  ? Hair thinning 11/12/2019  ? Recurrent major depressive disorder, in full remission ([place]) 11/04/2019  ? Gastroesophageal reflux disease 09/09/2019  ? Insomnia 09/09/2019  ? Anxiety disorder 09/09/2019  ? Essential hypertension 06/03/2019  ? Hyperlipidemia 06/03/2019  ? History of cancer 04/29/2019  ? Vitamin B12 deficiency 04/29/2019  ? Vitamin D deficiency 04/29/2019  ? ?Past Medical History:  ?Diagnosis Date  ? Anxiety disorder 09/09/2019  ? Cancer [facility])   ? cervical cancer  ? Depression   ? Diverticulitis   ? Fatty liver   ? GERD (gastroesophageal reflux disease)   ? Hyperlipidemia   ? Hypertension   ? Insomnia 09/09/2019  ? Liver cyst   ? Vitamin D deficiency   ?  ?Family History  ?Problem Relation Age of Onset  ? Colon cancer Neg Hx   ? Esophageal cancer Neg Hx   ? Stomach cancer Neg Hx   ? Rectal cancer Neg Hx   ?  ?Past Surgical History:  ?Procedure Laterality Date  ? ABDOMINAL HYSTERECTOMY    ? COLON RESECTION    ? due to diverticulitis  ? ?Social History  ? ?Occupational History  ? Occupation: retired  ?Tobacco Use  ? Smoking status: Light Smoker  ?  Packs/day: 0.50  ?  Types: Cigarettes  ? Smokeless tobacco: Never  ?Vaping Use  ? Vaping Use: Never used  ?Substance and Sexual Activity  ? Alcohol use: Yes  ?  Comment: occasional beer or bloody mary once a week  ? Drug use: Never  ? Sexual activity: Not on file  ? ? ? ? ? ? ?

## 2021-09-27 NOTE — Telephone Encounter (Signed)
Pt just finished appt and is asking that medication that is supposed to be called in be sent to [place] [place] [facility] instead of [facility] as they do not have it in stock. The best call back number if needed is [phone].  ?

## 2021-09-27 NOTE — Telephone Encounter (Signed)
Rx sent to the pharmacy.

## 2021-09-28 ENCOUNTER — Telehealth: Payer: Self-pay | Admitting: Family Medicine

## 2021-09-28 ENCOUNTER — Other Ambulatory Visit: Payer: Self-pay | Admitting: Physician Assistant

## 2021-09-28 ENCOUNTER — Other Ambulatory Visit: Payer: Self-pay | Admitting: *Deleted

## 2021-09-28 MED ORDER — OMEPRAZOLE 40 MG PO CPDR: 40.0000 mg | DELAYED_RELEASE_CAPSULE | Freq: Every day | ORAL | 3 refills | Status: DC

## 2021-09-28 MED ORDER — METHYLPREDNISOLONE 4 MG PO TABS: ORAL_TABLET | ORAL | 0 refills | Status: DC

## 2021-09-28 MED ORDER — FLUTICASONE PROPIONATE 50 MCG/ACT NA SUSP: 2.0000 | Freq: Every day | NASAL | 1 refills | Status: DC

## 2021-09-28 NOTE — Addendum Note (Signed)
Addended by: Robyne Peers on: 09/28/2021 04:58 PM ? ? Modules accepted: Orders ? ?

## 2021-09-28 NOTE — Telephone Encounter (Signed)
Dianne Calling from [facility] requesting refill for: omeprazole (PRILOSEC) 40 MG capsule ? ?Please call [phone] Opt 2  ?Ref # [id number]  ?

## 2021-09-28 NOTE — Telephone Encounter (Signed)
Rx sent to the pharmacy.

## 2021-09-30 ENCOUNTER — Telehealth: Payer: Self-pay | Admitting: Physician Assistant

## 2021-09-30 NOTE — Telephone Encounter (Signed)
Called and verified rx with [facility] ?

## 2021-09-30 NOTE — Telephone Encounter (Signed)
Recd on 3-20 ? ?Needs verification on medication  ? ?Shelly - [phone] option 2 ? ?Ref # [id number] ?

## 2021-10-04 ENCOUNTER — Telehealth: Payer: Self-pay | Admitting: Family Medicine

## 2021-10-04 NOTE — Telephone Encounter (Signed)
Returned call to pharmacy, confirmed which medication patient is taking. Spoke with patient and she stated that she is taking omeprazole, no longer taking Protonix.  ?

## 2021-10-04 NOTE — Telephone Encounter (Signed)
Sorry routed this to wrong provider ?

## 2021-10-04 NOTE — Telephone Encounter (Signed)
Tracey Saunders with [facility] stated pt was previously prescribed Protonix by [place] and Dr Cherlynn Kaiser placed an order for Prilosec. They just need clarification as to which medication pt should be taking. Please call [phone], and option #2. Ref# [id number] ?

## 2021-10-10 ENCOUNTER — Other Ambulatory Visit: Payer: Medicare HMO

## 2021-10-11 ENCOUNTER — Telehealth: Payer: Self-pay | Admitting: Family Medicine

## 2021-10-11 NOTE — Telephone Encounter (Signed)
Noted  

## 2021-10-11 NOTE — Telephone Encounter (Signed)
Pt has an appt with Cherlynn Kaiser on 4/4 at 11:30 am ? ?Patient ?Name: ?Tracey THO ?Saunders ?Gender: Female ?DOB: 07-01-1956 ?Age: 66 Y 4 M 18 D ?Return ?Phone ?Number: ?[id number] ?(Primary) ?Address: ?City/ ?State/ ?Zip: ?[place] ? [address] ?Client [facility] [place] [facility] ?Clie ?Presenter, broadcasting at [place] [employer] ?Contact Type Call ?Who Is Calling Patient / Member / Family / Caregiver ?Call Type Triage / Clinical ?Relationship To Patient Self ?Return Phone Number [phone] (Primary) ?Chief Complaint Blood Pressure High ?Reason for Call Symptomatic / Request for Health Information ?Initial Comment Caller states her blood pressure is 166/96. She ?states she has a lump growing between her legs. It ?is sore. Provider is [employer]. ?Translation No ?Nurse Assessment ?Nurse: Ysidro Evert, RN, Levada Dy Date/Time Eilene Ghazi Time): 10/10/2021 11:31:49 AM ?Confirm and document reason for call. If ?symptomatic, describe symptoms. ?---Caller states her blood pressure is 155/92 currently. ?She states she has a bad headache and neck ache ?Does the patient have any new or worsening ?symptoms? ---Yes ?Will a triage be completed? ---Yes ?Related visit to physician within the last 2 weeks? ---No ?Does the PT have any chronic conditions? (i.e. ?diabetes, asthma, this includes High risk factors for ?pregnancy, etc.) ?---Yes ?List chronic conditions. ---hypertension, asthma ?Is this a behavioral health or substance abuse call? ---No ?Guidelines ?Guideline Title Affirmed Question Affirmed Notes Nurse Date/Time (Eastern ?Time) ?Blood Pressure - ?High ?[7] Systolic BP >= ?425 OR Diastolic >= ?80 AND [9] taking ?BP medications ?Ysidro Evert, RN, Levada Dy 10/10/2021 11:38:25 ?AM ?Disp. Time (Eastern ?Time) Disposition Final User ?10/10/2021 11:44:01 AM See PCP within 2 Weeks Yes Ysidro Evert, RN, Levada Dy ?Caller Disagree/Comply Comply ?Caller Understands Yes ?PreDisposition Did not know what to do ?Care Advice Given  Per Guideline ?SEE PCP WITHIN 2 WEEKS: * You need to be seen for this ongoing problem within the next 2 weeks. REASSURANCE AND ?EDUCATION: * Your blood pressure is elevated but you have told me that you are not having any symptoms. * You should see your ?doctor and have your blood pressure checked within 2 weeks. CARE ADVICE given per High Blood Pressure (Adult) guideline. ?* Weakness or numbness of the face, arm or leg on one side of the body occurs CALL BACK IF: * Difficulty walking, difficulty ?talking, or severe headache occurs * Chest pain or difficulty breathing occurs * Your blood pressure is over 160/100 * You become ?worse ?Referrals ?REFERRED TO PCP OFFICE ?

## 2021-10-12 ENCOUNTER — Encounter: Payer: Self-pay | Admitting: Family Medicine

## 2021-10-12 ENCOUNTER — Ambulatory Visit (INDEPENDENT_AMBULATORY_CARE_PROVIDER_SITE_OTHER): Payer: Medicare HMO | Admitting: Family Medicine

## 2021-10-12 VITALS — BP 130/86 | HR 73 | Temp 98.1°F | Ht 61.0 in | Wt 141.4 lb

## 2021-10-12 DIAGNOSIS — N9089 Other specified noninflammatory disorders of vulva and perineum: Secondary | ICD-10-CM | POA: Diagnosis not present

## 2021-10-12 DIAGNOSIS — G8929 Other chronic pain: Secondary | ICD-10-CM

## 2021-10-12 DIAGNOSIS — E538 Deficiency of other specified B group vitamins: Secondary | ICD-10-CM

## 2021-10-12 DIAGNOSIS — F5101 Primary insomnia: Secondary | ICD-10-CM | POA: Diagnosis not present

## 2021-10-12 DIAGNOSIS — R69 Illness, unspecified: Secondary | ICD-10-CM | POA: Diagnosis not present

## 2021-10-12 DIAGNOSIS — M5440 Lumbago with sciatica, unspecified side: Secondary | ICD-10-CM | POA: Diagnosis not present

## 2021-10-12 DIAGNOSIS — I10 Essential (primary) hypertension: Secondary | ICD-10-CM

## 2021-10-12 DIAGNOSIS — M542 Cervicalgia: Secondary | ICD-10-CM

## 2021-10-12 MED ORDER — LOSARTAN POTASSIUM 25 MG PO TABS: 25.0000 mg | ORAL_TABLET | Freq: Every day | ORAL | 1 refills | Status: DC

## 2021-10-12 MED ORDER — TRAZODONE HCL 50 MG PO TABS
100.0000 mg | ORAL_TABLET | Freq: Every evening | ORAL | 1 refills | Status: DC | PRN
Start: 2021-10-12 — End: 2022-05-18

## 2021-10-12 MED ORDER — CYANOCOBALAMIN 1000 MCG/ML IJ SOLN
1000.0000 ug | Freq: Once | INTRAMUSCULAR | Status: AC
  Administered 2021-10-12: 1000 ug via INTRAMUSCULAR

## 2021-10-12 NOTE — Progress Notes (Signed)
? ?Subjective:  ? ? ? Patient ID: Tracey Saunders, female    DOB: 12-Jan-1956, 66 y.o.   MRN: [id number] ? ?Chief Complaint  ?Patient presents with  ? Mass  ?  Small growth in vaginal area, noticed 4 days ago, causing some soreness  ? Follow-up  ?  Follow-up on HTN and headaches, need monthly B 12 ?Need refill of trazodone, completely out  ? ? ?HPI ? HTN-bp's up and down.  Can be 160's/90's.  Amlodipine '10mg'$  ?Chronic neck pain-going to PT-taking pain meds. ?Insomnia-working well fo sleep.  No SI.  Out of meds ?3 days ago-when wiped after urniation-felt lump. Soreness, but no d/c. ? ?Health Maintenance Due  ?Topic Date Due  ? Hepatitis C Screening  Never done  ? MAMMOGRAM  Never done  ? Zoster Vaccines- Shingrix (1 of 2) Never done  ? DEXA SCAN  Never done  ? ? ?Past Medical History:  ?Diagnosis Date  ? Anxiety disorder 09/09/2019  ? Cancer [facility])   ? cervical cancer  ? Depression   ? Diverticulitis   ? Fatty liver   ? GERD (gastroesophageal reflux disease)   ? Hyperlipidemia   ? Hypertension   ? Insomnia 09/09/2019  ? Liver cyst   ? Vitamin D deficiency   ? ? ?Past Surgical History:  ?Procedure Laterality Date  ? ABDOMINAL HYSTERECTOMY    ? COLON RESECTION    ? due to diverticulitis  ? ? ?Outpatient Medications Prior to Visit  ?Medication Sig Dispense Refill  ? albuterol (VENTOLIN HFA) 108 (90 Base) MCG/ACT inhaler Inhale 2 puffs into the lungs every 6 (six) hours as needed for wheezing or shortness of breath. 1 each 2  ? amLODipine (NORVASC) 10 MG tablet Take 1 tablet (10 mg total) by mouth daily. 90 tablet 1  ? atorvastatin (LIPITOR) 20 MG tablet Take 1 tablet (20 mg total) by mouth daily. 90 tablet 1  ? fluticasone (FLONASE) 50 MCG/ACT nasal spray Place 2 sprays into both nostrils daily. 16 g 1  ? gabapentin (NEURONTIN) 300 MG capsule Take 1 capsule (300 mg total) by mouth 3 (three) times daily. 270 capsule 1  ? hydrochlorothiazide (HYDRODIURIL) 12.5 MG tablet Take 1 tablet (12.5 mg total) by mouth daily. 90 tablet 0  ?  loratadine (CLARITIN) 10 MG tablet Take 1 tablet (10 mg total) by mouth daily. 90 tablet 1  ? montelukast (SINGULAIR) 10 MG tablet TAKE 1 TABLET BY MOUTH EVERYDAY AT BEDTIME 90 tablet 3  ? sertraline (ZOLOFT) 50 MG tablet Take 1 tablet (50 mg total) by mouth at bedtime. Office visit for further refills 90 tablet 0  ? tizanidine (ZANAFLEX) 2 MG capsule Take 1 capsule (2 mg total) by mouth 3 (three) times daily. 30 capsule 0  ? triamcinolone ointment (KENALOG) 0.1 % Apply 1 application topically 2 (two) times daily. 30 g 0  ? omeprazole (PRILOSEC) 40 MG capsule Take 1 capsule (40 mg total) by mouth daily. 90 capsule 3  ? traZODone (DESYREL) 50 MG tablet Take 2 tablets (100 mg total) by mouth at bedtime as needed for sleep. 180 tablet 1  ? dicyclomine (BENTYL) 10 MG capsule Take 1 capsule (10 mg total) by mouth 4 (four) times daily -  before meals and at bedtime. 120 capsule 0  ? pantoprazole (PROTONIX) 20 MG tablet Take 1 tablet (20 mg total) by mouth daily. 90 tablet 1  ? methylPREDNISolone (MEDROL) 4 MG tablet Take as directed. Dose pak six day course. 21 tablet 0  ? ?  No facility-administered medications prior to visit.  ? ? ?Allergies  ?Allergen Reactions  ? Aleve-D Sinus & Cold [Pseudoephedrine-Naproxen Na Er]   ? ?ROS neg/noncontributory except as noted HPI/below ? ? ?   ?Objective:  ?  ? ?BP 130/86   Pulse 73   Temp 98.1 ?F (36.7 ?C) (Temporal)   Ht '5\' 1"'$  (1.549 m)   Wt 141 lb 6 oz (64.1 kg)   SpO2 99%   BMI 26.71 kg/m?  ?Wt Readings from Last 3 Encounters:  ?10/12/21 141 lb 6 oz (64.1 kg)  ?09/07/21 141 lb 6 oz (64.1 kg)  ?08/26/21 143 lb 9.6 oz (65.1 kg)  ? ? ?Physical Exam  ? ?Gen: WDWN NAD ?HEENT: NCAT, conjunctiva not injected, sclera nonicteric ?NECK:  supple, no thyromegaly, no nodes, no carotid bruits ?CARDIAC: RRR, S1S2+, no murmur. DP 2+B ?LUNGS: CTAB. No wheezes ?ABDOMEN:  BS+, soft, NTND, No HSM, no masses ?EXT:  no edema ?MSK: no gross abnormalities.  ?NEURO: A&O x3.  CN II-XII intact.   ?PSYCH: normal mood. Good eye contact ?GU-chaperone QJ present-on R introitus-triangular, brown, "stuck on" lesion-approx 51m ? ? ?   ?Assessment & Plan:  ? ?Problem List Items Addressed This Visit   ? ?  ? Cardiovascular and Mediastinum  ? Essential hypertension - Primary  ? Relevant Medications  ? losartan (COZAAR) 25 MG tablet  ?  ? Nervous and Auditory  ? Chronic right-sided low back pain with sciatica  ? Relevant Medications  ? traZODone (DESYREL) 50 MG tablet  ?  ? Other  ? Vitamin B12 deficiency  ? Insomnia  ? ?Other Visit Diagnoses   ? ? Cervicalgia      ? Skin tag of labia      ? ?  ? HTN-chronic.  Not well controlled, but can be up and down-will re-try losartan but if "ANY issues w/lips/mouth stop.  Pt thought was d/t peppers in past.  If can't control, will refer Card(declines).  Monitor bp's.  F/u 1 mo ?Insomnia-chronic.  Well controlled on trazodone but issues getting from pharm-[facility] ?B12 deficiency-given today.  ?Cervicalgia/chronic LBP-seeing ortho.  Cont meds. ?Skin tag introitus-reassurance.  Monitor.  ? ?Meds ordered this encounter  ?Medications  ? cyanocobalamin ((VITAMIN B-12)) injection 1,000 mcg  ? traZODone (DESYREL) 50 MG tablet  ?  Sig: Take 2 tablets (100 mg total) by mouth at bedtime as needed for sleep.  ?  Dispense:  180 tablet  ?  Refill:  1  ? losartan (COZAAR) 25 MG tablet  ?  Sig: Take 1 tablet (25 mg total) by mouth daily.  ?  Dispense:  30 tablet  ?  Refill:  1  ? ? ?AWellington Hampshire MD ? ?

## 2021-10-12 NOTE — Patient Instructions (Signed)
[place] ? ?Start Losartan.  If ANY mouth problem starting, stop immediately.  Monitor bp's ?

## 2021-10-18 ENCOUNTER — Ambulatory Visit: Payer: Medicare HMO | Admitting: Family Medicine

## 2021-10-19 ENCOUNTER — Ambulatory Visit: Payer: Medicare HMO | Admitting: Physical Therapy

## 2021-10-19 ENCOUNTER — Encounter: Payer: Self-pay | Admitting: Physical Therapy

## 2021-10-19 DIAGNOSIS — M542 Cervicalgia: Secondary | ICD-10-CM

## 2021-10-19 DIAGNOSIS — M5459 Other low back pain: Secondary | ICD-10-CM

## 2021-10-19 DIAGNOSIS — M6281 Muscle weakness (generalized): Secondary | ICD-10-CM

## 2021-10-19 DIAGNOSIS — R262 Difficulty in walking, not elsewhere classified: Secondary | ICD-10-CM

## 2021-10-19 NOTE — Therapy (Signed)
?OUTPATIENT PHYSICAL THERAPY CERVICAL EVALUATION ? ? ?Patient Name: Tracey Saunders ?MRN: [id number] ?DOB:July 04, 1956, 66 y.o., female ?Today's Date: 10/19/2021 ? ? PT End of Session - 10/19/21 1351   ? ? Visit Number 1   ? Number of Visits 8   ? Date for PT Re-Evaluation 12/14/21   ? Authorization Type Aetna MCR   ? PT Start Time 1300   ? PT Stop Time 9924   ? PT Time Calculation (min) 47 min   ? Activity Tolerance Patient limited by pain   ? Behavior During Therapy [facility] for tasks assessed/performed   ? ?  ?  ? ?  ? ? ?Past Medical History:  ?Diagnosis Date  ? Anxiety disorder 09/09/2019  ? Cancer [facility])   ? cervical cancer  ? Depression   ? Diverticulitis   ? Fatty liver   ? GERD (gastroesophageal reflux disease)   ? Hyperlipidemia   ? Hypertension   ? Insomnia 09/09/2019  ? Liver cyst   ? Vitamin D deficiency   ? ?Past Surgical History:  ?Procedure Laterality Date  ? ABDOMINAL HYSTERECTOMY    ? COLON RESECTION    ? due to diverticulitis  ? ?Patient Active Problem List  ? Diagnosis Date Noted  ? Chronic right-sided low back pain with sciatica 06/22/2020  ? 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 Headache, drug induced 03/12/2020  ? Hair thinning 11/12/2019  ? Recurrent major depressive disorder, in full remission ([place]) 11/04/2019  ? Gastroesophageal reflux disease 09/09/2019  ? Insomnia 09/09/2019  ? Anxiety disorder 09/09/2019  ? Essential hypertension 06/03/2019  ? Hyperlipidemia 06/03/2019  ? History of cancer 04/29/2019  ? Vitamin B12 deficiency 04/29/2019  ? Vitamin D deficiency 04/29/2019  ? ? ?PCP: Tawnya Crook, MD ? ?REFERRING PROVIDER: Pete Pelt, PA-C ? ?REFERRING DIAG: M54.2 (ICD-10-CM) - Cervicalgia ? ?THERAPY DIAG:  ?Cervicalgia ? ?Muscle weakness (generalized) ? ?Difficulty in walking, not elsewhere classified ? ?Other low back pain ? ?ONSET DATE: 2 year onset of pain ? ?SUBJECTIVE:                                                                                                                                                                                                         ? ?SUBJECTIVE STATEMENT: ?neck pain for last 2 years limiting her ADL's. She also has debilitating back pain and was recommended to have MRI but she states insurance will not approve this until she tries PT. She has been using quad based cane for last 2 years to help walk due to pain. She gets  headaches every day she reports. She has not had any treatment for her neck/back pain. She does get N/T in her Rt leg ? ?PERTINENT HISTORY:  ?PMH:Ca,anx, insomnia,dep,chronic neck or back pain ? ?PAIN:  ?Are you having pain? Yes: NPRS scale: 8/10 ?Pain location: Rt side of neck and had headaches, also has chronic LBP  ?Pain description: throbbing ?Aggravating factors: any activity ?Relieving factors: meds ? ?PRECAUTIONS: None ? ?WEIGHT BEARING RESTRICTIONS No ? ?FALLS:  ?Has patient fallen in last 6 months? No ? ?LIVING ENVIRONMENT: ?Lives with: lives with their family and lives alone ?Lives in: House/apartment ?Stairs: 7 stairs to enter with one handrail on the right ?Has following equipment at home: Quad cane large base and Walker - 4 wheeled ? ?OCCUPATION: retired ? ?PLOF: Independent with basic ADLs ? ?PATIENT GOALS reduce pain, be able to clean her house with less pain ? ?OBJECTIVE:  ? ?DIAGNOSTIC FINDINGS:  ?Cervical spine films " Oblique view shows foraminal stenosis at C3-C4 C4-C5 and C5-C6.  C4-C5 and C5-C6 shows moderate to severe stenosis on the right.  No acute fractures." ?Has MRI ordered but not approved yet through insurance ? ?PATIENT SURVEYS:  ?FOTO 30% functional intake ? ? ?COGNITION: ?Overall cognitive status: Within functional limits for tasks assessed ? ? ?SENSATION: ?Hypersensitive  ? ?POSTURE:  ?Fwd head posture ? ?PALPATION: ?Hypersensitive and pain with light touch to neck   ? ?CERVICAL ROM:  ? ?Active ROM A/PROM (deg) ?10/19/2021  ?Flexion 20  ?Extension 25  ?Right lateral flexion 10  ?Left lateral flexion 10  ?Right rotation 20   ?Left rotation 20  ? (Blank rows = not tested) ? ?UE ROM: ? ?Active ROM Right ?10/19/2021 Left ?10/19/2021  ?Shoulder flexion 100 100  ?Shoulder extension    ?Shoulder abduction 100 100  ?Shoulder adduction    ?Shoulder extension    ?Shoulder internal rotation    ?Shoulder external rotation    ?Elbow flexion    ?Elbow extension    ?Wrist flexion    ?Wrist extension    ?Wrist ulnar deviation    ?Wrist radial deviation    ?Wrist pronation    ?Wrist supination    ? (Blank rows = not tested) ? ?UE MMT:  ?10/19/21: 3+MMT UE gross shoulder elbow, grip strength bilat ? ? (Blank rows = not tested) ? ?CERVICAL SPECIAL TESTS:  ?Spurling's test: Negative for radiculopathy but does have central pain with this ? ? ?FUNCTIONAL TESTS:  ?5 times sit to stand: only able to complete 4 reps due to pain, must use both arm rests 1:17 ? ? ?TODAY'S TREATMENT:  ?HEP creation and review ?TENS therapy IFC X10 min with heat to neck. (Also used heat to low back) ? ? ?PATIENT EDUCATION:  ?Education details: HEP,POC,TENS ?Person educated: Patient ?Education method: Explanation, Demonstration, Verbal cues, and Handouts ?Education comprehension: verbalized understanding, needs further instruction ? ? ?HOME EXERCISE PROGRAM: ?Access Code: [email or web address] ?URL: [email or web address] ?Date: 10/19/2021 ?Prepared by: Elsie Ra ? ?Exercises ?- Seated Assisted Cervical Rotation with Towel  - 2 x daily - 6 x weekly - 1 sets - 10 reps - 5 hold ?- Seated Passive Cervical Retraction  - 2 x daily - 6 x weekly - 1 sets - 10 reps ?- Seated Cervical Sidebending Stretch  - 2 x daily - 6 x weekly - 1 sets - 3 reps - 10 sec hold ?- Seated Isometric Cervical Sidebending  - 2 x daily - 6 x weekly - 1 sets - 10 reps - 5 hold ?-  Seated Isometric Cervical Flexion  - 2 x daily - 6 x weekly - 1 sets - 10 reps - 5 hold ?- Seated Isometric Cervical Extension  - 2 x daily - 6 x weekly - 1 sets - 10 reps - 5 hold ?- Seated Scapular Retraction  - 2 x daily - 6 x weekly  - 1 sets - 10 reps - 5 sec hold ? ?Patient Education ?- TENS Therapy ? ? ?ASSESSMENT: ? ?CLINICAL IMPRESSION: Patient presents with signs and symptoms consistent with chronic neck pain and back pain with mobility impairments and general weakness. She has very limited activity tolerance and is using large based quad cane for ambulation due to pain and poor balance. Patient will benefit from skilled PT to address below impairments and improve overall function. ? ?OBJECTIVE IMPAIRMENTS: decreased activity tolerance, difficulty walking, decreased balance, decreased endurance, decreased mobility, decreased ROM, decreased strength, impaired flexibility, impaired UE/LE use, postural dysfunction, and pain. ? ?ACTIVITY LIMITATIONS: bending, lifting, carry, locomotion, cleaning, community activity, driving ? ?PERSONAL FACTORS: Ca,anx, insomnia,dep, are also affecting patient's functional outcome. ? ? ?REHAB POTENTIAL: fair ? ?CLINICAL DECISION MAKING:  evolving/moderate ?EVALUATION COMPLEXITY: moderate ? ? ? ?GOALS: ?Short term PT Goals Target date: 11/16/2021 ?Pt will be I and compliant with HEP. ?Baseline:  ?Goal status: New ?Pt will decrease pain by 25% overall ?Baseline: ?Goal status: New ? ?Long term PT goals Target date: 12/14/2021 ?Pt will improve cervical ROM to [facility] to improve functional mobility ?Baseline: ?Goal status: New ?Pt will improve UE strength to grossly 4/5  MMT to improve functional strength ?Baseline: ?Goal status: New ?Pt will improve FOTO to at least 47% functional to show improved function ?Baseline: ?Goal status: New ?Pt will reduce pain by overall 50% overall with usual activity ?Baseline: ?Goal status: New ?Pt will improve 5 times sit to stand test to less than one minute using UE to push up from chair ?Baseline: ?Goal status: New ? ?PLAN: ?PT FREQUENCY: 1-2 times per week  ? ?PT DURATION: 6-8 weeks ? ?PLANNED INTERVENTIONS (unless contraindicated): aquatic PT, Canalith repositioning, cryotherapy,  Electrical stimulation, Iontophoresis with 4 mg/ml dexamethasome, Moist heat, traction, Ultrasound, gait training, Therapeutic exercise, balance training, neuromuscular re-education, patient/family educa

## 2021-10-21 ENCOUNTER — Telehealth: Payer: Self-pay

## 2021-10-21 DIAGNOSIS — M5416 Radiculopathy, lumbar region: Secondary | ICD-10-CM

## 2021-10-21 NOTE — Telephone Encounter (Signed)
Per Gabriel Cirri, MRI was denied due to lack of 6 weeks on conservative treatment. Order for PT was placed in pt's chart.  ? ?Lvm for pt to cb to discuss  ?

## 2021-10-27 ENCOUNTER — Telehealth: Payer: Self-pay | Admitting: Family Medicine

## 2021-10-27 NOTE — Telephone Encounter (Signed)
Left message to return my call.  

## 2021-10-27 NOTE — Telephone Encounter (Signed)
Wants to speak to nurse.  ? ?Needs a referral for painful back (spurs) and swollen foot, causing pain. ? ?Has an appointment on 04/21 at rehabilitation location. ? ?Please call asap ?

## 2021-10-27 NOTE — Telephone Encounter (Signed)
Patient stated that she need a referral to a new back doctor due to insurance. Patient stated that she is worried about the swelling and pain in her feet that won't go down. Patient stated that she cannot take more than two of the prescribed pain meds. Please advise.  ?

## 2021-10-28 NOTE — Telephone Encounter (Signed)
Patient called back and is aware that the ortho doctor is in the same office as the PT that she is seeing. Nothing further needed at this time.  ?

## 2021-10-29 ENCOUNTER — Ambulatory Visit (INDEPENDENT_AMBULATORY_CARE_PROVIDER_SITE_OTHER): Payer: Medicare HMO | Admitting: Physical Therapy

## 2021-10-29 ENCOUNTER — Encounter: Payer: Self-pay | Admitting: Physical Therapy

## 2021-10-29 DIAGNOSIS — M542 Cervicalgia: Secondary | ICD-10-CM | POA: Diagnosis not present

## 2021-10-29 DIAGNOSIS — R262 Difficulty in walking, not elsewhere classified: Secondary | ICD-10-CM | POA: Diagnosis not present

## 2021-10-29 DIAGNOSIS — M5459 Other low back pain: Secondary | ICD-10-CM | POA: Diagnosis not present

## 2021-10-29 DIAGNOSIS — M6281 Muscle weakness (generalized): Secondary | ICD-10-CM

## 2021-10-29 NOTE — Therapy (Signed)
?OUTPATIENT PHYSICAL THERAPY TREATMENT NOTE ? ? ?Patient Name: Tracey Saunders ?MRN: [id number] ?DOB:[birth year], 66 y.o., female ?Today's Date: 10/29/2021 ? ?PCP: Tawnya Crook, MD ?REFERRING PROVIDER: Pete Pelt, PA-C ? ?END OF SESSION:  ? PT End of Session - 10/29/21 0854   ? ? Visit Number 2   ? Number of Visits 8   ? Date for PT Re-Evaluation 12/14/21   ? Authorization Type Aetna MCR   ? PT Start Time [phone]   ? PT Stop Time 2683   ? PT Time Calculation (min) 38 min   ? Activity Tolerance Patient limited by pain   ? Behavior During Therapy [facility] for tasks assessed/performed   ? ?  ?  ? ?  ? ? ?Past Medical History:  ?Diagnosis Date  ? Anxiety disorder 09/09/2019  ? Cancer [facility])   ? cervical cancer  ? Depression   ? Diverticulitis   ? Fatty liver   ? GERD (gastroesophageal reflux disease)   ? Hyperlipidemia   ? Hypertension   ? Insomnia 09/09/2019  ? Liver cyst   ? Vitamin D deficiency   ? ?Past Surgical History:  ?Procedure Laterality Date  ? ABDOMINAL HYSTERECTOMY    ? COLON RESECTION    ? due to diverticulitis  ? ?Patient Active Problem List  ? Diagnosis Date Noted  ? Chronic right-sided low back pain with sciatica 06/22/2020  ? 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 Headache, drug induced 03/12/2020  ? Hair thinning 11/12/2019  ? Recurrent major depressive disorder, in full remission ([place]) 11/04/2019  ? Gastroesophageal reflux disease 09/09/2019  ? Insomnia 09/09/2019  ? Anxiety disorder 09/09/2019  ? Essential hypertension 06/03/2019  ? Hyperlipidemia 06/03/2019  ? History of cancer 04/29/2019  ? Vitamin B12 deficiency 04/29/2019  ? Vitamin D deficiency 04/29/2019  ? ? ? ?THERAPY DIAG:  ?Cervicalgia ? ?Muscle weakness (generalized) ? ?Difficulty in walking, not elsewhere classified ? ?Other low back pain ? ?PCP: Tawnya Crook, MD ?  ?REFERRING PROVIDER: Pete Pelt, PA-C ?  ?REFERRING DIAG: M54.2 (ICD-10-CM) - Cervicalgia ?  ?ONSET DATE: 2 year onset of pain ?  ?SUBJECTIVE:                                                                                                                                                                                                         ?  ?SUBJECTIVE STATEMENT: ?She states overall severe pain, she is worse, she has swelling in her feet and legs, she did not sleep well last night. Feels like she has a knot on  the right side of her neck ?  ?PERTINENT HISTORY:  ?PMH:Ca,anx, insomnia,dep,chronic neck or back pain ?  ?PAIN:  ?Are you having pain? Yes: NPRS scale: 9-10/10 ?Pain location: Rt side of neck and had headaches, also has chronic LBP  ?Pain description: throbbing ?Aggravating factors: any activity ?Relieving factors: meds ?  ?PRECAUTIONS: None ?  ?WEIGHT BEARING RESTRICTIONS No ?  ?FALLS:  ?Has patient fallen in last 6 months? No ?  ?LIVING ENVIRONMENT: ?Lives with: lives with their family and lives alone ?Lives in: House/apartment ?Stairs: 7 stairs to enter with one handrail on the right ?Has following equipment at home: Quad cane large base and Walker - 4 wheeled ?  ?OCCUPATION: retired ?  ?PLOF: Independent with basic ADLs ?  ?PATIENT GOALS reduce pain, be able to clean her house with less pain ?  ?OBJECTIVE:  ?  ?DIAGNOSTIC FINDINGS:  ?Cervical spine films " Oblique view shows foraminal stenosis at C3-C4 C4-C5 and C5-C6.  C4-C5 and C5-C6 shows moderate to severe stenosis on the right.  No acute fractures." ?Has MRI ordered but not approved yet through insurance ?  ?PATIENT SURVEYS:  ?FOTO 30% functional intake ?  ?  ?COGNITION: ?Overall cognitive status: Within functional limits for tasks assessed ?  ?  ?SENSATION: ?Hypersensitive  ?  ?POSTURE:  ?Fwd head posture ?  ?PALPATION: ?Hypersensitive and pain with light touch to neck            ?  ?CERVICAL ROM:  ?  ?Active ROM A/PROM (deg) ?10/19/2021  ?Flexion 20  ?Extension 25  ?Right lateral flexion 10  ?Left lateral flexion 10  ?Right rotation 20  ?Left rotation 20  ? (Blank rows = not tested) ?  ?UE ROM: ?  ?Active ROM  Right ?10/19/2021 Left ?10/19/2021  ?Shoulder flexion 100 100  ?Shoulder extension      ?Shoulder abduction 100 100  ?Shoulder adduction      ?Shoulder extension      ?Shoulder internal rotation      ?Shoulder external rotation      ?Elbow flexion      ?Elbow extension      ?Wrist flexion      ?Wrist extension      ?Wrist ulnar deviation      ?Wrist radial deviation      ?Wrist pronation      ?Wrist supination      ? (Blank rows = not tested) ?  ?UE MMT:  ?10/19/21: 3+MMT UE gross shoulder elbow, grip strength bilat ?  ? (Blank rows = not tested) ?  ?CERVICAL SPECIAL TESTS:  ?Spurling's test: Negative for radiculopathy but does have central pain with this ?  ?  ?FUNCTIONAL TESTS:  ?5 times sit to stand: only able to complete 4 reps due to pain, must use both arm rests 1:17 ?  ?  ?TODAY'S TREATMENT:  ?10/29/21 ?Nu step L4 X 4 min (had so stop due to pain) ?Seated neck retractions X10 ?Seated neck lateral flexion 5 sec hold X10 bilat ?Seated neck rotation 5 sec hold X10 bilat ?Scapular retractions X10 ?Neck isometrics lateral flexion bilat, flexion, and extension 5 sec X10 ?Seated lumbar flexion ball roll out 5 sec X10 ?Standing lumbar extension X10 ? ? ?10/19/21 ?HEP creation and review ?TENS therapy IFC X10 min with heat to neck. (Also used heat to low back) ?  ?  ?PATIENT EDUCATION:  ?Education details: HEP,POC,TENS ?Person educated: Patient ?Education method: Explanation, Demonstration, Verbal cues, and Handouts ?Education comprehension: verbalized understanding, needs  further instruction ?  ?  ?HOME EXERCISE PROGRAM: ?Access Code: [email or web address] ?URL: [email or web address] ?Date: 10/19/2021 ?Prepared by: Elsie Ra ?  ?Exercises ?- Seated Assisted Cervical Rotation with Towel  - 2 x daily - 6 x weekly - 1 sets - 10 reps - 5 hold ?- Seated Passive Cervical Retraction  - 2 x daily - 6 x weekly - 1 sets - 10 reps ?- Seated Cervical Sidebending Stretch  - 2 x daily - 6 x weekly - 1 sets - 3 reps - 10 sec hold ?-  Seated Isometric Cervical Sidebending  - 2 x daily - 6 x weekly - 1 sets - 10 reps - 5 hold ?- Seated Isometric Cervical Flexion  - 2 x daily - 6 x weekly - 1 sets - 10 reps - 5 hold ?- Seated Isometric Cervical Extension  - 2 x daily - 6 x weekly - 1 sets - 10 reps - 5 hold ?- Seated Scapular Retraction  - 2 x daily - 6 x weekly - 1 sets - 10 reps - 5 sec hold ?  ?Patient Education ?- TENS Therapy ?  ?  ?ASSESSMENT: ?  ?CLINICAL IMPRESSION:  ?10/29/21:Limited by pain which makes exercise program difficult to progress. She complains of bilateral leg swelling and insists she needs to see a back doctor. I told her bilateral leg swelling is usually not a sign of a back issue that she may need to be checked out by PCP or hear doctor. She instead wants to see a back doctor so I recommended she reach out to PCP or Dr. Ninfa Linden who can refer her to someone if needed but it seems like they want her to try PT first. PT is difficult at this time as she can only tolerate very minimal activity. She did not get any relief from TENS therapy so this was discontinued. ? ?10/19/21: Patient presents with signs and symptoms consistent with chronic neck pain and back pain with mobility impairments and general weakness. She has very limited activity tolerance and is using large based quad cane for ambulation due to pain and poor balance. Patient will benefit from skilled PT to address below impairments and improve overall function. ?  ?OBJECTIVE IMPAIRMENTS: decreased activity tolerance, difficulty walking, decreased balance, decreased endurance, decreased mobility, decreased ROM, decreased strength, impaired flexibility, impaired UE/LE use, postural dysfunction, and pain. ?  ?ACTIVITY LIMITATIONS: bending, lifting, carry, locomotion, cleaning, community activity, driving ?  ?PERSONAL FACTORS: Ca,anx, insomnia,dep, are also affecting patient's functional outcome. ?  ?  ?REHAB POTENTIAL: fair ?  ?CLINICAL DECISION MAKING:   evolving/moderate ?EVALUATION COMPLEXITY: moderate ?  ?  ?  ?GOALS: ?Short term PT Goals Target date: 11/16/2021 ?Pt will be I and compliant with HEP. ?Baseline:  ?Goal status: New ?Pt will decrease pain by 25% overall ?[place]

## 2021-11-01 ENCOUNTER — Telehealth: Payer: Self-pay | Admitting: Family Medicine

## 2021-11-01 NOTE — Telephone Encounter (Signed)
?  Patient ?Name: ?Tracey Saunders ?MAS ?Gender: Female ?DOB: 1955-08-14 ?Age: 66 Y 5 M 8 D ?Return ?Phone ?Number: ?[id number] ?(Primary) ?Address: ?City/ ?State/ ?Zip: ?[place] ? [address] ?Client [place] [facility] [place] [facility] ?[place] ?Presenter, broadcasting at [place] [employer] ?Contact Type Call ?Who Is Calling Patient / Member / Family / Caregiver ?Call Type Triage / Clinical ?Relationship To Patient Self ?Return Phone Number [phone] (Primary) ?Chief Complaint Leg Swelling And Edema ?Reason for Call Symptomatic / Request for Health Information ?Initial Comment Caller states that she is holding fluid in her feet, ?ankles and legs. She can barely walk and it is ?painful. ?Translation No ?Nurse Assessment ?Nurse: Lissa Merlin, RN, [place] Date/Time Eilene Ghazi Time): 10/31/2021 5:40:45 PM ?Confirm and document reason for call. If ?symptomatic, describe symptoms. ?---Caller states that she has massive swelling in her ?ankles and legs. Can't wear shoes and can barely get ?her socks on. Pt was told to get a diuretic and has been ?taking 8 pills like she has been told to do. It is caffeine ?anhydrous ?Does the patient have any new or worsening ?symptoms? ---Yes ?Will a triage be completed? ---Yes ?Related visit to physician within the last 2 weeks? ---Yes ?Does the PT have any chronic conditions? (i.e. ?diabetes, asthma, this includes High risk factors for ?pregnancy, etc.) ?---Yes ?List chronic conditions. ---HTN Sleep problems Depression High cholesterol ?Is this a behavioral health or substance abuse call? ---No ?Guidelines ?Guideline Title Affirmed Question Affirmed Notes Nurse Date/Time (Eastern ?Time) ?Leg Swelling and ?Edema ?[1] Can't walk or can ?barely walk AND [2] ?new-onset ?Lissa Merlin, RN, [place] 10/31/2021 5:44:38 ?PM ?Disp. Time (Eastern ?Time) Disposition Final User ?10/31/2021 5:46:56 PM Go to ED Now Yes Lissa Merlin, RN, Abigail ?Caller Disagree/Comply Comply ?Caller Understands  Yes ?PreDisposition InappropriateToAsk ?Care Advice Given Per Guideline ?GO TO ED NOW: * You need to be seen in the Emergency Department. * Go to the ED at ___________ [place] now. ?Drive carefully. * Bring a list of your current medicines when you go to the Emergency Department (ER). BRING MEDICINES: ?CARE ADVICE given per Leg Swelling and Edema (Adult) guideline. ?Comments ?User: Tildon Husky, RN Date/Time Eilene Ghazi Time): 10/31/2021 5:50:07 PM ?Advised that message will be sent to the office and that they should also follow up with the office when they open ?back up. ?Referrals ?[facility] - ED ?

## 2021-11-01 NOTE — Telephone Encounter (Signed)
Patient has called in stating she has severely swollen legs.  That are painful.   States she did not go to ED due to lack of transportation.  Patient wanted appointment for Tuesday 4/25.  I have suggest to patient to head to ED now and that she could call EMS for transport.  Patient states she will do this.   Please follow up in regard.

## 2021-11-01 NOTE — Telephone Encounter (Signed)
Spoke to pt  asked her if she went to ED? Pt said no, she did not want to go, because her swelling has come down when she woke up this morning and she is taking her water pill and thinks it finally kicked in. Asked her how the pain in her legs is? Pt said pain is better too, rates it 5/10, she is elevating her legs. Asked pt if any SOB? Pt said no. Told her okay if any increase in swelling, pain or SOB need to go to the ED. Pt verbalized understanding. ?

## 2021-11-01 NOTE — Telephone Encounter (Signed)
Aldona Bar made aware of pt's condition. ?

## 2021-11-01 NOTE — Telephone Encounter (Signed)
Noted -- chart reviewed. ? ?Agreed with recommendation for patient to go to ER. ? ?Inda Coke PA-C ?

## 2021-11-06 ENCOUNTER — Other Ambulatory Visit: Payer: Self-pay | Admitting: Internal Medicine

## 2021-11-08 NOTE — Progress Notes (Signed)
This encounter was created in error - please disregard.

## 2021-11-09 ENCOUNTER — Ambulatory Visit: Payer: Medicare HMO | Admitting: Family Medicine

## 2021-11-11 ENCOUNTER — Ambulatory Visit: Payer: Medicare HMO | Admitting: Family Medicine

## 2021-11-12 ENCOUNTER — Ambulatory Visit: Payer: Medicare HMO | Admitting: Physical Therapy

## 2021-11-12 ENCOUNTER — Encounter: Payer: Self-pay | Admitting: Physical Therapy

## 2021-11-12 DIAGNOSIS — R262 Difficulty in walking, not elsewhere classified: Secondary | ICD-10-CM

## 2021-11-12 DIAGNOSIS — M5459 Other low back pain: Secondary | ICD-10-CM

## 2021-11-12 DIAGNOSIS — M6281 Muscle weakness (generalized): Secondary | ICD-10-CM

## 2021-11-12 DIAGNOSIS — M542 Cervicalgia: Secondary | ICD-10-CM | POA: Diagnosis not present

## 2021-11-12 NOTE — Therapy (Addendum)
OUTPATIENT PHYSICAL THERAPY TREATMENT NOTE Discharge    Patient Name: Tracey Saunders MRN: [id number] DOB:02/23/1956, 65 y.o., female Today's Date: 11/12/2021  PCP: Kulik, Ann Marie, MD REFERRING PROVIDER: Clark, Gilbert W, PA-C  END OF SESSION:   PT End of Session - 11/12/21 1118     Visit Number 3    Number of Visits 8    Date for PT Re-Evaluation 12/14/21    Authorization Type [employer] MCR    PT Start Time 1105    PT Stop Time 1145    PT Time Calculation (min) 40 min    Activity Tolerance Patient limited by pain    Behavior During Therapy WFL for tasks assessed/performed              Past Medical History:  Diagnosis Date   Anxiety disorder 09/09/2019   Cancer (HCC)    cervical cancer   Depression    Diverticulitis    Fatty liver    GERD (gastroesophageal reflux disease)    Hyperlipidemia    Hypertension    Insomnia 09/09/2019   Liver cyst    Vitamin D deficiency    Past Surgical History:  Procedure Laterality Date   ABDOMINAL HYSTERECTOMY     COLON RESECTION     due to diverticulitis   Patient Active Problem List   Diagnosis Date Noted   Chronic right-sided low back pain with sciatica 06/22/2020   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   Headache, drug induced 03/12/2020   Hair thinning 11/12/2019   Recurrent major depressive disorder, in full remission (HCC) 11/04/2019   Gastroesophageal reflux disease 09/09/2019   Insomnia 09/09/2019   Anxiety disorder 09/09/2019   Essential hypertension 06/03/2019   Hyperlipidemia 06/03/2019   History of cancer 04/29/2019   Vitamin B12 deficiency 04/29/2019   Vitamin D deficiency 04/29/2019     THERAPY DIAG:  Cervicalgia  Muscle weakness (generalized)  Difficulty in walking, not elsewhere classified  Other low back pain  PCP: Kulik, Ann Marie, MD   REFERRING PROVIDER: Clark, Gilbert W, PA-C   REFERRING DIAG: M54.2 (ICD-10-CM) - Cervicalgia   ONSET DATE: 2 year onset of pain   SUBJECTIVE:                                                                                                                                                                                                           SUBJECTIVE STATEMENT: Feels her legs are a little better, but still having headaches.  "The more I do the worse it gets." Report she has recent family history of   blood disorder and mother had leukemia as well as needing stenting in LEs (she's unsure of specifics) Wants to wait on additional PT appointments until she sees MD.   PERTINENT HISTORY:  PMH:Ca,anx, insomnia,dep,chronic neck or back pain   PAIN:  Are you having pain? Yes: NPRS scale: 8/10 Pain location: Rt side of neck and had headaches, also has chronic LBP  Pain description: throbbing Aggravating factors: any activity Relieving factors: meds   PRECAUTIONS: None   WEIGHT BEARING RESTRICTIONS No   FALLS:  Has patient fallen in last 6 months? 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   OCCUPATION: retired   PLOF: Independent with basic ADLs   PATIENT GOALS reduce pain, be able to clean her house with less pain   OBJECTIVE:    PATIENT SURVEYS:  Eval: FOTO 30% functional intake 11/12/21: FOTO 26%     SENSATION: Hypersensitive    POSTURE:  Fwd head posture   PALPATION: Hypersensitive and pain with light touch to neck              CERVICAL ROM:    Active ROM A/PROM (deg) 10/19/2021 AROM  11/12/21  Flexion 20 28  Extension 25 20  Right lateral flexion 10 22  Left lateral flexion 10 20  Right rotation 20 37  Left rotation 20 15   (Blank rows = not tested)   UE ROM:   Active ROM Right 10/19/2021 Left 10/19/2021  Shoulder flexion 100 100  Shoulder extension      Shoulder abduction 100 100   (Blank rows = not tested)   UE MMT:   10/19/21: 3+MMT UE gross shoulder elbow, grip strength bilat 11/12/21: 3/5 bil shoulder MMT with give way weakness noted    (Blank rows = not tested)   CERVICAL SPECIAL TESTS:  Spurling's test: Negative for radiculopathy but does have central pain with this     FUNCTIONAL TESTS:  Eval: 5 times sit to stand: only able to complete 4 reps due to pain, must use both arm rests 1:17  11/12/21: 5x STS: only able to complete 4 reps due to pain with arm rests: 55 sec     TODAY'S TREATMENT:  11/12/21 Therex:      Aerobic: NuStep L3 x 8 min     Sitting: Scapular retraction x10 reps Cervical retraction x 10 reps See objective measures as well Increased time needed between activities and with amb within clinc due to pain.  SBA needed with amb due to occasional step backs due to pain.    10/29/21 Nu step L4 X 4 min (had so stop due to pain) Seated neck retractions X10 Seated neck lateral flexion 5 sec hold X10 bilat Seated neck rotation 5 sec hold X10 bilat Scapular retractions X10 Neck isometrics lateral flexion bilat, flexion, and extension 5 sec X10 Seated lumbar flexion ball roll out 5 sec X10 Standing lumbar extension X10   10/19/21 HEP creation and review TENS therapy IFC X10 min with heat to neck. (Also used heat to low back)     PATIENT EDUCATION:  Education details: HEP,POC,TENS Person educated: Patient Education method: Consulting civil engineer, Media planner, Verbal cues, and Handouts Education comprehension: verbalized understanding, needs further instruction     HOME EXERCISE PROGRAM: Access Code: [email or web address] URL: [email or web address] Date: 10/19/2021 Prepared by: Elsie Ra  Exercises - Seated Assisted Cervical Rotation with Towel  - 2 x daily - 6 x weekly - 1 sets - 10 reps - 5 hold - Seated Passive Cervical Retraction  - 2 x daily - 6 x weekly - 1 sets - 10 reps - Seated Cervical Sidebending Stretch  - 2 x daily - 6 x weekly - 1 sets - 3 reps - 10 sec hold -  Seated Isometric Cervical Sidebending  - 2 x daily - 6 x weekly - 1 sets - 10 reps - 5 hold - Seated Isometric Cervical Flexion  - 2 x daily - 6 x weekly - 1 sets - 10 reps - 5 hold - Seated Isometric Cervical Extension  - 2 x daily - 6 x weekly - 1 sets - 10 reps - 5 hold - Seated Scapular Retraction  - 2 x daily - 6 x weekly - 1 sets - 10 reps - 5 sec hold   Patient Education - TENS Therapy     ASSESSMENT:   CLINICAL IMPRESSION:  Pt with limited change in pain and function at this time.  She would like to follow up with her PCP to discuss her concerns and hold on PT at this time.  Agree with pt's request due to limited progress at this time.  Will hold PT.     10/29/21:Limited by pain which makes exercise program difficult to progress. She complains of bilateral leg swelling and insists she needs to see a back doctor. I told her bilateral leg swelling is usually not a sign of a back issue that she may need to be checked out by PCP or hear doctor. She instead wants to see a back doctor so I recommended she reach out to PCP or Dr. Blackman who can refer her to someone if needed but it seems like they want her to try PT first. PT is difficult at this time as she can only tolerate very minimal activity. She did not get any relief from TENS therapy so this was discontinued.  10/19/21: Patient presents with signs and symptoms consistent with chronic neck pain and back pain with mobility impairments and general weakness. She has very limited activity tolerance and is using large based quad cane for ambulation due to pain and poor balance. Patient will benefit from skilled PT to address below impairments and improve overall function.   OBJECTIVE IMPAIRMENTS: decreased activity tolerance, difficulty walking, decreased balance, decreased endurance, decreased mobility, decreased ROM, decreased strength, impaired flexibility, impaired UE/LE use, postural dysfunction, and pain.   ACTIVITY LIMITATIONS:  bending, lifting, carry, locomotion, cleaning, community activity, driving   PERSONAL FACTORS: Ca,anx, insomnia,dep, are also affecting patient's functional outcome.     REHAB POTENTIAL: fair   CLINICAL DECISION MAKING:  evolving/moderate EVALUATION COMPLEXITY: moderate       GOALS: Short term PT Goals Target date: 11/16/2021 Pt will be I and compliant with HEP. Baseline:  Goal status: MET 11/12/21 Pt will decrease pain by 25% overall Baseline: Goal status: NOT MET 11/12/21   Long term PT goals Target date: 12/14/2021 Pt will improve cervical ROM to WFL to improve functional mobility Baseline: Goal status: Partially Met 11/12/21 Pt will improve UE strength to grossly 4/5  MMT to improve functional strength Baseline: Goal status: NOT MET 11/12/21 Pt will improve FOTO to at least 47% functional to show improved function Baseline: Goal status: NOT MET 11/12/21 Pt will reduce pain by overall 50% overall with usual activity Baseline: Goal status: NOT MET 11/12/21   Pt will improve 5 times sit to stand test to less than one minute using UE to push up from chair Baseline: Goal status: NOT MET 11/12/21   PLAN: PT FREQUENCY: 1-2 times per week    PT DURATION: 6-8 weeks   PLANNED INTERVENTIONS (unless contraindicated): aquatic PT, Canalith repositioning, cryotherapy, Electrical stimulation, Iontophoresis with 4 mg/ml dexamethasome, Moist heat, traction, Ultrasound, gait training, Therapeutic exercise, balance training, neuromuscular re-education, patient/family education, prosthetic training, manual techniques, passive ROM, dry needling, taping, vasopnuematic device, vestibular, spinal manipulations, joint manipulations   PLAN FOR NEXT SESSION: hold PT until after MD appt    Faustino Congress, PT,DPT 11/12/2021, 11:47 AM   PHYSICAL THERAPY DISCHARGE SUMMARY  Visits from Start of Care: 3  Current functional level related to goals / functional outcomes: See above   Remaining deficits: See  above   Education / Equipment: HEP   Patient agrees to discharge. Patient goals were not met. Patient is being discharged due to a change in medical status.

## 2021-11-22 ENCOUNTER — Ambulatory Visit (INDEPENDENT_AMBULATORY_CARE_PROVIDER_SITE_OTHER): Payer: Medicare HMO | Admitting: Family Medicine

## 2021-11-22 ENCOUNTER — Encounter: Payer: Self-pay | Admitting: Family Medicine

## 2021-11-22 VITALS — BP 130/84 | HR 86 | Temp 98.1°F | Ht 61.0 in | Wt 148.4 lb

## 2021-11-22 DIAGNOSIS — R600 Localized edema: Secondary | ICD-10-CM

## 2021-11-22 DIAGNOSIS — I10 Essential (primary) hypertension: Secondary | ICD-10-CM | POA: Diagnosis not present

## 2021-11-22 DIAGNOSIS — M5441 Lumbago with sciatica, right side: Secondary | ICD-10-CM | POA: Diagnosis not present

## 2021-11-22 DIAGNOSIS — E538 Deficiency of other specified B group vitamins: Secondary | ICD-10-CM

## 2021-11-22 DIAGNOSIS — G8929 Other chronic pain: Secondary | ICD-10-CM | POA: Diagnosis not present

## 2021-11-22 LAB — CBC WITH DIFFERENTIAL/PLATELET
Basophils Absolute: 0.1 10*3/uL (ref 0.0–0.1)
Basophils Relative: 0.8 % (ref 0.0–3.0)
Eosinophils Absolute: 0.1 10*3/uL (ref 0.0–0.7)
Eosinophils Relative: 0.6 % (ref 0.0–5.0)
HCT: 38.3 % (ref 36.0–46.0)
Hemoglobin: 12.8 g/dL (ref 12.0–15.0)
Lymphocytes Relative: 15.1 % (ref 12.0–46.0)
Lymphs Abs: 1.8 10*3/uL (ref 0.7–4.0)
MCHC: 33.5 g/dL (ref 30.0–36.0)
MCV: 93.5 fl (ref 78.0–100.0)
Monocytes Absolute: 0.7 10*3/uL (ref 0.1–1.0)
Monocytes Relative: 5.9 % (ref 3.0–12.0)
Neutro Abs: 9.4 10*3/uL — ABNORMAL HIGH (ref 1.4–7.7)
Neutrophils Relative %: 77.6 % — ABNORMAL HIGH (ref 43.0–77.0)
Platelets: 296 10*3/uL (ref 150.0–400.0)
RBC: 4.1 Mil/uL (ref 3.87–5.11)
RDW: 14 % (ref 11.5–15.5)
WBC: 12.1 10*3/uL — ABNORMAL HIGH (ref 4.0–10.5)

## 2021-11-22 LAB — VITAMIN B12: Vitamin B-12: >1504 pg/mL — ABNORMAL HIGH (ref 211–911)

## 2021-11-22 MED ORDER — POTASSIUM CHLORIDE CRYS ER 20 MEQ PO TBCR: 20.0000 meq | EXTENDED_RELEASE_TABLET | Freq: Every day | ORAL | 1 refills | Status: DC

## 2021-11-22 MED ORDER — GABAPENTIN 300 MG PO CAPS: 600.0000 mg | ORAL_CAPSULE | Freq: Three times a day (TID) | ORAL | 1 refills | Status: DC

## 2021-11-22 MED ORDER — CYANOCOBALAMIN 1000 MCG/ML IJ SOLN
1000.0000 ug | Freq: Once | INTRAMUSCULAR | Status: AC
  Administered 2021-11-22: 1000 ug via INTRAMUSCULAR

## 2021-11-22 MED ORDER — FUROSEMIDE 20 MG PO TABS: 20.0000 mg | ORAL_TABLET | Freq: Every day | ORAL | 3 refills | Status: DC

## 2021-11-22 NOTE — Progress Notes (Signed)
? ?Subjective:  ? ? ? Patient ID: Tracey Saunders, female    DOB: [birth year], 66 y.o.   MRN: [id number] ? ?Chief Complaint  ?Patient presents with  ? Discuss Leukemia  ?  Mother passed away from Leukemia, discuss possibly having some form of leukemia  ? Foot Swelling  ?  Bilateral foot pain and swelling, constant  ? ? ?HPI ? Concerned w/leukemia-mom had issues w/legs as well. Dec 2 yrs ago.  Mom had stent in legs and told "blood disorder"  myelodysplasia ?Feet-swollen all the time and painful.  More edema from legs and "knots".  And will get numb ?HTN-off losartan d/t angioedema.   ?Chronic pain in legs-taking gabapentin 600tid.  ? ?Health Maintenance Due  ?Topic Date Due  ? Hepatitis C Screening  Never done  ? Zoster Vaccines- Shingrix (1 of 2) Never done  ? MAMMOGRAM  Never done  ? DEXA SCAN  Never done  ? ? ?Past Medical History:  ?Diagnosis Date  ? Anxiety disorder 09/09/2019  ? Cancer [facility])   ? cervical cancer  ? Depression   ? Diverticulitis   ? Fatty liver   ? GERD (gastroesophageal reflux disease)   ? Hyperlipidemia   ? Hypertension   ? Insomnia 09/09/2019  ? Liver cyst   ? Vitamin D deficiency   ? ? ?Past Surgical History:  ?Procedure Laterality Date  ? ABDOMINAL HYSTERECTOMY    ? COLON RESECTION    ? due to diverticulitis  ? ? ?Outpatient Medications Prior to Visit  ?Medication Sig Dispense Refill  ? albuterol (VENTOLIN HFA) 108 (90 Base) MCG/ACT inhaler Inhale 2 puffs into the lungs every 6 (six) hours as needed for wheezing or shortness of breath. 1 each 2  ? atorvastatin (LIPITOR) 20 MG tablet Take 1 tablet (20 mg total) by mouth daily. 90 tablet 1  ? fluticasone (FLONASE) 50 MCG/ACT nasal spray Place 2 sprays into both nostrils daily. 16 g 1  ? hydrochlorothiazide (HYDRODIURIL) 12.5 MG tablet Take 1 tablet (12.5 mg total) by mouth daily. 90 tablet 0  ? loratadine (CLARITIN) 10 MG tablet Take 1 tablet (10 mg total) by mouth daily. 90 tablet 1  ? montelukast (SINGULAIR) 10 MG tablet TAKE 1 TABLET BY MOUTH  EVERYDAY AT BEDTIME 90 tablet 3  ? sertraline (ZOLOFT) 50 MG tablet TAKE 1 TABLET AT BEDTIME 90 tablet 0  ? tizanidine (ZANAFLEX) 2 MG capsule Take 1 capsule (2 mg total) by mouth 3 (three) times daily. 30 capsule 0  ? traZODone (DESYREL) 50 MG tablet Take 2 tablets (100 mg total) by mouth at bedtime as needed for sleep. 180 tablet 1  ? triamcinolone ointment (KENALOG) 0.1 % Apply 1 application topically 2 (two) times daily. 30 g 0  ? amLODipine (NORVASC) 10 MG tablet Take 1 tablet (10 mg total) by mouth daily. 90 tablet 1  ? gabapentin (NEURONTIN) 300 MG capsule Take 1 capsule (300 mg total) by mouth 3 (three) times daily. 270 capsule 1  ? losartan (COZAAR) 25 MG tablet Take 1 tablet (25 mg total) by mouth daily. 30 tablet 1  ? dicyclomine (BENTYL) 10 MG capsule Take 1 capsule (10 mg total) by mouth 4 (four) times daily -  before meals and at bedtime. 120 capsule 0  ? pantoprazole (PROTONIX) 20 MG tablet Take 1 tablet (20 mg total) by mouth daily. 90 tablet 1  ? ?No facility-administered medications prior to visit.  ? ? ?Allergies  ?Allergen Reactions  ? Aleve-D Sinus & Cold [Pseudoephedrine-Naproxen Na  Er]   ? Losartan Swelling  ? ?ROS neg/noncontributory except as noted HPI/below ? ? ?   ?Objective:  ?  ? ?BP 130/84   Pulse 86   Temp 98.1 ?F (36.7 ?C) (Temporal)   Ht '5\' 1"'$  (1.549 m)   Wt 148 lb 6 oz (67.3 kg)   SpO2 99%   BMI 28.04 kg/m?  ?Wt Readings from Last 3 Encounters:  ?11/22/21 148 lb 6 oz (67.3 kg)  ?10/12/21 141 lb 6 oz (64.1 kg)  ?09/07/21 141 lb 6 oz (64.1 kg)  ? ? ?Physical Exam  ? ?Gen: WDWN NAD wf ?HEENT: NCAT, conjunctiva not injected, sclera nonicteric ?NECK:  supple, no thyromegaly, no nodes, no carotid bruits ?CARDIAC: RRR, S1S2+, no murmur. DP 2+B ?LUNGS: CTAB. No wheezes ?ABDOMEN:  BS+, soft, NTND, No HSM, no masses ?EXT:  2+ edema ble 1/3 up ?MSK: cane.  ?NEURO: A&O x3.  CN II-XII intact.  ?PSYCH: normal mood. Good eye contact ? ?   ?Assessment & Plan:  ? ?Problem List Items Addressed  This Visit   ? ?  ? Cardiovascular and Mediastinum  ? Essential hypertension - Primary  ? Relevant Medications  ? furosemide (LASIX) 20 MG tablet  ?  ? Nervous and Auditory  ? Chronic right-sided low back pain with sciatica  ? Relevant Medications  ? gabapentin (NEURONTIN) 300 MG capsule  ?  ? Other  ? Vitamin B12 deficiency  ? Relevant Orders  ? CBC with Differential/Platelet  ? Vitamin B12  ? ?Other Visit Diagnoses   ? ? Localized edema      ? ?  ? HTN-chronic.  Controlled but suspect edema from amlodipine.  Will stop amlodipine and hctz.  Do lasix '20mg'$  daily and K 20MEQ daily.  Monitor bp and edema.  Call w/progress ?Edema-suspect from amlodipine.  Very uncomfortable.  Stop amlodipine and hctz-start lasix  and K.  Monitor.   ?B12 def-getting injection about monthly.  Taking liquid otc.  Not consistent on monthly injections(xportation issues).  Check B12/cbc.  Injection done today.  F/u 1 mo ?Chronic pain-partly compounded by edema.  Pt has inc gabapentin to 600tid-will adjust refills.  Continue.  Hopefully, will get some relief w/less edema ? ?Meds ordered this encounter  ?Medications  ? cyanocobalamin ((VITAMIN B-12)) injection 1,000 mcg  ? gabapentin (NEURONTIN) 300 MG capsule  ?  Sig: Take 2 capsules (600 mg total) by mouth 3 (three) times daily.  ?  Dispense:  270 capsule  ?  Refill:  1  ? furosemide (LASIX) 20 MG tablet  ?  Sig: Take 1 tablet (20 mg total) by mouth daily.  ?  Dispense:  30 tablet  ?  Refill:  3  ? potassium chloride SA (KLOR-CON M) 20 MEQ tablet  ?  Sig: Take 1 tablet (20 mEq total) by mouth daily.  ?  Dispense:  30 tablet  ?  Refill:  1  ? ? ?Wellington Hampshire, MD ? ?

## 2021-11-22 NOTE — Patient Instructions (Signed)
Stop amlodipine ?Stop HCTZ(hydrochlorothiazide) ? ?Start furosemide and potassium.  Let me know.  Monitor bp's.   ?

## 2021-11-23 ENCOUNTER — Other Ambulatory Visit: Payer: Self-pay | Admitting: Physician Assistant

## 2021-11-25 ENCOUNTER — Telehealth: Payer: Self-pay

## 2021-11-25 NOTE — Telephone Encounter (Signed)
Please see note below and advise  

## 2021-11-25 NOTE — Telephone Encounter (Signed)
Patient notified and verbalized understanding. Patient has not see ortho yet. Patient stated that blood pressure has when down some. Will continue to monitor and let us know.

## 2021-11-25 NOTE — Telephone Encounter (Signed)
Patient states BP is 157/81 right now since she was taken off her bp meds.  States she has a bad headache.  States Dr. Cherlynn Kaiser wanted her to call in to get adjustments.  Please follow up with patient in regard.   States her feet and ankle are getting better but she is still having swelling in the top of her feet, ankles and in toes. States she is having a lot of pain in feet.

## 2021-12-08 ENCOUNTER — Other Ambulatory Visit: Payer: Self-pay | Admitting: *Deleted

## 2021-12-08 MED ORDER — FUROSEMIDE 20 MG PO TABS: 20.0000 mg | ORAL_TABLET | Freq: Every day | ORAL | 3 refills | Status: DC

## 2021-12-08 NOTE — Telephone Encounter (Signed)
Pt states she took her last pill of the prescription listed below and is now out. Pt states she is confused about what she is supposed to be taking.  hydrochlorothiazide (HYDRODIURIL) 12.5 MG tablet [[id number]]  ENDED  Pt requests a call back asap.  Preferred pharmacy: [place] [place] [place] [place] - [address]  [address] [facility] [place] [address]  Phone:  [phone]  Fax:  [phone]

## 2021-12-08 NOTE — Telephone Encounter (Signed)
Patient notified and verbalized understanding. Patient aware she is to stop HCTZ and take lasix and will keep follow-up on 12/20/2021.

## 2021-12-09 ENCOUNTER — Encounter: Payer: Self-pay | Admitting: Physician Assistant

## 2021-12-09 ENCOUNTER — Ambulatory Visit: Payer: Medicare HMO | Admitting: Physician Assistant

## 2021-12-09 DIAGNOSIS — M5416 Radiculopathy, lumbar region: Secondary | ICD-10-CM | POA: Diagnosis not present

## 2021-12-09 NOTE — Progress Notes (Addendum)
Office Visit Note   Patient: Tracey Saunders           Date of Birth: [birth year]           MRN: [id number] Visit Date: 12/09/2021              Requested by: Tracey Crook, MD Tracey Saunders,  [place] [id number] PCP: Tracey Crook, MD   Assessment & Plan: Visit Diagnoses:  1. Lumbar radicular pain     Plan: Given her failure of conservative measures which included time medications and physical therapy and continuing to do home exercise program recommend MRI to rule out HNP as the source of her radicular symptoms down the right leg.  Have her follow-up after the MRI to go over results and discuss further treatment.  Patient is unable to take Tylenol or NSAIDs secondary to her heart.  Follow-Up Instructions: No follow-ups on file.   Orders:  No orders of the defined types were placed in this encounter.  No orders of the defined types were placed in this encounter.     Procedures: No procedures performed   Clinical Data: No additional findings.   Subjective: Chief Complaint  Patient presents with   Neck - Pain, Follow-up   Lower Back - Pain, Follow-up    HPI Tracey Saunders returns today with neck and back pain.  She is mostly having back pain is having numbness tingling down both legs to her feet.  She did go to formal therapy and continues home exercise program as shown.  She feels she had no relief from the back pain or neck pain.  She was denied an MRI of her lumbar spine.  She states that the pain is worse on the right mid to lower back and worse with standing.  Notes that her right foot goes numb.  Back pain does awaken her.  Denies any bowel or bladder dysfunction saddle anesthesia like symptoms or fevers.  She also has had lower leg edema her primary care physician stopped her amlodipine and HCTZ and started on Lasix and potassium.  She is currently taking gabapentin he does not feel that this helps her back or neck.   Review of Systems   Constitutional:  Negative for fever.  Musculoskeletal:  Positive for back pain, gait problem and neck pain.    Objective: Vital Signs: There were no vitals taken for this visit.  Physical Exam Constitutional:      Appearance: She is not ill-appearing or diaphoretic.  Pulmonary:     Effort: Pulmonary effort is normal.  Neurological:     Mental Status: She is alert and oriented to person, place, and time.  Psychiatric:        Mood and Affect: Mood normal.    Ortho Exam Bilateral lower extremities 5 out of 5 strength throughout against resistance.  Positive straight leg raise.  Tenderness right lower lumbar paraspinous region. Specialty Comments:  No specialty comments available.  Imaging: No results found.   PMFS History: Patient Active Problem List   Diagnosis Date Noted   Chronic right-sided low back pain with sciatica 06/22/2020   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Tracey Saunders) 03/12/2020   Headache, drug induced 03/12/2020   Hair thinning 11/12/2019   Recurrent major depressive disorder, in full remission (Tracey Saunders) 11/04/2019   Gastroesophageal reflux disease 09/09/2019   Insomnia 09/09/2019   Anxiety disorder 09/09/2019   Essential hypertension 06/03/2019   Hyperlipidemia 06/03/2019   History of cancer 04/29/2019   Vitamin  B12 deficiency 04/29/2019   Vitamin D deficiency 04/29/2019   Past Medical History:  Diagnosis Date   Anxiety disorder 09/09/2019   Cancer (Tracey Saunders)    cervical cancer   Depression    Diverticulitis    Fatty liver    GERD (gastroesophageal reflux disease)    Hyperlipidemia    Hypertension    Insomnia 09/09/2019   Liver cyst    Vitamin D deficiency     Family History  Problem Relation Age of Onset   Colon cancer Neg Hx    Esophageal cancer Neg Hx    Stomach cancer Neg Hx    Rectal cancer Neg Hx     Past Surgical History:  Procedure Laterality Date   ABDOMINAL HYSTERECTOMY     COLON RESECTION     due to diverticulitis   Social History   Occupational History    Occupation: retired  Tobacco Use   Smoking status: Light Smoker    Packs/day: 0.50    Types: Cigarettes   Smokeless tobacco: Never  Vaping Use   Vaping Use: Never used  Substance and Sexual Activity   Alcohol use: Yes    Comment: occasional beer or bloody mary once a week   Drug use: Never   Sexual activity: Not on file

## 2021-12-10 ENCOUNTER — Other Ambulatory Visit: Payer: Self-pay | Admitting: *Deleted

## 2021-12-10 NOTE — Addendum Note (Signed)
Addended by: Robyne Peers on: 12/10/2021 09:11 AM   Modules accepted: Orders

## 2021-12-13 ENCOUNTER — Ambulatory Visit: Payer: Medicare HMO | Admitting: Family Medicine

## 2021-12-15 ENCOUNTER — Encounter: Payer: Self-pay | Admitting: Family Medicine

## 2021-12-15 ENCOUNTER — Ambulatory Visit (INDEPENDENT_AMBULATORY_CARE_PROVIDER_SITE_OTHER): Payer: Medicare HMO | Admitting: Family Medicine

## 2021-12-15 VITALS — BP 130/78 | HR 67 | Temp 97.7°F | Ht 61.0 in | Wt 146.4 lb

## 2021-12-15 DIAGNOSIS — J4489 Other specified chronic obstructive pulmonary disease: Secondary | ICD-10-CM | POA: Insufficient documentation

## 2021-12-15 DIAGNOSIS — I10 Essential (primary) hypertension: Secondary | ICD-10-CM | POA: Diagnosis not present

## 2021-12-15 DIAGNOSIS — E538 Deficiency of other specified B group vitamins: Secondary | ICD-10-CM

## 2021-12-15 DIAGNOSIS — J449 Chronic obstructive pulmonary disease, unspecified: Secondary | ICD-10-CM

## 2021-12-15 DIAGNOSIS — D72829 Elevated white blood cell count, unspecified: Secondary | ICD-10-CM

## 2021-12-15 DIAGNOSIS — R69 Illness, unspecified: Secondary | ICD-10-CM | POA: Diagnosis not present

## 2021-12-15 DIAGNOSIS — R600 Localized edema: Secondary | ICD-10-CM | POA: Diagnosis not present

## 2021-12-15 DIAGNOSIS — R220 Localized swelling, mass and lump, head: Secondary | ICD-10-CM

## 2021-12-15 DIAGNOSIS — F3342 Major depressive disorder, recurrent, in full remission: Secondary | ICD-10-CM

## 2021-12-15 MED ORDER — POTASSIUM CHLORIDE CRYS ER 20 MEQ PO TBCR: 20.0000 meq | EXTENDED_RELEASE_TABLET | Freq: Two times a day (BID) | ORAL | 1 refills | Status: DC

## 2021-12-15 MED ORDER — FUROSEMIDE 20 MG PO TABS: 20.0000 mg | ORAL_TABLET | Freq: Two times a day (BID) | ORAL | 3 refills | Status: DC

## 2021-12-15 NOTE — Progress Notes (Signed)
Subjective:     Patient ID: Tracey Saunders, female    DOB: 1955-12-13, 66 y.o.   MRN: [id number]  Chief Complaint  Patient presents with   Follow-up    4 week follow-up discuss medications Fasting for blood work     HPI  HTN-on lasix '30mg'$  daily-higher in am but wakes up in pain.  130's/70's.  Occ up Edema-better on lasix '30mg'$  but still gets some.  Pain back-PT not help.  MRI sch.   B12 deficiency-on liquid vits. Elevated wbc-need to repeat.  Health Maintenance Due  Topic Date Due   Hepatitis C Screening  Never done   MAMMOGRAM  Never done   DEXA SCAN  Never done    Past Medical History:  Diagnosis Date   Anxiety disorder 09/09/2019   Cancer ([place])    cervical cancer   Depression    Diverticulitis    Fatty liver    GERD (gastroesophageal reflux disease)    Hyperlipidemia    Hypertension    Insomnia 09/09/2019   Liver cyst    Vitamin D deficiency     Past Surgical History:  Procedure Laterality Date   ABDOMINAL HYSTERECTOMY     COLON RESECTION     due to diverticulitis    Outpatient Medications Prior to Visit  Medication Sig Dispense Refill   albuterol (VENTOLIN HFA) 108 (90 Base) MCG/ACT inhaler Inhale 2 puffs into the lungs every 6 (six) hours as needed for wheezing or shortness of breath. 1 each 2   atorvastatin (LIPITOR) 20 MG tablet Take 1 tablet (20 mg total) by mouth daily. 90 tablet 1   fluticasone (FLONASE) 50 MCG/ACT nasal spray Place 2 sprays into both nostrils daily. 16 g 1   gabapentin (NEURONTIN) 300 MG capsule Take 2 capsules (600 mg total) by mouth 3 (three) times daily. 270 capsule 1   loratadine (CLARITIN) 10 MG tablet Take 1 tablet (10 mg total) by mouth daily. 90 tablet 1   montelukast (SINGULAIR) 10 MG tablet TAKE 1 TABLET BY MOUTH EVERYDAY AT BEDTIME 90 tablet 3   sertraline (ZOLOFT) 50 MG tablet TAKE 1 TABLET AT BEDTIME 90 tablet 0   tizanidine (ZANAFLEX) 2 MG capsule Take 1 capsule (2 mg total) by mouth 3 (three) times daily. 30 capsule 0    traZODone (DESYREL) 50 MG tablet Take 2 tablets (100 mg total) by mouth at bedtime as needed for sleep. 180 tablet 1   triamcinolone ointment (KENALOG) 0.1 % Apply 1 application topically 2 (two) times daily. 30 g 0   furosemide (LASIX) 20 MG tablet Take 1 tablet (20 mg total) by mouth daily. (Patient taking differently: Take 20 mg by mouth daily.) 30 tablet 3   potassium chloride SA (KLOR-CON M) 20 MEQ tablet Take 1 tablet (20 mEq total) by mouth daily. (Patient taking differently: Take 20 mEq by mouth daily.) 30 tablet 1   dicyclomine (BENTYL) 10 MG capsule Take 1 capsule (10 mg total) by mouth 4 (four) times daily -  before meals and at bedtime. 120 capsule 0   pantoprazole (PROTONIX) 20 MG tablet Take 1 tablet (20 mg total) by mouth daily. 90 tablet 1   hydrochlorothiazide (HYDRODIURIL) 12.5 MG tablet Take 1 tablet (12.5 mg total) by mouth daily. 90 tablet 0   No facility-administered medications prior to visit.    Allergies  Allergen Reactions   Aleve-D Sinus & Cold [Pseudoephedrine-Naproxen Na Er]    Losartan Swelling   ROS neg/noncontributory except as noted HPI/below Doesn't know why  gaining wt.   Still getting lip swelling at time-around gums and under lip-bottom or top or both.       Objective:     BP 130/78   Pulse 67   Temp 97.7 F (36.5 C) (Temporal)   Ht '5\' 1"'$  (1.549 m)   Wt 146 lb 6 oz (66.4 kg)   SpO2 98%   BMI 27.66 kg/m  Wt Readings from Last 3 Encounters:  12/15/21 146 lb 6 oz (66.4 kg)  11/22/21 148 lb 6 oz (67.3 kg)  10/12/21 141 lb 6 oz (64.1 kg)    Physical Exam   Gen: WDWN NAD WF HEENT: NCAT, conjunctiva not injected, sclera nonicteric NECK:  supple, no thyromegaly, no nodes, no carotid bruits CARDIAC: RRR, S1S2+, no murmur. DP 2+B LUNGS: CTAB. No wheezes ABDOMEN:  BS+, soft, NTND, No HSM, no masses EXT:  tr B edema at lateral maleoli MSK: in pain NEURO: A&O x3.  CN II-XII intact.  PSYCH: normal mood. Good eye contact     Assessment &  Plan:   Problem List Items Addressed This Visit       Cardiovascular and Mediastinum   Essential hypertension - Primary   Relevant Medications   furosemide (LASIX) 20 MG tablet   Other Relevant Orders   CBC with Differential/Platelet   Comprehensive metabolic panel   Urinalysis, Routine w reflex microscopic   Microalbumin / creatinine urine ratio     Other   Vitamin B12 deficiency   Relevant Orders   Vitamin B12   Other Visit Diagnoses     Leukocytosis, unspecified type       Relevant Orders   CBC with Differential/Platelet   Localized edema       Lip swelling         1.  Hypertension -chronic.  Well-controlled on Lasix.  Patient has been on multiple medications and has had either side effects or allergic reactions.  Doing well on Lasix 30 mg daily.  Will increase to 20 mg twice daily as she is having more edema later in the day.  Continue to monitor.  Check CMP.  UA, urine microalbumin creatinine ratio.  Follow-up in 1 month 2.  Localized edema-originally thought to be due to amlodipine so that was stopped.  Things have greatly improved, however she is also on Lasix.  There may be a component of neuropathy as well.  Advised to use compression stockings.  We will also change her Lasix to 20 mg twice daily as she is having more edema late in the day.  Told to take her Lasix first thing in the morning and around lunchtime.  Check BMP 3.  Vitamin B12 deficiency -patient is taking oral liquid B12.  She was getting B12 injections, however levels are really high at last appointment (but she had just had a B12 shot as well) will hold injection for now and recheck B12.  Continue oral supplements. 4.  Elevated WBCs -Mom had either leukemia or myelodysplastic syndrome.  Patient denies having been on steroids at the time.  Will recheck CBC with differential. 5.  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  At first it was thought to be due to ACE/ARB, but has been off of those for many months and  it is still occurring intermittently.  She cannot pinpoint a specific food or anything.  She will wake up in the morning with her lip swollen.  Not daily.  Will refer to allergist  Follow-up 1 months  Meds ordered this encounter  Medications   furosemide (LASIX) 20 MG tablet    Sig: Take 1 tablet (20 mg total) by mouth 2 (two) times daily.    Dispense:  180 tablet    Refill:  3   potassium chloride SA (KLOR-CON M) 20 MEQ tablet    Sig: Take 1 tablet (20 mEq total) by mouth 2 (two) times daily.    Dispense:  180 tablet    Refill:  1    Wellington Hampshire, MD

## 2021-12-15 NOTE — Patient Instructions (Addendum)
It was very nice to see you today!  Compression socks(travel socks) Furosemide and potassium whole tab twice daily-like morning and lunch   PLEASE NOTE:  If you had any lab tests please let us know if you have not heard back within a few days. You may see your results on MyChart before we have a chance to review them but we will give you a call once they are reviewed by [place]. If we ordered any referrals today, please let us know if you have not heard from their office within the next week.   Please try these tips to maintain a healthy lifestyle:  Eat most of your calories during the day when you are active. Eliminate processed foods including packaged sweets (pies, cakes, cookies), reduce intake of potatoes, white bread, white pasta, and white rice. Look for whole grain options, oat flour or almond flour.  Each meal should contain half fruits/vegetables, one quarter protein, and one quarter carbs (no bigger than a computer mouse).  Cut down on sweet beverages. This includes juice, soda, and sweet tea. Also watch fruit intake, though this is a healthier sweet option, it still contains natural sugar! Limit to 3 servings daily.  Drink at least 1 glass of water with each meal and aim for at least 8 glasses per day  Exercise at least 150 minutes every week.

## 2021-12-16 ENCOUNTER — Other Ambulatory Visit (INDEPENDENT_AMBULATORY_CARE_PROVIDER_SITE_OTHER): Payer: Medicare HMO

## 2021-12-16 ENCOUNTER — Other Ambulatory Visit: Payer: Self-pay

## 2021-12-16 DIAGNOSIS — I10 Essential (primary) hypertension: Secondary | ICD-10-CM

## 2021-12-16 LAB — URINALYSIS, ROUTINE W REFLEX MICROSCOPIC
Bilirubin Urine: NEGATIVE
Hgb urine dipstick: NEGATIVE
Ketones, ur: NEGATIVE
Leukocytes,Ua: NEGATIVE
Nitrite: NEGATIVE
RBC / HPF: NONE SEEN (ref 0–?)
Specific Gravity, Urine: <1.005 — AB (ref 1.000–1.030)
Total Protein, Urine: NEGATIVE
Urine Glucose: NEGATIVE
Urobilinogen, UA: 0.2 — AB (ref 0.0–1.0)
WBC, UA: NONE SEEN (ref 0–?)
pH: 6.5 (ref 5.0–8.0)

## 2021-12-16 LAB — MICROALBUMIN / CREATININE URINE RATIO
Creatinine,U: 9 mg/dL
Microalb Creat Ratio: 7.8 mg/g (ref 0.0–30.0)
Microalb, Ur: <0.7 mg/dL (ref 0.0–1.9)

## 2021-12-16 NOTE — Addendum Note (Signed)
Addended by: Doran Clay A on: 12/16/2021 08:31 AM   Modules accepted: Orders

## 2021-12-20 ENCOUNTER — Ambulatory Visit: Payer: Medicare HMO | Admitting: Family Medicine

## 2021-12-20 ENCOUNTER — Telehealth: Payer: Self-pay | Admitting: Family Medicine

## 2021-12-20 NOTE — Telephone Encounter (Signed)
..   Encourage patient to contact the pharmacy for refills or they can request refills through [facility]: 12/15/21  NEXT APPOINTMENT DATE: none  MEDICATION: furosemide (LASIX) 20 MG tablet potassium chloride SA (KLOR-CON M) 20 MEQ tablet  Is the patient out of medication? Yes  PHARMACY: [facility] ([place]), [place] - [address]  [address], [place] ([place]) [place] [address]  Phone:  [phone]  Fax:  [phone]  Patient states she was asked to have her PCP confirm her dosage has changed which is why her refills need to be more frequent.   Let patient know to contact pharmacy at the end of the day to make sure medication is ready.  Please notify patient to allow 48-72 hours to process

## 2021-12-20 NOTE — Telephone Encounter (Signed)
New Rx's were sent to the pharmacy on 12/15/2021. Refill not needed.

## 2021-12-21 ENCOUNTER — Ambulatory Visit
Admission: RE | Admit: 2021-12-21 | Discharge: 2021-12-21 | Disposition: A | Discharge status: Home / Self Care | Payer: Medicare HMO | Source: Ambulatory Visit | Attending: Physician Assistant | Admitting: Physician Assistant

## 2021-12-21 DIAGNOSIS — M5416 Radiculopathy, lumbar region: Secondary | ICD-10-CM

## 2021-12-21 DIAGNOSIS — M545 Low back pain, unspecified: Secondary | ICD-10-CM | POA: Diagnosis not present

## 2021-12-22 ENCOUNTER — Telehealth: Payer: Self-pay | Admitting: Family Medicine

## 2021-12-22 NOTE — Telephone Encounter (Signed)
Pt states she has a cough, sore throat, and runny nose.  Pt requests "something to be sent in" to the pharmacy for this.  Pt declined appointment with another [facility] provider.  FO Rep explained that PCP team may require an appointment/OV to get a prescription written. Pt stated understanding.   Preferred Pharmacy: [facility] [place]), [place] - [address]  [address] [facility] [place] [address]  Phone:  [phone]  Fax:  [phone]

## 2021-12-22 NOTE — Telephone Encounter (Signed)
Please advise 

## 2021-12-23 ENCOUNTER — Encounter: Payer: Self-pay | Admitting: Physician Assistant

## 2021-12-23 ENCOUNTER — Telehealth (INDEPENDENT_AMBULATORY_CARE_PROVIDER_SITE_OTHER): Payer: Medicare HMO | Admitting: Physician Assistant

## 2021-12-23 VITALS — Ht 61.0 in | Wt 140.0 lb

## 2021-12-23 DIAGNOSIS — R0981 Nasal congestion: Secondary | ICD-10-CM | POA: Diagnosis not present

## 2021-12-23 MED ORDER — AMOXICILLIN-POT CLAVULANATE 875-125 MG PO TABS: 1.0000 | ORAL_TABLET | Freq: Two times a day (BID) | ORAL | 0 refills | Status: DC

## 2021-12-23 NOTE — Progress Notes (Signed)
Virtual Visit via Video Note   I, Inda Coke, connected with  Tracey Saunders  ([id number], 12/19/1955) on 12/23/21 at  9:40 AM EDT by a video-enabled telemedicine application and verified that I am speaking with the correct person using two identifiers.  Location: Patient: Home Provider: [facility] office   I discussed the limitations of evaluation and management by telemedicine and the availability of in person appointments. The patient expressed understanding and agreed to proceed.    History of Present Illness: Tracey Saunders is a 66 y.o. who identifies as a female who was assigned female at birth, and is being seen today for sinus infection sx.  Cough x 4 weeks. Negative COVID-19 test. Had a fever at one point 100.3. Has overall improved symptoms but the congestion in her head will not improve. She is not taking medication for her symptoms.   Using albuterol nightly.  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   Headache, drug induced 03/12/2020   Hair thinning 11/12/2019   Recurrent major depressive disorder, in full remission ([place]) 11/04/2019   Gastroesophageal reflux disease 09/09/2019   Insomnia 09/09/2019   Anxiety disorder 09/09/2019   Essential hypertension 06/03/2019   Hyperlipidemia 06/03/2019   History of cancer 04/29/2019   Vitamin B12 deficiency 04/29/2019   Vitamin D deficiency 04/29/2019    Allergies:  Allergies  Allergen Reactions   Aleve-D Sinus & Cold [Pseudoephedrine-Naproxen Na Er]    Losartan Swelling   Medications:  Current Outpatient Medications:    amoxicillin-clavulanate (AUGMENTIN) 875-125 MG tablet, Take 1 tablet by mouth 2 (two) times daily., Disp: 14 tablet, Rfl: 0   atorvastatin (LIPITOR) 20 MG tablet,  Take 1 tablet (20 mg total) by mouth daily., Disp: 90 tablet, Rfl: 1   fluticasone (FLONASE) 50 MCG/ACT nasal spray, Place 2 sprays into both nostrils daily., Disp: 16 g, Rfl: 1   furosemide (LASIX) 20 MG tablet, Take 1 tablet (20 mg total) by mouth 2 (two) times daily., Disp: 180 tablet, Rfl: 3   gabapentin (NEURONTIN) 300 MG capsule, Take 2 capsules (600 mg total) by mouth 3 (three) times daily., Disp: 270 capsule, Rfl: 1   loratadine (CLARITIN) 10 MG tablet, Take 1 tablet (10 mg total) by mouth daily., Disp: 90 tablet, Rfl: 1   montelukast (SINGULAIR) 10 MG tablet, TAKE 1 TABLET BY MOUTH EVERYDAY AT BEDTIME, Disp: 90 tablet, Rfl: 3   potassium chloride SA (KLOR-CON M) 20 MEQ tablet, Take 1 tablet (20 mEq total) by mouth 2 (two) times daily., Disp: 180 tablet, Rfl: 1   sertraline (ZOLOFT) 50 MG tablet, TAKE 1 TABLET AT BEDTIME, Disp: 90 tablet, Rfl: 0   traZODone (DESYREL) 50 MG tablet, Take 2 tablets (100 mg total) by mouth at bedtime as needed for sleep., Disp: 180 tablet, Rfl: 1   albuterol (VENTOLIN HFA) 108 (90 Base) MCG/ACT inhaler, Inhale 2 puffs into the lungs every 6 (six) hours as needed for wheezing or shortness of breath. (Patient not taking: Reported on 12/23/2021), Disp: 1 each, Rfl: 2   dicyclomine (BENTYL) 10 MG capsule, Take 1 capsule (10 mg total) by mouth 4 (four) times daily -  before meals and at bedtime., Disp: 120 capsule, Rfl: 0   pantoprazole (PROTONIX) 20 MG tablet, Take 1 tablet (20 mg  total) by mouth daily., Disp: 90 tablet, Rfl: 1   tizanidine (ZANAFLEX) 2 MG capsule, Take 1 capsule (2 mg total) by mouth 3 (three) times daily. (Patient not taking: Reported on 12/23/2021), Disp: 30 capsule, Rfl: 0   triamcinolone ointment (KENALOG) 0.1 %, Apply 1 application topically 2 (two) times daily. (Patient not taking: Reported on 12/23/2021), Disp: 30 g, Rfl: 0  Observations/Objective: Patient is well-developed, well-nourished in no acute distress.  Resting comfortably  at home.   Head is normocephalic, atraumatic.  No labored breathing.  Speech is clear and coherent with logical content.  Patient is alert and oriented at baseline.    Assessment and Plan: 1. Sinus congestion No red flags on exam.  Will initiate augmentin per orders. Discussed taking medications as prescribed. Reviewed return precautions including worsening fever, SOB, worsening cough or other concerns. Push fluids and rest. I recommend that patient follow-up if symptoms worsen or persist despite treatment x 7-10 days, sooner if needed.   Follow Up Instructions: I discussed the assessment and treatment plan with the patient. The patient was provided an opportunity to ask questions and all were answered. The patient agreed with the plan and demonstrated an understanding of the instructions.  A copy of instructions were sent to the patient via MyChart unless otherwise noted below.   The patient was advised to call back or seek an in-person evaluation if the symptoms worsen or if the condition fails to improve as anticipated.  Inda Coke, [place]

## 2021-12-30 ENCOUNTER — Encounter: Payer: Self-pay | Admitting: Physician Assistant

## 2021-12-30 ENCOUNTER — Ambulatory Visit (INDEPENDENT_AMBULATORY_CARE_PROVIDER_SITE_OTHER): Payer: Medicare HMO | Admitting: Physician Assistant

## 2021-12-30 DIAGNOSIS — M5416 Radiculopathy, lumbar region: Secondary | ICD-10-CM

## 2021-12-30 NOTE — Progress Notes (Signed)
HPI: Ms. Fredenburg returns today for review of her lumbar MRI.  She continues to have low back pain and vague pain in both legs but primarily in the right leg.  She notes numbness down both legs tingling in both feet at times.  She has tried formal therapy, medications and prior epidural steroid injection done by another physician here in town some years ago.  She states that the pain in her right back and down the right leg are worse with standing. MRI brain.  12/22/2021 images reviewed with patient.  At L2-3 circumferential disc bulge with mild facet and ligamentum flavum hypertrophy.  Mild spinal stenosis and mild lateral recess stenosis.  L3-4 left eccentric circumferential disc bulge.  Borderline to mild left lateral recess stenosis.  Mild to moderate left neural foraminal stenosis.  L4-5 circumferential disc bulge slightly eccentric to the right.  Mild right L4 neural foraminal stenosis.   Impression: Low back pain with radicular symptoms  Plan: Given the fact that she has failed all conservative treatments and is primarily having right lateral leg radicular symptoms recommend right L4-5 foraminal injection.  We will see her back 2 weeks after the injection to go over results and discuss further treatment.  Questions were encouraged and answered at length.

## 2021-12-31 ENCOUNTER — Other Ambulatory Visit: Payer: Self-pay

## 2021-12-31 ENCOUNTER — Other Ambulatory Visit (INDEPENDENT_AMBULATORY_CARE_PROVIDER_SITE_OTHER): Payer: Medicare HMO

## 2021-12-31 DIAGNOSIS — E538 Deficiency of other specified B group vitamins: Secondary | ICD-10-CM | POA: Diagnosis not present

## 2021-12-31 DIAGNOSIS — R600 Localized edema: Secondary | ICD-10-CM

## 2021-12-31 DIAGNOSIS — I10 Essential (primary) hypertension: Secondary | ICD-10-CM

## 2021-12-31 LAB — COMPREHENSIVE METABOLIC PANEL
ALT: 13 U/L (ref 0–35)
AST: 15 U/L (ref 0–37)
Albumin: 4.3 g/dL (ref 3.5–5.2)
Alkaline Phosphatase: 72 U/L (ref 39–117)
BUN: 16 mg/dL (ref 6–23)
CO2: 27 mEq/L (ref 19–32)
Calcium: 9.2 mg/dL (ref 8.4–10.5)
Chloride: 103 mEq/L (ref 96–112)
Creatinine, Ser: 1.03 mg/dL (ref 0.40–1.20)
GFR: 56.23 mL/min — ABNORMAL LOW (ref >60.00)
Glucose, Bld: 106 mg/dL — ABNORMAL HIGH (ref 70–99)
Potassium: 4.1 mEq/L (ref 3.5–5.1)
Sodium: 139 mEq/L (ref 135–145)
Total Bilirubin: 0.4 mg/dL (ref 0.2–1.2)
Total Protein: 6.9 g/dL (ref 6.0–8.3)

## 2021-12-31 LAB — CBC WITH DIFFERENTIAL/PLATELET
Basophils Absolute: 0.1 10*3/uL (ref 0.0–0.1)
Basophils Relative: 1.4 % (ref 0.0–3.0)
Eosinophils Absolute: 0.2 10*3/uL (ref 0.0–0.7)
Eosinophils Relative: 1.9 % (ref 0.0–5.0)
HCT: 39.7 % (ref 36.0–46.0)
Hemoglobin: 13.5 g/dL (ref 12.0–15.0)
Lymphocytes Relative: 14.7 % (ref 12.0–46.0)
Lymphs Abs: 1.2 10*3/uL (ref 0.7–4.0)
MCHC: 33.9 g/dL (ref 30.0–36.0)
MCV: 94.1 fl (ref 78.0–100.0)
Monocytes Absolute: 0.7 10*3/uL (ref 0.1–1.0)
Monocytes Relative: 8.8 % (ref 3.0–12.0)
Neutro Abs: 5.8 10*3/uL (ref 1.4–7.7)
Neutrophils Relative %: 73.2 % (ref 43.0–77.0)
Platelets: 339 10*3/uL (ref 150.0–400.0)
RBC: 4.22 Mil/uL (ref 3.87–5.11)
RDW: 13.5 % (ref 11.5–15.5)
WBC: 7.9 10*3/uL (ref 4.0–10.5)

## 2021-12-31 LAB — VITAMIN B12: Vitamin B-12: 485 pg/mL (ref 211–911)

## 2021-12-31 NOTE — Telephone Encounter (Signed)
Pt states she received a voicemail about a $50 fee for a missed appointment.   FO Rep at [facility] could not find record of such contact.  Instructed pt to follow up with [facility] regarding referral message. Phone number and address given.  Pt stated understanding and intention to call.

## 2022-01-03 ENCOUNTER — Telehealth: Payer: Self-pay | Admitting: Family Medicine

## 2022-01-03 NOTE — Telephone Encounter (Signed)
Returned call to patient.

## 2022-01-04 NOTE — Telephone Encounter (Signed)
Spoke with patient and informed her that she didn't get a call yesterday due to waiting on response from PCP. Patient stated that her b/p has been running high since yesterday, so on yesterday she took her 2nd dose of lasix early after taking blood pressure and it was 135/100. She stated that after speaking with me she took b/p 2 more times: 12 pm: 148/65 and 3 pm: 122/92. Patient stated she too b/p at 1030 today and it was 148/92, would like to know if she should restart previous b/p med or take another lasix. Patient stated that she is feeling foggy.

## 2022-01-14 ENCOUNTER — Encounter: Payer: Self-pay | Admitting: Allergy

## 2022-01-14 ENCOUNTER — Ambulatory Visit: Payer: Medicare HMO | Admitting: Allergy

## 2022-01-14 VITALS — BP 134/86 | HR 76 | Temp 97.5°F | Resp 16 | Ht 61.0 in | Wt 143.1 lb

## 2022-01-14 DIAGNOSIS — R0982 Postnasal drip: Secondary | ICD-10-CM | POA: Diagnosis not present

## 2022-01-14 DIAGNOSIS — T783XXD Angioneurotic edema, subsequent encounter: Secondary | ICD-10-CM

## 2022-01-14 MED ORDER — AZELASTINE HCL 0.1 % NA SOLN: 2.0000 | Freq: Two times a day (BID) | NASAL | 5 refills | Status: AC

## 2022-01-14 MED ORDER — LEVOCETIRIZINE DIHYDROCHLORIDE 5 MG PO TABS
5.0000 mg | ORAL_TABLET | Freq: Every evening | ORAL | 5 refills | Status: DC
Start: 2022-01-14 — End: 2022-01-26

## 2022-01-14 NOTE — Progress Notes (Signed)
New Patient Note  RE: Tracey Saunders MRN: [id number] DOB: Nov 28, 1955 Date of Office Visit: 01/14/2022  Primary care provider: Tawnya Crook, MD  Chief Complaint: Swelling   History of present illness: Tracey Saunders is a 66 y.o. 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   She states she has been having lip swelling and "blistering on the inside" of her lip.  This started about 8 months ago.  She states when the swelling started she was on blood pressure medication, Losartan.  She is no longer on this medication.  It was changed to another medication she believes starts with a "byst" and states after taking this medication she had more swelling thus stopped this as well.  She is currently not on any BP medication at this time as she states she needs to go back to PCP to discuss this.   She states the lip swelling would occur couple times a week.  She is not sure if something is driving this swelling like an allergen.  She denies any hives on her skin but states the inside of her mouth gets itchy when she has these "blisters".  Denies trouble breathing, no GI or CV related symptoms.    She states her sinus medication is not working for her thus she stopped it and it was singulair.  She was on singulair for about 8 year.  She has also taken claritin which also was not helpful.  She states she feels like she has a clump of mucus stuck in her throat always.  It does interfere with sleep but she notices this all day.  She states her nose is always stuffy. Constantly throat clearing.  Reports a lot of sneezing. No itchy/watery eyes.  She uses flonase "often" and does seem to help a bit.    She does have an albuterol inhaler but does not recall why she has this but does state a history of asthma and bronchitis that seems to "kick in" during the winter.   No history of eczema but states sometimes she has to scratch her fingers/hands due to itch.    Review of systems: Review of Systems   Constitutional: Negative.   HENT:         See HPI  Eyes: Negative.   Respiratory: Negative.    Cardiovascular: Negative.   Gastrointestinal: Negative.   Musculoskeletal: Negative.   Skin: Negative.   Allergic/Immunologic: Negative.   Neurological: Negative.     All other systems negative unless noted above in HPI  Past medical history: Past Medical History:  Diagnosis Date   Anxiety disorder 09/09/2019   Cancer ([place])    cervical cancer   Depression    Diverticulitis    Fatty liver    GERD (gastroesophageal reflux disease)    Hyperlipidemia    Hypertension    Insomnia 09/09/2019   Liver cyst    Vitamin D deficiency     Past surgical history: Past Surgical History:  Procedure Laterality Date   ABDOMINAL HYSTERECTOMY     COLON RESECTION     due to diverticulitis    Family history:  Family History  Problem Relation Age of Onset   Colon cancer Neg Hx    Esophageal cancer Neg Hx    Stomach cancer Neg Hx    Rectal cancer Neg Hx     Social history: Lives in an apartment with carpeting with electric heating and central cooling.  There is a dog in the home.  There  is no concern for water damage, mildew or roaches in the home.  She is retired. Tobacco Use   Smoking status: Light Smoker    Packs/day: 0.50    Types: Cigarettes   Smokeless tobacco: Never  Vaping Use   Vaping Use: Never used   Medication List: Current Outpatient Medications  Medication Sig Dispense Refill   albuterol (VENTOLIN HFA) 108 (90 Base) MCG/ACT inhaler Inhale 2 puffs into the lungs every 6 (six) hours as needed for wheezing or shortness of breath. 1 each 2   atorvastatin (LIPITOR) 20 MG tablet Take 1 tablet (20 mg total) by mouth daily. 90 tablet 1   dicyclomine (BENTYL) 10 MG capsule Take 1 capsule (10 mg total) by mouth 4 (four) times daily -  before meals and at bedtime. 120 capsule 0   fluticasone (FLONASE) 50 MCG/ACT nasal spray Place 2 sprays into both nostrils daily. 16 g 1    furosemide (LASIX) 20 MG tablet Take 1 tablet (20 mg total) by mouth 2 (two) times daily. 180 tablet 3   gabapentin (NEURONTIN) 300 MG capsule Take 2 capsules (600 mg total) by mouth 3 (three) times daily. 270 capsule 1   potassium chloride SA (KLOR-CON M) 20 MEQ tablet Take 1 tablet (20 mEq total) by mouth 2 (two) times daily. 180 tablet 1   sertraline (ZOLOFT) 50 MG tablet TAKE 1 TABLET AT BEDTIME 90 tablet 0   tizanidine (ZANAFLEX) 2 MG capsule Take 1 capsule (2 mg total) by mouth 3 (three) times daily. 30 capsule 0   traZODone (DESYREL) 50 MG tablet Take 2 tablets (100 mg total) by mouth at bedtime as needed for sleep. 180 tablet 1   triamcinolone ointment (KENALOG) 0.1 % Apply 1 application topically 2 (two) times daily. 30 g 0   loratadine (CLARITIN) 10 MG tablet Take 1 tablet (10 mg total) by mouth daily. (Patient not taking: Reported on 01/14/2022) 90 tablet 1   montelukast (SINGULAIR) 10 MG tablet TAKE 1 TABLET BY MOUTH EVERYDAY AT BEDTIME (Patient not taking: Reported on 01/14/2022) 90 tablet 3   pantoprazole (PROTONIX) 20 MG tablet Take 1 tablet (20 mg total) by mouth daily. (Patient not taking: Reported on 01/14/2022) 90 tablet 1   No current facility-administered medications for this visit.    Known medication allergies: Allergies  Allergen Reactions   Aleve-D Sinus & Cold [Pseudoephedrine-Naproxen Na Er]    Losartan Swelling     Physical examination: Blood pressure 134/86, pulse 76, temperature (!) 97.5 F (36.4 C), resp. rate 16, height '5\' 1"'$  (1.549 m), weight 143 lb 2 oz (64.9 kg), SpO2 96 %.  General: Alert, interactive, in no acute distress. HEENT: PERRLA, TMs pearly gray, turbinates non-edematous without discharge, post-pharynx non erythematous. Neck: Supple without lymphadenopathy. Lungs: Clear to auscultation without wheezing, rhonchi or rales. {no increased work of breathing. CV: Normal S1, S2 without murmurs. Abdomen: Nondistended, nontender. Skin: Warm and dry,  without lesions or rashes. Extremities:  No clubbing, cyanosis or edema. Neuro:   Grossly intact.  Diagnositics/Labs:  Component     Latest Ref Rng 12/31/2021  WBC     4.0 - 10.5 K/uL 7.9   RBC     3.87 - 5.11 Mil/uL 4.22   Hemoglobin     12.0 - 15.0 g/dL 13.5   HCT     36.0 - 46.0 % 39.7   MCV     78.0 - 100.0 fl 94.1   MCHC     30.0 - 36.0 g/dL 33.9  RDW     11.5 - 15.5 % 13.5   Platelets     150.0 - 400.0 K/uL 339.0   Neutrophils     43.0 - 77.0 % 73.2   Lymphocytes     12.0 - 46.0 % 14.7   Monocytes Relative     3.0 - 12.0 % 8.8   Eosinophil     0.0 - 5.0 % 1.9   Basophil     0.0 - 3.0 % 1.4   NEUT#     1.4 - 7.7 K/uL 5.8   Lymphocyte #     0.7 - 4.0 K/uL 1.2   Monocyte #     0.1 - 1.0 K/uL 0.7   Eosinophils Absolute     0.0 - 0.7 K/uL 0.2   Basophils Absolute     0.0 - 0.1 K/uL 0.1   Sodium     135 - 145 mEq/L 139   Potassium     3.5 - 5.1 mEq/L 4.1   Chloride     96 - 112 mEq/L 103   CO2     19 - 32 mEq/L 27   Glucose     70 - 99 mg/dL 106 (H)   BUN     6 - 23 mg/dL 16   Creatinine     0.40 - 1.20 mg/dL 1.03   Total Bilirubin     0.2 - 1.2 mg/dL 0.4   Alkaline Phosphatase     39 - 117 U/L 72   AST     0 - 37 U/L 15   ALT     0 - 35 U/L 13   Total Protein     6.0 - 8.3 g/dL 6.9   Albumin     3.5 - 5.2 g/dL 4.3   GFR     >60.00 mL/min 56.23 (L)   Calcium     8.4 - 10.5 mg/dL 9.2   Vitamin B12     211 - 911 pg/mL 485     Assessment and plan:   Swelling (angioedema) - episodes of mouth/lip swelling.    - Losartan (Arb group medication) is a medication with a known side effect of causing lip/mouth swelling.  Agree with you not taking this medication again.  You should also avoid the ACE- inhibitor group of medications like Lisinopril as this group is known to cause swelling as well.   - it is likely to continue to have swelling episodes even months after stopping the above medications.  - due to continued symptoms will obtain  labwork for hereditary angioedema as well as tryptase level and environmental allergy panel - to help with swelling recommend you take either Xyzal or Allegra 1 tab daily at this time  Environmental allergy - sensation of clump in throat is likely due to environmental allergies - will obtain environmental allergy panel - take Xyzal or Allegra as above - for nasal drainage start use of Astelin 2 sprays each nostril twice a day.  This is a nasal antihistamine good for nasal drainage control - you can still use Flonase for nasal congestion 2 sprays each nostril daily for 1-2 weeks at a time before stopping once nasal congestion improves for maximum benefit  Follow-up 2-3 months or sooner if needed  I appreciate the opportunity to take part in Shakala's care. Please do not hesitate to contact me with questions.  Sincerely,   Prudy Feeler, MD Allergy/Immunology [facility]

## 2022-01-14 NOTE — Patient Instructions (Addendum)
Swelling (angioedema) - episodes of mouth/lip swelling.    - Losartan (Arb group medication) is a medication with a known side effect of causing lip/mouth swelling.  Agree with you not taking this medication again.  You should also avoid the ACE- inhibitor group of medications like Lisinopril as this group is known to cause swelling as well.   - it is likely to continue to have swelling episodes even months after stopping the above medications.  - due to continued symptoms will obtain labwork for hereditary angioedema as well as tryptase level and environmental allergy panel - to help with swelling recommend you take either Xyzal or Allegra 1 tab daily at this time  Environmental allergy - sensation of clump in throat is likely due to environmental allergies - will obtain environmental allergy panel - take Xyzal or Allegra as above - for nasal drainage start use of Astelin 2 sprays each nostril twice a day.  This is a nasal antihistamine good for nasal drainage control - you can still use Flonase for nasal congestion 2 sprays each nostril daily for 1-2 weeks at a time before stopping once nasal congestion improves for maximum benefit  Follow-up 2-3 months or sooner if needed

## 2022-01-19 ENCOUNTER — Telehealth: Payer: Self-pay | Admitting: Family Medicine

## 2022-01-19 NOTE — Telephone Encounter (Signed)
FYI Patient called stating that following her visit with Dr. Nelva Bush, allergy/immunology on 01/14/22 she had some lab work completed that was resulted on 01/17/22. Pt states that she saw these results and could not understand what she was looking at. Although she tried calling Dr. Jeralyn Ruths office for some clarification, she was unable to reach them. At this time she wanted to know if Dr. Cherlynn Kaiser was able to explain these to her. I informed her that her specialist had not seen the results yet and recommended she give a little more time for her specialist to reach out in regards to this. Pt verbalized understanding.

## 2022-01-20 ENCOUNTER — Telehealth: Payer: Self-pay

## 2022-01-20 NOTE — Telephone Encounter (Signed)
Noted  

## 2022-01-20 NOTE — Telephone Encounter (Signed)
Called patient - advised of provider notation below....  Please let her know I'm still waiting for them all to return to be able to interpret the swelling panel.   If she can be patient while I wait on the last test to return will result them all together.     Patient verbalized understanding, no further questions.

## 2022-01-20 NOTE — Telephone Encounter (Addendum)
Patient called in -DOB verified - requesting explanation of lab results. Patient was advised the provider is seeing patients, once she has had a chance to review results that resulted on 01/17/22  - hopefully by the end of the day or tomorrow - and make recommendations, she will be contacted by our office but can view via myChart.   Patient verbalized understanding, no further questions.

## 2022-01-21 LAB — ALLERGENS W/TOTAL IGE AREA 2
Alternaria Alternata IgE: <0.1 kU/L
Aspergillus Fumigatus IgE: <0.1 kU/L
Bermuda Grass IgE: <0.1 kU/L
Cat Dander IgE: <0.1 kU/L
Cedar, Mountain IgE: <0.1 kU/L
Cladosporium Herbarum IgE: <0.1 kU/L
Cockroach, German IgE: <0.1 kU/L
Common Silver Birch IgE: <0.1 kU/L
Cottonwood IgE: <0.1 kU/L
D Farinae IgE: <0.1 kU/L
D Pteronyssinus IgE: <0.1 kU/L
Dog Dander IgE: <0.1 kU/L
Elm, American IgE: <0.1 kU/L
IgE (Immunoglobulin E), Serum: 693 IU/mL — ABNORMAL HIGH (ref 6–495)
Johnson Grass IgE: 0.94 kU/L — AB
Maple/Box Elder IgE: <0.1 kU/L
Mouse Urine IgE: <0.1 kU/L
Oak, White IgE: <0.1 kU/L
Pecan, Hickory IgE: <0.1 kU/L
Penicillium Chrysogen IgE: <0.1 kU/L
Pigweed, Rough IgE: <0.1 kU/L
Ragweed, Short IgE: <0.1 kU/L
Sheep Sorrel IgE Qn: <0.1 kU/L
Timothy Grass IgE: <0.1 kU/L
White Mulberry IgE: <0.1 kU/L

## 2022-01-21 LAB — C4 COMPLEMENT: Complement C4, Serum: 36 mg/dL (ref 12–38)

## 2022-01-21 LAB — C1 ESTERASE INHIBITOR: C1INH SerPl-mCnc: 48 mg/dL — ABNORMAL HIGH (ref 21–39)

## 2022-01-21 LAB — COMPLEMENT COMPONENT C1Q: Complement C1Q: 13.8 mg/dL (ref 10.3–20.5)

## 2022-01-21 LAB — C1 ESTERASE INHIBITOR, FUNCTIONAL: C1INH Functional/C1INH Total MFr SerPl: >93 %mean normal

## 2022-01-21 LAB — TRYPTASE: Tryptase: 3.6 ug/L (ref 2.2–13.2)

## 2022-01-26 ENCOUNTER — Other Ambulatory Visit: Payer: Self-pay

## 2022-01-26 MED ORDER — LEVOCETIRIZINE DIHYDROCHLORIDE 5 MG PO TABS: 5.0000 mg | ORAL_TABLET | Freq: Two times a day (BID) | ORAL | 4 refills | Status: DC | PRN

## 2022-01-28 ENCOUNTER — Telehealth: Payer: Self-pay | Admitting: Family Medicine

## 2022-01-28 NOTE — Telephone Encounter (Signed)
Please see note below. 

## 2022-01-28 NOTE — Telephone Encounter (Signed)
Patient states the RX for sertraline (ZOLOFT) 50 MG tablet has the wrong dosage on the bottle. Patient states she takes 1 1/2 tablets at night.   Patient states instructions on bottle say take 1 tablet at night. Patient requests a new RX with the instructions of dosage "take 1 1/2 tablets at night" be sent to the Pharmacy listed below.  Patient requests 90 day supply RX's for all of the auto refill medications prescribed by Provider. Patient did state Trazadone was one RX that is needed and that Provider would know which medications are needed:   [facility], [place] to [address] [place] [address] Phone:  [phone]  Fax:  [phone]

## 2022-01-29 ENCOUNTER — Other Ambulatory Visit: Payer: Self-pay | Admitting: Family Medicine

## 2022-01-29 MED ORDER — SERTRALINE HCL 50 MG PO TABS
75.0000 mg | ORAL_TABLET | Freq: Every day | ORAL | 3 refills | Status: DC
Start: 2022-01-29 — End: 2022-09-30

## 2022-01-29 MED ORDER — POTASSIUM CHLORIDE CRYS ER 20 MEQ PO TBCR
20.0000 meq | EXTENDED_RELEASE_TABLET | Freq: Two times a day (BID) | ORAL | 1 refills | Status: DC
Start: 2022-01-29 — End: 2022-08-11

## 2022-01-29 MED ORDER — ATORVASTATIN CALCIUM 20 MG PO TABS: 20.0000 mg | ORAL_TABLET | Freq: Every day | ORAL | 1 refills | Status: DC

## 2022-01-29 MED ORDER — GABAPENTIN 300 MG PO CAPS: 600.0000 mg | ORAL_CAPSULE | Freq: Three times a day (TID) | ORAL | 1 refills | Status: DC

## 2022-01-29 MED ORDER — FUROSEMIDE 20 MG PO TABS: 20.0000 mg | ORAL_TABLET | Freq: Two times a day (BID) | ORAL | 3 refills | Status: DC

## 2022-01-31 NOTE — Telephone Encounter (Signed)
Rx's sent to the pharmacy. 

## 2022-02-03 ENCOUNTER — Ambulatory Visit: Payer: Medicare HMO | Admitting: Physical Medicine and Rehabilitation

## 2022-02-03 ENCOUNTER — Ambulatory Visit: Payer: Self-pay

## 2022-02-03 ENCOUNTER — Encounter: Payer: Self-pay | Admitting: Physical Medicine and Rehabilitation

## 2022-02-03 VITALS — BP 121/78 | HR 86

## 2022-02-03 DIAGNOSIS — M5416 Radiculopathy, lumbar region: Secondary | ICD-10-CM | POA: Diagnosis not present

## 2022-02-03 MED ORDER — METHYLPREDNISOLONE ACETATE 80 MG/ML IJ SUSP
80.0000 mg | Freq: Once | INTRAMUSCULAR | Status: AC
  Administered 2022-02-03: 80 mg

## 2022-02-03 NOTE — Progress Notes (Signed)
Pt state lower back pain that travels down both legs. Pt state she has swollen legs and ankles. Pt state walking, standing and bending makes the pain worse. Pt state she takes pain meds and uses heat and ice to help ease her pain.  Numeric Pain Rating Scale and Functional Assessment Average Pain 9   In the last MONTH (on 0-10 scale) has pain interfered with the following?  1. General activity like being  able to carry out your everyday physical activities such as walking, climbing stairs, carrying groceries, or moving a chair?  Rating(10)   +Driver, -BT, -Dye Allergies.

## 2022-02-03 NOTE — Patient Instructions (Signed)

## 2022-02-04 ENCOUNTER — Encounter: Payer: Self-pay | Admitting: Family Medicine

## 2022-02-04 ENCOUNTER — Ambulatory Visit (INDEPENDENT_AMBULATORY_CARE_PROVIDER_SITE_OTHER): Payer: Medicare HMO | Admitting: Family Medicine

## 2022-02-04 VITALS — BP 130/80 | HR 70 | Temp 98.0°F | Ht 61.0 in | Wt 146.5 lb

## 2022-02-04 DIAGNOSIS — G8929 Other chronic pain: Secondary | ICD-10-CM | POA: Diagnosis not present

## 2022-02-04 DIAGNOSIS — R5383 Other fatigue: Secondary | ICD-10-CM | POA: Diagnosis not present

## 2022-02-04 DIAGNOSIS — M5441 Lumbago with sciatica, right side: Secondary | ICD-10-CM

## 2022-02-04 DIAGNOSIS — I2510 Atherosclerotic heart disease of native coronary artery without angina pectoris: Secondary | ICD-10-CM

## 2022-02-04 DIAGNOSIS — I10 Essential (primary) hypertension: Secondary | ICD-10-CM | POA: Diagnosis not present

## 2022-02-04 MED ORDER — GABAPENTIN 300 MG PO CAPS: 600.0000 mg | ORAL_CAPSULE | Freq: Three times a day (TID) | ORAL | 1 refills | Status: DC

## 2022-02-04 NOTE — Patient Instructions (Signed)
It was very nice to see you today!  Will refer to cardiology   PLEASE NOTE:  If you had any lab tests please let us know if you have not heard back within a few days. You may see your results on MyChart before we have a chance to review them but we will give you a call once they are reviewed by [place]. If we ordered any referrals today, please let us know if you have not heard from their office within the next week.   Please try these tips to maintain a healthy lifestyle:  Eat most of your calories during the day when you are active. Eliminate processed foods including packaged sweets (pies, cakes, cookies), reduce intake of potatoes, white bread, white pasta, and white rice. Look for whole grain options, oat flour or almond flour.  Each meal should contain half fruits/vegetables, one quarter protein, and one quarter carbs (no bigger than a computer mouse).  Cut down on sweet beverages. This includes juice, soda, and sweet tea. Also watch fruit intake, though this is a healthier sweet option, it still contains natural sugar! Limit to 3 servings daily.  Drink at least 1 glass of water with each meal and aim for at least 8 glasses per day  Exercise at least 150 minutes every week.

## 2022-02-04 NOTE — Progress Notes (Signed)
Subjective:     Patient ID: Tracey Saunders, female    DOB: 05/12/1956, 66 y.o.   MRN: [id number]  Chief Complaint  Patient presents with   Follow-up    Follow-up on blood pressure and headaches     HPI  HTN-Pt is on  lasix '20mg'$  bid.  Bp's running  159-275/80-96.  No dizziness/cp/palp/cough/sob.   Occ HA.  Still some edema.    Back pain-taking gabapentin '600mg'$  tid.  Got injections yesterday.  Makes her tire easily d/t pain.   Health Maintenance Due  Topic Date Due   Hepatitis C Screening  Never done   MAMMOGRAM  Never done   DEXA SCAN  Never done    Past Medical History:  Diagnosis Date   Anxiety disorder 09/09/2019   Cancer ([place])    cervical cancer   Depression    Diverticulitis    Fatty liver    GERD (gastroesophageal reflux disease)    Hyperlipidemia    Hypertension    Insomnia 09/09/2019   Liver cyst    Vitamin D deficiency     Past Surgical History:  Procedure Laterality Date   ABDOMINAL HYSTERECTOMY     COLON RESECTION     due to diverticulitis    Outpatient Medications Prior to Visit  Medication Sig Dispense Refill   atorvastatin (LIPITOR) 20 MG tablet Take 1 tablet (20 mg total) by mouth daily. 90 tablet 1   azelastine (ASTELIN) 0.1 % nasal spray Place 2 sprays into both nostrils 2 (two) times daily. Use in each nostril as directed 30 mL 5   furosemide (LASIX) 20 MG tablet Take 1 tablet (20 mg total) by mouth 2 (two) times daily. 180 tablet 3   omeprazole (PRILOSEC) 40 MG capsule Take 1 tablet by mouth daily.     potassium chloride SA (KLOR-CON M) 20 MEQ tablet Take 1 tablet (20 mEq total) by mouth 2 (two) times daily. 180 tablet 1   sertraline (ZOLOFT) 50 MG tablet Take 1.5 tablets (75 mg total) by mouth at bedtime. 135 tablet 3   tizanidine (ZANAFLEX) 2 MG capsule Take 1 capsule (2 mg total) by mouth 3 (three) times daily. 30 capsule 0   traZODone (DESYREL) 50 MG tablet Take 2 tablets (100 mg total) by mouth at bedtime as needed for sleep. 180 tablet 1    gabapentin (NEURONTIN) 300 MG capsule Take 2 capsules (600 mg total) by mouth 3 (three) times daily. 270 capsule 1   albuterol (VENTOLIN HFA) 108 (90 Base) MCG/ACT inhaler Inhale 2 puffs into the lungs every 6 (six) hours as needed for wheezing or shortness of breath. 1 each 2   dicyclomine (BENTYL) 10 MG capsule Take 1 capsule (10 mg total) by mouth 4 (four) times daily -  before meals and at bedtime. 120 capsule 0   fluticasone (FLONASE) 50 MCG/ACT nasal spray Place 2 sprays into both nostrils daily. 16 g 1   levocetirizine (XYZAL) 5 MG tablet Take 1 tablet (5 mg total) by mouth 2 (two) times daily as needed for allergies. 60 tablet 4   montelukast (SINGULAIR) 10 MG tablet TAKE 1 TABLET BY MOUTH EVERYDAY AT BEDTIME 90 tablet 3   pantoprazole (PROTONIX) 20 MG tablet Take 1 tablet (20 mg total) by mouth daily. (Patient not taking: Reported on 01/14/2022) 90 tablet 1   triamcinolone ointment (KENALOG) 0.1 % Apply 1 application topically 2 (two) times daily. 30 g 0   Facility-Administered Medications Prior to Visit  Medication Dose Route Frequency Provider  Last Rate Last Admin   methylPREDNISolone acetate (DEPO-MEDROL) injection 80 mg  80 mg Other Once Magnus Sinning, MD        Allergies  Allergen Reactions   Aleve-D Sinus & Cold [Pseudoephedrine-Naproxen Na Er]    Lisinopril Swelling   Losartan Swelling   ROS neg/noncontributory except as noted HPI/below One day,  laid down and felt like "heart caved in".  NTG in past for EKG that showed "heart attack".  Told 2 yrs ago.       Objective:     BP 130/80 (BP Location: Left Arm, Patient Position: Sitting, Cuff Size: Normal)   Pulse 70   Temp 98 F (36.7 C) (Temporal)   Ht '5\' 1"'$  (1.549 m)   Wt 146 lb 8 oz (66.5 kg)   SpO2 96%   BMI 27.68 kg/m  Wt Readings from Last 3 Encounters:  02/04/22 146 lb 8 oz (66.5 kg)  01/14/22 143 lb 2 oz (64.9 kg)  12/23/21 140 lb (63.5 kg)    Physical Exam  pt cuff 155/84 134/82 a different  cuff.  Gen: WDWN NAD wf HEENT: NCAT, conjunctiva not injected, sclera nonicteric NECK:  supple, no thyromegaly, no nodes, no carotid bruits CARDIAC: RRR, S1S2+, no murmur.  LUNGS: CTAB. No wheezes ABDOMEN:  BS+, soft, NTND, No HSM, no masses EXT:  tr edema MSK: slow gait d/t leg/back pain  NEURO: A&O x3.  CN II-XII intact.  PSYCH: normal mood. Good eye contact     Assessment & Plan:   Problem List Items Addressed This Visit       Cardiovascular and Mediastinum   Essential hypertension - Primary   Relevant Orders   Ambulatory referral to Cardiology     Nervous and Auditory   Chronic right-sided low back pain with sciatica   Relevant Medications   gabapentin (NEURONTIN) 300 MG capsule   Other Visit Diagnoses     Other fatigue       Relevant Orders   Ambulatory referral to Cardiology   Coronary artery disease involving native coronary artery of native heart without angina pectoris       Relevant Orders   Ambulatory referral to Cardiology      HTN-chronic.  Controlled on Lasix '20mg'$  bid.  Pt cuff is not accurate.  Check when can at other places Fatigue-pain prob main factor.  Labs have been ok.  ?h/o CAD-refer back to card ?CAD-per pt but couldn't fine evidence.  Refer Card.   4.  Chronic LBP-seeing pain mgmt.  On 600tid gabapentin-adjusted quantities  F/u 2 mo  Meds ordered this encounter  Medications   gabapentin (NEURONTIN) 300 MG capsule    Sig: Take 2 capsules (600 mg total) by mouth 3 (three) times daily.    Dispense:  540 capsule    Refill:  1    Wellington Hampshire, MD

## 2022-02-07 NOTE — Progress Notes (Signed)
Tracey Saunders - 66 y.o. female MRN [id number]  Date of birth: 02/01/1956  Office Visit Note: Visit Date: 02/03/2022 PCP: Tawnya Crook, MD Referred by: Tawnya Crook, MD  Subjective: Chief Complaint  Patient presents with   Lower Back - Pain   Right Leg - Pain   Left Leg - Pain   HPI:  Tracey Saunders is a 66 y.o. female who comes in today at the request of Benita Stabile, PA-C for planned Bilateral L4-5 Lumbar Transforaminal epidural steroid injection with fluoroscopic guidance.  The patient has failed conservative care including home exercise, medications, time and activity modification.  This injection will be diagnostic and hopefully therapeutic.  Please see requesting physician notes for further details and justification. MRI reviewed with images and spine model.  MRI reviewed in the note below.    ROS Otherwise per HPI.  Assessment & Plan: Visit Diagnoses:    ICD-10-CM   1. Lumbar radiculopathy  M54.16 XR C-ARM NO REPORT    Epidural Steroid injection    methylPREDNISolone acetate (DEPO-MEDROL) injection 80 mg      Plan: No additional findings.   Meds & Orders:  Meds ordered this encounter  Medications   methylPREDNISolone acetate (DEPO-MEDROL) injection 80 mg    Orders Placed This Encounter  Procedures   XR C-ARM NO REPORT   Epidural Steroid injection    Follow-up: Return for visit to requesting provider as needed.   Procedures: No procedures performed  Lumbosacral Transforaminal Epidural Steroid Injection - Sub-Pedicular Approach with Fluoroscopic Guidance  Patient: Tracey Saunders      Date of Birth: 11/10/1955 MRN: [id number] PCP: Tawnya Crook, MD      Visit Date: 02/03/2022   Universal Protocol:    Date/Time: 02/03/2022  Consent Given By: the patient  Position: PRONE  Additional Comments: Vital signs were monitored before and after the procedure. Patient was prepped and draped in the usual sterile fashion. The correct patient, procedure, and site  was verified.   Injection Procedure Details:   Procedure diagnoses: Lumbar radiculopathy [M54.16]    Meds Administered:  Meds ordered this encounter  Medications   methylPREDNISolone acetate (DEPO-MEDROL) injection 80 mg    Laterality: Bilateral  Location/Site: L4  Needle:5.0 in., 22 ga.  Short bevel or Quincke spinal needle  Needle Placement: Transforaminal  Findings:    -Comments: Excellent flow of contrast along the nerve, nerve root and into the epidural space.  Procedure Details: After squaring off the end-plates to get a true AP view, the C-arm was positioned so that an oblique view of the foramen as noted above was visualized. The target area is just inferior to the "nose of the scotty dog" or sub pedicular. The soft tissues overlying this structure were infiltrated with 2-3 ml. of 1% Lidocaine without Epinephrine.  The spinal needle was inserted toward the target using a "trajectory" view along the fluoroscope beam.  Under AP and lateral visualization, the needle was advanced so it did not puncture dura and was located close the 6 O'Clock position of the pedical in AP tracterory. Biplanar projections were used to confirm position. Aspiration was confirmed to be negative for CSF and/or blood. A 1-2 ml. volume of Isovue-250 was injected and flow of contrast was noted at each level. Radiographs were obtained for documentation purposes.   After attaining the desired flow of contrast documented above, a 0.5 to 1.0 ml test dose of 0.25% Marcaine was injected into each respective transforaminal space.  The patient was observed for  90 seconds post injection.  After no sensory deficits were reported, and normal lower extremity motor function was noted,   the above injectate was administered so that equal amounts of the injectate were placed at each foramen (level) into the transforaminal epidural space.   Additional Comments:  The patient tolerated the procedure well Dressing: 2 x 2  sterile gauze and Band-Aid    Post-procedure details: Patient was observed during the procedure. Post-procedure instructions were reviewed.  Patient left the clinic in stable condition.    Clinical History: MRI LUMBAR SPINE WITHOUT CONTRAST   TECHNIQUE: Multiplanar, multisequence MR imaging of the lumbar spine was performed. No intravenous contrast was administered.   COMPARISON:  Lumbar radiographs 09/17/2021. CT Abdomen and Pelvis 12/01/2019.   FINDINGS: Segmentation:  Normal on the comparison radiographs.   Alignment: Mild straightening of lumbar lordosis has not significantly changed since 2021. There is subtle chronic retrolisthesis of L2 on L3.   Vertebrae: No marrow edema or evidence of acute osseous abnormality. Normal background bone marrow signal. Intact visible sacrum. Small chronic L3 inferior endplate Schmorl's node.   Conus medullaris and cauda equina: Conus extends to the T12-L1 level. No lower spinal cord or conus signal abnormality.   Paraspinal and other soft tissues: Negative.   Disc levels:   Visible lower thoracic levels through L1-L2 are normal for age.   L2-L3: Disc space loss. Circumferential disc bulge. Mild facet and ligament flavum hypertrophy. Trace degenerative facet joint fluid. Mild spinal stenosis (series 6, image 17). Mild associated lateral recess stenosis (L3 nerve levels). No convincing foraminal stenosis.   L3-L4: Disc desiccation and disc space loss with left eccentric circumferential disc bulge. Mild facet and ligament flavum hypertrophy. Broad-based left foraminal component. No spinal stenosis. Borderline to mild left lateral recess stenosis (left L4 nerve level). Mild to moderate left L3 neural foraminal stenosis.   L4-L5: Circumferential disc bulge slightly eccentric to the right. Mild facet and mild to moderate ligament flavum hypertrophy. No spinal or lateral recess stenosis. Mild right L4 neural foraminal stenosis.    L5-S1: Negative disc. Mild facet and ligament flavum hypertrophy. No stenosis.   IMPRESSION: 1. Mild for age overall lumbar spine degeneration. No acute osseous abnormality. 2. Disc bulging combined with posterior element hypertrophy. Mild multifactorial spinal stenosis at L2-L3. Up to moderate left L3 neural foraminal stenosis. And mild stenosis at the bilateral L4 nerve levels.     Electronically Signed   By: Genevie Ann M.D.   On: 12/22/2021 07:59     Objective:  VS:  HT:    WT:   BMI:     BP:121/78  HR:86bpm  TEMP: ( )  RESP:  Physical Exam Vitals and nursing note reviewed.  Constitutional:      General: She is not in acute distress.    Appearance: Normal appearance. She is not ill-appearing.  HENT:     Head: Normocephalic and atraumatic.     Right Ear: External ear normal.     Left Ear: External ear normal.  Eyes:     Extraocular Movements: Extraocular movements intact.  Cardiovascular:     Rate and Rhythm: Normal rate.     Pulses: Normal pulses.  Pulmonary:     Effort: Pulmonary effort is normal. No respiratory distress.  Abdominal:     General: There is no distension.     Palpations: Abdomen is soft.  Musculoskeletal:        General: Tenderness present.     Cervical back: Neck supple.  Right lower leg: No edema.     Left lower leg: No edema.     Comments: Patient has good distal strength with no pain over the greater trochanters.  No clonus or focal weakness.  Skin:    Findings: No erythema, lesion or rash.  Neurological:     General: No focal deficit present.     Mental Status: She is alert and oriented to person, place, and time.     Sensory: No sensory deficit.     Motor: No weakness or abnormal muscle tone.     Coordination: Coordination normal.  Psychiatric:        Mood and Affect: Mood normal.        Behavior: Behavior normal.      Imaging: No results found.

## 2022-02-07 NOTE — Procedures (Signed)
Lumbosacral Transforaminal Epidural Steroid Injection - Sub-Pedicular Approach with Fluoroscopic Guidance  Patient: Tracey Saunders      Date of Birth: 04-10-1956 MRN: [id number] PCP: Tawnya Crook, MD      Visit Date: 02/03/2022   Universal Protocol:    Date/Time: 02/03/2022  Consent Given By: the patient  Position: PRONE  Additional Comments: Vital signs were monitored before and after the procedure. Patient was prepped and draped in the usual sterile fashion. The correct patient, procedure, and site was verified.   Injection Procedure Details:   Procedure diagnoses: Lumbar radiculopathy [M54.16]    Meds Administered:  Meds ordered this encounter  Medications   methylPREDNISolone acetate (DEPO-MEDROL) injection 80 mg    Laterality: Bilateral  Location/Site: L4  Needle:5.0 in., 22 ga.  Short bevel or Quincke spinal needle  Needle Placement: Transforaminal  Findings:    -Comments: Excellent flow of contrast along the nerve, nerve root and into the epidural space.  Procedure Details: After squaring off the end-plates to get a true AP view, the C-arm was positioned so that an oblique view of the foramen as noted above was visualized. The target area is just inferior to the "nose of the scotty dog" or sub pedicular. The soft tissues overlying this structure were infiltrated with 2-3 ml. of 1% Lidocaine without Epinephrine.  The spinal needle was inserted toward the target using a "trajectory" view along the fluoroscope beam.  Under AP and lateral visualization, the needle was advanced so it did not puncture dura and was located close the 6 O'Clock position of the pedical in AP tracterory. Biplanar projections were used to confirm position. Aspiration was confirmed to be negative for CSF and/or blood. A 1-2 ml. volume of Isovue-250 was injected and flow of contrast was noted at each level. Radiographs were obtained for documentation purposes.   After attaining the desired  flow of contrast documented above, a 0.5 to 1.0 ml test dose of 0.25% Marcaine was injected into each respective transforaminal space.  The patient was observed for 90 seconds post injection.  After no sensory deficits were reported, and normal lower extremity motor function was noted,   the above injectate was administered so that equal amounts of the injectate were placed at each foramen (level) into the transforaminal epidural space.   Additional Comments:  The patient tolerated the procedure well Dressing: 2 x 2 sterile gauze and Band-Aid    Post-procedure details: Patient was observed during the procedure. Post-procedure instructions were reviewed.  Patient left the clinic in stable condition.

## 2022-02-09 ENCOUNTER — Other Ambulatory Visit: Payer: Self-pay | Admitting: Internal Medicine

## 2022-02-14 NOTE — Progress Notes (Unsigned)
Cardiology Office Note:    Date:  02/15/2022   ID:  Tracey Saunders, DOB [birth year], MRN [id number]  PCP:  Tawnya Crook, MD   [facility] Cardiologist:  Quay Burow, MD     Referring MD: Tawnya Crook, MD    CC: Fatigue X several months  History of Present Illness:    Tracey Saunders is a 66 y.o. female with a hx of the following:   HTN Chronic asthmatic bronchitis GERD Chronic right-sided low back pain with sciatica HLD Hx of cancer Anxiety Depression Caffeine abuse Drug induced headache  She was initially consulted by cardiology services and evaluated by Dr. Alvester Chou on June 03, 2019 because of hypertension.  It was found that during that visit she had recently relocated from [place], [place] to [place].  Her cardiac risk factors included a 80-pack-year tobacco use hx (having quit smoking in 2015), hyperlipidemia, and hypertension.  She denied any family history of heart disease, she had never had a heart attack or stroke.  Her PCP was treating her hypertension with newly added chlorthalidone in addition to lisinopril that helped improve her blood pressure readings.   She has not had any previous cardiac testing.  Today she presents for her overdue 1 year follow-up.  Chief complaint today is fatigue that has lasted for several months.  States she is not feeling good, head feels fuzzy, unable to think clearly at times, and cold intolerance has a history of rheumatoid arthritis and has chronic pain in her arms and in her neck. Denies any syncope, presyncope, or recent dizziness.  Does state in the past if she has had dizziness at home it is due to her bad legs and due to a mobility issue.  Denies any vertigo.  Her blood pressure is being controlled by Lasix 20 mg twice daily and amlodipine 10 mg daily.  Says she recently started amlodipine 10 mg daily, she has a history of ACE and ARB intolerance.  She is tolerating these medications well, however she  does not have any recent blood pressure readings from home as her blood pressure machine recently crashed.  Says she has had a chronic history of high blood pressure for quite a while, says she experiences headaches when blood pressure gets high. She was started on Lasix several months ago when she developed significant bilateral lower extremity swelling.  Lasix has improved this and she has not noticed any recent swelling in her lower extremities.  Denies any chest pain, shortness of breath, orthopnea, PND, significant weight changes, bleeding, or claudication.  States that she sometimes gets palpitations, however she has not had any recently.  She does admit to emotional stress as well as financial stress.  Denies any alcohol abuse or illicit drug use. Denies any suicidal ideation or homicidal ideation.  Denies any other questions or concerns today.  Past Medical History:  Diagnosis Date   Anxiety disorder 09/09/2019   Cancer [facility])    cervical cancer   Depression    Diverticulitis    Fatty liver    GERD (gastroesophageal reflux disease)    Hyperlipidemia    Hypertension    Insomnia 09/09/2019   Liver cyst    Vitamin D deficiency     Past Surgical History:  Procedure Laterality Date   ABDOMINAL HYSTERECTOMY     COLON RESECTION     due to diverticulitis    Current Medications: Current Meds  Medication Sig   AMLODIPINE BESYLATE PO Take 10 mg by mouth  daily.   atorvastatin (LIPITOR) 20 MG tablet Take 1 tablet (20 mg total) by mouth daily.   azelastine (ASTELIN) 0.1 % nasal spray Place 2 sprays into both nostrils 2 (two) times daily. Use in each nostril as directed   furosemide (LASIX) 20 MG tablet Take 1 tablet (20 mg total) by mouth 2 (two) times daily.   gabapentin (NEURONTIN) 300 MG capsule Take 2 capsules (600 mg total) by mouth 3 (three) times daily.   levocetirizine (XYZAL) 5 MG tablet Take 5 mg by mouth 2 (two) times daily as needed.   omeprazole (PRILOSEC) 40 MG capsule Take 1  tablet by mouth daily.   potassium chloride SA (KLOR-CON M) 20 MEQ tablet Take 1 tablet (20 mEq total) by mouth 2 (two) times daily.   sertraline (ZOLOFT) 50 MG tablet Take 1.5 tablets (75 mg total) by mouth at bedtime.   tizanidine (ZANAFLEX) 2 MG capsule Take 1 capsule (2 mg total) by mouth 3 (three) times daily.   traZODone (DESYREL) 50 MG tablet Take 2 tablets (100 mg total) by mouth at bedtime as needed for sleep.     Allergies:   Aleve-d sinus & cold [pseudoephedrine-naproxen na er], Lisinopril, and Losartan   Social History   Socioeconomic History   Marital status: Single    Spouse name: Not on file   Number of children: 1   Years of education: Not on file   Highest education level: Not on file  Occupational History   Occupation: retired  Tobacco Use   Smoking status: Light Smoker    Packs/day: 0.50    Types: Cigarettes   Smokeless tobacco: Never  Vaping Use   Vaping Use: Never used  Substance and Sexual Activity   Alcohol use: Yes    Comment: occasional beer or bloody mary once a week   Drug use: Never   Sexual activity: Not on file  Other Topics Concern   Not on file  Social History Narrative   Not on file   Social Determinants of Health   Financial Resource Strain: Not on file  Food Insecurity: Not on file  Transportation Needs: Not on file  Physical Activity: Not on file  Stress: Not on file  Social Connections: Not on file     Family History: The patient's family history is negative for Colon cancer, Esophageal cancer, Stomach cancer, and Rectal cancer.  ROS:   Review of Systems  Constitutional:  Positive for chills and malaise/fatigue. Negative for diaphoresis, fever and weight loss.  HENT: Negative.    Eyes: Negative.   Respiratory: Negative.    Cardiovascular:  Positive for palpitations and leg swelling. Negative for chest pain, orthopnea, claudication and PND.       Improved leg swelling and occasional palpitations, brief in duration - see HPI  note.   Gastrointestinal: Negative.   Genitourinary: Negative.   Musculoskeletal:  Positive for back pain, joint pain, myalgias and neck pain. Negative for falls.  Skin: Negative.   Neurological:  Positive for dizziness, weakness and headaches. Negative for tingling, tremors, sensory change, speech change, focal weakness, seizures and loss of consciousness.       See HPI.   Endo/Heme/Allergies: Negative.   Psychiatric/Behavioral:  Positive for depression and substance abuse. Negative for hallucinations, memory loss and suicidal ideas. The patient is nervous/anxious. The patient does not have insomnia.    Please see the history of present illness.    All other systems reviewed and are negative.  EKGs/Labs/Other Studies Reviewed:  The following studies were reviewed today:   EKG:  EKG is ordered today.  The ekg ordered today demonstrates normal sinus rhythm, 76 bpm, nonspecific ST segment change, otherwise no acute changes.   Recent Labs: 08/26/2021: TSH 3.04 12/31/2021: ALT 13; BUN 16; Creatinine, Ser 1.03; Hemoglobin 13.5; Platelets 339.0; Potassium 4.1; Sodium 139  Recent Lipid Panel    Component Value Date/Time   CHOL 165 05/05/2021 1345   CHOL 174 06/17/2019 1103   TRIG 80.0 05/05/2021 1345   HDL 67.40 05/05/2021 1345   HDL 61 06/17/2019 1103   CHOLHDL 2 05/05/2021 1345   VLDL 16.0 05/05/2021 1345   LDLCALC 82 05/05/2021 1345   LDLCALC 84 08/18/2020 1356     Risk Assessment/Calculations:    The 10-year ASCVD risk score (Arnett DK, et al., 2019) is: 14.9%   Values used to calculate the score:     Age: 52 years     Sex: Female     Is Non-Hispanic African American: No     Diabetic: No     Tobacco smoker: Yes     Systolic Blood Pressure: 409 mmHg     Is BP treated: Yes     HDL Cholesterol: 67.4 mg/dL     Total Cholesterol: 165 mg/dL      Physical Exam:    VS:  BP 134/72   Pulse 76   Ht '5\' 1"'$  (1.549 m)   Wt 146 lb (66.2 kg)   SpO2 95%   BMI 27.59 kg/m      Wt Readings from Last 3 Encounters:  02/15/22 146 lb (66.2 kg)  02/04/22 146 lb 8 oz (66.5 kg)  01/14/22 143 lb 2 oz (64.9 kg)     GEN: Well nourished, well developed in no acute distress HEENT: Normal NECK: No JVD; No carotid bruits CARDIAC: RRR, no murmurs, rubs, gallops; 2+ peripheral pulses throughout, strong and equal bilaterally RESPIRATORY:  Clear and diminished to auscultation without rales, wheezing or rhonchi  ABDOMEN: Soft, non-tender, non-distended, bowel sounds X 4 MUSCULOSKELETAL:  No edema; No deformity  SKIN: Warm and dry NEUROLOGIC:  Alert and oriented x 3  PSYCHIATRIC:  Nervous affect, appears very fatigued  ASSESSMENT:    1. Fatigue, unspecified type   2. Essential hypertension, benign   3. Hyperlipidemia, unspecified hyperlipidemia type   4. History of tobacco abuse    PLAN:    In order of problems listed above:  Fatigue - chronic, not progressing Etiology is unclear. Possible differential dx is heart failure, depression, hypothyroidism, blood pressure issue, electrolyte imbalance, or stress. Has not had a 2D echo in the past to evaluate overall heart function and valvular function and we will arrange this for her to rule out any cardiac causes for fatigue.  She is agreeable to have a Education officer, museum help her with her financial and transportation issues. I gave her social worker's information to help with financial issues and transportation issues. Recent blood work including CBC, BMET, and TSH was WNL.  We will continue to monitor. Continue to follow with PCP.  2.  Hypertension-chronic, stable  Blood pressure on exam today was 134/72.  Unable to determine blood pressure readings as she does not have a log with her today and stated her blood pressure machine crashed at home.  I am hesitant to adjust her blood pressure medication, as I do not have any recent readings.  We have arranged and have given her a blood pressure cuff in the office today. Discussed to  monitor  BP at home at least 2 hours after medications and sitting for 5-10 minutes and to log her readings.  I told her to monitor her blood pressure and if her systolic blood pressure reading is 100 or less and she develops symptoms of low blood pressure, to let us know. Continue current antihypertensive regimen.  Salty 6 diet information provided to patient.  3.  Hyperlipidemia with LDL goal less than 100-chronic, stable Last LDL was at goal at 82 in October 2022.  Continue Lipitor 20 mg daily.  During this visit, I discussed coronary calcium score testing, which she politely declined at this time.  Will revisit this at a future visit to determine if she has coronary calcifications.  For next visit in October we will revisit repeating fasting lipid panel.  4.  History of tobacco abuse-chronic, not progressing Patient has recently begun smoking again.  Perform motivational interviewing with her today and she is not interested in quitting smoking.  I discussed the potential health risks with her continued smoking.  I offered several smoking cessation resources to her, including 1-800-QUIT-NOW.  She declined all resources at this time.  5. Disposition: Follow up in 2 months or sooner if needed.       Medication Adjustments/Labs and Tests Ordered: Current medicines are reviewed at length with the patient today.  Concerns regarding medicines are outlined above.  Orders Placed This Encounter  Procedures   EKG 12-Lead   ECHOCARDIOGRAM COMPLETE   No orders of the defined types were placed in this encounter.   Patient Instructions  Medication Instructions:  Your physician recommends that you continue on your current medications as directed. Please refer to the Current Medication list given to you today.  *If you need a refill on your cardiac medications before your next appointment, please call your pharmacy*  Lab Work: NONE ordered at this time of appointment   If you have labs (blood work)  drawn today and your tests are completely normal, you will receive your results only by: [facility] (if you have MyChart) OR A paper copy in the mail If you have any lab test that is abnormal or we need to change your treatment, we will call you to review the results.  Testing/Procedures: Your physician has requested that you have an echocardiogram. Echocardiography is a painless test that uses sound waves to create images of your heart. It provides your doctor with information about the size and shape of your heart and how well your heart's chambers and valves are working. This procedure takes approximately one hour. There are no restrictions for this procedure.  Follow-Up: At [facility], you and your health needs are our priority.  As part of our continuing mission to provide you with exceptional heart care, we have created designated Provider Care Teams.  These Care Teams include your primary Cardiologist (physician) and Advanced Practice Providers (APPs -  Physician Assistants and Nurse Practitioners) who all work together to provide you with the care you need, when you need it.   Your next appointment:   2 month(s)  The format for your next appointment:   In Person  Provider:   APP        Other Instructions Weigh yourself EVERY morning after you go to the bathroom but before you eat or drink anything. Write this number down in a weight log/diary. If you gain 3 pounds overnight or 5 pounds in a week, call the office. Monitor blood pressure at home 2 hours after taking  morning medications. If Systolic (Top number) is consistently less than 100 please give our office a call.       Important Information About Sugar         Signed, Finis Bud, NP  02/15/2022 9:09 PM    [place]

## 2022-02-15 ENCOUNTER — Ambulatory Visit: Payer: Medicare HMO | Admitting: Nurse Practitioner

## 2022-02-15 ENCOUNTER — Encounter: Payer: Self-pay | Admitting: Physician Assistant

## 2022-02-15 VITALS — BP 134/72 | HR 76 | Ht 61.0 in | Wt 146.0 lb

## 2022-02-15 DIAGNOSIS — R5383 Other fatigue: Secondary | ICD-10-CM

## 2022-02-15 DIAGNOSIS — Z87891 Personal history of nicotine dependence: Secondary | ICD-10-CM

## 2022-02-15 DIAGNOSIS — I10 Essential (primary) hypertension: Secondary | ICD-10-CM | POA: Diagnosis not present

## 2022-02-15 DIAGNOSIS — E785 Hyperlipidemia, unspecified: Secondary | ICD-10-CM

## 2022-02-15 DIAGNOSIS — Z136 Encounter for screening for cardiovascular disorders: Secondary | ICD-10-CM

## 2022-02-15 NOTE — Patient Instructions (Addendum)
Medication Instructions:  Your physician recommends that you continue on your current medications as directed. Please refer to the Current Medication list given to you today.  *If you need a refill on your cardiac medications before your next appointment, please call your pharmacy*  Lab Work: NONE ordered at this time of appointment   If you have labs (blood work) drawn today and your tests are completely normal, you will receive your results only by: Donald (if you have MyChart) OR A paper copy in the mail If you have any lab test that is abnormal or we need to change your treatment, we will call you to review the results.  Testing/Procedures: Your physician has requested that you have an echocardiogram. Echocardiography is a painless test that uses sound waves to create images of your heart. It provides your doctor with information about the size and shape of your heart and how well your heart's chambers and valves are working. This procedure takes approximately one hour. There are no restrictions for this procedure.  Follow-Up: At [facility], you and your health needs are our priority.  As part of our continuing mission to provide you with exceptional heart care, we have created designated Provider Care Teams.  These Care Teams include your primary Cardiologist (physician) and Advanced Practice Providers (APPs -  Physician Assistants and Nurse Practitioners) who all work together to provide you with the care you need, when you need it.   Your next appointment:   2 month(s)  The format for your next appointment:   In Person  Provider:   APP        Other Instructions Weigh yourself EVERY morning after you go to the bathroom but before you eat or drink anything. Write this number down in a weight log/diary. If you gain 3 pounds overnight or 5 pounds in a week, call the office. Monitor blood pressure at home 2 hours after taking morning medications. If Systolic (Top number)  is consistently less than 100 please give our office a call.       Important Information About Sugar

## 2022-02-16 ENCOUNTER — Telehealth: Payer: Self-pay | Admitting: Licensed Clinical Social Worker

## 2022-02-16 ENCOUNTER — Encounter: Payer: Self-pay | Admitting: Family Medicine

## 2022-02-16 ENCOUNTER — Ambulatory Visit (INDEPENDENT_AMBULATORY_CARE_PROVIDER_SITE_OTHER): Payer: Medicare HMO | Admitting: Family Medicine

## 2022-02-16 VITALS — BP 134/84 | HR 73 | Temp 97.8°F | Ht 61.0 in | Wt 145.2 lb

## 2022-02-16 DIAGNOSIS — M79641 Pain in right hand: Secondary | ICD-10-CM | POA: Diagnosis not present

## 2022-02-16 DIAGNOSIS — I10 Essential (primary) hypertension: Secondary | ICD-10-CM

## 2022-02-16 DIAGNOSIS — G8929 Other chronic pain: Secondary | ICD-10-CM | POA: Diagnosis not present

## 2022-02-16 DIAGNOSIS — M79642 Pain in left hand: Secondary | ICD-10-CM | POA: Diagnosis not present

## 2022-02-16 DIAGNOSIS — M5441 Lumbago with sciatica, right side: Secondary | ICD-10-CM

## 2022-02-16 MED ORDER — DICLOFENAC SODIUM 1 % EX GEL: 4.0000 g | Freq: Four times a day (QID) | CUTANEOUS | 3 refills | Status: DC | PRN

## 2022-02-16 MED ORDER — HYDROCODONE-ACETAMINOPHEN 5-325 MG PO TABS: 1.0000 | ORAL_TABLET | Freq: Four times a day (QID) | ORAL | 0 refills | Status: DC | PRN

## 2022-02-16 NOTE — Progress Notes (Unsigned)
Heart and Vascular Care Navigation  02/16/2022  Tracey Saunders 1956/06/27 [id number]  Reason for Referral:  Patient is participating in a Managed Medicaid Plan: No, Managed Medicare only  Engaged with patient by telephone for initial visit for Heart and Vascular Care Coordination.                                                                                                   Assessment:   LCSW was able to reach pt this morning at [phone]. Introduced self, role, reason for call. Pt confirmed home address and PCP- she is currently at an appt at their office. She lives alone with her dog- she is close with her sister Tracey Saunders and shares it is the only family she has left. She utilizes [employer] to get to and from appointments.   Pt lives off of her Social Security income only. She makes too much with this income for [facility] assistance or to qualify for Medicaid but has difficulty keeping up with her bills- particularly her electric bill and rent. She has received no turn off notices and has no eviction proceedings- just general financial strain. We discussed our patient care fund as well as the [employer] application.  Pt worried about costs of procedures/testing when they are ordered- we discussed speaking with a [facility] counselor to ensure plan is meeting her needs. Pt agreeable to community resources being sent to her home address. She particularly is in need of a scale at this time. Pt appreciative of call and offers of support.                                   HRT/VAS Care Coordination     Patients Home Cardiology Office [place] Team Social Worker   Social Worker Name: Westley Hummer, LCSW, [facility]   Living arrangements for the past 2 months Apartment   Lives with: Self; Pets   Patient Current Insurance Coverage Managed Medicare   Patient Has Concern With Paying Medical Bills Yes   Patient Concerns With Medical  Bills general financial strain   Medical Bill Referrals: [facility]   Does Patient Have Prescription Coverage? Yes       Social History:                                                                             SDOH Screenings   Alcohol Screen: Low Risk  (09/16/2020)   Alcohol Screen    Last Alcohol Screening Score (AUDIT): 0  Depression (PHQ2-9): Low Risk  (02/16/2022)   Depression (PHQ2-9)    PHQ-2 Score: 0  Financial Resource Strain: High Risk (02/16/2022)   Overall Financial Resource Strain (CARDIA)  Difficulty of Paying Living Expenses: Hard  Food Insecurity: Food Insecurity Present (02/16/2022)   Hunger Vital Sign    Worried About Running Out of Food in the Last Year: Sometimes true    Ran Out of Food in the Last Year: Sometimes true  Housing: Medium Risk (02/16/2022)   Housing    Last Housing Risk Score: 1  Physical Activity: Not on file  Social Connections: Not on file  Stress: Not on file  Tobacco Use: High Risk (02/16/2022)   Patient History    Smoking Tobacco Use: Light Smoker    Smokeless Tobacco Use: Never    Passive Exposure: Not on file  Transportation Needs: No Transportation Needs (02/16/2022)   PRAPARE - Transportation    Lack of Transportation (Medical): No    Lack of Transportation (Non-Medical): No    SDOH Interventions: Financial Resources:  Financial Strain Interventions: Other (Comment) (Patient [place], [place], Radio broadcast assistant)   Food Insecurity:  Food Insecurity Interventions: Other (Comment) Architectural technologist)  Housing Insecurity:  Housing Interventions: Other (Comment) (Patient Care Fund)  Transportation:   Transportation Interventions: [employer] Pension scheme manager)    Follow-up plan:   LCSW has mailed the following: my card, food banks, [employer] counseling, additional transportation resources and patient care fund agreement. I will route this to [facility], team lead, to see if we can have a scale sent to her home. I will f/u with her again to  ensure paperwork received and answer any additional questions.

## 2022-02-16 NOTE — Patient Instructions (Signed)
It was very nice to see you today!  Amlodipine cut the '10mg'$  tablets in 1/2 to make '5mg'$ .   PLEASE NOTE:  If you had any lab tests please let us know if you have not heard back within a few days. You may see your results on MyChart before we have a chance to review them but we will give you a call once they are reviewed by [place]. If we ordered any referrals today, please let us know if you have not heard from their office within the next week.   Please try these tips to maintain a healthy lifestyle:  Eat most of your calories during the day when you are active. Eliminate processed foods including packaged sweets (pies, cakes, cookies), reduce intake of potatoes, white bread, white pasta, and white rice. Look for whole grain options, oat flour or almond flour.  Each meal should contain half fruits/vegetables, one quarter protein, and one quarter carbs (no bigger than a computer mouse).  Cut down on sweet beverages. This includes juice, soda, and sweet tea. Also watch fruit intake, though this is a healthier sweet option, it still contains natural sugar! Limit to 3 servings daily.  Drink at least 1 glass of water with each meal and aim for at least 8 glasses per day  Exercise at least 150 minutes every week.

## 2022-02-16 NOTE — Progress Notes (Signed)
Subjective:     Patient ID: Tracey Saunders, female    DOB: 09-28-1955, 66 y.o.   MRN: [id number]  Chief Complaint  Patient presents with   Leg Pain    Pt stated that she has some pain in her hands, leg and some back pain. Also pt state that her Bp has been going up    HPI  HTN-on lasix bid. Started back on amlodipine '10mg'$  as bp up in am and ha in am-new cuff and 151/89 this am. and head hurting.    Saw Card-wants to do Echo but pt has to wait till oct.  Everything hurts.  Ankles and legs swelling.  Card wants to do echo but pt will have to pay 100$ and can't do till Oct.  Has been told she has RA.  Hurts all day long.  Feet still not working right d/t pain.  Feels better w/gabapentin but still hurts.  Injections haven't helped. Tylenol not help, tramadol not help.  Told by card not to take nsaid's.  Needs something for relief.   Health Maintenance Due  Topic Date Due   INFLUENZA VACCINE  02/08/2022    Past Medical History:  Diagnosis Date   Anxiety disorder 09/09/2019   Cancer [facility])    cervical cancer   Depression    Diverticulitis    Fatty liver    GERD (gastroesophageal reflux disease)    Hyperlipidemia    Hypertension    Insomnia 09/09/2019   Liver cyst    Vitamin D deficiency     Past Surgical History:  Procedure Laterality Date   ABDOMINAL HYSTERECTOMY     COLON RESECTION     due to diverticulitis    Outpatient Medications Prior to Visit  Medication Sig Dispense Refill   AMLODIPINE BESYLATE PO Take 10 mg by mouth daily.     atorvastatin (LIPITOR) 20 MG tablet Take 1 tablet (20 mg total) by mouth daily. 90 tablet 1   azelastine (ASTELIN) 0.1 % nasal spray Place 2 sprays into both nostrils 2 (two) times daily. Use in each nostril as directed 30 mL 5   furosemide (LASIX) 20 MG tablet Take 1 tablet (20 mg total) by mouth 2 (two) times daily. 180 tablet 3   gabapentin (NEURONTIN) 300 MG capsule Take 2 capsules (600 mg total) by mouth 3 (three) times daily. 540 capsule 1    levocetirizine (XYZAL) 5 MG tablet Take 5 mg by mouth 2 (two) times daily as needed.     omeprazole (PRILOSEC) 40 MG capsule Take 1 tablet by mouth daily.     potassium chloride SA (KLOR-CON M) 20 MEQ tablet Take 1 tablet (20 mEq total) by mouth 2 (two) times daily. 180 tablet 1   sertraline (ZOLOFT) 50 MG tablet Take 1.5 tablets (75 mg total) by mouth at bedtime. 135 tablet 3   tizanidine (ZANAFLEX) 2 MG capsule Take 1 capsule (2 mg total) by mouth 3 (three) times daily. 30 capsule 0   traZODone (DESYREL) 50 MG tablet Take 2 tablets (100 mg total) by mouth at bedtime as needed for sleep. 180 tablet 1   No facility-administered medications prior to visit.    Allergies  Allergen Reactions   Aleve-D Sinus & Cold [Pseudoephedrine-Naproxen Na Er]    Lisinopril Swelling   Losartan Swelling   ROS neg/noncontributory except as noted HPI/below L hand very painful.  Tylenol not work.  Tramadol not work. Ha and neck pain a lot.       Objective:  BP 134/84   Pulse 73   Temp 97.8 F (36.6 C)   Ht '5\' 1"'$  (1.549 m)   Wt 145 lb 3.2 oz (65.9 kg)   SpO2 96%   BMI 27.44 kg/m  Wt Readings from Last 3 Encounters:  02/16/22 145 lb 3.2 oz (65.9 kg)  02/15/22 146 lb (66.2 kg)  02/04/22 146 lb 8 oz (66.5 kg)    Physical Exam   Gen: WDWN NAD wf HEENT: NCAT, conjunctiva not injected, sclera nonicteric NECK:  supple, no thyromegaly, no nodes, no carotid bruits CARDIAC: RRR, S1S2+, no murmur. DP 2+B LUNGS: CTAB. No wheezes EXT:  tr edema MSK: slow gait, limping using 4prong cane NEURO: A&O x3.  CN II-XII intact.  PSYCH: normal mood. 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  Limit nsaids d/t htn/edema.  Failed PT, tylenol, tramadol.  Cont gabapentin.  Will do once daily hydrocodone 5/325 to see if some relief.   Hand pain-prob more OA.  I don't see RA on problem list-need to research more. No recent RF labs.  Pt's ride here so can't wait for lab today.  Has f/u 1 mo so will check then.   Voltaren gel for L thumb pain HTN-chronic.  Controlled.  Cuff not working right but got new one from Card-was elevated this am-gets freq HA-not sure if headache causing blood pressure, elevated blood pressure causing headache, etc.  Continue Lasix 20 mg twice daily.  Okay to do 5 mg daily of amlodipine.  (She can cut the 10 mg in half).  My concern is edema will worsen as it seems to have went on in the past.  Monitor closely.  Meds ordered this encounter  Medications   diclofenac Sodium (VOLTAREN) 1 % GEL    Sig: Apply 4 g topically 4 (four) times daily as needed.    Dispense:  100 g    Refill:  3   HYDROcodone-acetaminophen (NORCO/VICODIN) 5-325 MG tablet    Sig: Take 1 tablet by mouth every 6 (six) hours as needed for moderate pain.    Dispense:  30 tablet    Refill:  0    Wellington Hampshire, MD

## 2022-02-17 ENCOUNTER — Telehealth: Payer: Self-pay | Admitting: Licensed Clinical Social Worker

## 2022-02-17 NOTE — Telephone Encounter (Signed)
CSW referred to assist patient with obtaining a scale. CSW contacted patient to inform scale will be delivered to home. Patient grateful for support and assistance. CSW available as needed. Jackie Lizza Huffaker, LCSW, CCSW-MCS [phone]  

## 2022-02-25 ENCOUNTER — Telehealth: Payer: Self-pay | Admitting: Licensed Clinical Social Worker

## 2022-02-25 NOTE — Telephone Encounter (Signed)
H&V Care Navigation CSW Progress Note  Clinical Social Worker contacted patient by phone to f/u on assistance applications as well as scale request. No answer at [phone]. Will re-attempt as able.   Patient is participating in a Managed Medicaid Plan:  No, [employer] Medicare only.   SDOH Screenings   Alcohol Screen: Low Risk  (09/16/2020)   Alcohol Screen    Last Alcohol Screening Score (AUDIT): 0  Depression (PHQ2-9): Low Risk  (02/16/2022)   Depression (PHQ2-9)    PHQ-2 Score: 0  Financial Resource Strain: High Risk (02/16/2022)   Overall Financial Resource Strain (CARDIA)    Difficulty of Paying Living Expenses: Hard  Food Insecurity: Food Insecurity Present (02/16/2022)   Hunger Vital Sign    Worried About Running Out of Food in the Last Year: Sometimes true    Ran Out of Food in the Last Year: Sometimes true  Housing: Medium Risk (02/16/2022)   Housing    Last Housing Risk Score: 1  Physical Activity: Not on file  Social Connections: Not on file  Stress: Not on file  Tobacco Use: High Risk (02/16/2022)   Patient History    Smoking Tobacco Use: Light Smoker    Smokeless Tobacco Use: Never    Passive Exposure: Not on file  Transportation Needs: No Transportation Needs (02/16/2022)   PRAPARE - Transportation    Lack of Transportation (Medical): No    Lack of Transportation (Non-Medical): No    Westley Hummer, MSW, [place]  [phone]- work cell phone (preferred) [phone]- desk phone

## 2022-02-28 ENCOUNTER — Telehealth: Payer: Self-pay | Admitting: Licensed Clinical Social Worker

## 2022-02-28 NOTE — Telephone Encounter (Signed)
H&V Care Navigation CSW Progress Note  Clinical Social Worker contacted patient by phone to f/u on assistance applications as well as scale request. No answer at [phone]- left second voicemail. Will re-attempt as able.   Patient is participating in a Managed Medicaid Plan:  No, [employer] Medicare  SDOH Screenings   Alcohol Screen: Low Risk  (09/16/2020)   Alcohol Screen    Last Alcohol Screening Score (AUDIT): 0  Depression (PHQ2-9): Low Risk  (02/16/2022)   Depression (PHQ2-9)    PHQ-2 Score: 0  Financial Resource Strain: High Risk (02/16/2022)   Overall Financial Resource Strain (CARDIA)    Difficulty of Paying Living Expenses: Hard  Food Insecurity: Food Insecurity Present (02/16/2022)   Hunger Vital Sign    Worried About Running Out of Food in the Last Year: Sometimes true    Ran Out of Food in the Last Year: Sometimes true  Housing: Medium Risk (02/16/2022)   Housing    Last Housing Risk Score: 1  Physical Activity: Not on file  Social Connections: Not on file  Stress: Not on file  Tobacco Use: High Risk (02/16/2022)   Patient History    Smoking Tobacco Use: Light Smoker    Smokeless Tobacco Use: Never    Passive Exposure: Not on file  Transportation Needs: No Transportation Needs (02/16/2022)   PRAPARE - Transportation    Lack of Transportation (Medical): No    Lack of Transportation (Non-Medical): No    Westley Hummer, MSW, [place]  [phone]- work cell phone (preferred) [phone]- desk phone

## 2022-03-02 ENCOUNTER — Telehealth: Payer: Self-pay | Admitting: Licensed Clinical Social Worker

## 2022-03-02 ENCOUNTER — Telehealth: Payer: Self-pay | Admitting: Cardiovascular Disease

## 2022-03-02 NOTE — Telephone Encounter (Signed)
H&V Care Navigation CSW Progress Note  Clinical Social Worker contacted patient by phone to f/u for a third time on resources. Aware pt has contacted clinic this morning. I was again unable to reach her- the phone goes directly to voicemail. I left another message with my direct dial. I remain available.   Patient is participating in a Managed Medicaid Plan:  No, [employer] Medicare only.   SDOH Screenings   Alcohol Screen: Low Risk  (09/16/2020)   Alcohol Screen    Last Alcohol Screening Score (AUDIT): 0  Depression (PHQ2-9): Low Risk  (02/16/2022)   Depression (PHQ2-9)    PHQ-2 Score: 0  Financial Resource Strain: High Risk (02/16/2022)   Overall Financial Resource Strain (CARDIA)    Difficulty of Paying Living Expenses: Hard  Food Insecurity: Food Insecurity Present (02/16/2022)   Hunger Vital Sign    Worried About Running Out of Food in the Last Year: Sometimes true    Ran Out of Food in the Last Year: Sometimes true  Housing: Medium Risk (02/16/2022)   Housing    Last Housing Risk Score: 1  Physical Activity: Not on file  Social Connections: Not on file  Stress: Not on file  Tobacco Use: High Risk (02/16/2022)   Patient History    Smoking Tobacco Use: Light Smoker    Smokeless Tobacco Use: Never    Passive Exposure: Not on file  Transportation Needs: No Transportation Needs (02/16/2022)   PRAPARE - Transportation    Lack of Transportation (Medical): No    Lack of Transportation (Non-Medical): No   Westley Hummer, MSW, [place]  [phone]- work cell phone (preferred) [phone]- desk phone

## 2022-03-02 NOTE — Telephone Encounter (Signed)
Patient called to follow-up on information provided by the Education officer, museum.

## 2022-03-03 ENCOUNTER — Telehealth: Payer: Self-pay | Admitting: Licensed Clinical Social Worker

## 2022-03-03 NOTE — Telephone Encounter (Signed)
H&V Care Navigation CSW Progress Note  Clinical Social Worker  was contacted by pt  to f/u on resources. She shares that her voicemail isnt working/she is having phone issues. Pt received her scale and is very pleased to have it. She did receive the other resources and we discussed how to utilize her insurance card to contact customer service about additional benefits such as rides etc. If she finds her current insurance is not meeting her needs then she can speak with [facility] counselor (resource in packet) to discuss enrollment changes or any additional Medicare programs she may be eligible for. No additional questions specifically at this time, she was encouraged to call me back with any questions/concerns.   Patient is participating in a Managed Medicaid Plan:  No,   SDOH Screenings   Alcohol Screen: Low Risk  (09/16/2020)   Alcohol Screen    Last Alcohol Screening Score (AUDIT): 0  Depression (PHQ2-9): Low Risk  (02/16/2022)   Depression (PHQ2-9)    PHQ-2 Score: 0  Financial Resource Strain: High Risk (02/16/2022)   Overall Financial Resource Strain (CARDIA)    Difficulty of Paying Living Expenses: Hard  Food Insecurity: Food Insecurity Present (02/16/2022)   Hunger Vital Sign    Worried About Running Out of Food in the Last Year: Sometimes true    Ran Out of Food in the Last Year: Sometimes true  Housing: Medium Risk (02/16/2022)   Housing    Last Housing Risk Score: 1  Physical Activity: Not on file  Social Connections: Not on file  Stress: Not on file  Tobacco Use: High Risk (02/16/2022)   Patient History    Smoking Tobacco Use: Light Smoker    Smokeless Tobacco Use: Never    Passive Exposure: Not on file  Transportation Needs: No Transportation Needs (02/16/2022)   PRAPARE - Transportation    Lack of Transportation (Medical): No    Lack of Transportation (Non-Medical): No   Westley Hummer, MSW, [place]  [phone]-  work cell phone (preferred) [phone]- desk phone

## 2022-03-14 DIAGNOSIS — R69 Illness, unspecified: Secondary | ICD-10-CM | POA: Diagnosis not present

## 2022-03-14 DIAGNOSIS — Z008 Encounter for other general examination: Secondary | ICD-10-CM | POA: Diagnosis not present

## 2022-03-14 DIAGNOSIS — I25119 Atherosclerotic heart disease of native coronary artery with unspecified angina pectoris: Secondary | ICD-10-CM | POA: Diagnosis not present

## 2022-03-14 DIAGNOSIS — K219 Gastro-esophageal reflux disease without esophagitis: Secondary | ICD-10-CM | POA: Diagnosis not present

## 2022-03-14 DIAGNOSIS — M4800 Spinal stenosis, site unspecified: Secondary | ICD-10-CM | POA: Diagnosis not present

## 2022-03-14 DIAGNOSIS — I10 Essential (primary) hypertension: Secondary | ICD-10-CM | POA: Diagnosis not present

## 2022-03-14 DIAGNOSIS — G4700 Insomnia, unspecified: Secondary | ICD-10-CM | POA: Diagnosis not present

## 2022-03-14 DIAGNOSIS — E785 Hyperlipidemia, unspecified: Secondary | ICD-10-CM | POA: Diagnosis not present

## 2022-03-14 DIAGNOSIS — K5900 Constipation, unspecified: Secondary | ICD-10-CM | POA: Diagnosis not present

## 2022-03-14 DIAGNOSIS — J301 Allergic rhinitis due to pollen: Secondary | ICD-10-CM | POA: Diagnosis not present

## 2022-03-14 DIAGNOSIS — I252 Old myocardial infarction: Secondary | ICD-10-CM | POA: Diagnosis not present

## 2022-03-17 ENCOUNTER — Telehealth: Payer: Self-pay | Admitting: Licensed Clinical Social Worker

## 2022-03-17 ENCOUNTER — Ambulatory Visit (INDEPENDENT_AMBULATORY_CARE_PROVIDER_SITE_OTHER): Payer: Medicare HMO | Admitting: Family Medicine

## 2022-03-17 ENCOUNTER — Encounter: Payer: Self-pay | Admitting: Family Medicine

## 2022-03-17 VITALS — BP 118/74 | HR 72 | Temp 97.9°F | Ht 61.0 in | Wt 144.8 lb

## 2022-03-17 DIAGNOSIS — R682 Dry mouth, unspecified: Secondary | ICD-10-CM | POA: Diagnosis not present

## 2022-03-17 DIAGNOSIS — F419 Anxiety disorder, unspecified: Secondary | ICD-10-CM

## 2022-03-17 DIAGNOSIS — F5101 Primary insomnia: Secondary | ICD-10-CM

## 2022-03-17 DIAGNOSIS — I10 Essential (primary) hypertension: Secondary | ICD-10-CM | POA: Diagnosis not present

## 2022-03-17 DIAGNOSIS — N390 Urinary tract infection, site not specified: Secondary | ICD-10-CM

## 2022-03-17 DIAGNOSIS — R69 Illness, unspecified: Secondary | ICD-10-CM | POA: Diagnosis not present

## 2022-03-17 LAB — POCT URINALYSIS DIPSTICK
Bilirubin, UA: NEGATIVE
Blood, UA: NEGATIVE
Glucose, UA: NEGATIVE
Ketones, UA: POSITIVE
Nitrite, UA: NEGATIVE
Protein, UA: NEGATIVE
Spec Grav, UA: 1.01 (ref 1.010–1.025)
Urobilinogen, UA: 0.2 E.U./dL
pH, UA: 6.5 (ref 5.0–8.0)

## 2022-03-17 MED ORDER — BIOTENE DRY MOUTH MOISTURIZING MT SOLN: OROMUCOSAL | 0 refills | Status: DC

## 2022-03-17 MED ORDER — NITROFURANTOIN MONOHYD MACRO 100 MG PO CAPS: 100.0000 mg | ORAL_CAPSULE | Freq: Two times a day (BID) | ORAL | 0 refills | Status: DC

## 2022-03-17 MED ORDER — HUMIDIFIERS MISC: 0 refills | Status: AC

## 2022-03-17 NOTE — Patient Instructions (Signed)
It was nice to see you!  You have a urinary tract infection. Please start the antibiotic.  We will check a urine culture to make sure you do not have a resistant bacteria. We will call you if we need to change your medications.   Please make sure you are drinking plenty of fluids over the next few days.  If your symptoms do not improve over the next 5-7 days, or if they worsen, please let us know. Please also let us know if you have worsening back pain, fevers, chills, or body aches.   Please try the biotine for your dry mouth.   Take care, Dr Jerline Pain

## 2022-03-17 NOTE — Telephone Encounter (Signed)
H&V Care Navigation CSW Progress Note  Clinical Social Worker  was contacted by pt  to request assistance with humidifier.  Pt had been recommended to obtain one during visit today. LCSW able to connect with Care Navigation team lead and we can send pt one utilizing our patient equipment fund. One has been purchased to be sent directly to pt home address. Unable to leave voicemail letting her know as voicemail full at [phone] and phone going straight there. I will re-attempt tomorrow.   Patient is participating in a Managed Medicaid Plan:  No, [employer] Medicare.   SDOH Screenings   Food Insecurity: Food Insecurity Present (02/16/2022)  Housing: Medium Risk (02/16/2022)  Transportation Needs: No Transportation Needs (02/16/2022)  Alcohol Screen: Low Risk  (09/16/2020)  Depression (PHQ2-9): Low Risk  (03/17/2022)  Financial Resource Strain: High Risk (02/16/2022)  Tobacco Use: High Risk (03/17/2022)    Westley Hummer, MSW, [place]  [phone]- work cell phone (preferred) [phone]- desk phone

## 2022-03-17 NOTE — Progress Notes (Signed)
   Tracey Saunders is a 66 y.o. female who presents today for an office visit.  Assessment/Plan:  New/Acute Problems: UTI No red flags.  No signs of systemic illness.  UA consistent with UTI.  Will empirically start [place] while we await culture results.  Encouraged hydration.  We discussed reasons to return to care.  Dry Mouth Likely multifactorial.  She is on several medications with anticholinergic effects which could be contributing.  Recommended she try using Biotene to see if this helps.  Also recommended use of humidifier.  Would not adjust any medications at this point as her other chronic conditions seem to be well controlled.  She will follow-up with her PCP if this continues to be an issue.  Chronic Problems Addressed Today: HTN At goal on amlodipine 10 mg daily  Anxiety / Insomnia / Allergies  Onset of medications which could be contributing to dry mouth including trazodone, gabapentin, Xyzal, and Zoloft.  Will not make any changes to treatment plan at this time given well-controlled the above symptoms though may consider modifying medications in the future if dry mouth continues to be an issue despite above.     Subjective:  HPI:  Patient here with concern for UTI. Her symptoms started several days ago. Symptoms include urgency and hesitancy.  No fevers or chills. She is having some back pain.  No nausea or vomiting.  Feels similar to previous UTIs.  She has also been having issues with dry mouth for the last several month.no specific treatments tried.  No recent medication changes.       Objective:  Physical Exam: BP 118/74   Pulse 72   Temp 97.9 F (36.6 C) (Temporal)   Ht '5\' 1"'$  (1.549 m)   Wt 144 lb 12.8 oz (65.7 kg)   SpO2 98%   BMI 27.36 kg/m   Gen: No acute distress, resting comfortably Neuro: Grossly normal, moves all extremities Psych: Normal affect and thought content      Christia Domke M. Jerline Pain, MD 03/17/2022 11:02 AM

## 2022-03-18 LAB — URINE CULTURE
MICRO NUMBER:: 13886306
SPECIMEN QUALITY:: ADEQUATE

## 2022-03-18 NOTE — Telephone Encounter (Signed)
H&V Care Navigation CSW Progress Note  Clinical Social Worker contacted patient by phone to try and reach her again regarding humidifier purchase. No answer again this morning at [phone], voicemail full so I sent a text message to try and reach her with update. Remain available.   Patient is participating in a Managed Medicaid Plan:  No. [employer] Medicare only.   SDOH Screenings   Food Insecurity: Food Insecurity Present (02/16/2022)  Housing: Medium Risk (02/16/2022)  Transportation Needs: No Transportation Needs (02/16/2022)  Alcohol Screen: Low Risk  (09/16/2020)  Depression (PHQ2-9): Low Risk  (03/17/2022)  Financial Resource Strain: High Risk (02/16/2022)  Tobacco Use: High Risk (03/17/2022)   Westley Hummer, MSW, [place]  [phone]- work cell phone (preferred) [phone]- desk phone      -

## 2022-03-21 NOTE — Progress Notes (Signed)
Please inform patient of the following:  Her urine culture is inconclusive. Would like for her to finish her antibiotics and let us know if not improving.  Algis Greenhouse. Jerline Pain, MD 03/21/2022 1:34 PM

## 2022-03-24 ENCOUNTER — Ambulatory Visit (INDEPENDENT_AMBULATORY_CARE_PROVIDER_SITE_OTHER): Payer: Medicare HMO

## 2022-03-24 DIAGNOSIS — Z0000 Encounter for general adult medical examination without abnormal findings: Secondary | ICD-10-CM | POA: Diagnosis not present

## 2022-03-24 NOTE — Patient Instructions (Signed)
Tracey Saunders , Thank you for taking time to come for your Medicare Wellness Visit. I appreciate your ongoing commitment to your health goals. Please review the following plan we discussed and let me know if I can assist you in the future.   Screening recommendations/referrals: Colonoscopy: done 11/22/19 repeat every  Mammogram: done 11/13/20 repeat every year  Bone Density: done 11/13/20 repeat every year  Recommended yearly ophthalmology/optometry visit for glaucoma screening and checkup Recommended yearly dental visit for hygiene and checkup  Vaccinations: Influenza vaccine: declined and discussed  Pneumococcal vaccine: declined and discussed  Tdap vaccine: declined and discussed  Shingles vaccine: declined and discussed    Covid-19:completed 5/19, 12/18/19  Advanced directives: Advance directive discussed with you today. Even though you declined this today please call our office should you change your mind and we can give you the proper paperwork for you to fill out.  Conditions/risks identified: increase walking around complex  Next appointment: Follow up in one year for your annual wellness visit    Preventive Care 65 Years and Older, Female Preventive care refers to lifestyle choices and visits with your health care provider that can promote health and wellness. What does preventive care include? A yearly physical exam. This is also called an annual well check. Dental exams once or twice a year. Routine eye exams. Ask your health care provider how often you should have your eyes checked. Personal lifestyle choices, including: Daily care of your teeth and gums. Regular physical activity. Eating a healthy diet. Avoiding tobacco and drug use. Limiting alcohol use. Practicing safe sex. Taking low-dose aspirin every day. Taking vitamin and mineral supplements as recommended by your health care provider. What happens during an annual well check? The services and screenings done by your  health care provider during your annual well check will depend on your age, overall health, lifestyle risk factors, and family history of disease. Counseling  Your health care provider may ask you questions about your: Alcohol use. Tobacco use. Drug use. Emotional well-being. Home and relationship well-being. Sexual activity. Eating habits. History of falls. Memory and ability to understand (cognition). Work and work Statistician. Reproductive health. Screening  You may have the following tests or measurements: Height, weight, and BMI. Blood pressure. Lipid and cholesterol levels. These may be checked every 5 years, or more frequently if you are over 65 years old. Skin check. Lung cancer screening. You may have this screening every year starting at age 67 if you have a 30-pack-year history of smoking and currently smoke or have quit within the past 15 years. Fecal occult blood test (FOBT) of the stool. You may have this test every year starting at age 39. Flexible sigmoidoscopy or colonoscopy. You may have a sigmoidoscopy every 5 years or a colonoscopy every 10 years starting at age 67. Hepatitis C blood test. Hepatitis B blood test. Sexually transmitted disease (STD) testing. Diabetes screening. This is done by checking your blood sugar (glucose) after you have not eaten for a while (fasting). You may have this done every 1-3 years. Bone density scan. This is done to screen for osteoporosis. You may have this done starting at age 23. Mammogram. This may be done every 1-2 years. Talk to your health care provider about how often you should have regular mammograms. Talk with your health care provider about your test results, treatment options, and if necessary, the need for more tests. Vaccines  Your health care provider may recommend certain vaccines, such as: Influenza vaccine. This is  recommended every year. Tetanus, diphtheria, and acellular pertussis (Tdap, Td) vaccine. You may  need a Td booster every 10 years. Zoster vaccine. You may need this after age 71. Pneumococcal 13-valent conjugate (PCV13) vaccine. One dose is recommended after age 5. Pneumococcal polysaccharide (PPSV23) vaccine. One dose is recommended after age 2. Talk to your health care provider about which screenings and vaccines you need and how often you need them. This information is not intended to replace advice given to you by your health care provider. Make sure you discuss any questions you have with your health care provider. Document Released: 07/24/2015 Document Revised: 03/16/2016 Document Reviewed: 04/28/2015 Elsevier Interactive Patient Education  2017 [employer] Prevention in the Home Falls can cause injuries. They can happen to people of all ages. There are many things you can do to make your home safe and to help prevent falls. What can I do on the outside of my home? Regularly fix the edges of walkways and driveways and fix any cracks. Remove anything that might make you trip as you walk through a door, such as a raised step or threshold. Trim any bushes or trees on the path to your home. Use bright outdoor lighting. Clear any walking paths of anything that might make someone trip, such as rocks or tools. Regularly check to see if handrails are loose or broken. Make sure that both sides of any steps have handrails. Any raised decks and porches should have guardrails on the edges. Have any leaves, snow, or ice cleared regularly. Use sand or salt on walking paths during winter. Clean up any spills in your garage right away. This includes oil or grease spills. What can I do in the bathroom? Use night lights. Install grab bars by the toilet and in the tub and shower. Do not use towel bars as grab bars. Use non-skid mats or decals in the tub or shower. If you need to sit down in the shower, use a plastic, non-slip stool. Keep the floor dry. Clean up any water that spills on  the floor as soon as it happens. Remove soap buildup in the tub or shower regularly. Attach bath mats securely with double-sided non-slip rug tape. Do not have throw rugs and other things on the floor that can make you trip. What can I do in the bedroom? Use night lights. Make sure that you have a light by your bed that is easy to reach. Do not use any sheets or blankets that are too big for your bed. They should not hang down onto the floor. Have a firm chair that has side arms. You can use this for support while you get dressed. Do not have throw rugs and other things on the floor that can make you trip. What can I do in the kitchen? Clean up any spills right away. Avoid walking on wet floors. Keep items that you use a lot in easy-to-reach places. If you need to reach something above you, use a strong step stool that has a grab bar. Keep electrical cords out of the way. Do not use floor polish or wax that makes floors slippery. If you must use wax, use non-skid floor wax. Do not have throw rugs and other things on the floor that can make you trip. What can I do with my stairs? Do not leave any items on the stairs. Make sure that there are handrails on both sides of the stairs and use them. Fix handrails that are  broken or loose. Make sure that handrails are as long as the stairways. Check any carpeting to make sure that it is firmly attached to the stairs. Fix any carpet that is loose or worn. Avoid having throw rugs at the top or bottom of the stairs. If you do have throw rugs, attach them to the floor with carpet tape. Make sure that you have a light switch at the top of the stairs and the bottom of the stairs. If you do not have them, ask someone to add them for you. What else can I do to help prevent falls? Wear shoes that: Do not have high heels. Have rubber bottoms. Are comfortable and fit you well. Are closed at the toe. Do not wear sandals. If you use a stepladder: Make sure  that it is fully opened. Do not climb a closed stepladder. Make sure that both sides of the stepladder are locked into place. Ask someone to hold it for you, if possible. Clearly mark and make sure that you can see: Any grab bars or handrails. First and last steps. Where the edge of each step is. Use tools that help you move around (mobility aids) if they are needed. These include: Canes. Walkers. Scooters. Crutches. Turn on the lights when you go into a dark area. Replace any light bulbs as soon as they burn out. Set up your furniture so you have a clear path. Avoid moving your furniture around. If any of your floors are uneven, fix them. If there are any pets around you, be aware of where they are. Review your medicines with your doctor. Some medicines can make you feel dizzy. This can increase your chance of falling. Ask your doctor what other things that you can do to help prevent falls. This information is not intended to replace advice given to you by your health care provider. Make sure you discuss any questions you have with your health care provider. Document Released: 04/23/2009 Document Revised: 12/03/2015 Document Reviewed: 08/01/2014 Elsevier Interactive Patient Education  2017 [employer].

## 2022-03-24 NOTE — Progress Notes (Addendum)
Virtual Visit via Telephone Note  I connected with  Tracey Saunders on 03/24/22 at 11:45 AM EDT by telephone and verified that I am speaking with the correct person using two identifiers.  Medicare Annual Wellness visit completed telephonically due to Covid-19 pandemic.   Persons participating in this call: This Health Coach and this patient.   Location: Patient: home Provider: office    I discussed the limitations, risks, security and privacy concerns of performing an evaluation and management service by telephone and the availability of in person appointments. The patient expressed understanding and agreed to proceed.  Unable to perform video visit due to video visit attempted and failed and/or patient does not have video capability.   Some vital signs may be absent or patient reported.   Willette Brace, LPN   Subjective:   Tracey Saunders is a 66 y.o. female who presents for an Initial Medicare Annual Wellness Visit.  Review of Systems     Cardiac Risk Factors include: advanced age (>71mn, >>63women);dyslipidemia;hypertension;smoking/ tobacco exposure     Objective:    There were no vitals filed for this visit. There is no height or weight on file to calculate BMI.     03/24/2022   11:41 AM  Advanced Directives  Does Patient Have a Medical Advance Directive? No  Would patient like information on creating a medical advance directive? No - Patient declined    Current Medications (verified) Outpatient Encounter Medications as of 03/24/2022  Medication Sig   AMLODIPINE BESYLATE PO Take 10 mg by mouth daily.   Artificial Saliva (BIOTENE DRY MOUTH MOISTURIZING) SOLN USe daily as needed for dry mouth   atorvastatin (LIPITOR) 20 MG tablet Take 1 tablet (20 mg total) by mouth daily.   azelastine (ASTELIN) 0.1 % nasal spray Place 2 sprays into both nostrils 2 (two) times daily. Use in each nostril as directed   diclofenac Sodium (VOLTAREN) 1 % GEL Apply 4 g topically 4 (four)  times daily as needed.   furosemide (LASIX) 20 MG tablet Take 1 tablet (20 mg total) by mouth 2 (two) times daily.   gabapentin (NEURONTIN) 300 MG capsule Take 2 capsules (600 mg total) by mouth 3 (three) times daily.   Humidifiers MISC Use as needed to prevent dry mouth   levocetirizine (XYZAL) 5 MG tablet Take 5 mg by mouth 2 (two) times daily as needed.   omeprazole (PRILOSEC) 40 MG capsule Take 1 tablet by mouth daily.   potassium chloride SA (KLOR-CON M) 20 MEQ tablet Take 1 tablet (20 mEq total) by mouth 2 (two) times daily.   sertraline (ZOLOFT) 50 MG tablet Take 1.5 tablets (75 mg total) by mouth at bedtime.   tizanidine (ZANAFLEX) 2 MG capsule Take 1 capsule (2 mg total) by mouth 3 (three) times daily.   traZODone (DESYREL) 50 MG tablet Take 2 tablets (100 mg total) by mouth at bedtime as needed for sleep.   HYDROcodone-acetaminophen (NORCO/VICODIN) 5-325 MG tablet Take 1 tablet by mouth every 6 (six) hours as needed for moderate pain. (Patient not taking: Reported on 03/24/2022)   nitrofurantoin, macrocrystal-monohydrate, (MACROBID) 100 MG capsule Take 1 capsule (100 mg total) by mouth 2 (two) times daily. (Patient not taking: Reported on 03/24/2022)   No facility-administered encounter medications on file as of 03/24/2022.    Allergies (verified) Aleve-d sinus & cold [pseudoephedrine-naproxen na er], Lisinopril, and Losartan   History: Past Medical History:  Diagnosis Date   Anxiety disorder 09/09/2019   [place]   Depression    Diverticulitis    Fatty liver    GERD (gastroesophageal reflux disease)    Hyperlipidemia    Hypertension    Insomnia 09/09/2019   Liver cyst    Vitamin D deficiency    Past Surgical History:  Procedure Laterality Date   ABDOMINAL HYSTERECTOMY     COLON RESECTION     due to diverticulitis   Family History  Problem Relation Age of Onset   Colon cancer Neg Hx    Esophageal cancer Neg Hx    Stomach cancer Neg Hx    Rectal  cancer Neg Hx    Social History   Socioeconomic History   Marital status: Single    Spouse name: Not on file   Number of children: 1   Years of education: Not on file   Highest education level: Not on file  Occupational History   Occupation: retired  Tobacco Use   Smoking status: Light Smoker    Packs/day: 0.50    Types: Cigarettes   Smokeless tobacco: Never  Vaping Use   Vaping Use: Never used  Substance and Sexual Activity   Alcohol use: Yes    Comment: occasional beer or bloody mary once a week   Drug use: Never   Sexual activity: Not on file  Other Topics Concern   Not on file  Social History Narrative   Not on file   Social Determinants of Health   Financial Resource Strain: Low Risk  (03/24/2022)   Overall Financial Resource Strain (CARDIA)    Difficulty of Paying Living Expenses: Not very hard  Recent Concern: Emergency planning/management officer Strain - High Risk (02/16/2022)   Overall Financial Resource Strain (CARDIA)    Difficulty of Paying Living Expenses: Hard  Food Insecurity: No Food Insecurity (03/24/2022)   Hunger Vital Sign    Worried About Running Out of Food in the Last Year: Never true    Ran Out of Food in the Last Year: Never true  Recent Concern: Food Insecurity - Food Insecurity Present (02/16/2022)   Hunger Vital Sign    Worried About Running Out of Food in the Last Year: Sometimes true    Ran Out of Food in the Last Year: Sometimes true  Transportation Needs: No Transportation Needs (03/24/2022)   PRAPARE - Hydrologist (Medical): No    Lack of Transportation (Non-Medical): No  Physical Activity: Sufficiently Active (03/24/2022)   Exercise Vital Sign    Days of Exercise per Week: 5 days    Minutes of Exercise per Session: 30 min  Stress: Stress Concern Present (03/24/2022)   [place]    Feeling of Stress : To some extent  Social Connections: Socially Isolated  (03/24/2022)   Social Connection and Isolation Panel [NHANES]    Frequency of Communication with Friends and Family: More than three times a week    Frequency of Social Gatherings with Friends and Family: Three times a week    Attends Religious Services: Never    Active Member of Clubs or Organizations: No    Attends Archivist Meetings: Never    Marital Status: Never married    Tobacco Counseling Ready to quit: Not Answered Counseling given: Not Answered   Clinical Intake:  Pre-visit preparation completed: Yes  Pain : No/denies pain     BMI - recorded: 27.37 Nutritional Status: BMI 25 -29 Overweight Nutritional Risks: None Diabetes: No  How often do you need to have someone help you when you read instructions, pamphlets, or other written materials from your doctor or pharmacy?: 1 - Never  Diabetic?no  Interpreter Needed?: No  Information entered by :: Charlott Rakes, LPN   Activities of Daily Living    03/24/2022   11:42 AM  In your present state of health, do you have any difficulty performing the following activities:  Hearing? 0  Vision? 0  Difficulty concentrating or making decisions? 0  Walking or climbing stairs? 1  Dressing or bathing? 0  Doing errands, shopping? 0  Preparing Food and eating ? N  Using the Toilet? N  In the past six months, have you accidently leaked urine? N  Do you have problems with loss of bowel control? N  Managing your Medications? N  Managing your Finances? N  Housekeeping or managing your Housekeeping? N    Patient Care Team: Tawnya Crook, MD as PCP - General (Family Medicine) Lorretta Harp, MD as PCP - Cardiology (Cardiology)  Indicate any recent Medical Services you may have received from other than [facility] providers in the past year (date may be approximate).     Assessment:   This is a routine wellness examination for Tracey Saunders.  Hearing/Vision screen Hearing Screening - Comments:: Pt denies any hearing  issues  Vision Screening - Comments:: Pt follows upwith [facility] for annual eye exams   Dietary issues and exercise activities discussed: Current Exercise Habits: Home exercise routine, Type of exercise: walking, Time (Minutes): 30, Frequency (Times/Week): 5, Weekly Exercise (Minutes/Week): 150   Goals Addressed             This Visit's Progress    Patient Stated       Be able to increase walking        Depression Screen    03/24/2022   11:37 AM 03/17/2022   10:41 AM 02/16/2022   10:09 AM 02/04/2022    9:23 AM 10/12/2021   11:37 AM 10/12/2021   11:36 AM 09/16/2020   11:05 AM  PHQ 2/9 Scores  PHQ - 2 Score 0 0 0 0 0 0 0  PHQ- 9 Score 0 0 0 0 6      Fall Risk    03/24/2022   11:41 AM 03/17/2022   10:41 AM 02/16/2022   10:16 AM 02/04/2022    7:55 AM 12/23/2021    9:24 AM  Fall Risk   Falls in the past year?  0 0 0 0  Number falls in past yr:  0 0 0 0  Injury with Fall?  0 0 0 0  Risk for fall due to : Impaired vision;Impaired balance/gait;Impaired mobility;History of fall(s) No Fall Risks No Fall Risks Impaired balance/gait No Fall Risks  Follow up Falls prevention discussed  Falls evaluation completed  Falls evaluation completed    [place]:  Any stairs in or around the home? Yes  If so, are there any without handrails? No  Home free of loose throw rugs in walkways, pet beds, electrical cords, etc? Yes  Adequate lighting in your home to reduce risk of falls? Yes   ASSISTIVE DEVICES UTILIZED TO PREVENT FALLS:  Life alert? No  Use of a cane, walker or w/c? Yes  Grab bars in the bathroom? No  Shower chair or bench in shower? Yes  Elevated toilet seat or a handicapped toilet? No   TIMED UP AND GO:  Was the test performed? No .  Cognitive Function:        03/24/2022   11:44 AM  6CIT Screen  What Year? 0 points  What month? 0 points  What time? 0 points  Count back from 20 0 points  Months in reverse 4 points  Repeat phrase  2 points  Total Score 6 points    Immunizations Immunization History  Administered Date(s) Administered   PFIZER(Purple Top)SARS-COV-2 Vaccination 11/27/2019, 12/18/2019    TDAP status: Due, Education has been provided regarding the importance of this vaccine. Advised may receive this vaccine at local pharmacy or Health Dept. Aware to provide a copy of the vaccination record if obtained from local pharmacy or Health Dept. Verbalized acceptance and understanding.  Flu Vaccine status: Declined, Education has been provided regarding the importance of this vaccine but patient still declined. Advised may receive this vaccine at local pharmacy or Health Dept. Aware to provide a copy of the vaccination record if obtained from local pharmacy or Health Dept. Verbalized acceptance and understanding.  Pneumococcal vaccine status: Declined,  Education has been provided regarding the importance of this vaccine but patient still declined. Advised may receive this vaccine at local pharmacy or Health Dept. Aware to provide a copy of the vaccination record if obtained from local pharmacy or Health Dept. Verbalized acceptance and understanding.   Covid-19 vaccine status: Completed vaccines  Qualifies for Shingles Vaccine? Yes   Zostavax completed No   Shingrix Completed?: No.    Education has been provided regarding the importance of this vaccine. Patient has been advised to call insurance company to determine out of pocket expense if they have not yet received this vaccine. Advised may also receive vaccine at local pharmacy or Health Dept. Verbalized acceptance and understanding.  Screening Tests Health Maintenance  Topic Date Due   COVID-19 Vaccine (3 - Pfizer risk series) 04/02/2022 (Originally 01/15/2020)   Pneumonia Vaccine 75+ Years old (1 - PCV) 05/21/2022 (Originally 05/25/1962)   TETANUS/TDAP  05/21/2022 (Originally 05/25/1973)   Zoster Vaccines- Shingrix (1 of 2) 05/31/2022 (Originally 05/25/1973)    INFLUENZA VACCINE  10/10/2022 (Originally 02/08/2022)   MAMMOGRAM  02/17/2023 (Originally 05/25/2004)   DEXA SCAN  02/17/2023 (Originally 05/26/2019)   Hepatitis C Screening  02/17/2023 (Originally [birth year]/25/1972)   COLONOSCOPY (Pts 45-55yr Insurance coverage will need to be confirmed)  11/21/2029   HPV VACCINES  Aged Out    Health Maintenance  There are no preventive care reminders to display for this patient.  Colorectal cancer screening: Type of screening: Colonoscopy. Completed 11/22/19. Repeat every 10 years  Mammogram status: Completed 11/13/20. Repeat every year  Bone density   Lung Cancer Screening: (Low Dose CT Chest recommended if Age 958-80years, 30 pack-year currently smoking OR have quit w/in 15years.) does qualify.   Lung Cancer Screening Referral: declined at this time   Additional Screening:  Hepatitis C Screening: does qualify;  Vision Screening: Recommended annual ophthalmology exams for early detection of glaucoma and other disorders of the eye. Is the patient up to date with their annual eye exam?  Yes  Who is the provider or what is the name of the office in which the patient attends annual eye exams? [facility]  If pt is not established with a provider, would they like to be referred to a provider to establish care? No .   Dental Screening: Recommended annual dental exams for proper oral hygiene  Community Resource Referral / Chronic Care Management: CRR required this visit?  No   CCM required this visit?  No      Plan:     I have personally reviewed and noted the following in the patient's chart:   Medical and social history Use of alcohol, tobacco or illicit drugs  Current medications and supplements including opioid prescriptions. Patient is not currently taking opioid prescriptions. Functional ability and status Nutritional status Physical activity Advanced directives List of other physicians Hospitalizations, surgeries, and ER visits in  previous 12 months Vitals Screenings to include cognitive, depression, and falls Referrals and appointments  In addition, I have reviewed and discussed with patient certain preventive protocols, quality metrics, and best practice recommendations. A written personalized care plan for preventive services as well as general preventive health recommendations were provided to patient.     Willette Brace, LPN   9/94/1290   Nurse Notes: none

## 2022-03-28 ENCOUNTER — Telehealth: Payer: Self-pay | Admitting: Licensed Clinical Social Worker

## 2022-03-28 NOTE — Telephone Encounter (Signed)
H&V Care Navigation CSW Progress Note  Clinical Social Worker  was contacted by pt  who confirmed that her humidifier was delivered to her home. Expressed appreciation. Asked if I could speak with her sister about connecting with care management. I have left her sister [name] a message at [phone], will explain how she can self refer to assistance if she sees heart/vascular physician or connect with community resources if not in our network.   Patient is participating in a Managed Medicaid Plan:  No, [employer] Medicare only.   SDOH Screenings   Food Insecurity: No Food Insecurity (03/24/2022)  Recent Concern: Food Insecurity - Food Insecurity Present (02/16/2022)  Housing: Low Risk  (03/24/2022)  Recent Concern: Housing - Medium Risk (02/16/2022)  Transportation Needs: No Transportation Needs (03/24/2022)  Alcohol Screen: Low Risk  (09/16/2020)  Depression (PHQ2-9): Low Risk  (03/24/2022)  Financial Resource Strain: Low Risk  (03/24/2022)  Recent Concern: Financial Resource Strain - High Risk (02/16/2022)  Physical Activity: Sufficiently Active (03/24/2022)  Social Connections: Socially Isolated (03/24/2022)  Stress: Stress Concern Present (03/24/2022)  Tobacco Use: High Risk (03/24/2022)   Westley Hummer, MSW, LCSW Clinical Social Worker II [place]  [phone]- work cell phone (preferred) [phone]- desk phone

## 2022-04-14 ENCOUNTER — Telehealth: Payer: Self-pay | Admitting: Family Medicine

## 2022-04-14 ENCOUNTER — Other Ambulatory Visit: Payer: Self-pay | Admitting: *Deleted

## 2022-04-14 MED ORDER — HOT/COLD PACK PADS: MEDICATED_PAD | 0 refills | Status: DC

## 2022-04-14 NOTE — Telephone Encounter (Signed)
Rx sent to the pharmacy.

## 2022-04-14 NOTE — Telephone Encounter (Signed)
Patient requests a new RX for a Medical Hot and Cold Pack be sent to:  [place] ([place]), [place] - [address] Phone: [phone]  Fax: [phone]

## 2022-04-15 ENCOUNTER — Ambulatory Visit (INDEPENDENT_AMBULATORY_CARE_PROVIDER_SITE_OTHER): Payer: Medicare HMO | Admitting: Family Medicine

## 2022-04-15 VITALS — BP 132/81 | HR 65 | Temp 98.0°F | Wt 146.0 lb

## 2022-04-15 DIAGNOSIS — F3342 Major depressive disorder, recurrent, in full remission: Secondary | ICD-10-CM

## 2022-04-15 DIAGNOSIS — R69 Illness, unspecified: Secondary | ICD-10-CM | POA: Diagnosis not present

## 2022-04-15 DIAGNOSIS — I10 Essential (primary) hypertension: Secondary | ICD-10-CM | POA: Diagnosis not present

## 2022-04-15 DIAGNOSIS — M2550 Pain in unspecified joint: Secondary | ICD-10-CM

## 2022-04-15 LAB — CBC
HCT: 38.1 % (ref 36.0–46.0)
Hemoglobin: 13 g/dL (ref 12.0–15.0)
MCHC: 34 g/dL (ref 30.0–36.0)
MCV: 93.9 fl (ref 78.0–100.0)
Platelets: 266 10*3/uL (ref 150.0–400.0)
RBC: 4.06 Mil/uL (ref 3.87–5.11)
RDW: 13.8 % (ref 11.5–15.5)
WBC: 8 10*3/uL (ref 4.0–10.5)

## 2022-04-15 LAB — COMPREHENSIVE METABOLIC PANEL
ALT: 11 U/L (ref 0–35)
AST: 15 U/L (ref 0–37)
Albumin: 4.5 g/dL (ref 3.5–5.2)
Alkaline Phosphatase: 73 U/L (ref 39–117)
BUN: 13 mg/dL (ref 6–23)
CO2: 31 mEq/L (ref 19–32)
Calcium: 9.3 mg/dL (ref 8.4–10.5)
Chloride: 100 mEq/L (ref 96–112)
Creatinine, Ser: 0.89 mg/dL (ref 0.40–1.20)
GFR: 66.87 mL/min (ref >60.00)
Glucose, Bld: 95 mg/dL (ref 70–99)
Potassium: 4 mEq/L (ref 3.5–5.1)
Sodium: 140 mEq/L (ref 135–145)
Total Bilirubin: 0.3 mg/dL (ref 0.2–1.2)
Total Protein: 7.2 g/dL (ref 6.0–8.3)

## 2022-04-15 LAB — CK: Total CK: 85 U/L (ref 7–177)

## 2022-04-15 LAB — C-REACTIVE PROTEIN: CRP: <1 mg/dL (ref 0.5–20.0)

## 2022-04-15 LAB — SEDIMENTATION RATE: Sed Rate: 17 mm/hr (ref 0–30)

## 2022-04-15 LAB — URIC ACID: Uric Acid, Serum: 5.3 mg/dL (ref 2.4–7.0)

## 2022-04-15 LAB — VITAMIN B12: Vitamin B-12: 410 pg/mL (ref 211–911)

## 2022-04-15 MED ORDER — HOT/COLD PACK PADS
MEDICATED_PAD | 0 refills | Status: DC
Start: 2022-04-15 — End: 2023-09-25

## 2022-04-15 MED ORDER — PREDNISONE 50 MG PO TABS: ORAL_TABLET | ORAL | 0 refills | Status: DC

## 2022-04-15 MED ORDER — BUPROPION HCL ER (XL) 150 MG PO TB24: 150.0000 mg | ORAL_TABLET | Freq: Every day | ORAL | 0 refills | Status: DC

## 2022-04-15 NOTE — Progress Notes (Signed)
   Tracey Saunders is a 66 y.o. female who presents today for an office visit.  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  She has had x-rays of her back which showed some degenerative changes but no x-rays on her ankles.  We discussed obtaining these today however she deferred due to financial concerns.  He is possible this could represent underlying rheumatologic issue though she did have some lab work done about a year ago which showed negative uric acid, rheumatoid factor, and normal sedimentation rate.  We will start prednisone burst.  We will check labs today including sed rate, CRP, ANA, uric acid, CK, and B12.  Chronic Problems Addressed Today: Depression Not controlled.  No SI or HI.  We discussed treatment options.  She would like to restart Wellbutrin.  Think this is reasonable.  We will send in 150 mg daily.  We will continue Zoloft 25 mg daily.  Advised her to follow-up with her PCP see  Essential hypertension Blood pressure at goal today on amlodipine 10 mg daily.  This potentially could be contributing some to her lower extremity swelling.  May consider changing blood pressure regimen if she continues to have lower extremity edema though will defer this to her PCP.    Subjective:  HPI:  See A/p for status of chronic conditions. Her main concern today is worsening depression.  This seems to have been worsening the last several weeks. She does have a longstanding history of depression. She is currently on zoloft '75mg'$  daily and tolerating well. She has been on wellbutrin in the past and would like to restart this. She does have any SI or HI.  She has also been having a lot of ankle pain for the last couple of days. This has been happening intermittently for the last few years. She has some associated swelling as well. The pain has made it difficult for her to walk   around.  She is intermittently had issues with pain and most of her joints and back for the past several years.  She reports that she has been evaluated by multiple doctors over that time without ever any clear etiology being found.       Objective:  Physical Exam: BP 132/81   Pulse 65   Temp 98 F (36.7 C) (Temporal)   Wt 146 lb (66.2 kg)   SpO2 98%   BMI 27.59 kg/m   Wt Readings from Last 3 Encounters:  04/15/22 146 lb (66.2 kg)  03/17/22 144 lb 12.8 oz (65.7 kg)  02/16/22 145 lb 3.2 oz (65.9 kg)    Gen: No acute distress, resting comfortably MSK: Bilateral ankles without deformities.  Tenderness palpation along joint line.  Neurovascular intact distally.  Knees with full range of motion.  Tenderness palpation along posterior calves bilaterally. Neuro: Grossly normal, moves all extremities Psych: Normal affect and thought content      Tracey Saunders M. Jerline Pain, MD 04/15/2022 11:35 AM

## 2022-04-15 NOTE — Patient Instructions (Signed)
It was very nice to see you today!  Please start the Wellbutrin.  We will check blood work today.  Please start the prednisone.  I will send a prescription in for the hot cold packs to your pharmacy as well.  Please follow-up with your primary care doctor within the next couple of weeks.  Take care, Dr Jerline Pain  PLEASE NOTE:  If you had any lab tests please let us know if you have not heard back within a few days. You may see your results on mychart before we have a chance to review them but we will give you a call once they are reviewed by [place]. If we ordered any referrals today, please let us know if you have not heard from their office within the next week.   Please try these tips to maintain a healthy lifestyle:  Eat at least 3 REAL meals and 1-2 snacks per day.  Aim for no more than 5 hours between eating.  If you eat breakfast, please do so within one hour of getting up.   Each meal should contain half fruits/vegetables, one quarter protein, and one quarter carbs (no bigger than a computer mouse)  Cut down on sweet beverages. This includes juice, soda, and sweet tea.   Drink at least 1 glass of water with each meal and aim for at least 8 glasses per day  Exercise at least 150 minutes every week.

## 2022-04-18 ENCOUNTER — Telehealth: Payer: Self-pay | Admitting: Cardiovascular Disease

## 2022-04-18 NOTE — Telephone Encounter (Signed)
Pt wants to know if her echo will be covered by her insurance

## 2022-04-19 NOTE — Progress Notes (Signed)
Please inform patient of the following:  Labs are all NORMAL.  Would like for her to let us know if her pain is not improving and we can refer to sports medicine.

## 2022-04-21 ENCOUNTER — Ambulatory Visit (HOSPITAL_COMMUNITY): Payer: Medicare HMO | Attending: Nurse Practitioner

## 2022-04-21 DIAGNOSIS — I10 Essential (primary) hypertension: Secondary | ICD-10-CM | POA: Diagnosis not present

## 2022-04-21 DIAGNOSIS — R5383 Other fatigue: Secondary | ICD-10-CM | POA: Insufficient documentation

## 2022-04-21 DIAGNOSIS — I340 Nonrheumatic mitral (valve) insufficiency: Secondary | ICD-10-CM | POA: Diagnosis not present

## 2022-04-21 DIAGNOSIS — I371 Nonrheumatic pulmonary valve insufficiency: Secondary | ICD-10-CM

## 2022-04-21 DIAGNOSIS — E785 Hyperlipidemia, unspecified: Secondary | ICD-10-CM | POA: Diagnosis not present

## 2022-04-21 LAB — ECHOCARDIOGRAM COMPLETE
Area-P 1/2: 3.63 cm2
S' Lateral: 3 cm

## 2022-04-27 ENCOUNTER — Other Ambulatory Visit: Payer: Self-pay | Admitting: Family Medicine

## 2022-04-28 NOTE — Progress Notes (Signed)
Cardiology Clinic Note   Patient Name: Tracey Saunders Date of Encounter: 04/29/2022  Primary Care Provider:  Tawnya Crook, MD Primary Cardiologist:  Quay Burow, MD  Patient Profile    66 year old female with history of HTN, remote tobacco abuse, and HL. Has been seen on cardiology follow up since 05/23/2019 by Dr. Gwenlyn Found. She had recent echocardiogram on 04/21/2022 which revealed normal LVEF of 60-65%, with Grade I diastolic dysfunction. Mild MR and TR.   Past Medical History    Past Medical History:  Diagnosis Date   Anxiety disorder 09/09/2019   Cancer [facility])    cervical cancer   Depression    Diverticulitis    Fatty liver    GERD (gastroesophageal reflux disease)    Hyperlipidemia    Hypertension    Insomnia 09/09/2019   Liver cyst    Vitamin D deficiency    Past Surgical History:  Procedure Laterality Date   ABDOMINAL HYSTERECTOMY     COLON RESECTION     due to diverticulitis    Allergies  Allergies  Allergen Reactions   Aleve-D Sinus & Cold [Pseudoephedrine-Naproxen Na Er]    Lisinopril Swelling   Losartan Swelling    History of Present Illness    Mrs. Tracey Saunders comes today in her usual state of health without any new cardiac complaints.  She continues chronic fatigue, musculoskeletal pain, especially in her back and right leg.  She uses a cane for ambulation.  She is medically compliant.  She requests explanation concerning her most recent echocardiogram.  Home Medications    Current Outpatient Medications  Medication Sig Dispense Refill   AMLODIPINE BESYLATE PO Take 10 mg by mouth daily.     Artificial Saliva (BIOTENE DRY MOUTH MOISTURIZING) SOLN USe daily as needed for dry mouth 443 mL 0   azelastine (ASTELIN) 0.1 % nasal spray Place 2 sprays into both nostrils 2 (two) times daily. Use in each nostril as directed 30 mL 5   buPROPion (WELLBUTRIN XL) 150 MG 24 hr tablet Take 1 tablet (150 mg total) by mouth daily. 30 tablet 0   diclofenac Sodium  (VOLTAREN) 1 % GEL Apply 4 g topically 4 (four) times daily as needed. 100 g 3   furosemide (LASIX) 20 MG tablet Take 1 tablet (20 mg total) by mouth 2 (two) times daily. 180 tablet 3   gabapentin (NEURONTIN) 300 MG capsule Take 2 capsules (600 mg total) by mouth 3 (three) times daily. 540 capsule 1   Humidifiers MISC Use as needed to prevent dry mouth 1 each 0   levocetirizine (XYZAL) 5 MG tablet Take 5 mg by mouth 2 (two) times daily as needed.     omeprazole (PRILOSEC) 40 MG capsule Take 1 tablet by mouth daily.     potassium chloride SA (KLOR-CON M) 20 MEQ tablet Take 1 tablet (20 mEq total) by mouth 2 (two) times daily. 180 tablet 1   sertraline (ZOLOFT) 50 MG tablet Take 1.5 tablets (75 mg total) by mouth at bedtime. 135 tablet 3   tizanidine (ZANAFLEX) 2 MG capsule Take 1 capsule (2 mg total) by mouth 3 (three) times daily. 30 capsule 0   traZODone (DESYREL) 50 MG tablet Take 2 tablets (100 mg total) by mouth at bedtime as needed for sleep. 180 tablet 1   atorvastatin (LIPITOR) 20 MG tablet Take 1 tablet (20 mg total) by mouth daily. 90 tablet 1   Hot/Cold Therapy Aids (HOT/COLD PACK) PADS Use as directed (Patient not taking: Reported on  04/29/2022) 3 each 0   HYDROcodone-acetaminophen (NORCO/VICODIN) 5-325 MG tablet Take 1 tablet by mouth every 6 (six) hours as needed for moderate pain. (Patient not taking: Reported on 04/29/2022) 30 tablet 0   nitrofurantoin, macrocrystal-monohydrate, (MACROBID) 100 MG capsule Take 1 capsule (100 mg total) by mouth 2 (two) times daily. (Patient not taking: Reported on 04/29/2022) 14 capsule 0   predniSONE (DELTASONE) 50 MG tablet Take 1 tablet daily for 5 days. (Patient not taking: Reported on 04/29/2022) 5 tablet 0   No current facility-administered medications for this visit.     Family History    Family History  Problem Relation Age of Onset   Colon cancer Neg Hx    Esophageal cancer Neg Hx    Stomach cancer Neg Hx    Rectal cancer Neg Hx     She indicated that her mother is deceased. She indicated that both of her sisters are alive. She indicated that her maternal grandmother is deceased. She indicated that her maternal grandfather is deceased. She indicated that her paternal grandmother is deceased. She indicated that her paternal grandfather is deceased. She indicated that her son is alive. She indicated that the status of her neg hx is unknown.  Social History    Social History   Socioeconomic History   Marital status: Single    Spouse name: Not on file   Number of children: 1   Years of education: Not on file   Highest education level: Not on file  Occupational History   Occupation: retired  Tobacco Use   Smoking status: Light Smoker    Packs/day: 0.50    Types: Cigarettes   Smokeless tobacco: Never  Vaping Use   Vaping Use: Never used  Substance and Sexual Activity   Alcohol use: Yes    Comment: occasional beer or bloody mary once a week   Drug use: Never   Sexual activity: Not on file  Other Topics Concern   Not on file  Social History Narrative   Not on file   Social Determinants of Health   Financial Resource Strain: Low Risk  (03/24/2022)   Overall Financial Resource Strain (CARDIA)    Difficulty of Paying Living Expenses: Not very hard  Recent Concern: Emergency planning/management officer Strain - High Risk (02/16/2022)   Overall Financial Resource Strain (CARDIA)    Difficulty of Paying Living Expenses: Hard  Food Insecurity: No Food Insecurity (03/24/2022)   Hunger Vital Sign    Worried About Running Out of Food in the Last Year: Never true    [place] in the Last Year: Never true  Recent Concern: Food Insecurity - Food Insecurity Present (02/16/2022)   Hunger Vital Sign    Worried About [place] in the Last Year: Sometimes true    Ran Out of Food in the Last Year: Sometimes true  Transportation Needs: No Transportation Needs (03/24/2022)   PRAPARE - Hydrologist  (Medical): No    Lack of Transportation (Non-Medical): No  Physical Activity: Sufficiently Active (03/24/2022)   Exercise Vital Sign    Days of Exercise per Week: 5 days    Minutes of Exercise per Session: 30 min  Stress: Stress Concern Present (03/24/2022)   [place]    Feeling of Stress : To some extent  Social Connections: Socially Isolated (03/24/2022)   Social Connection and Isolation Panel [NHANES]    Frequency of Communication with Friends and  Family: More than three times a week    Frequency of Social Gatherings with Friends and Family: Three times a week    Attends Religious Services: Never    Active Member of Clubs or Organizations: No    Attends Archivist Meetings: Never    Marital Status: Never married  Intimate Partner Violence: Not At Risk (03/24/2022)   Humiliation, Afraid, Rape, and Kick questionnaire    Fear of Current or Ex-Partner: No    Emotionally Abused: No    Physically Abused: No    Sexually Abused: No     Review of Systems    General:  No chills, fever, night sweats or weight changes.  Cardiovascular:  No chest pain, dyspnea on exertion, edema, orthopnea, palpitations, paroxysmal nocturnal dyspnea. Dermatological: No rash, lesions/masses Respiratory: No cough, dyspnea Urologic: No hematuria, dysuria Abdominal:   No nausea, vomiting, diarrhea, bright red blood per rectum, melena, or hematemesis Neurologic:  No visual changes, wkns, changes in mental status. All other systems reviewed and are otherwise negative except as noted above.     Physical Exam    VS:  BP 124/82 (BP Location: Left Arm, Patient Position: Sitting, Cuff Size: Normal)   Pulse 76   Ht '5\' 1"'$  (1.549 m)   Wt 146 lb 6.4 oz (66.4 kg)   SpO2 99%   BMI 27.66 kg/m  , BMI Body mass index is 27.66 kg/m.     GEN: Well nourished, well developed, in no acute distress.  Frail appearing HEENT: normal. Neck: Supple,  no JVD, carotid bruits, or masses. Cardiac: RRR, no murmurs, rubs, or gallops. No clubbing, cyanosis, edema.  Radials/DP/PT 2+ and equal bilaterally.  Respiratory:  Respirations regular and unlabored, clear to auscultation bilaterally. GI: Soft, nontender, nondistended, BS + x 4. MS: no deformity or atrophy.  Pain with range of motion on the right leg and movement of the torso and the back Skin: warm and dry, no rash. Neuro:  Strength and sensation are intact. Psych: Normal affect.  Accessory Clinical Findings     Lab Results  Component Value Date   WBC 8.0 04/15/2022   HGB 13.0 04/15/2022   HCT 38.1 04/15/2022   MCV 93.9 04/15/2022   PLT 266.0 04/15/2022   Lab Results  Component Value Date   CREATININE 0.89 04/15/2022   BUN 13 04/15/2022   NA 140 04/15/2022   K 4.0 04/15/2022   CL 100 04/15/2022   CO2 31 04/15/2022   Lab Results  Component Value Date   ALT 11 04/15/2022   AST 15 04/15/2022   ALKPHOS 73 04/15/2022   BILITOT 0.3 04/15/2022   Lab Results  Component Value Date   CHOL 165 05/05/2021   HDL 67.40 05/05/2021   LDLCALC 82 05/05/2021   TRIG 80.0 05/05/2021   CHOLHDL 2 05/05/2021    Lab Results  Component Value Date   HGBA1C 5.2 02/24/2020    Review of Prior Studies: Echocardiogram 04/21/2022 1. Left ventricular ejection fraction, by estimation, is 60 to 65%. Left  ventricular ejection fraction by 3D volume is 61 %. The left ventricle has  normal function. The left ventricle has no regional wall motion  abnormalities. Left ventricular diastolic   parameters are consistent with Grade I diastolic dysfunction (impaired  relaxation). The average left ventricular global longitudinal strain is  -18.8 %. The global longitudinal strain is normal.   2. Right ventricular systolic function is normal. The right ventricular  size is normal. There is normal pulmonary artery systolic  pressure.   3. The mitral valve is grossly normal. Mild mitral valve  regurgitation.  No evidence of mitral stenosis.   4. The aortic valve is tricuspid. Aortic valve regurgitation is not  visualized. Aortic valve sclerosis is present, with no evidence of aortic  valve stenosis.   5. The inferior vena cava is normal in size with greater than 50%  respiratory variability, suggesting right atrial pressure of 3 mmHg.   Assessment & Plan   1.  Hypertension: Blood pressures well controlled on her current medication regimen.  Continue same.  Labs that have recently been completed by her primary care provider.  I reviewed her echocardiogram results with her.  She has a normal LVEF with only mild MR.  She is given reassurance concerning her heart functioning status.  2.  Grade 1 diastolic dysfunction: No evidence of volume overload at this time.  She uses Lasix as needed.  No changes.  3.  Hypercholesterolemia: Remains on atorvastatin 20 mg daily.  PCP is managing labs.  If not drawn on follow-up up appointment we will do so.  Current medicines are reviewed at length with the patient today.  I have spent 25 min's  dedicated to the care of this patient on the date of this encounter to include pre-visit review of records, assessment, management and diagnostic testing,with shared decision making. Signed, Phill Myron. West Pugh, ANP, AACC   04/29/2022 1:13 PM      Office [phone] Fax [phone]  Notice: This dictation was prepared with Dragon dictation along with smaller phrase technology. Any transcriptional errors that result from this process are unintentional and may not be corrected upon review.

## 2022-04-29 ENCOUNTER — Ambulatory Visit: Payer: Medicare HMO | Attending: Adult Health | Admitting: Adult Health

## 2022-04-29 ENCOUNTER — Encounter: Payer: Self-pay | Admitting: Adult Health

## 2022-04-29 VITALS — BP 124/82 | HR 76 | Ht 61.0 in | Wt 146.4 lb

## 2022-04-29 DIAGNOSIS — E7800 Pure hypercholesterolemia, unspecified: Secondary | ICD-10-CM

## 2022-04-29 DIAGNOSIS — Z87891 Personal history of nicotine dependence: Secondary | ICD-10-CM | POA: Diagnosis not present

## 2022-04-29 DIAGNOSIS — I5032 Chronic diastolic (congestive) heart failure: Secondary | ICD-10-CM

## 2022-04-29 DIAGNOSIS — R5383 Other fatigue: Secondary | ICD-10-CM | POA: Diagnosis not present

## 2022-04-29 DIAGNOSIS — I10 Essential (primary) hypertension: Secondary | ICD-10-CM | POA: Diagnosis not present

## 2022-04-29 MED ORDER — ATORVASTATIN CALCIUM 20 MG PO TABS: 20.0000 mg | ORAL_TABLET | Freq: Every day | ORAL | 1 refills | Status: DC

## 2022-04-29 NOTE — Patient Instructions (Signed)
Medication Instructions:  No Changes *If you need a refill on your cardiac medications before your next appointment, please call your pharmacy*   Lab Work: No Labs If you have labs (blood work) drawn today and your tests are completely normal, you will receive your results only by: [place] (if you have MyChart) OR A paper copy in the mail If you have any lab test that is abnormal or we need to change your treatment, we will call you to review the results.   Testing/Procedures: No Testing   Follow-Up: At [facility], you and your health needs are our priority.  As part of our continuing mission to provide you with exceptional heart care, we have created designated Provider Care Teams.  These Care Teams include your primary Cardiologist (physician) and Advanced Practice Providers (APPs -  Physician Assistants and Nurse Practitioners) who all work together to provide you with the care you need, when you need it.  We recommend signing up for the patient portal called "MyChart".  Sign up information is provided on this After Visit Summary.  MyChart is used to connect with patients for Virtual Visits (Telemedicine).  Patients are able to view lab/test results, encounter notes, upcoming appointments, etc.  Non-urgent messages can be sent to your provider as well.   To learn more about what you can do with MyChart, go to [email or web address].    Your next appointment:   1 year(s)  The format for your next appointment:   In Person  Provider:   Quay Burow, MD

## 2022-04-30 ENCOUNTER — Other Ambulatory Visit: Payer: Self-pay | Admitting: Family Medicine

## 2022-05-02 NOTE — Telephone Encounter (Signed)
Tried calling patient, unable to leave a message, will call again later.

## 2022-05-03 NOTE — Telephone Encounter (Signed)
Patient stated that she does not use the flonase anymore.

## 2022-05-09 ENCOUNTER — Telehealth: Payer: Self-pay | Admitting: Family Medicine

## 2022-05-09 NOTE — Telephone Encounter (Signed)
Pt states: -received a message from [facility] that new prescription is needed.  Pr requests: -call back if Rx cannot be sent    LAST APPOINTMENT DATE:  04/15/22 OV with CP,MD 03/17/22 OV with CP,MD 02/16/22 OV with PCP   NEXT APPOINTMENT DATE: N/A   MEDICATION: sertraline (ZOLOFT) 50 MG tablet [[id number]]    Is the patient out of medication?  No, pt states she has "30 days left"   PHARMACY: [facility], [place] [facility]istered [address] [place], [place] [address] Phone: [phone]  Fax: [phone]

## 2022-05-09 NOTE — Telephone Encounter (Signed)
Spoke to pharmacy and they stated patient medication was just sent on 04/23/2022 and she still has 2 refills left at the pharmacy.

## 2022-05-09 NOTE — Telephone Encounter (Signed)
Tried calling patient to get further information. Unable to leave a message, mailbox full.

## 2022-05-10 ENCOUNTER — Other Ambulatory Visit: Payer: Self-pay | Admitting: Family Medicine

## 2022-05-18 ENCOUNTER — Other Ambulatory Visit: Payer: Self-pay | Admitting: Family Medicine

## 2022-05-26 ENCOUNTER — Ambulatory Visit: Payer: Medicare HMO | Admitting: Family Medicine

## 2022-06-01 ENCOUNTER — Telehealth (INDEPENDENT_AMBULATORY_CARE_PROVIDER_SITE_OTHER): Payer: Medicare HMO | Admitting: Internal Medicine

## 2022-06-01 ENCOUNTER — Encounter: Payer: Self-pay | Admitting: Internal Medicine

## 2022-06-01 VITALS — Ht 61.0 in | Wt 141.0 lb

## 2022-06-01 DIAGNOSIS — J22 Unspecified acute lower respiratory infection: Secondary | ICD-10-CM

## 2022-06-01 DIAGNOSIS — J040 Acute laryngitis: Secondary | ICD-10-CM

## 2022-06-01 DIAGNOSIS — Z720 Tobacco use: Secondary | ICD-10-CM | POA: Diagnosis not present

## 2022-06-01 MED ORDER — AMOXICILLIN 500 MG PO CAPS: 500.0000 mg | ORAL_CAPSULE | Freq: Three times a day (TID) | ORAL | 0 refills | Status: DC

## 2022-06-01 NOTE — Progress Notes (Signed)
Virtual Visit via Video Note  I connected with Yezenia Fredrick on 06/01/22 at 11:30 AM EST by a video enabled telemedicine application and verified that I am speaking with the correct person using two identifiers. Location patient: home Location provider:work  office Persons participating in the virtual visit: patient, provider  Patient aware  of the limitations of evaluation and management by telemedicine and  availability of in person appointments. and agreed to proceed.   HPI: Tracey Saunders presents for video visit for acute illness   st  hoarse  and cough  thick brown mucous .  No cp sob wheezing today  no face pain  Hoarseness has lasted over a week and  thick brown phlegm is a problem    Using mucinex  never used an ihlaer but  may have had asthma in past? Tobacco 6 per day  has quit in past   ROS: See pertinent positives and negatives per HPI.  Past Medical History:  Diagnosis Date   Anxiety disorder 09/09/2019   Cancer [facility])    cervical cancer   Depression    Diverticulitis    Fatty liver    GERD (gastroesophageal reflux disease)    Hyperlipidemia    Hypertension    Insomnia 09/09/2019   Liver cyst    Vitamin D deficiency     Past Surgical History:  Procedure Laterality Date   ABDOMINAL HYSTERECTOMY     COLON RESECTION     due to diverticulitis    Family History  Problem Relation Age of Onset   Colon cancer Neg Hx    Esophageal cancer Neg Hx    Stomach cancer Neg Hx    Rectal cancer Neg Hx     Social History   Tobacco Use   Smoking status: Light Smoker    Packs/day: 0.50    Types: Cigarettes   Smokeless tobacco: Never  Vaping Use   Vaping Use: Never used  Substance Use Topics   Alcohol use: Yes    Comment: occasional beer or bloody mary once a week   Drug use: Never      Current Outpatient Medications:    AMLODIPINE BESYLATE PO, Take 10 mg by mouth daily., Disp: , Rfl:    amoxicillin (AMOXIL) 500 MG capsule, Take 1 capsule (500 mg total) by mouth  3 (three) times daily., Disp: 21 capsule, Rfl: 0   Artificial Saliva (BIOTENE DRY MOUTH MOISTURIZING) SOLN, USe daily as needed for dry mouth, Disp: 443 mL, Rfl: 0   atorvastatin (LIPITOR) 20 MG tablet, Take 1 tablet (20 mg total) by mouth daily., Disp: 90 tablet, Rfl: 1   azelastine (ASTELIN) 0.1 % nasal spray, Place 2 sprays into both nostrils 2 (two) times daily. Use in each nostril as directed, Disp: 30 mL, Rfl: 5   buPROPion (WELLBUTRIN XL) 150 MG 24 hr tablet, Take 1 tablet by mouth once daily, Disp: 30 tablet, Rfl: 0   diclofenac Sodium (VOLTAREN) 1 % GEL, Apply 4 g topically 4 (four) times daily as needed., Disp: 100 g, Rfl: 3   furosemide (LASIX) 20 MG tablet, Take 1 tablet (20 mg total) by mouth 2 (two) times daily., Disp: 180 tablet, Rfl: 3   gabapentin (NEURONTIN) 300 MG capsule, Take 2 capsules (600 mg total) by mouth 3 (three) times daily., Disp: 540 capsule, Rfl: 1   Hot/Cold Therapy Aids (HOT/COLD PACK) PADS, Use as directed, Disp: 3 each, Rfl: 0   Humidifiers MISC, Use as needed to prevent dry mouth, Disp: 1 each,  Rfl: 0   levocetirizine (XYZAL) 5 MG tablet, Take 5 mg by mouth 2 (two) times daily as needed., Disp: , Rfl:    omeprazole (PRILOSEC) 40 MG capsule, Take 1 tablet by mouth daily., Disp: , Rfl:    potassium chloride SA (KLOR-CON M) 20 MEQ tablet, Take 1 tablet (20 mEq total) by mouth 2 (two) times daily., Disp: 180 tablet, Rfl: 1   sertraline (ZOLOFT) 50 MG tablet, Take 1.5 tablets (75 mg total) by mouth at bedtime., Disp: 135 tablet, Rfl: 3   traZODone (DESYREL) 50 MG tablet, TAKE 2 TABLETS AT BEDTIME  AS NEEDED FOR SLEEP, Disp: 180 tablet, Rfl: 1   tizanidine (ZANAFLEX) 2 MG capsule, Take 1 capsule (2 mg total) by mouth 3 (three) times daily. (Patient not taking: Reported on 06/01/2022), Disp: 30 capsule, Rfl: 0  EXAM: BP Readings from Last 3 Encounters:  04/29/22 124/82  04/15/22 132/81  03/17/22 118/74    VITALS per patient if applicable:  GENERAL: alert,  oriented, appears well and in no acute distress hoarse  ocass cough no stridor    HEENT: atraumatic, conjunttiva clear, no obvious abnormalities on inspection of external nose and ears  NECK: normal movements of the head and neck  LUNGS: on inspection no signs of respiratory distress, breathing rate appears normal, no obvious gross SOB, gasping or wheezing  CV: no obvious cyanosis  MS: moves all visible extremities without noticeable abnormality  PSYCH/NEURO: pleasant and cooperative, no obvious depression or anxiety, speech and thought processing grossly intact Lab Results  Component Value Date   WBC 8.0 04/15/2022   HGB 13.0 04/15/2022   HCT 38.1 04/15/2022   PLT 266.0 04/15/2022   GLUCOSE 95 04/15/2022   CHOL 165 05/05/2021   TRIG 80.0 05/05/2021   HDL 67.40 05/05/2021   LDLCALC 82 05/05/2021   ALT 11 04/15/2022   AST 15 04/15/2022   NA 140 04/15/2022   K 4.0 04/15/2022   CL 100 04/15/2022   CREATININE 0.89 04/15/2022   BUN 13 04/15/2022   CO2 31 04/15/2022   TSH 3.04 08/26/2021   HGBA1C 5.2 02/24/2020   MICROALBUR <0.7 12/16/2021    ASSESSMENT AND PLAN:  Discussed the following assessment and plan:    ICD-10-CM   1. Acute laryngitis  J04.0     2. Tobacco use  Z72.0     3. Acute respiratory infection  J22      Sounds acute viral but with now persistnace and brown phelgm ( could be tocacco or old blood)  will do empiric antibiontc( holiday weekend)  no other alarm findings  Encouragement to stop tpbacco again as has done in past .  Counseled.   Expectant management and discussion of plan and treatment with opportunity to ask questions and all were answered. The patient agreed with the plan and demonstrated an understanding of the instructions.   Advised to call back or seek an in-person evaluation if worsening  or having  further concerns  in interim. Return if symptoms worsen or fail to improve.    Shanon Ace, MD

## 2022-06-08 ENCOUNTER — Encounter: Payer: Self-pay | Admitting: Family Medicine

## 2022-06-08 ENCOUNTER — Ambulatory Visit (INDEPENDENT_AMBULATORY_CARE_PROVIDER_SITE_OTHER): Payer: Medicare HMO | Admitting: Family Medicine

## 2022-06-08 VITALS — BP 126/83 | HR 80 | Temp 97.7°F | Ht 61.0 in | Wt 148.0 lb

## 2022-06-08 DIAGNOSIS — R1310 Dysphagia, unspecified: Secondary | ICD-10-CM

## 2022-06-08 DIAGNOSIS — E538 Deficiency of other specified B group vitamins: Secondary | ICD-10-CM

## 2022-06-08 DIAGNOSIS — J040 Acute laryngitis: Secondary | ICD-10-CM | POA: Diagnosis not present

## 2022-06-08 DIAGNOSIS — M25511 Pain in right shoulder: Secondary | ICD-10-CM | POA: Diagnosis not present

## 2022-06-08 DIAGNOSIS — M25571 Pain in right ankle and joints of right foot: Secondary | ICD-10-CM | POA: Diagnosis not present

## 2022-06-08 DIAGNOSIS — M79671 Pain in right foot: Secondary | ICD-10-CM

## 2022-06-08 DIAGNOSIS — I10 Essential (primary) hypertension: Secondary | ICD-10-CM

## 2022-06-08 MED ORDER — CYANOCOBALAMIN 1000 MCG/ML IJ SOLN
1000.0000 ug | Freq: Once | INTRAMUSCULAR | Status: AC
  Administered 2022-06-08: 1000 ug via INTRAMUSCULAR

## 2022-06-08 MED ORDER — TIZANIDINE HCL 2 MG PO CAPS: 2.0000 mg | ORAL_CAPSULE | Freq: Three times a day (TID) | ORAL | 1 refills | Status: DC

## 2022-06-08 MED ORDER — PREDNISONE 20 MG PO TABS: 40.0000 mg | ORAL_TABLET | Freq: Every day | ORAL | 0 refills | Status: AC

## 2022-06-08 NOTE — Progress Notes (Signed)
Subjective:     Patient ID: Tracey Saunders, female    DOB: [birth year], 66 y.o.   MRN: [id number]  Chief Complaint  Patient presents with   Follow-up    Pt states she has bad ankle and shoulder. Pt also has laryngitis. Pt wanting X-Ray of foot.    Laryngitis    HPI  HTN-doing well most days Laryngitis-illness-amoxicillin.  Still hoarse.  Cough better.   Long time, mucus thick in throat.  Some dysphagia-still taking PPI R foot pain-long time-top of foot-hard to sleep-worse more the past 2 wks.  No new injury or fall, but worse and requests x-ray.  R shoulder pain rad to deltoid-hard to lift it. Past 2 wks.  Icing. No fall/injury. Moves things at home to clean. May have pulled it.  B12 dev-not getting injections regularly d/t xportation.  Still chronically fatigued.   Health Maintenance Due  Topic Date Due   DTaP/Tdap/Td (1 - Tdap) Never done    Past Medical History:  Diagnosis Date   Anxiety disorder 09/09/2019   Cancer ([place])    cervical cancer   Depression    Diverticulitis    Fatty liver    GERD (gastroesophageal reflux disease)    Hyperlipidemia    Hypertension    Insomnia 09/09/2019   Liver cyst    Vitamin D deficiency     Past Surgical History:  Procedure Laterality Date   ABDOMINAL HYSTERECTOMY     COLON RESECTION     due to diverticulitis    Outpatient Medications Prior to Visit  Medication Sig Dispense Refill   AMLODIPINE BESYLATE PO Take 10 mg by mouth daily.     amoxicillin (AMOXIL) 500 MG capsule Take 1 capsule (500 mg total) by mouth 3 (three) times daily. 21 capsule 0   Artificial Saliva (BIOTENE DRY MOUTH MOISTURIZING) SOLN USe daily as needed for dry mouth 443 mL 0   atorvastatin (LIPITOR) 20 MG tablet Take 1 tablet (20 mg total) by mouth daily. 90 tablet 1   azelastine (ASTELIN) 0.1 % nasal spray Place 2 sprays into both nostrils 2 (two) times daily. Use in each nostril as directed 30 mL 5   buPROPion (WELLBUTRIN XL) 150 MG 24 hr tablet Take 1 tablet  by mouth once daily 30 tablet 0   diclofenac Sodium (VOLTAREN) 1 % GEL Apply 4 g topically 4 (four) times daily as needed. 100 g 3   furosemide (LASIX) 20 MG tablet Take 1 tablet (20 mg total) by mouth 2 (two) times daily. 180 tablet 3   gabapentin (NEURONTIN) 300 MG capsule Take 2 capsules (600 mg total) by mouth 3 (three) times daily. 540 capsule 1   Hot/Cold Therapy Aids (HOT/COLD PACK) PADS Use as directed 3 each 0   Humidifiers MISC Use as needed to prevent dry mouth 1 each 0   levocetirizine (XYZAL) 5 MG tablet Take 5 mg by mouth 2 (two) times daily as needed.     omeprazole (PRILOSEC) 40 MG capsule Take 1 tablet by mouth daily.     potassium chloride SA (KLOR-CON M) 20 MEQ tablet Take 1 tablet (20 mEq total) by mouth 2 (two) times daily. 180 tablet 1   sertraline (ZOLOFT) 50 MG tablet Take 1.5 tablets (75 mg total) by mouth at bedtime. 135 tablet 3   traZODone (DESYREL) 50 MG tablet TAKE 2 TABLETS AT BEDTIME  AS NEEDED FOR SLEEP 180 tablet 1   tizanidine (ZANAFLEX) 2 MG capsule Take 1 capsule (2 mg total) by mouth  3 (three) times daily. 30 capsule 0   No facility-administered medications prior to visit.    Allergies  Allergen Reactions   Aleve-D Sinus & Cold [Pseudoephedrine-Naproxen Na Er]    Lisinopril Swelling   Losartan Swelling   ROS neg/noncontributory except as noted HPI/below      Objective:     BP 126/83 (BP Location: Left Arm, Patient Position: Sitting)   Pulse 80   Temp 97.7 F (36.5 C) (Temporal)   Ht '5\' 1"'$  (1.549 m)   Wt 148 lb (67.1 kg)   SpO2 98%   BMI 27.96 kg/m  Wt Readings from Last 3 Encounters:  06/08/22 148 lb (67.1 kg)  06/01/22 141 lb (64 kg)  04/29/22 146 lb 6.4 oz (66.4 kg)    Physical Exam   Gen: WDWN NAD HEENT: NCAT, conjunctiva not injected, sclera nonicteric TM WNL B, OP moist, no exudates patient is somewhat hoarse NECK:  supple, no thyromegaly, no nodes, no carotid bruits CARDIAC: RRR, S1S2+, no murmur. DP 1+B LUNGS: CTAB. No  wheezes ABDOMEN:  BS+, soft, NTND, No HSM, no masses EXT:  no edema MSK: Patient using a cane.  Right foot-some tenderness to palpation dorsum of foot near ankle.  Lateral malleolus-slightly tender medial side.  She does have a mobile nodule as well. Right shoulder-positive tenderness to palpation bicipital groove.  Decreased range of motion with abduction.  Strength 4+/5-more limited due to pain. NEURO: A&O x3.  CN II-XII intact.  PSYCH: normal mood. Good eye contact      Assessment & Plan:   Problem List Items Addressed This Visit       Cardiovascular and Mediastinum   Essential hypertension     Other   Vitamin B12 deficiency - Primary   Other Visit Diagnoses     Acute pain of right shoulder       Relevant Orders   DG Shoulder Right   Acute right ankle pain       Relevant Orders   DG Ankle Complete Right   Right foot pain       Relevant Orders   DG Foot Complete Right   Acute laryngitis       Dysphagia, unspecified type       Relevant Orders   Ambulatory referral to Gastroenterology     1.  B12 deficiency-patient was given a B12 injection today. 2.  Hypertension-chronic.  Currently controlled.  Continue meds 3 laryngitis-due to upper respiratory infection.  URI has improved with amoxicillin, however has residual hoarseness.  Advised that this is common.  Drink plenty of fluids.  Sent prednisone 40 mg daily for 5 days to pharmacy (patient stated she probably will not pick up until Friday) 4.  Dysphagia-patient is already on a PPI.  Will refer to GI-I had forgotten to tell her this while she was at the office 5.  Right ankle and foot pain-acute on chronic.  Prescription sent for prednisone 40 mg daily.  Order written for x-rays.  She would like to wait till those results come back to discuss this further plan 6.  Right shoulder pain-impingement versus rotator cuff.  There is no specific injury.  Will do prednisone 40 mg daily.  Ordered x-ray of right  shoulder.   Follow-up 1 month Meds ordered this encounter  Medications   cyanocobalamin (VITAMIN B12) injection 1,000 mcg   tizanidine (ZANAFLEX) 2 MG capsule    Sig: Take 1 capsule (2 mg total) by mouth 3 (three) times daily.    Dispense:  30 capsule    Refill:  1   predniSONE (DELTASONE) 20 MG tablet    Sig: Take 2 tablets (40 mg total) by mouth daily with breakfast for 5 days.    Dispense:  10 tablet    Refill:  0    Wellington Hampshire, MD

## 2022-06-08 NOTE — Patient Instructions (Addendum)
get X-ray/labs at [facility].  [place]  hours 8=M-F 8:30-5.  closed 12:30-1 lunch   Send prednisone and zanaflex to pharmacy

## 2022-06-14 ENCOUNTER — Ambulatory Visit (INDEPENDENT_AMBULATORY_CARE_PROVIDER_SITE_OTHER)
Admission: RE | Admit: 2022-06-14 | Discharge: 2022-06-14 | Disposition: A | Discharge status: Home / Self Care | Payer: Medicare HMO | Source: Ambulatory Visit | Attending: Family Medicine | Admitting: Family Medicine

## 2022-06-14 DIAGNOSIS — M25511 Pain in right shoulder: Secondary | ICD-10-CM

## 2022-06-14 DIAGNOSIS — M25571 Pain in right ankle and joints of right foot: Secondary | ICD-10-CM

## 2022-06-14 DIAGNOSIS — M7731 Calcaneal spur, right foot: Secondary | ICD-10-CM | POA: Diagnosis not present

## 2022-06-14 DIAGNOSIS — M79671 Pain in right foot: Secondary | ICD-10-CM | POA: Diagnosis not present

## 2022-06-14 DIAGNOSIS — M7989 Other specified soft tissue disorders: Secondary | ICD-10-CM | POA: Diagnosis not present

## 2022-06-16 NOTE — Progress Notes (Signed)
I, Peterson Lombard, LAT, ATC acting as a scribe for Lynne Leader, MD.  Subjective:    CC: R foot/ankle & R shoulder pain  HPI: Pt is a 66 y/o female c/o R foot/ankle and R shoulder pain.  R foot/ankle: Pt reports chronic in nature, 6+ months, worsening over the past 3 weeks. Pt locates pain to the lateral aspect of the ankle and onto the dorsum of her foot.   R foot/ankle swelling: yes Aggravates: walking, increased activity,  Treatments tried: ice  R shoulder pain: Pt reports R shoulder pain ongoing for about 3 weeks. No falls/injury. She had been doing some cleaning at her home and was moving things around. Pt locates pain to over the [facility] joint and into the proximal upper arm.   Neck pain: no Radiates: no Aggravates: IR/ER, shoulder aBd Treatments tried: ice,   Dx imaging: 06/14/22 R foot, R ankle, & R shoulder XR  09/17/21 C-spine XR  Pertinent review of Systems: No fevers or chills tobacco counseling in today  Relevant historical information: Hypertension   Objective:    Vitals:   06/17/22 0859  BP: (!) 152/98  Pulse: 74  SpO2: 99%   General: Well Developed, well nourished, and in no acute distress.   MSK: Right shoulder: Normal-appearing Normal motion pain with abduction. Strength 4/5 abduction 5/5 external and internal rotation. Positive Hawkins and Neer's test.  Positive empty can test. Negative Yergason's and speeds test.  Right ankle normal-appearing Tender palpation at lateral joint ATFL region and at lateral malleolus.  Nontender at medial joint. Normal ankle motion.  Intact strength. Stable ligamentous exam.  Lab and Radiology Results  Procedure: Real-time Ultrasound Guided Injection of right shoulder subacromial bursa Device: Philips Affiniti 50G Images permanently stored and available for review in PACS Verbal informed consent obtained.  Discussed risks and benefits of procedure. Warned about infection, bleeding, hyperglycemia damage to  structures among others. Patient expresses understanding and agreement Time-out conducted.   Noted no overlying erythema, induration, or other signs of local infection.   Skin prepped in a sterile fashion.   Local anesthesia: Topical Ethyl chloride.   With sterile technique and under real time ultrasound guidance: 40 mg of Kenalog and 2 mL of Marcaine injected into subacromial bursa. Fluid seen entering the bursa.   Completed without difficulty   Pain moderately resolved suggesting accurate placement of the medication.   Advised to call if fevers/chills, erythema, induration, drainage, or persistent bleeding.   Images permanently stored and available for review in the ultrasound unit.  Impression: Technically successful ultrasound guided injection.    Procedure: Real-time Ultrasound Guided Injection of right ankle lateral joint Device: Philips Affiniti 50G Images permanently stored and available for review in PACS Ultrasound evaluation prior to injection reveals degenerative changes at lateral joint line.  Normal-appearing peroneal tendons. Verbal informed consent obtained.  Discussed risks and benefits of procedure. Warned about infection, bleeding, hyperglycemia damage to structures among others. Patient expresses understanding and agreement Time-out conducted.   Noted no overlying erythema, induration, or other signs of local infection.   Skin prepped in a sterile fashion.   Local anesthesia: Topical Ethyl chloride.   With sterile technique and under real time ultrasound guidance: 40 mg of Kenalog and 1 mL of Marcaine injected into ankle joint. Fluid seen entering the joint capsule.   Completed without difficulty   Pain moderately  resolved suggesting accurate placement of the medication.   Advised to call if fevers/chills, erythema, induration, drainage, or persistent bleeding.  Images permanently stored and available for review in the ultrasound unit.  Impression: Technically  successful ultrasound guided injection.      No results found for this or any previous visit (from the past 72 hour(s)). DG Shoulder Right  Result Date: 06/15/2022 CLINICAL DATA:  Right shoulder pain and limited range of motion for 4 months. EXAM: RIGHT SHOULDER - 2+ VIEW COMPARISON:  None Available. FINDINGS: There is diffuse decreased bone mineralization. Mild acromioclavicular joint space narrowing and peripheral osteophytosis. No acute fracture is seen. No dislocation. The visualized portion of the right lung is unremarkable. IMPRESSION: Mild acromioclavicular osteoarthritis. Electronically Signed   By: Yvonne Kendall M.D.   On: 06/15/2022 15:47   DG Ankle Complete Right  Result Date: 06/15/2022 CLINICAL DATA:  Lateral right malar pain. Lump. Foot and ankle swelling for 4 months. EXAM: RIGHT ANKLE - COMPLETE 3+ VIEW; RIGHT FOOT COMPLETE - 3+ VIEW COMPARISON:  None Available. FINDINGS: Right ankle: The ankle mortise is symmetric and intact. There is curvilinear lucency separating the inferior approximate 6 mm of the medial malleolus from the rest of the medial malleolus. The borders are not definitely corticated, and this may be acute to subacute, however no adjacent medial malleolar soft tissue swelling is seen. Mild distal lateral malleolar degenerative spurring. Moderate plantar calcaneal heel spur. Mild posterior enthesopathic change at the Achilles insertion on the calcaneus. Mild distal anterior tibial plafond degenerative osteophytosis. -- Right foot: Minimal great toe metatarsophalangeal joint space narrowing. Mild second through fifth interphalangeal joint space narrowing. Likely old healed fracture of the distal shaft of the fifth metatarsal. Mild-to-moderate dorsal navicular-cuneiform and tarsometatarsal osteophytosis. No acute fracture or dislocation. IMPRESSION: 1. There is curvilinear lucency separating the inferior tip of the medial malleolus from the rest of the medial malleolus. Since  there is no adjacent soft tissue swelling, this is favored to be chronic. Nevertheless, the borders are not definitely corticated, and this could be acute to subacute. Recommend correlation with patient history and point tenderness. 2. Moderate plantar calcaneal heel spur. Electronically Signed   By: Yvonne Kendall M.D.   On: 06/15/2022 15:39   DG Foot Complete Right  Result Date: 06/15/2022 CLINICAL DATA:  Lateral right malar pain. Lump. Foot and ankle swelling for 4 months. EXAM: RIGHT ANKLE - COMPLETE 3+ VIEW; RIGHT FOOT COMPLETE - 3+ VIEW COMPARISON:  None Available. FINDINGS: Right ankle: The ankle mortise is symmetric and intact. There is curvilinear lucency separating the inferior approximate 6 mm of the medial malleolus from the rest of the medial malleolus. The borders are not definitely corticated, and this may be acute to subacute, however no adjacent medial malleolar soft tissue swelling is seen. Mild distal lateral malleolar degenerative spurring. Moderate plantar calcaneal heel spur. Mild posterior enthesopathic change at the Achilles insertion on the calcaneus. Mild distal anterior tibial plafond degenerative osteophytosis. -- Right foot: Minimal great toe metatarsophalangeal joint space narrowing. Mild second through fifth interphalangeal joint space narrowing. Likely old healed fracture of the distal shaft of the fifth metatarsal. Mild-to-moderate dorsal navicular-cuneiform and tarsometatarsal osteophytosis. No acute fracture or dislocation. IMPRESSION: 1. There is curvilinear lucency separating the inferior tip of the medial malleolus from the rest of the medial malleolus. Since there is no adjacent soft tissue swelling, this is favored to be chronic. Nevertheless, the borders are not definitely corticated, and this could be acute to subacute. Recommend correlation with patient history and point tenderness. 2. Moderate plantar calcaneal heel spur. Electronically Signed   By: Viann Fish.D.  On: 06/15/2022 15:39    I, Lynne Leader, personally (independently) visualized and performed the interpretation of the images attached in this note.   Impression and Recommendations:    Assessment and Plan: 66 y.o. female with right shoulder and right ankle pain.  Right shoulder pain thought to be due to subacromial impingement and bursitis/tendinitis.  Plan for subacromial injection.  Will recheck in 1 month.  Right ankle pain thought to be due to lateral ankle arthritis.  This is associated with ankle pain and swelling especially at the end of the day.  Plan for steroid injection and compression.  Recheck in 1 month.Marland Kitchen  PDMP not reviewed this encounter. Orders Placed This Encounter  Procedures   [place] LIMITED JOINT SPACE STRUCTURES UP RIGHT(NO LINKED CHARGES)    Order Specific Question:   Reason for Exam (SYMPTOM  OR DIAGNOSIS REQUIRED)    Answer:   right shoulder pain    Order Specific Question:   Preferred imaging location?    Answer:   [place]   No orders of the defined types were placed in this encounter.   Discussed warning signs or symptoms. Please see discharge instructions. Patient expresses understanding.   The above documentation has been reviewed and is accurate and complete Lynne Leader, M.D.

## 2022-06-17 ENCOUNTER — Ambulatory Visit: Payer: Self-pay

## 2022-06-17 ENCOUNTER — Ambulatory Visit (INDEPENDENT_AMBULATORY_CARE_PROVIDER_SITE_OTHER): Payer: Medicare HMO | Admitting: Family Medicine

## 2022-06-17 VITALS — BP 152/98 | HR 74 | Ht 61.0 in | Wt 145.0 lb

## 2022-06-17 DIAGNOSIS — M25571 Pain in right ankle and joints of right foot: Secondary | ICD-10-CM | POA: Diagnosis not present

## 2022-06-17 DIAGNOSIS — G8929 Other chronic pain: Secondary | ICD-10-CM | POA: Diagnosis not present

## 2022-06-17 DIAGNOSIS — M25511 Pain in right shoulder: Secondary | ICD-10-CM | POA: Diagnosis not present

## 2022-06-17 NOTE — Patient Instructions (Addendum)
Thank you for coming in today.   You received an injection today. Seek immediate medical attention if the joint becomes red, extremely painful, or is oozing fluid.   Please use Voltaren gel (Generic Diclofenac Gel) up to 4x daily for pain as needed.  This is available over-the-counter as both the name brand Voltaren gel and the generic diclofenac gel.   Check back in 1 month

## 2022-06-27 ENCOUNTER — Ambulatory Visit: Payer: Medicare HMO | Admitting: Internal Medicine

## 2022-07-09 ENCOUNTER — Other Ambulatory Visit: Payer: Self-pay | Admitting: Family Medicine

## 2022-07-14 ENCOUNTER — Encounter: Payer: Self-pay | Admitting: Family Medicine

## 2022-07-14 ENCOUNTER — Ambulatory Visit (INDEPENDENT_AMBULATORY_CARE_PROVIDER_SITE_OTHER): Payer: Medicare HMO | Admitting: Family Medicine

## 2022-07-14 VITALS — BP 120/80 | HR 78 | Temp 98.4°F | Ht 61.0 in | Wt 142.5 lb

## 2022-07-14 DIAGNOSIS — E538 Deficiency of other specified B group vitamins: Secondary | ICD-10-CM

## 2022-07-14 DIAGNOSIS — T783XXS Angioneurotic edema, sequela: Secondary | ICD-10-CM | POA: Diagnosis not present

## 2022-07-14 DIAGNOSIS — K130 Diseases of lips: Secondary | ICD-10-CM | POA: Diagnosis not present

## 2022-07-14 DIAGNOSIS — R1310 Dysphagia, unspecified: Secondary | ICD-10-CM

## 2022-07-14 MED ORDER — CYANOCOBALAMIN 1000 MCG/ML IJ SOLN
1000.0000 ug | Freq: Once | INTRAMUSCULAR | Status: AC
  Administered 2022-07-14: 1000 ug via INTRAMUSCULAR

## 2022-07-14 MED ORDER — ESOMEPRAZOLE MAGNESIUM 40 MG PO CPDR: 40.0000 mg | DELAYED_RELEASE_CAPSULE | Freq: Two times a day (BID) | ORAL | 1 refills | Status: DC

## 2022-07-14 NOTE — Progress Notes (Signed)
Subjective:     Patient ID: Tracey Saunders, female    DOB: [birth year], 67 y.o.   MRN: [id number]  Chief Complaint  Patient presents with   Referral    Discuss referral to ENT Li swelling, having trouble swallowing    HPI  Past 1 mo trouble swallowing. Food getting stuck. Hasn't seen GI  feels like "clog" of mucus in throat frequently and has cough-worse at hs when lying down.  Feels like mucus.  No hoarseness.  Liquids ok.   Taking omeprazole daily Mouth swelled few days ago and better, but top of mouth painful and area over lip hurts. No fever.   Health Maintenance Due  Topic Date Due   DTaP/Tdap/Td (1 - Tdap) Never done    Past Medical History:  Diagnosis Date   Anxiety disorder 09/09/2019   Cancer ([place])    cervical cancer   Depression    Diverticulitis    Fatty liver    GERD (gastroesophageal reflux disease)    Hyperlipidemia    Hypertension    Insomnia 09/09/2019   Liver cyst    Vitamin D deficiency     Past Surgical History:  Procedure Laterality Date   ABDOMINAL HYSTERECTOMY     COLON RESECTION     due to diverticulitis    Outpatient Medications Prior to Visit  Medication Sig Dispense Refill   AMLODIPINE BESYLATE PO Take 10 mg by mouth daily.     Artificial Saliva (BIOTENE DRY MOUTH MOISTURIZING) SOLN USe daily as needed for dry mouth 443 mL 0   atorvastatin (LIPITOR) 20 MG tablet TAKE 1 TABLET DAILY 90 tablet 1   azelastine (ASTELIN) 0.1 % nasal spray Place 2 sprays into both nostrils 2 (two) times daily. Use in each nostril as directed 30 mL 5   buPROPion (WELLBUTRIN XL) 150 MG 24 hr tablet Take 1 tablet by mouth once daily 30 tablet 0   diclofenac Sodium (VOLTAREN) 1 % GEL Apply 4 g topically 4 (four) times daily as needed. 100 g 3   fluticasone (FLONASE) 50 MCG/ACT nasal spray Place into both nostrils.     furosemide (LASIX) 20 MG tablet Take 1 tablet (20 mg total) by mouth 2 (two) times daily. 180 tablet 3   gabapentin (NEURONTIN) 300 MG capsule Take 2  capsules (600 mg total) by mouth 3 (three) times daily. 540 capsule 1   Hot/Cold Therapy Aids (HOT/COLD PACK) PADS Use as directed 3 each 0   Humidifiers MISC Use as needed to prevent dry mouth 1 each 0   levocetirizine (XYZAL) 5 MG tablet Take 5 mg by mouth 2 (two) times daily as needed.     potassium chloride SA (KLOR-CON M) 20 MEQ tablet Take 1 tablet (20 mEq total) by mouth 2 (two) times daily. 180 tablet 1   sertraline (ZOLOFT) 50 MG tablet Take 1.5 tablets (75 mg total) by mouth at bedtime. 135 tablet 3   tizanidine (ZANAFLEX) 2 MG capsule Take 1 capsule (2 mg total) by mouth 3 (three) times daily. 30 capsule 1   traZODone (DESYREL) 50 MG tablet TAKE 2 TABLETS AT BEDTIME  AS NEEDED FOR SLEEP 180 tablet 1   omeprazole (PRILOSEC) 40 MG capsule Take 1 tablet by mouth daily.     No facility-administered medications prior to visit.    Allergies  Allergen Reactions   Aleve-D Sinus & Cold [Pseudoephedrine-Naproxen Na Er]    Lisinopril Swelling   Losartan Swelling   ROS neg/noncontributory except as noted HPI/below  Objective:     BP 120/80   Pulse 78   Temp 98.4 F (36.9 C) (Temporal)   Ht '5\' 1"'$  (1.549 m)   Wt 142 lb 8 oz (64.6 kg)   SpO2 98%   BMI 26.93 kg/m  Wt Readings from Last 3 Encounters:  07/14/22 142 lb 8 oz (64.6 kg)  06/17/22 145 lb (65.8 kg)  06/08/22 148 lb (67.1 kg)    Physical Exam   Gen: WDWN NAD HEENT: NCAT, conjunctiva not injected, sclera nonicteric TM WNL L, wax R OP moist, no exudates  edentulous x 1 broken tooth L upper.  No mouth lesions seen.  Tender above upper lip. And lifting upper lip-no visible lesions.  NECK:  supple, no thyromegaly, no nodes, no carotid bruits CARDIAC: RRR, S1S2+, no murmur. LUNGS: CTAB. No wheezes ABDOMEN:  BS+, soft, NTND, No HSM, no masses EXT:  no edema MSK: no gross abnormalities.  NEURO: A&O x3.  CN II-XII intact.  PSYCH: normal mood. Good eye contact     Assessment & Plan:   Problem List Items  Addressed This Visit       Other   Vitamin B12 deficiency   Other Visit Diagnoses     Dysphagia, unspecified type    -  Primary   Relevant Orders   Ambulatory referral to Gastroenterology   Angioedema, sequela       Relevant Orders   Ambulatory referral to ENT   Pain, lip       Relevant Orders   Ambulatory referral to ENT      Dysphagia-change omeprazole to nexium '40mg'$  bid.  Urgent referral to GI Angioedema/lip pain-not sure why still getting angioedema.  Lip pain may be from the stretch or something else.  Refer ENT  Meds ordered this encounter  Medications   esomeprazole (NEXIUM) 40 MG capsule    Sig: Take 1 capsule (40 mg total) by mouth 2 (two) times daily before a meal.    Dispense:  180 capsule    Refill:  1   cyanocobalamin (VITAMIN B12) injection 1,000 mcg    Wellington Hampshire, MD

## 2022-07-14 NOTE — Patient Instructions (Signed)
Change the omeprazole to nexium twice/day.  Sending referral to GI  Sending referral to ENT for lip.

## 2022-07-19 ENCOUNTER — Ambulatory Visit: Payer: Medicare HMO | Admitting: Family Medicine

## 2022-07-19 DIAGNOSIS — Z0100 Encounter for examination of eyes and vision without abnormal findings: Secondary | ICD-10-CM | POA: Diagnosis not present

## 2022-07-19 DIAGNOSIS — H43393 Other vitreous opacities, bilateral: Secondary | ICD-10-CM | POA: Diagnosis not present

## 2022-07-19 DIAGNOSIS — H2513 Age-related nuclear cataract, bilateral: Secondary | ICD-10-CM | POA: Diagnosis not present

## 2022-07-21 ENCOUNTER — Ambulatory Visit: Payer: Medicare HMO | Admitting: Family Medicine

## 2022-07-21 VITALS — BP 138/88 | HR 81 | Ht 61.0 in | Wt 142.0 lb

## 2022-07-21 DIAGNOSIS — M25511 Pain in right shoulder: Secondary | ICD-10-CM

## 2022-07-21 DIAGNOSIS — G8929 Other chronic pain: Secondary | ICD-10-CM

## 2022-07-21 DIAGNOSIS — M25571 Pain in right ankle and joints of right foot: Secondary | ICD-10-CM | POA: Diagnosis not present

## 2022-07-21 NOTE — Patient Instructions (Signed)
Thank you for coming in today.   I've referred you to Physical Therapy.  Let us know if you don't hear from them in one week.   If physical therapy does not work we will do an MRI.   Keep me updated and recheck in 8 weeks.

## 2022-07-21 NOTE — Progress Notes (Signed)
   I, Peterson Lombard, LAT, ATC acting as a scribe for Lynne Leader, MD.  Tracey Saunders is a 67 y.o. female who presents to [facility] today for 1 month follow-up chronic right ankle and right shoulder pain.  Patient was last seen by Dr. Georgina Snell on 06/17/2022 and was given a right lateral ankle and right subacromial steroid injections.  Today, patient reports R ankle is feeling great. She has able to wear her high heel shoes. Pt c/o cont'd R shoulder pain w/ mechanical symptoms, that worsens throughout the day. Pt locates pain to the mid-upper arm. Pt got less than 1 month of relief from prior steroid injection.   Dx imaging: 06/14/22 R foot, R ankle, & R shoulder XR             09/17/21 C-spine XR  Pertinent review of systems: No fevers or chills  Relevant historical information: Hypertension   Exam:  BP 138/88   Pulse 81   Ht '5\' 1"'$  (1.549 m)   Wt 142 lb (64.4 kg)   SpO2 98%   BMI 26.83 kg/m  General: Well Developed, well nourished, and in no acute distress.   MSK: Right shoulder: Normal-appearing Nontender. Decreased range of motion limited abduction.  Pain with abduction. Normal external and internal rotation. Strength is also limited to abduction 4/5.Marland Kitchen Strength is intact external and internal rotation. Positive Hawkins and Neer's test. Positive empty can test. Negative Yergason's and speeds test.  Right ankle normal-appearing nontender normal motion.    Lab and Radiology Results EXAM: RIGHT SHOULDER - 2+ VIEW   COMPARISON:  None Available.   FINDINGS: There is diffuse decreased bone mineralization. Mild acromioclavicular joint space narrowing and peripheral osteophytosis. No acute fracture is seen. No dislocation. The visualized portion of the right lung is unremarkable.   IMPRESSION: Mild acromioclavicular osteoarthritis.     Electronically Signed   By: Yvonne Kendall M.D.   On: 06/15/2022 15:47 I, Lynne Leader, personally  (independently) visualized and performed the interpretation of the images attached in this note.     Assessment and Plan: 67 y.o. female with right shoulder pain thought to be due to rotator cuff tendinitis and impingement.  She had only about 2 weeks of benefit from subacromial injection in December.  It did work but did not last.  Will add physical therapy and recheck in about 8 weeks.  If not improved consider MRI for further evaluation including surgery or other injection planning.  The right ankle pain did improve with injection.  Pain is thought to be due to DJD.  Recheck in about 8 weeks.   PDMP not reviewed this encounter. Orders Placed This Encounter  Procedures   Ambulatory referral to Physical Therapy    Referral Priority:   Routine    Referral Type:   Physical Medicine    Referral Reason:   Specialty Services Required    Requested Specialty:   Physical Therapy    Number of Visits Requested:   1   No orders of the defined types were placed in this encounter.    Discussed warning signs or symptoms. Please see discharge instructions. Patient expresses understanding.   The above documentation has been reviewed and is accurate and complete Lynne Leader, M.D.

## 2022-07-22 ENCOUNTER — Encounter: Payer: Self-pay | Admitting: *Deleted

## 2022-07-25 ENCOUNTER — Encounter: Payer: Self-pay | Admitting: Physician Assistant

## 2022-07-25 ENCOUNTER — Ambulatory Visit: Payer: Medicare HMO | Admitting: Physician Assistant

## 2022-07-25 VITALS — BP 112/76 | HR 90 | Ht 61.0 in | Wt 142.0 lb

## 2022-07-25 DIAGNOSIS — R1310 Dysphagia, unspecified: Secondary | ICD-10-CM

## 2022-07-25 NOTE — Therapy (Incomplete)
OUTPATIENT PHYSICAL THERAPY SHOULDER EVALUATION   Patient Name: Tracey Saunders MRN: [id number] DOB:Dec 25, 1955, 67 y.o., female Today's Date: 07/25/2022  END OF SESSION:   Past Medical History:  Diagnosis Date   Anxiety disorder 09/09/2019   B12 deficiency    Cancer ([place])    cervical cancer   Depression    Diverticulitis    Fatty liver    GERD (gastroesophageal reflux disease)    Hyperlipidemia    Hypertension    Insomnia 09/09/2019   Liver cyst    Vitamin D deficiency    Past Surgical History:  Procedure Laterality Date   ABDOMINAL HYSTERECTOMY     COLON RESECTION     due to diverticulitis   Patient Active Problem List   Diagnosis Date Noted   Asthmatic bronchitis , chronic 12/15/2021   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  Rationale for Evaluation and Treatment: {HABREHAB:27488}  ONSET DATE: ***  SUBJECTIVE:                                                                                                                                                                                      SUBJECTIVE STATEMENT: ***  PERTINENT HISTORY: Vit B12 deficiency, Cervical CA, depression, anxiety disorder, diverticulitis, hyperlipidemia, GERD, vit D deficiency, HTN, abdominal hysterectomy, colon resection  PAIN:  NPRS scale: ***/10 Pain location: *** Pain description: *** Aggravating factors: *** Relieving factors:  ***  PRECAUTIONS: Other: h/o cervical cancer  WEIGHT BEARING RESTRICTIONS: No  FALLS:  Has patient fallen in last 6 months? No  LIVING ENVIRONMENT: Lives with: {OPRC lives with:25569::"lives with their family"} Lives in: {Lives in:25570} Stairs: {opstairs:27293} Has following equipment at home: {Assistive devices:23999}  OCCUPATION: ***  PLOF: Independent  PATIENT GOALS:***  Next MD visit:   OBJECTIVE:   DIAGNOSTIC FINDINGS:  ***  PATIENT SURVEYS:  07/26/22: FOTO intake:     %  COGNITION: Overall cognitive status: WFL     SENSATION: 07/26/22 {sensation:27233}  POSTURE: Rounded head and shoulder  UPPER EXTREMITY ROM:   A= active ROM P = passive ROM Right eval Left eval  Shoulder flexion A:  P:  A:  Shoulder extension A:  P:  A:   Shoulder abduction A:  P:  A:   Shoulder adduction    Shoulder internal rotation A:  P:  A:   Shoulder external rotation A:  P:  A:   Elbow flexion    Elbow extension    (Blank rows = not tested)  UPPER EXTREMITY MMT:  MMT Right eval Left eval  Shoulder flexion    Shoulder extension    Shoulder abduction    Shoulder adduction    Shoulder internal rotation Shoulder abd 45 deg Shoulder abd 45 deg  Shoulder external rotation    Middle trapezius    Lower trapezius    Elbow flexion    Elbow extension    Wrist flexion    Wrist extension    Wrist ulnar deviation    Wrist radial deviation    Wrist pronation    Wrist supination    Grip strength (lbs)    (Blank rows = not tested)   SHOULDER SPECIAL TESTS: Impingement tests: Neer impingement test: positive  and Hawkins/Kennedy impingement test: positive  Rotator cuff assessment: Empty can test: positive  Biceps assessment: Speed's test: negative    PALPATION:  ***   TODAY'S TREATMENT:                                                                                                                           DATE: Therex:    HEP instruction/performance c cues  for techniques, handout provided.  Trial set performed of each for comprehension and symptom assessment.  See below for exercise list   PATIENT EDUCATION: Education details: HEP, POC Person educated: Patient Education method: Explanation, Demonstration, Verbal cues, and Handouts Education comprehension: verbalized understanding, returned demonstration, and verbal cues required  HOME EXERCISE PROGRAM: ***  ASSESSMENT:  CLINICAL IMPRESSION: Patient is a 67 y.o. who comes to clinic with complaints of Rt shoulder pain with mobility, strength and movement coordination deficits that impair their ability to perform usual daily and recreational functional activities without increase difficulty/symptoms at this time.  pt presenting today with positive Empty Can, positive Neer's test with painful abduction and weakness noted.  Patient to benefit from skilled PT services to address impairments and limitations to improve to previous level of function without restriction secondary to condition.   OBJECTIVE IMPAIRMENTS: decreased mobility, decreased ROM, decreased strength, increased edema, impaired flexibility, impaired UE functional use, postural dysfunction, and pain.   ACTIVITY LIMITATIONS: carrying, lifting, dressing, and reach over head  PARTICIPATION LIMITATIONS: cleaning, laundry, driving, and community activity  PERSONAL FACTORS: 3+ comorbidities: see above pertinent history  are also affecting patient's functional outcome.   REHAB POTENTIAL: Good  CLINICAL DECISION MAKING: Stable/uncomplicated  EVALUATION COMPLEXITY: Low   GOALS: Goals reviewed with patient? Yes  SHORT TERM GOALS: (target date for Short term goals are 3 weeks 08/19/22)  1.Patient will demonstrate independent use of home exercise program to maintain progress from in clinic treatments. Goal status: New  LONG TERM GOALS: (target dates for all long term goals are 8 weeks  09/23/22 )   1. Patient will demonstrate/report  pain at worst less than or equal to 2/10 to facilitate minimal limitation in daily activity secondary to pain symptoms. Goal status: New   2. Patient will demonstrate independent use of home exercise program to facilitate ability to maintain/progress functional gains from skilled physical therapy services. Goal status: New   3. Patient will demonstrate FOTO outcome > or = *** % to indicate reduced disability due to condition. Goal status: New   4.  Patient will demonstrate Rt UE MMT 5/5 throughout to facilitate lifting, reaching, carrying at [facility] in daily activity.   Goal status: New   5.  Patient will demonstrate Rt GH joint AROM WFL s symptoms to facilitate usual overhead reaching, self care, dressing at PLOF.    Goal status: New   6.  Pt will be able to lift 5# from counter height to overhead shelf with only Rt UE with no pain reported using correct body mechanics.  Goal status: New   PLAN:  PT FREQUENCY: 1-2x/week  PT DURATION: 8 weeks  PLANNED INTERVENTIONS: Therapeutic exercises, Therapeutic activity, Neuro Muscular re-education, Balance training, Gait training, Patient/Family education, Joint mobilization, Stair training, DME instructions, Dry Needling, Electrical stimulation, Traction, Cryotherapy, vasopneumatic device Moist heat, Taping, Ultrasound, Ionotophoresis '4mg'$ /ml Dexamethasone, and Manual therapy.  All included unless contraindicated  PLAN FOR NEXT SESSION: Review HEP knowledge/results, shoulder ROM, begin gentle strengthening as tolerated, shoulder mobs, scapular mobility          Oretha Caprice, PT, MPT 07/25/2022, 3:43 PM

## 2022-07-25 NOTE — Progress Notes (Signed)
Assessment and plan noted ?

## 2022-07-25 NOTE — Patient Instructions (Signed)
You have been scheduled for an endoscopy. Please follow written instructions given to you at your visit today. If you use inhalers (even only as needed), please bring them with you on the day of your procedure.  Make sure to pick up your prescription of Nexium prescribed by your primary care physician. Take 40 mg twice daily before meals.  _______________________________________________________  If your blood pressure at your visit was 140/90 or greater, please contact your primary care physician to follow up on this.  _______________________________________________________  If you are age 68 or older, your body mass index should be between 23-30. Your Body mass index is 26.83 kg/m. If this is out of the aforementioned range listed, please consider follow up with your Primary Care Provider.  If you are age 40 or younger, your body mass index should be between 19-25. Your Body mass index is 26.83 kg/m. If this is out of the aformentioned range listed, please consider follow up with your Primary Care Provider.   ________________________________________________________  The [facility] providers would like to encourage you to use [facility] to communicate with providers for non-urgent requests or questions.  Due to long hold times on the telephone, sending your provider a message by [facility] may be a faster and more efficient way to get a response.  Please allow 48 business hours for a response.  Please remember that this is for non-urgent requests.  _______________________________________________________  Due to recent changes in healthcare laws, you may see the results of your imaging and laboratory studies on MyChart before your provider has had a chance to review them.  We understand that in some cases there may be results that are confusing or concerning to you. Not all laboratory results come back in the same time frame and the provider may be waiting for multiple results in order to interpret  others.  Please give [place] 48 hours in order for your provider to thoroughly review all the results before contacting the office for clarification of your results.

## 2022-07-25 NOTE — Progress Notes (Signed)
Subjective:    Patient ID: Tracey Saunders, female    DOB: 03-Mar-1956, 67 y.o.   MRN: [id number]  HPI  Tracey Saunders is a 67 year old white female, established with Dr. Henrene Pastor.  She was last seen in the office in December 2021. She has history of IBS, diverticulosis and is status post remote partial colectomy secondary to complications of diverticular disease, also with history of GERD. She comes in today with new complaint of dysphagia..  She had undergone EGD in May 2021 which was a normal exam and also had colonoscopy at that same time which showed a side-to-side anastomosis in the sigmoid colon and a 1 mm polyp was removed from the ascending colon which path showed to be polypoid colonic mucosa and therefore indicated for 10-year interval follow-up.  Since her current symptoms started a couple of months ago and have progressed.  She is sensing difficulty making a swallowing motion and a feeling of a knot sensation in her throat when she swallows.  She says she has been keeping water with her all of the time because she feels like she needs to drink water in order to aid the swallowing motion.  She is having more difficulty at nighttime.  She says when she lays down she feels like her saliva is not going down her throat and esophagus and she has to get up multiple times at night to drink some water which seems to help with the sensation of saliva pooling.  Whenever she is eating solid food she feels as if it is "sitting" in her throat/esophagus for a long time and points to her supraclavicular notch.  No complaints of odynophagia.  She was given a prescription for Nexium 40 mg twice daily recently by her PCP for the symptoms but has not picked it up yet as she says she does not drive. She has an appointment scheduled with ENT on February 13.  Review of Systems Pertinent positive and negative review of systems were noted in the above HPI section.  All other review of systems was otherwise negative.    Outpatient Encounter Medications as of 07/25/2022  Medication Sig   AMLODIPINE BESYLATE PO Take 10 mg by mouth daily.   Artificial Saliva (BIOTENE DRY MOUTH MOISTURIZING) SOLN USe daily as needed for dry mouth   atorvastatin (LIPITOR) 20 MG tablet TAKE 1 TABLET DAILY   azelastine (ASTELIN) 0.1 % nasal spray Place 2 sprays into both nostrils 2 (two) times daily. Use in each nostril as directed   buPROPion (WELLBUTRIN XL) 150 MG 24 hr tablet Take 1 tablet by mouth once daily   diclofenac Sodium (VOLTAREN) 1 % GEL Apply 4 g topically 4 (four) times daily as needed.   esomeprazole (NEXIUM) 40 MG capsule Take 1 capsule (40 mg total) by mouth 2 (two) times daily before a meal.   fluticasone (FLONASE) 50 MCG/ACT nasal spray Place into both nostrils.   furosemide (LASIX) 20 MG tablet Take 1 tablet (20 mg total) by mouth 2 (two) times daily.   gabapentin (NEURONTIN) 300 MG capsule Take 2 capsules (600 mg total) by mouth 3 (three) times daily.   Hot/Cold Therapy Aids (HOT/COLD PACK) PADS Use as directed   Humidifiers MISC Use as needed to prevent dry mouth   levocetirizine (XYZAL) 5 MG tablet Take 5 mg by mouth 2 (two) times daily as needed.   potassium chloride SA (KLOR-CON M) 20 MEQ tablet Take 1 tablet (20 mEq total) by mouth 2 (two) times daily.   sertraline (  ZOLOFT) 50 MG tablet Take 1.5 tablets (75 mg total) by mouth at bedtime.   tizanidine (ZANAFLEX) 2 MG capsule Take 1 capsule (2 mg total) by mouth 3 (three) times daily.   traZODone (DESYREL) 50 MG tablet TAKE 2 TABLETS AT BEDTIME  AS NEEDED FOR SLEEP   No facility-administered encounter medications on file as of 07/25/2022.   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[place])  03/12/2020   Headache, drug induced 03/12/2020   Hair thinning 11/12/2019   Recurrent major depressive disorder, in full remission ([place]) 11/04/2019   Gastroesophageal reflux disease 09/09/2019   Insomnia 09/09/2019   Anxiety disorder 09/09/2019   Essential hypertension 06/03/2019   Hyperlipidemia 06/03/2019   History of cancer 04/29/2019   Vitamin B12 deficiency 04/29/2019   Vitamin D deficiency 04/29/2019   Social History   Socioeconomic History   Marital status: Single    Spouse name: Not on file   Number of children: 1   Years of education: Not on file   Highest education level: Not on file  Occupational History   Occupation: retired   Occupation: retired  Tobacco Use   Smoking status: Light Smoker    Packs/day: 0.50    Types: Cigarettes   Smokeless tobacco: Never  Vaping Use   Vaping Use: Never used  Substance and Sexual Activity   Alcohol use: Not Currently    Comment: occasional beer or bloody mary once a week   Drug use: Never   Sexual activity: Not on file  Other Topics Concern   Not on file  Social History Narrative   Not on file   Social Determinants of Health   Financial Resource Strain: Low Risk  (03/24/2022)   Overall Financial Resource Strain (CARDIA)    Difficulty of Paying Living Expenses: Not very hard  Recent Concern: Emergency planning/management officer Strain - High Risk (02/16/2022)   Overall Financial Resource Strain (CARDIA)    Difficulty of Paying Living Expenses: Hard  Food Insecurity: No Food Insecurity (03/24/2022)   Hunger Vital Sign    Worried About Running Out of Food in the Last Year: Never true    [place] in the Last Year: Never true  Recent Concern: Food Insecurity - Food Insecurity Present (02/16/2022)   Hunger Vital Sign    Worried About Running Out of Food in the Last Year: Sometimes true    Ran Out of Food in the Last Year: Sometimes true  Transportation Needs: No Transportation Needs (03/24/2022)   PRAPARE - Radiographer, therapeutic (Medical): No    Lack of Transportation (Non-Medical): No  Physical Activity: Sufficiently Active (03/24/2022)   Exercise Vital Sign    Days of Exercise per Week: 5 days    Minutes of Exercise per Session: 30 min  Stress: Stress Concern Present (03/24/2022)   [place]    Feeling of Stress : To some extent  Social Connections: Socially Isolated (03/24/2022)   Social Connection and Isolation Panel [NHANES]    Frequency of Communication with Friends and Family: More than three times a week    Frequency of Social Gatherings with Friends and Family: Three times a week  Attends Religious Services: Never    Active Member of Clubs or Organizations: No    Attends Archivist Meetings: Never    Marital Status: Never married  Intimate Partner Violence: Not At Risk (03/24/2022)   Humiliation, Afraid, Rape, and Kick questionnaire    Fear of Current or Ex-Partner: No    Emotionally Abused: No    Physically Abused: No    Sexually Abused: No    Ms. Beauchamp's family history is not on file.      Objective:    Vitals:   07/25/22 1021  BP: 112/76  Pulse: 90    Physical Exam Well-developed well-nourished older white female in no acute distress.  Height, Weight, 142 BMI 26.8  HEENT; nontraumatic normocephalic, EOMI, PE R LA, sclera anicteric. Oropharynx; oropharynx not well-visualized Neck; supple, no JVD, no palpable mass or adenopathy Cardiovascular; regular rate and rhythm with S1-S2, no murmur rub or gallop Pulmonary; Clear bilaterally Abdomen; soft, nontender, nondistended, no palpable mass or hepatosplenomegaly, bowel sounds are active Rectal; not done today Skin; benign exam, no jaundice rash or appreciable lesions Extremities; no clubbing cyanosis or edema skin warm and dry Neuro/Psych; alert and oriented x4, grossly nonfocal mood and affect appropriate        Assessment & Plan:   #84  67 year old white female with 45-monthhistory of sensation of dysphagia to solids with a feeling of food "sitting" in her throat.  She also has a knot-like sensation in her throat/esophagus and frequent need to drink water to help with the knot-like feeling.  Nighttime symptoms with sensation of saliva not traversing the throat/esophagus when lying flat, getting up multiple times at night to drink water.  Symptoms are atypical for those normally seen with esophageal stricture, though cannot rule out. Rule out esophageal motility disorder, rule out neoplasm pharyngeal ,laryngeal ,esophageal. Rule out oral pharyngeal dysphagia.  #2 history of GERD 3.  Asthma 4.  Depression 5.  History of diverticular disease status post remote partial colectomy for complications of diverticular disease 6.  Colon cancer screening-up-to-date with last colonoscopy May 2021-no polyps follow-up 10 years  Plan; I discussed a barium swallow versus EGD.  Patient declines barium swallow as initial evaluation and wishes to proceed with EGD.  She will be scheduled for upper endoscopy with possible esophageal dilation with Dr. PHenrene Pastor  Procedure was discussed in detail with the patient including indications risk and benefits and she is agreeable to proceed.  She was advised to pick up the prescription for Nexium that her PCP sent and start failure to milligrams p.o. twice daily until EGD is done. She has appointment with ENT on February 13 and also encouraged to keep this appointment. Discussed elevation of the head of the bed 45 to 60 degrees for sleep Further recommendations pending EGD and ENT evaluation.   Cari Vandeberg SGenia HaroldPA-C 07/25/2022   Cc: KTawnya Crook MD

## 2022-07-26 ENCOUNTER — Ambulatory Visit: Payer: Medicare HMO | Admitting: Rehabilitative and Restorative Service Providers"

## 2022-07-26 ENCOUNTER — Encounter: Payer: Self-pay | Admitting: Rehabilitative and Restorative Service Providers"

## 2022-07-26 ENCOUNTER — Other Ambulatory Visit: Payer: Self-pay

## 2022-07-26 DIAGNOSIS — M25511 Pain in right shoulder: Secondary | ICD-10-CM

## 2022-07-26 DIAGNOSIS — M6281 Muscle weakness (generalized): Secondary | ICD-10-CM | POA: Diagnosis not present

## 2022-07-26 DIAGNOSIS — G8929 Other chronic pain: Secondary | ICD-10-CM

## 2022-07-26 DIAGNOSIS — R293 Abnormal posture: Secondary | ICD-10-CM | POA: Diagnosis not present

## 2022-07-26 NOTE — Therapy (Signed)
OUTPATIENT PHYSICAL THERAPY EVALUATION   Patient Name: Tracey Saunders MRN: [id number] DOB:1956-01-03, 67 y.o., female Today's Date: 07/26/2022  END OF SESSION:  PT End of Session - 07/26/22 1258     Visit Number 1    Number of Visits 20    Date for PT Re-Evaluation 10/04/22    Authorization Type Medicare $20 copay    Progress Note Due on Visit 10    PT Start Time 1305    PT Stop Time 1345    PT Time Calculation (min) 40 min    Activity Tolerance Patient limited by pain    Behavior During Therapy [facility] for tasks assessed/performed             Past Medical History:  Diagnosis Date   Anxiety disorder 09/09/2019   B12 deficiency    Cancer ([place])    cervical cancer   Depression    Diverticulitis    Fatty liver    GERD (gastroesophageal reflux disease)    Hyperlipidemia    Hypertension    Insomnia 09/09/2019   Liver cyst    Vitamin D deficiency    Past Surgical History:  Procedure Laterality Date   ABDOMINAL HYSTERECTOMY     COLON RESECTION     due to diverticulitis   Patient Active Problem List   Diagnosis Date Noted   Asthmatic bronchitis , chronic 12/15/2021   Chronic right-sided low back pain with sciatica 06/22/2020   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[place]) 03/12/2020   Headache, drug induced 03/12/2020   Hair thinning 11/12/2019   Recurrent major depressive disorder, in full remission ([place]) 11/04/2019   Gastroesophageal reflux disease 09/09/2019   Insomnia 09/09/2019   Anxiety disorder 09/09/2019   Essential hypertension 06/03/2019   Hyperlipidemia 06/03/2019   History of cancer 04/29/2019   Vitamin B12 deficiency 04/29/2019   Vitamin D deficiency 04/29/2019    PCP: Reginold Agent PROVIDER: [phone] (ICD-10-CM) - Chronic right shoulder pain  REFERRING DIAG: Gregor Hams, MD  THERAPY DIAG:  Chronic right shoulder pain  Muscle weakness (generalized)  Abnormal posture  Rationale for Evaluation and Treatment: Rehabilitation  ONSET DATE:  Approx. 6 months  SUBJECTIVE:                                                                                                                                                                                      SUBJECTIVE STATEMENT: Pt indicated having complaints of Rt shoulder pain.  She indicated the more she uses the arm and turning arm.  She indicated housework is very troublesome.  Insidious onset of symptoms. Pt indicated injection in shoulder didn't help it like it helped the shoulder.  She indicated sleeping on Rt side is troublesome but with medicine can get to sleep.   History of physical therapy in last year for neck complaints.   PERTINENT HISTORY: History Right lateral ankle and right subacromial steroid injections.  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   History of chronic pain in back/leg.   Med hx:  History of cervical cancer, GERD, Hyperlipidemia, HTN  PAIN:  NPRS scale: at worst 10/10 Pain location: Rt shoulder Pain description: catching sharp pain, constant pain Aggravating factors: lifting, reaching, housework activity Relieving factors: Medicine  PRECAUTIONS: None  WEIGHT BEARING RESTRICTIONS: No  FALLS:  Has patient fallen in last 6 months? No  LIVING ENVIRONMENT: Lives with: lives alone Lives in: House/apartment Stairs:7 stairs   OCCUPATION: No specific work  PLOF: Independent, housework activity, Rt hand dominant.  Read, puzzles, crochet/sewing/needle work ( limited due to pain)  PATIENT GOALS: No pain, get back to activity, avoid surgery   OBJECTIVE:   PATIENT SURVEYS:  07/26/2022 FOTO intake:  46   predicted:  63  COGNITION: 07/26/2022 Overall cognitive status: WFL     SENSATION: 07/26/2022 No specific testing today  POSTURE: 07/26/2022 Rounded shoulders, mild increase in thoracic kyphosis  UPPER EXTREMITY ROM:   ROM Right 07/26/2022 Measured in supine AROM Left 07/26/2022 [facility] AROM  Shoulder flexion 165   Shoulder extension    Shoulder abduction  150   Shoulder adduction    Shoulder internal rotation 75   Shoulder external rotation 80   Elbow flexion    Elbow extension    Wrist flexion    Wrist extension    Wrist ulnar deviation    Wrist radial deviation    Wrist pronation    Wrist supination    (Blank rows = not tested)  UPPER EXTREMITY MMT:  MMT Right 07/26/2022 Left 07/26/2022  Shoulder flexion 3+/5 c pain 4/5  Shoulder extension    Shoulder abduction 3+/5 c pain 4/5  Shoulder adduction    Shoulder internal rotation 4/5  4/5  Shoulder external rotation 4/5 c pain 4/5  Middle trapezius    Lower trapezius    Elbow flexion    Elbow extension    Wrist flexion    Wrist extension    Wrist ulnar deviation    Wrist radial deviation    Wrist pronation    Wrist supination    Grip strength (lbs)    (Blank rows = not tested)  SHOULDER SPECIAL TESTS: 07/26/2022 (+) Rt shoulder hawkins kennedy, neer, painful arc, ER lag testing, empty can.  (-) Shrug, drop arm Rt shoulder  JOINT MOBILITY TESTING:  07/26/2022 No limitation noted today  PALPATION:  07/26/2022 Tenderness superior/anterior Rt shoulder joint region.    TODAY'S TREATMENT:                                                                                                      DATE: 07/26/2022 Therex:    HEP instruction/performance c cues for techniques, handout provided.  Trial set performed of each for comprehension and symptom assessment.  See below for exercise list  Estim  IFC to tolerance Rt shoulder 6 mins for pain relief (stopped per Pt request due to not feeling like it felt good)   PATIENT EDUCATION: 07/26/2022 Education details: HEP, POC Person educated: Patient Education method: Consulting civil engineer, Demonstration, Verbal cues, and Handouts Education comprehension: verbalized understanding, returned demonstration, and verbal cues required  HOME EXERCISE PROGRAM: Access Code: [email or web address] URL: [email or web address] Date: 07/26/2022 Prepared  by: Scot Jun  Exercises - Seated Scapular Retraction  - 3-5 x daily - 7 x weekly - 1 sets - 10 reps - 3-5 hold - Standing Isometric Shoulder External Rotation with Doorway (Mirrored)  - 2-3 x daily - 7 x weekly - 1 sets - 10 reps - 5 hold  ASSESSMENT:  CLINICAL IMPRESSION: Patient is a 67 y.o. who comes to clinic with complaints of Rt shoulder pain with mobility, strength and movement coordination deficits that impair their ability to perform usual daily and recreational functional activities without increase difficulty/symptoms at this time.  Patient to benefit from skilled PT services to address impairments and limitations to improve to previous level of function without restriction secondary to condition.   Pt reported financial concerns about attending therapy visits.  Clinic will work to adapt to ability to schedule and attend.    OBJECTIVE IMPAIRMENTS: decreased activity tolerance, decreased coordination, decreased endurance, decreased mobility, difficulty walking, decreased ROM, decreased strength, increased fascial restrictions, impaired perceived functional ability, increased muscle spasms, impaired flexibility, impaired UE functional use, improper body mechanics, postural dysfunction, and pain.   ACTIVITY LIMITATIONS: carrying, lifting, sleeping, bathing, dressing, reach over head, and hygiene/grooming  PARTICIPATION LIMITATIONS: meal prep, cleaning, laundry, interpersonal relationship, community activity, and hobbies  PERSONAL FACTORS: Time since onset of injury/illness/exacerbation  Med hx:  History of cervical cancer, GERD, Hyperlipidemia, HTNare also affecting patient's functional outcome.   REHAB POTENTIAL: Fair to good  CLINICAL DECISION MAKING: Stable/uncomplicated  EVALUATION COMPLEXITY: Low   GOALS: Goals reviewed with patient? Yes  SHORT TERM GOALS: (target date for Short term goals are 3 weeks 08/16/2022)  1.Patient will demonstrate independent use of home  exercise program to maintain progress from in clinic treatments. Goal status: New  LONG TERM GOALS: (target dates for all long term goals are 10 weeks  10/04/2022 )   1. Patient will demonstrate/report pain at worst less than or equal to 2/10 to facilitate minimal limitation in daily activity secondary to pain symptoms. Goal status: New   2. Patient will demonstrate independent use of home exercise program to facilitate ability to maintain/progress functional gains from skilled physical therapy services. Goal status: New   3. Patient will demonstrate FOTO outcome > or = 63 % to indicate reduced disability due to condition. Goal status: New   4.  Patient will demonstrate Rt UE MMT 4/5 throughout to facilitate lifting, reaching, carrying at [facility] in daily activity.   Goal status: New   5.  Patient will demonstrate Rt GH joint AROM WFL s symptoms to facilitate usual overhead reaching, self care, dressing at PLOF.    Goal status: New   6.  Patient will demonstrate ability to perform AROM against gravity in all directions s symptoms to facilitate overhead reaching in daily life.  Goal status: New   7.  Patient will demonstrate/report ability to perform housework, hobbies at [facility] s limitation.  Goal Status: New  PLAN:  PT FREQUENCY: 1-2x/week (recommended 2x/week)  Pt indicated 2x/month may be what is able to afford  PT DURATION: 10 weeks  PLANNED INTERVENTIONS: Therapeutic exercises, Therapeutic activity, Neuro Muscular re-education,  Balance training, Gait training, Patient/Family education, Joint mobilization, Stair training, DME instructions, Dry Needling, Electrical stimulation, Traction, Cryotherapy, vasopneumatic device Moist heat, Taping, Ultrasound, Ionotophoresis '4mg'$ /ml Dexamethasone, and Manual therapy.  All included unless contraindicated  PLAN FOR NEXT SESSION: Review HEP knowledge/results.   Early active range improvements, Rt shoulder strengthening to tolerance.     Scot Jun, PT, DPT, OCS, ATC 07/26/22  1:58 PM

## 2022-08-03 ENCOUNTER — Telehealth: Payer: Self-pay

## 2022-08-03 NOTE — Telephone Encounter (Signed)
Noted  

## 2022-08-03 NOTE — Telephone Encounter (Signed)
Patient Advocate Encounter  Tier change request for Gabapentin  has been approved.    Effective dates: 07/11/22 through 07/11/23

## 2022-08-08 ENCOUNTER — Ambulatory Visit: Payer: Medicare HMO | Admitting: Rehabilitative and Restorative Service Providers"

## 2022-08-08 ENCOUNTER — Encounter: Payer: Self-pay | Admitting: Rehabilitative and Restorative Service Providers"

## 2022-08-08 ENCOUNTER — Other Ambulatory Visit: Payer: Self-pay | Admitting: Family Medicine

## 2022-08-08 DIAGNOSIS — M25511 Pain in right shoulder: Secondary | ICD-10-CM

## 2022-08-08 DIAGNOSIS — M6281 Muscle weakness (generalized): Secondary | ICD-10-CM | POA: Diagnosis not present

## 2022-08-08 DIAGNOSIS — G8929 Other chronic pain: Secondary | ICD-10-CM

## 2022-08-08 DIAGNOSIS — R293 Abnormal posture: Secondary | ICD-10-CM

## 2022-08-08 NOTE — Therapy (Addendum)
OUTPATIENT PHYSICAL THERAPY TREATMENT   Patient Name: Tracey Saunders MRN: [id number] DOB:11/14/1955, 67 y.o., female Today's Date: 08/08/2022  END OF SESSION:  PT End of Session - 08/08/22 0922     Visit Number 2    Number of Visits 20    Date for PT Re-Evaluation 10/04/22    Authorization Type Medicare $20 copay    Progress Note Due on Visit 10    PT Start Time 0925    PT Stop Time 0953    PT Time Calculation (min) 28 min    Activity Tolerance Patient limited by pain    Behavior During Therapy [facility] for tasks assessed/performed              Past Medical History:  Diagnosis Date   Anxiety disorder 09/09/2019   B12 deficiency    Cancer ([place])    cervical cancer   Depression    Diverticulitis    Fatty liver    GERD (gastroesophageal reflux disease)    Hyperlipidemia    Hypertension    Insomnia 09/09/2019   Liver cyst    Vitamin D deficiency    Past Surgical History:  Procedure Laterality Date   ABDOMINAL HYSTERECTOMY     COLON RESECTION     due to diverticulitis   Patient Active Problem List   Diagnosis Date Noted   Asthmatic bronchitis , chronic 12/15/2021   Chronic right-sided low back pain with sciatica 06/22/2020   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   Headache, drug induced 03/12/2020   Hair thinning 11/12/2019   Recurrent major depressive disorder, in full remission ([place]) 11/04/2019   Gastroesophageal reflux disease 09/09/2019   Insomnia 09/09/2019   Anxiety disorder 09/09/2019   Essential hypertension 06/03/2019   Hyperlipidemia 06/03/2019   History of cancer 04/29/2019   Vitamin B12 deficiency 04/29/2019   Vitamin D deficiency 04/29/2019    PCP: Reginold Agent PROVIDER: [phone] (ICD-10-CM) - Chronic right shoulder pain  REFERRING DIAG: Gregor Hams, MD  THERAPY DIAG:  Chronic right shoulder pain  Muscle weakness (generalized)  Abnormal posture  Rationale for Evaluation and Treatment: Rehabilitation  ONSET DATE:  Approx. 6 months  SUBJECTIVE:                                                                                                                                                                                      SUBJECTIVE STATEMENT: She indicated she has continued to have trouble with Rt shoulder/arm, even when not using it.  She mentioned she had to quit doing things at home due to pain complaints when using arm.  Arrived today with large quad cane in Rt arm for  stabilization with walking.   PERTINENT HISTORY: History Right lateral ankle and right subacromial steroid injections.  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   History of chronic pain in back/leg.   Med hx:  History of cervical cancer, GERD, Hyperlipidemia, HTN  PAIN:  NPRS scale: at worst 10/10 Pain location: Rt shoulder Pain description: catching sharp pain, constant pain Aggravating factors: lifting, reaching, housework activity Relieving factors: Medicine  PRECAUTIONS: None  WEIGHT BEARING RESTRICTIONS: No  FALLS:  Has patient fallen in last 6 months? No  LIVING ENVIRONMENT: Lives with: lives alone Lives in: House/apartment Stairs:7 stairs   OCCUPATION: No specific work  PLOF: Independent, housework activity, Rt hand dominant.  Read, puzzles, crochet/sewing/needle work ( limited due to pain)  PATIENT GOALS: No pain, get back to activity, avoid surgery   OBJECTIVE:   PATIENT SURVEYS:  07/26/2022 FOTO intake:  46   predicted:  63  COGNITION: 07/26/2022 Overall cognitive status: WFL     SENSATION: 07/26/2022 No specific testing today  POSTURE: 07/26/2022 Rounded shoulders, mild increase in thoracic kyphosis  UPPER EXTREMITY ROM:   ROM Right 07/26/2022 Measured in supine AROM Left 07/26/2022 WFL AROM Right 08/08/2022 Measured in supine  Shoulder flexion 165  95 c pain limited  125 PRM with pain limite  Shoulder extension     Shoulder abduction 150    Shoulder adduction     Shoulder internal rotation 75     Shoulder external rotation 80    Elbow flexion     Elbow extension     Wrist flexion     Wrist extension     Wrist ulnar deviation     Wrist radial deviation     Wrist pronation     Wrist supination     (Blank rows = not tested)  UPPER EXTREMITY MMT:  MMT Right 07/26/2022 Left 07/26/2022 Right 08/08/2022  Shoulder flexion 3+/5 c pain 4/5   Shoulder extension     Shoulder abduction 3+/5 c pain 4/5   Shoulder adduction     Shoulder internal rotation 4/5  4/5   Shoulder external rotation 4/5 c pain 4/5   Middle trapezius     Lower trapezius     Elbow flexion     Elbow extension     Wrist flexion     Wrist extension     Wrist ulnar deviation     Wrist radial deviation     Wrist pronation     Wrist supination     Grip strength (lbs)     (Blank rows = not tested)  SHOULDER SPECIAL TESTS: 07/26/2022 (+) Rt shoulder hawkins kennedy, neer, painful arc, ER lag testing, empty can.  (-) Shrug, drop arm Rt shoulder  JOINT MOBILITY TESTING:  07/26/2022 No limitation noted today  PALPATION:  07/26/2022 Tenderness superior/anterior Rt shoulder joint region.    TODAY'S TREATMENT:                                                                                                      DATE: 08/08/2022 Therex: Time spent in review of existing HEP  c verbal review for all Seated scapular retraction 5 sec hold x 10 Seated Rt shoulder ER isometrics against clinician resistance 5 sec hold x 10  Supine Rt arm flexion c Lt arm AAROM x 10 (limited by pain)      TODAY'S TREATMENT:                                                                                                      DATE: 07/26/2022 Therex:    HEP instruction/performance c cues for techniques, handout provided.  Trial set performed of each for comprehension and symptom assessment.  See below for exercise list  Estim IFC to tolerance Rt shoulder 6 mins for pain relief (stopped per Pt request due to not feeling like it felt  good)   PATIENT EDUCATION: 07/26/2022 Education details: HEP, POC Person educated: Patient Education method: Consulting civil engineer, Demonstration, Verbal cues, and Handouts Education comprehension: verbalized understanding, returned demonstration, and verbal cues required  HOME EXERCISE PROGRAM: Access Code: [email or web address] URL: [email or web address] Date: 07/26/2022 Prepared by: Scot Jun  Exercises - Seated Scapular Retraction  - 3-5 x daily - 7 x weekly - 1 sets - 10 reps - 3-5 hold - Standing Isometric Shoulder External Rotation with Doorway (Mirrored)  - 2-3 x daily - 7 x weekly - 1 sets - 10 reps - 5 hold  ASSESSMENT:  CLINICAL IMPRESSION: Strong pain complaints c continued difficulty with active movements and guarding.  Presentation still seems to be focused on contractile tissue impairments vs. Joint related troubles/tightness.  Limitation in treatment time due to pain complaints.     OBJECTIVE IMPAIRMENTS: decreased activity tolerance, decreased coordination, decreased endurance, decreased mobility, difficulty walking, decreased ROM, decreased strength, increased fascial restrictions, impaired perceived functional ability, increased muscle spasms, impaired flexibility, impaired UE functional use, improper body mechanics, postural dysfunction, and pain.   ACTIVITY LIMITATIONS: carrying, lifting, sleeping, bathing, dressing, reach over head, and hygiene/grooming  PARTICIPATION LIMITATIONS: meal prep, cleaning, laundry, interpersonal relationship, community activity, and hobbies  PERSONAL FACTORS: Time since onset of injury/illness/exacerbation  Med hx:  History of cervical cancer, GERD, Hyperlipidemia, HTNare also affecting patient's functional outcome.   REHAB POTENTIAL: Fair to good  CLINICAL DECISION MAKING: Stable/uncomplicated  EVALUATION COMPLEXITY: Low   GOALS: Goals reviewed with patient? Yes  SHORT TERM GOALS: (target date for Short term goals are 3  weeks 08/16/2022)  1.Patient will demonstrate independent use of home exercise program to maintain progress from in clinic treatments. Goal status: on going 08/08/2022  LONG TERM GOALS: (target dates for all long term goals are 10 weeks  10/04/2022 )   1. Patient will demonstrate/report pain at worst less than or equal to 2/10 to facilitate minimal limitation in daily activity secondary to pain symptoms. Goal status: New   2. Patient will demonstrate independent use of home exercise program to facilitate ability to maintain/progress functional gains from skilled physical therapy services. Goal status: New   3. Patient will demonstrate FOTO outcome > or = 63 % to indicate reduced disability due to condition. Goal status: New   4.  Patient will demonstrate Rt  UE MMT 4/5 throughout to facilitate lifting, reaching, carrying at [facility] in daily activity.   Goal status: New   5.  Patient will demonstrate Rt GH joint AROM WFL s symptoms to facilitate usual overhead reaching, self care, dressing at PLOF.    Goal status: New   6.  Patient will demonstrate ability to perform AROM against gravity in all directions s symptoms to facilitate overhead reaching in daily life.  Goal status: New   7.  Patient will demonstrate/report ability to perform housework, hobbies at [facility] s limitation.  Goal Status: New  PLAN:  PT FREQUENCY: 1-2x/week (recommended 2x/week)  Pt indicated 2x/month may be what is able to afford Pt reported financial concerns about attending therapy visits.  Clinic will work to adapt to ability to schedule and attend.   PT DURATION: 10 weeks    PLANNED INTERVENTIONS: Therapeutic exercises, Therapeutic activity, Neuro Muscular re-education, Balance training, Gait training, Patient/Family education, Joint mobilization, Stair training, DME instructions, Dry Needling, Electrical stimulation, Traction, Cryotherapy, vasopneumatic device Moist heat, Taping, Ultrasound, Ionotophoresis '4mg'$ /ml  Dexamethasone, and Manual therapy.  All included unless contraindicated  PLAN FOR NEXT SESSION: Recheck HEP.  Monitor symptoms.    Scot Jun, PT, DPT, OCS, ATC 08/08/22  10:01 AM

## 2022-08-11 ENCOUNTER — Ambulatory Visit (INDEPENDENT_AMBULATORY_CARE_PROVIDER_SITE_OTHER): Payer: Medicare HMO | Admitting: Family Medicine

## 2022-08-11 ENCOUNTER — Encounter: Payer: Self-pay | Admitting: Family Medicine

## 2022-08-11 VITALS — BP 140/80 | HR 82 | Temp 98.0°F | Resp 16 | Ht 61.0 in | Wt 140.0 lb

## 2022-08-11 DIAGNOSIS — R519 Headache, unspecified: Secondary | ICD-10-CM

## 2022-08-11 DIAGNOSIS — E538 Deficiency of other specified B group vitamins: Secondary | ICD-10-CM

## 2022-08-11 DIAGNOSIS — E782 Mixed hyperlipidemia: Secondary | ICD-10-CM | POA: Diagnosis not present

## 2022-08-11 DIAGNOSIS — F3342 Major depressive disorder, recurrent, in full remission: Secondary | ICD-10-CM

## 2022-08-11 DIAGNOSIS — I10 Essential (primary) hypertension: Secondary | ICD-10-CM | POA: Diagnosis not present

## 2022-08-11 DIAGNOSIS — R69 Illness, unspecified: Secondary | ICD-10-CM | POA: Diagnosis not present

## 2022-08-11 DIAGNOSIS — E559 Vitamin D deficiency, unspecified: Secondary | ICD-10-CM | POA: Diagnosis not present

## 2022-08-11 DIAGNOSIS — T07XXXA Unspecified multiple injuries, initial encounter: Secondary | ICD-10-CM

## 2022-08-11 LAB — COMPREHENSIVE METABOLIC PANEL
ALT: 9 U/L (ref 0–35)
AST: 12 U/L (ref 0–37)
Albumin: 4.4 g/dL (ref 3.5–5.2)
Alkaline Phosphatase: 74 U/L (ref 39–117)
BUN: 17 mg/dL (ref 6–23)
CO2: 28 mEq/L (ref 19–32)
Calcium: 9.2 mg/dL (ref 8.4–10.5)
Chloride: 101 mEq/L (ref 96–112)
Creatinine, Ser: 0.91 mg/dL (ref 0.40–1.20)
GFR: 64.96 mL/min (ref >60.00)
Glucose, Bld: 97 mg/dL (ref 70–99)
Potassium: 3.9 mEq/L (ref 3.5–5.1)
Sodium: 139 mEq/L (ref 135–145)
Total Bilirubin: 0.4 mg/dL (ref 0.2–1.2)
Total Protein: 6.5 g/dL (ref 6.0–8.3)

## 2022-08-11 LAB — TSH: TSH: 3.03 u[IU]/mL (ref 0.35–5.50)

## 2022-08-11 LAB — LIPID PANEL
Cholesterol: 276 mg/dL — ABNORMAL HIGH (ref 0–200)
HDL: 60.2 mg/dL (ref >39.00)
LDL Cholesterol: 198 mg/dL — ABNORMAL HIGH (ref 0–99)
NonHDL: 216.2
Total CHOL/HDL Ratio: 5
Triglycerides: 93 mg/dL (ref 0.0–149.0)
VLDL: 18.6 mg/dL (ref 0.0–40.0)

## 2022-08-11 LAB — CBC WITH DIFFERENTIAL/PLATELET
Basophils Absolute: 0.1 10*3/uL (ref 0.0–0.1)
Basophils Relative: 0.7 % (ref 0.0–3.0)
Eosinophils Absolute: 0.1 10*3/uL (ref 0.0–0.7)
Eosinophils Relative: 1 % (ref 0.0–5.0)
HCT: 36.8 % (ref 36.0–46.0)
Hemoglobin: 12.8 g/dL (ref 12.0–15.0)
Lymphocytes Relative: 19.3 % (ref 12.0–46.0)
Lymphs Abs: 1.5 10*3/uL (ref 0.7–4.0)
MCHC: 34.7 g/dL (ref 30.0–36.0)
MCV: 93.3 fl (ref 78.0–100.0)
Monocytes Absolute: 0.6 10*3/uL (ref 0.1–1.0)
Monocytes Relative: 7.1 % (ref 3.0–12.0)
Neutro Abs: 5.8 10*3/uL (ref 1.4–7.7)
Neutrophils Relative %: 71.9 % (ref 43.0–77.0)
Platelets: 286 10*3/uL (ref 150.0–400.0)
RBC: 3.95 Mil/uL (ref 3.87–5.11)
RDW: 13.4 % (ref 11.5–15.5)
WBC: 8 10*3/uL (ref 4.0–10.5)

## 2022-08-11 LAB — VITAMIN D 25 HYDROXY (VIT D DEFICIENCY, FRACTURES): VITD: 29.36 ng/mL — ABNORMAL LOW (ref 30.00–100.00)

## 2022-08-11 MED ORDER — POTASSIUM CHLORIDE CRYS ER 20 MEQ PO TBCR: 20.0000 meq | EXTENDED_RELEASE_TABLET | Freq: Two times a day (BID) | ORAL | 1 refills | Status: DC

## 2022-08-11 MED ORDER — KETOROLAC TROMETHAMINE 60 MG/2ML IM SOLN
60.0000 mg | Freq: Once | INTRAMUSCULAR | Status: AC
  Administered 2022-08-11: 60 mg via INTRAMUSCULAR

## 2022-08-11 MED ORDER — BUPROPION HCL ER (XL) 150 MG PO TB24: 150.0000 mg | ORAL_TABLET | Freq: Every day | ORAL | 1 refills | Status: DC

## 2022-08-11 MED ORDER — CYANOCOBALAMIN 1000 MCG/ML IJ SOLN
1000.0000 ug | Freq: Once | INTRAMUSCULAR | Status: AC
  Administered 2022-08-11: 1000 ug via INTRAMUSCULAR

## 2022-08-11 MED ORDER — KETOROLAC TROMETHAMINE 60 MG/2ML IM SOLN: 60.0000 mg | Freq: Once | INTRAMUSCULAR | Status: DC

## 2022-08-11 NOTE — Progress Notes (Signed)
Subjective:     Patient ID: Tracey Saunders, female    DOB: [birth year], 67 y.o.   MRN: [id number]  Chief Complaint  Patient presents with   Follow-up    Follow up    HPI  HA - chronic, but worse past 3 days.  Bad and bp up. Goes to bed w/HA and wakes w/HA. No dbl vision. Got new glasses. No new nausea(chronic).  HTN-elevated past 3 days but bad HA. Bp was fine last wk. On amlodipine '5mg'$ . B12 def-got injection today. Bruises on arms more lately. No abuse. No ibu etc.  Depression-on wellbutrin 150-helps. No SI Vit d def-not taking anythng HLD-on lipitor 20-due labs  Health Maintenance Due  Topic Date Due   DTaP/Tdap/Td (1 - Tdap) Never done    Past Medical History:  Diagnosis Date   Anxiety disorder 09/09/2019   B12 deficiency    Cancer ([place])    cervical cancer   Depression    Diverticulitis    Fatty liver    GERD (gastroesophageal reflux disease)    Hyperlipidemia    Hypertension    Insomnia 09/09/2019   Liver cyst    Vitamin D deficiency     Past Surgical History:  Procedure Laterality Date   ABDOMINAL HYSTERECTOMY     COLON RESECTION     due to diverticulitis    Outpatient Medications Prior to Visit  Medication Sig Dispense Refill   AMLODIPINE BESYLATE PO Take 10 mg by mouth daily.     Artificial Saliva (BIOTENE DRY MOUTH MOISTURIZING) SOLN USe daily as needed for dry mouth 443 mL 0   atorvastatin (LIPITOR) 20 MG tablet TAKE 1 TABLET DAILY 90 tablet 1   azelastine (ASTELIN) 0.1 % nasal spray Place 2 sprays into both nostrils 2 (two) times daily. Use in each nostril as directed 30 mL 5   diclofenac Sodium (VOLTAREN) 1 % GEL Apply 4 g topically 4 (four) times daily as needed. 100 g 3   esomeprazole (NEXIUM) 40 MG capsule Take 1 capsule (40 mg total) by mouth 2 (two) times daily before a meal. 180 capsule 1   fluticasone (FLONASE) 50 MCG/ACT nasal spray Place into both nostrils.     furosemide (LASIX) 20 MG tablet Take 1 tablet (20 mg total) by mouth 2 (two)  times daily. 180 tablet 3   gabapentin (NEURONTIN) 300 MG capsule TAKE 2 CAPSULES('600MG'$       TOTAL) 3 TIMES A DAY 540 capsule 1   Hot/Cold Therapy Aids (HOT/COLD PACK) PADS Use as directed 3 each 0   Humidifiers MISC Use as needed to prevent dry mouth 1 each 0   levocetirizine (XYZAL) 5 MG tablet Take 5 mg by mouth 2 (two) times daily as needed.     sertraline (ZOLOFT) 50 MG tablet Take 1.5 tablets (75 mg total) by mouth at bedtime. 135 tablet 3   tizanidine (ZANAFLEX) 2 MG capsule Take 1 capsule (2 mg total) by mouth 3 (three) times daily. 30 capsule 1   traZODone (DESYREL) 50 MG tablet TAKE 2 TABLETS AT BEDTIME  AS NEEDED FOR SLEEP 180 tablet 1   buPROPion (WELLBUTRIN XL) 150 MG 24 hr tablet Take 1 tablet by mouth once daily 30 tablet 0   potassium chloride SA (KLOR-CON M) 20 MEQ tablet Take 1 tablet (20 mEq total) by mouth 2 (two) times daily. 180 tablet 1   No facility-administered medications prior to visit.    Allergies  Allergen Reactions   Aleve-D Sinus & Cold [Pseudoephedrine-Naproxen Na  Er]    Lisinopril Swelling   Losartan Swelling   ROS neg/noncontributory except as noted HPI/below      Objective:     BP (!) 140/80 (BP Location: Left Arm, Patient Position: Sitting, Cuff Size: Normal)   Pulse 82   Temp 98 F (36.7 C) (Oral)   Resp 16   Ht '5\' 1"'$  (1.549 m)   Wt 140 lb (63.5 kg)   SpO2 97%   BMI 26.45 kg/m  Wt Readings from Last 3 Encounters:  08/11/22 140 lb (63.5 kg)  07/25/22 142 lb (64.4 kg)  07/21/22 142 lb (64.4 kg)    Physical Exam   Gen: WDWN NAD HEENT: NCAT, conjunctiva not injected, sclera nonicteric NECK:  supple, no thyromegaly, no nodes, no carotid bruits CARDIAC: RRR, S1S2+, no murmur. LUNGS: CTAB. No wheezes ABDOMEN:  BS+, soft, NTND, No HSM, no masses MSK: cane.  Some bruises on forearms  NEURO: A&O x3.  CN II-XII intact.  PSYCH: normal mood. Good eye contact     Assessment & Plan:   Problem List Items Addressed This Visit        Cardiovascular and Mediastinum   Essential hypertension - Primary   Relevant Orders   Comprehensive metabolic panel (Completed)   CBC with Differential/Platelet (Completed)   TSH (Completed)     Other   Hyperlipidemia   Relevant Orders   Comprehensive metabolic panel (Completed)   Lipid panel (Completed)   Vitamin D deficiency   Relevant Orders   VITAMIN D 25 Hydroxy (Vit-D Deficiency, Fractures) (Completed)   Recurrent major depressive disorder, in full remission (HCC)   Relevant Medications   buPROPion (WELLBUTRIN XL) 150 MG 24 hr tablet   Other Visit Diagnoses     B12 deficiency       Relevant Medications   cyanocobalamin (VITAMIN B12) injection 1,000 mcg (Completed)   Intractable episodic headache, unspecified headache type       Relevant Medications   buPROPion (WELLBUTRIN XL) 150 MG 24 hr tablet   ketorolac (TORADOL) injection 60 mg (Completed)   Other Relevant Orders   CBC with Differential/Platelet (Completed)   Multiple bruises         1.  Hypertension-chronic.  Usually well-controlled with amlodipine 5 mg daily.  Suspect elevated today due to 3-day history of headaches.  Check CBC, CMP, TSH 2.  Hyperlipidemia-chronic.  Controlled on Lipitor 20 mg daily.  Continue.  Check CMP, lipids 3.  Headache-she gets intermittently.  This ones been going on for 3 days and is severe.  Will do Toradol 60 mg injection.  Hopefully this is the third day of a migraine.  Monitor frequencies.  Let us know if not improving. 4.  Vitamin B12 deficiency-chronic.  Controlled.  Continue B12 injections monthly.  Given today. 5.  Vitamin D deficiency-not on supplementation.  Check vitamin D levels 6.  Bruising-no specific etiology.  Just on forearms.  Check CBC 7.  Depression-chronic.  Some better on sertraline 75 mg daily and Wellbutrin 150 mg XR daily.  Refilled.  Meds ordered this encounter  Medications   cyanocobalamin (VITAMIN B12) injection 1,000 mcg   DISCONTD: ketorolac (TORADOL)  injection 60 mg   buPROPion (WELLBUTRIN XL) 150 MG 24 hr tablet    Sig: Take 1 tablet (150 mg total) by mouth daily.    Dispense:  90 tablet    Refill:  1   potassium chloride SA (KLOR-CON M) 20 MEQ tablet    Sig: Take 1 tablet (20 mEq total) by mouth  2 (two) times daily.    Dispense:  180 tablet    Refill:  1   ketorolac (TORADOL) injection 60 mg    Wellington Hampshire, MD

## 2022-08-11 NOTE — Progress Notes (Signed)
1.  Cholesterol is terrible-did she stop taking her atorvastatin?  If not, increase to 40 mg daily. 2.  Vitamin D could be a little better-suggest getting 1000 IUs/day over-the-counter. Rest the labs look good

## 2022-08-11 NOTE — Patient Instructions (Signed)
It was very nice to see you today!  Hopefully headache improves after inection.  Keep records   PLEASE NOTE:  If you had any lab tests please let us know if you have not heard back within a few days. You may see your results on MyChart before we have a chance to review them but we will give you a call once they are reviewed by [place]. If we ordered any referrals today, please let us know if you have not heard from their office within the next week.   Please try these tips to maintain a healthy lifestyle:  Eat most of your calories during the day when you are active. Eliminate processed foods including packaged sweets (pies, cakes, cookies), reduce intake of potatoes, white bread, white pasta, and white rice. Look for whole grain options, oat flour or almond flour.  Each meal should contain half fruits/vegetables, one quarter protein, and one quarter carbs (no bigger than a computer mouse).  Cut down on sweet beverages. This includes juice, soda, and sweet tea. Also watch fruit intake, though this is a healthier sweet option, it still contains natural sugar! Limit to 3 servings daily.  Drink at least 1 glass of water with each meal and aim for at least 8 glasses per day  Exercise at least 150 minutes every week.

## 2022-08-12 ENCOUNTER — Telehealth: Payer: Self-pay | Admitting: Family Medicine

## 2022-08-12 NOTE — Telephone Encounter (Signed)
Pt states the migraine shot that was given to her worked perfect and also, she wanted to know if she ws going to be RX some headache meds. Please advise.

## 2022-08-14 ENCOUNTER — Encounter: Payer: Self-pay | Admitting: Family Medicine

## 2022-08-16 ENCOUNTER — Telehealth: Payer: Self-pay | Admitting: Family Medicine

## 2022-08-16 ENCOUNTER — Ambulatory Visit (AMBULATORY_SURGERY_CENTER): Payer: Medicare HMO | Admitting: Internal Medicine

## 2022-08-16 ENCOUNTER — Encounter: Payer: Self-pay | Admitting: Internal Medicine

## 2022-08-16 VITALS — BP 128/57 | HR 75 | Temp 97.3°F | Resp 12 | Ht 61.0 in | Wt 142.0 lb

## 2022-08-16 DIAGNOSIS — R1310 Dysphagia, unspecified: Secondary | ICD-10-CM | POA: Diagnosis not present

## 2022-08-16 DIAGNOSIS — K219 Gastro-esophageal reflux disease without esophagitis: Secondary | ICD-10-CM | POA: Diagnosis not present

## 2022-08-16 DIAGNOSIS — K449 Diaphragmatic hernia without obstruction or gangrene: Secondary | ICD-10-CM | POA: Diagnosis not present

## 2022-08-16 MED ORDER — SODIUM CHLORIDE 0.9 % IV SOLN: 500.0000 mL | INTRAVENOUS | Status: DC

## 2022-08-16 NOTE — Op Note (Signed)
[place] Patient Name: Tracey Saunders Procedure Date: 08/16/2022 10:50 AM MRN: [id number] Endoscopist: Docia Chuck. Henrene Pastor , MD, [id number] Age: 67 Referring MD:  Date of Birth: 1956-06-22 Gender: Female Account #: [id number] Procedure:                Upper GI endoscopy with [facility] dilation of the                            esophagus. 52 [address] Indications:              Dysphagia, Esophageal reflux Medicines:                Monitored Anesthesia Care Procedure:                Pre-Anesthesia Assessment:                           - Prior to the procedure, a History and Physical                            was performed, and patient medications and                            allergies were reviewed. The patient's tolerance of                            previous anesthesia was also reviewed. The risks                            and benefits of the procedure and the sedation                            options and risks were discussed with the patient.                            All questions were answered, and informed consent                            was obtained. Prior Anticoagulants: The patient has                            taken no anticoagulant or antiplatelet agents. ASA                            Grade Assessment: II - A patient with mild systemic                            disease. After reviewing the risks and benefits,                            the patient was deemed in satisfactory condition to                            undergo the procedure.  After obtaining informed consent, the endoscope was                            passed under direct vision. Throughout the                            procedure, the patient's blood pressure, pulse, and                            oxygen saturations were monitored continuously. The                            Olympus Scope [id number] was introduced through the                            mouth, and advanced to  the second part of duodenum.                            The upper GI endoscopy was accomplished without                            difficulty. The patient tolerated the procedure                            well. Scope In: Scope Out: Findings:                 The esophagus was a bit tortuous but otherwise                            normal. No obvious stricture. The scope was                            withdrawn. Dilation was performed with a Maloney                            dilator with no resistance at 61 Fr.                           The stomach was normal, save hiatal hernia.                           The examined duodenum was normal.                           The cardia and gastric fundus were normal on                            retroflexion. Complications:            No immediate complications. Estimated Blood Loss:     Estimated blood loss: none. Impression:               - Normal esophagus. Dilated.                           -  Normal stomach. Sliding hiatal hernia.                           - Normal examined duodenum.                           - No specimens collected. Recommendation:           - Patient has a contact number available for                            emergencies. The signs and symptoms of potential                            delayed complications were discussed with the                            patient. Return to normal activities tomorrow.                            Written discharge instructions were provided to the                            patient.                           - Post dilation diet.                           - Continue present medications.                           - Keep your ENT appointment                           - If dysphagia persist despite dilation, consider                            esophagram with tablet to rule out extrinsic                            compression or other process.                           - Office follow-up  with Dr. Henrene Pastor in 4 to 6 weeks Docia Chuck. Henrene Pastor, MD 08/16/2022 11:09:25 AM This report has been signed electronically.

## 2022-08-16 NOTE — Progress Notes (Signed)
Pt's states no medical or surgical changes since previsit or office visit. 

## 2022-08-16 NOTE — Progress Notes (Signed)
Expand All Collapse All    Subjective:      Subjective  Patient ID: Tracey Saunders, female    DOB: Jan 02, 1956, 67 y.o.   MRN: [id number]   HPI  Tracey Saunders is a 67 year old white female, established with Dr. Henrene Pastor.  She was last seen in the office in December 2021. She has history of IBS, diverticulosis and is status post remote partial colectomy secondary to complications of diverticular disease, also with history of GERD. She comes in today with new complaint of dysphagia..   She had undergone EGD in May 2021 which was a normal exam and also had colonoscopy at that same time which showed a side-to-side anastomosis in the sigmoid colon and a 1 mm polyp was removed from the ascending colon which path showed to be polypoid colonic mucosa and therefore indicated for 10-year interval follow-up.   Since her current symptoms started a couple of months ago and have progressed.  She is sensing difficulty making a swallowing motion and a feeling of a knot sensation in her throat when she swallows.  She says she has been keeping water with her all of the time because she feels like she needs to drink water in order to aid the swallowing motion.  She is having more difficulty at nighttime.  She says when she lays down she feels like her saliva is not going down her throat and esophagus and she has to get up multiple times at night to drink some water which seems to help with the sensation of saliva pooling.  Whenever she is eating solid food she feels as if it is "sitting" in her throat/esophagus for a long time and points to her supraclavicular notch.  No complaints of odynophagia.  She was given a prescription for Nexium 40 mg twice daily recently by her PCP for the symptoms but has not picked it up yet as she says she does not drive. She has an appointment scheduled with ENT on February 13.   Review of Systems Pertinent positive and negative review of systems were noted in the above HPI section.  All other review  of systems was otherwise negative.        Outpatient Encounter Medications as of 07/25/2022  Medication Sig   AMLODIPINE BESYLATE PO Take 10 mg by mouth daily.   Artificial Saliva (BIOTENE DRY MOUTH MOISTURIZING) SOLN USe daily as needed for dry mouth   atorvastatin (LIPITOR) 20 MG tablet TAKE 1 TABLET DAILY   azelastine (ASTELIN) 0.1 % nasal spray Place 2 sprays into both nostrils 2 (two) times daily. Use in each nostril as directed   buPROPion (WELLBUTRIN XL) 150 MG 24 hr tablet Take 1 tablet by mouth once daily   diclofenac Sodium (VOLTAREN) 1 % GEL Apply 4 g topically 4 (four) times daily as needed.   esomeprazole (NEXIUM) 40 MG capsule Take 1 capsule (40 mg total) by mouth 2 (two) times daily before a meal.   fluticasone (FLONASE) 50 MCG/ACT nasal spray Place into both nostrils.   furosemide (LASIX) 20 MG tablet Take 1 tablet (20 mg total) by mouth 2 (two) times daily.   gabapentin (NEURONTIN) 300 MG capsule Take 2 capsules (600 mg total) by mouth 3 (three) times daily.   Hot/Cold Therapy Aids (HOT/COLD PACK) PADS Use as directed   Humidifiers MISC Use as needed to prevent dry mouth   levocetirizine (XYZAL) 5 MG tablet Take 5 mg by mouth 2 (two) times daily as needed.   potassium chloride SA (KLOR-CON  M) 20 MEQ tablet Take 1 tablet (20 mEq total) by mouth 2 (two) times daily.   sertraline (ZOLOFT) 50 MG tablet Take 1.5 tablets (75 mg total) by mouth at bedtime.   tizanidine (ZANAFLEX) 2 MG capsule Take 1 capsule (2 mg total) by mouth 3 (three) times daily.   traZODone (DESYREL) 50 MG tablet TAKE 2 TABLETS AT BEDTIME  AS NEEDED FOR SLEEP    No facility-administered encounter medications on file as of 07/25/2022.        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   Headache, drug induced 03/12/2020   Hair thinning 11/12/2019   Recurrent major depressive disorder, in full remission ([place]) 11/04/2019   Gastroesophageal reflux disease 09/09/2019   Insomnia 09/09/2019   Anxiety disorder 09/09/2019   Essential hypertension 06/03/2019   Hyperlipidemia 06/03/2019   History of cancer 04/29/2019   Vitamin B12 deficiency 04/29/2019   Vitamin D deficiency 04/29/2019    Social History         Socioeconomic History   Marital status: Single      Spouse name: Not on file   Number of children: 1   Years of education: Not on file   Highest education level: Not on file  Occupational History   Occupation: retired   Occupation: retired  Tobacco Use   Smoking status: Light Smoker      Packs/day: 0.50      Types: Cigarettes   Smokeless tobacco: Never  Vaping Use   Vaping Use: Never used  Substance and Sexual Activity   Alcohol use: Not Currently      Comment: occasional beer or bloody mary once a week   Drug use: Never   Sexual activity: Not on file  Other Topics Concern   Not on file  Social History Narrative   Not on file    Social Determinants of Health        Financial Resource Strain: Low Risk  (03/24/2022)    Overall Financial Resource Strain (CARDIA)     Difficulty of Paying Living Expenses: Not very hard  Recent Concern: Emergency planning/management officer Strain - High Risk (02/16/2022)    Overall Financial Resource Strain (CARDIA)     Difficulty of Paying Living Expenses: Hard  Food Insecurity: No Food Insecurity (03/24/2022)    Hunger Vital Sign     Worried About Running Out of Food in the Last Year: Never true     [place] in the Last Year: Never true  Recent Concern: Food Insecurity - Food Insecurity Present (02/16/2022)    Hunger Vital Sign     Worried About Running Out of Food in the Last Year: Sometimes true     Ran Out of Food in the Last Year: Sometimes true  Transportation  Needs: No Transportation Needs (03/24/2022)    PRAPARE - Armed forces logistics/support/administrative officer (Medical): No     Lack of Transportation (Non-Medical): No  Physical Activity: Sufficiently Active (03/24/2022)    Exercise Vital Sign     Days of Exercise per Week: 5 days     Minutes of Exercise per Session: 30 min  Stress: Stress Concern Present (  03/24/2022)    [place]     Feeling of Stress : To some extent  Social Connections: Socially Isolated (03/24/2022)    Social Connection and Isolation Panel [NHANES]     Frequency of Communication with Friends and Family: More than three times a week     Frequency of Social Gatherings with Friends and Family: Three times a week     Attends Religious Services: Never     Active Member of Clubs or Organizations: No     Attends Archivist Meetings: Never     Marital Status: Never married  Intimate Partner Violence: Not At Risk (03/24/2022)    Humiliation, Afraid, Rape, and Kick questionnaire     Fear of Current or Ex-Partner: No     Emotionally Abused: No     Physically Abused: No     Sexually Abused: No      Ms. Cammarata's family history is not on file.         Objective:    Objective     Vitals:    07/25/22 1021  BP: 112/76  Pulse: 90      Physical Exam Well-developed well-nourished older white female in no acute distress.  Height, Weight, 142 BMI 26.8   HEENT; nontraumatic normocephalic, EOMI, PE R LA, sclera anicteric. Oropharynx; oropharynx not well-visualized Neck; supple, no JVD, no palpable mass or adenopathy Cardiovascular; regular rate and rhythm with S1-S2, no murmur rub or gallop Pulmonary; Clear bilaterally Abdomen; soft, nontender, nondistended, no palpable mass or hepatosplenomegaly, bowel sounds are active Rectal; not done today Skin; benign exam, no jaundice rash or appreciable lesions Extremities; no clubbing cyanosis or edema skin warm and  dry Neuro/Psych; alert and oriented x4, grossly nonfocal mood and affect appropriate            Assessment & Plan:    #80 67 year old white female with 44-monthhistory of sensation of dysphagia to solids with a feeling of food "sitting" in her throat.  She also has a knot-like sensation in her throat/esophagus and frequent need to drink water to help with the knot-like feeling.  Nighttime symptoms with sensation of saliva not traversing the throat/esophagus when lying flat, getting up multiple times at night to drink water.   Symptoms are atypical for those normally seen with esophageal stricture, though cannot rule out. Rule out esophageal motility disorder, rule out neoplasm pharyngeal ,laryngeal ,esophageal. Rule out oral pharyngeal dysphagia.   #2 history of GERD 3.  Asthma 4.  Depression 5.  History of diverticular disease status post remote partial colectomy for complications of diverticular disease 6.  Colon cancer screening-up-to-date with last colonoscopy May 2021-no polyps follow-up 10 years   Plan; I discussed a barium swallow versus EGD.  Patient declines barium swallow as initial evaluation and wishes to proceed with EGD.  She will be scheduled for upper endoscopy with possible esophageal dilation with Dr. PHenrene Pastor  Procedure was discussed in detail with the patient including indications risk and benefits and she is agreeable to proceed.   She was advised to pick up the prescription for Nexium that her PCP sent and start failure to milligrams p.o. twice daily until EGD is done. She has appointment with ENT on February 13 and also encouraged to keep this appointment. Discussed elevation of the head of the bed 45 to 60 degrees for sleep Further recommendations pending EGD and ENT evaluation.     Amy SGenia HaroldPA-C 07/25/2022

## 2022-08-16 NOTE — Telephone Encounter (Signed)
Pt states she was supposed to get an RX for antacids but cannot remember the name and needs it prescribed. Please advise.

## 2022-08-16 NOTE — Progress Notes (Signed)
To pacu, VSS. Report to [email or web address].tb 

## 2022-08-16 NOTE — Patient Instructions (Signed)
    FOLLOW DILATATION DIET GIVEN TO YOU TODAY    YOU HAD AN ENDOSCOPIC PROCEDURE TODAY AT [facility]:   Refer to the procedure report that was given to you for any specific questions about what was found during the examination.  If the procedure report does not answer your questions, please call your gastroenterologist to clarify.  If you requested that your care partner not be given the details of your procedure findings, then the procedure report has been included in a sealed envelope for you to review at your convenience later.  YOU SHOULD EXPECT: Some feelings of bloating in the abdomen. Passage of more gas than usual.  Walking can help get rid of the air that was put into your GI tract during the procedure and reduce the bloating. If you had a lower endoscopy (such as a colonoscopy or flexible sigmoidoscopy) you may notice spotting of blood in your stool or on the toilet paper. If you underwent a bowel prep for your procedure, you may not have a normal bowel movement for a few days.  Please Note:  You might notice some irritation and congestion in your nose or some drainage.  This is from the oxygen used during your procedure.  There is no need for concern and it should clear up in a day or so.  SYMPTOMS TO REPORT IMMEDIATELY:  Following upper endoscopy (EGD)  Vomiting of blood or coffee ground material  New chest pain or pain under the shoulder blades  Painful or persistently difficult swallowing  New shortness of breath  Fever of 100F or higher  Black, tarry-looking stools  For urgent or emergent issues, a gastroenterologist can be reached at any hour by calling [phone]. Do not use MyChart messaging for urgent concerns.    DIET:  We do recommend a small meal at first, but then you may proceed to your regular diet.  Drink plenty of fluids but you should avoid alcoholic beverages for 24 hours.  ACTIVITY:  You should plan to take it easy for the rest of today  and you should NOT DRIVE or use heavy machinery until tomorrow (because of the sedation medicines used during the test).    FOLLOW UP: Our staff will call the number listed on your records the next business day following your procedure.  We will call around 7:15- 8:00 am to check on you and address any questions or concerns that you may have regarding the information given to you following your procedure. If we do not reach you, we will leave a message.     If any biopsies were taken you will be contacted by phone or by letter within the next 1-3 weeks.  Please call us at [phone] if you have not heard about the biopsies in 3 weeks.    SIGNATURES/CONFIDENTIALITY: You and/or your care partner have signed paperwork which will be entered into your electronic medical record.  These signatures attest to the fact that that the information above on your After Visit Summary has been reviewed and is understood.  Full responsibility of the confidentiality of this discharge information lies with you and/or your care-partner.

## 2022-08-16 NOTE — Telephone Encounter (Signed)
Please advise 

## 2022-08-17 ENCOUNTER — Other Ambulatory Visit: Payer: Self-pay | Admitting: *Deleted

## 2022-08-17 ENCOUNTER — Telehealth: Payer: Self-pay | Admitting: *Deleted

## 2022-08-17 ENCOUNTER — Encounter: Payer: Medicare HMO | Admitting: Rehabilitative and Restorative Service Providers"

## 2022-08-17 ENCOUNTER — Telehealth: Payer: Self-pay | Admitting: Rehabilitative and Restorative Service Providers"

## 2022-08-17 MED ORDER — ESOMEPRAZOLE MAGNESIUM 40 MG PO CPDR: 40.0000 mg | DELAYED_RELEASE_CAPSULE | Freq: Two times a day (BID) | ORAL | 1 refills | Status: DC

## 2022-08-17 NOTE — Telephone Encounter (Signed)
PA needed for Nexium. PA completed through covermymeds, pending decision.

## 2022-08-17 NOTE — Telephone Encounter (Signed)
  Follow up Call-     08/16/2022    9:59 AM  Call back number  Post procedure Call Back phone  # [phone]  Permission to leave phone message No     Patient questions:  Do you have a fever, pain , or abdominal swelling? No. Pain Score  0 *  Have you tolerated food without any problems? Yes.    Have you been able to return to your normal activities? Yes.    Do you have any questions about your discharge instructions: Diet   No. Medications  No. Follow up visit  No.  Do you have questions or concerns about your Care? No.  Actions: * If pain score is 4 or above: No action needed, pain <4.

## 2022-08-17 NOTE — Therapy (Incomplete)
OUTPATIENT PHYSICAL THERAPY TREATMENT   Patient Name: Tracey Saunders MRN: [id number] DOB:December 15, 1955, 67 y.o., female Today's Date: 08/17/2022  END OF SESSION:     Past Medical History:  Diagnosis Date   Anxiety disorder 09/09/2019   B12 deficiency    Cancer ([place])    cervical cancer   Depression    Diverticulitis    Fatty liver    GERD (gastroesophageal reflux disease)    Hyperlipidemia    Hypertension    Insomnia 09/09/2019   Liver cyst    Vitamin D deficiency    Past Surgical History:  Procedure Laterality Date   ABDOMINAL HYSTERECTOMY     COLON RESECTION     due to diverticulitis   Patient Active Problem List   Diagnosis Date Noted   Asthmatic bronchitis , chronic 12/15/2021   Chronic right-sided low back pain with sciatica 06/22/2020   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  Rationale for Evaluation and Treatment: Rehabilitation  ONSET DATE: Approx. 6 months  SUBJECTIVE:                                                                                                                                                                                      SUBJECTIVE STATEMENT: She indicated she has continued to have trouble with Rt shoulder/arm, even when not using it.  She mentioned she had to quit doing things at home due to pain complaints when using arm.  Arrived today with large quad cane in Rt  arm for stabilization with walking.   PERTINENT HISTORY: History Right lateral ankle and right subacromial steroid injections.  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   History of chronic pain in back/leg.   Med hx:  History of cervical cancer, GERD, Hyperlipidemia, HTN  PAIN:  NPRS scale: at worst 10/10 Pain location: Rt shoulder Pain description: catching sharp pain, constant pain Aggravating factors: lifting, reaching, housework activity Relieving factors: Medicine  PRECAUTIONS: None  WEIGHT BEARING RESTRICTIONS: No  FALLS:  Has patient fallen in last 6 months? No  LIVING ENVIRONMENT: Lives with: lives alone Lives in: House/apartment Stairs:7 stairs   OCCUPATION: No specific  work  PLOF: Independent, housework activity, Rt hand dominant.  Read, puzzles, crochet/sewing/needle work ( limited due to pain)  PATIENT GOALS: No pain, get back to activity, avoid surgery   OBJECTIVE:   PATIENT SURVEYS:  07/26/2022 FOTO intake:  46   predicted:  63  COGNITION: 07/26/2022 Overall cognitive status: WFL     SENSATION: 07/26/2022 No specific testing today  POSTURE: 07/26/2022 Rounded shoulders, mild increase in thoracic kyphosis  UPPER EXTREMITY ROM:   ROM Right 07/26/2022 Measured in supine AROM Left 07/26/2022 WFL AROM Right 08/08/2022 Measured in supine  Shoulder flexion 165  95 c pain limited  125 PRM with pain limited  Shoulder extension     Shoulder abduction 150    Shoulder adduction     Shoulder internal rotation 75    Shoulder external rotation 80    Elbow flexion     Elbow extension     Wrist flexion     Wrist extension     Wrist ulnar deviation     Wrist radial deviation     Wrist pronation     Wrist supination     (Blank rows = not tested)  UPPER EXTREMITY MMT:  MMT Right 07/26/2022 Left 07/26/2022 Right   Shoulder flexion 3+/5 c pain 4/5   Shoulder extension     Shoulder abduction 3+/5 c pain 4/5   Shoulder adduction     Shoulder internal rotation 4/5   4/5   Shoulder external rotation 4/5 c pain 4/5   Middle trapezius     Lower trapezius     Elbow flexion     Elbow extension     Wrist flexion     Wrist extension     Wrist ulnar deviation     Wrist radial deviation     Wrist pronation     Wrist supination     Grip strength (lbs)     (Blank rows = not tested)  SHOULDER SPECIAL TESTS: 07/26/2022 (+) Rt shoulder hawkins kennedy, neer, painful arc, ER lag testing, empty can.  (-) Shrug, drop arm Rt shoulder  JOINT MOBILITY TESTING:  07/26/2022 No limitation noted today  PALPATION:  07/26/2022 Tenderness superior/anterior Rt shoulder joint region.    TODAY'S TREATMENT:                                                                                                      DATE: 09/06/2022 Therex:  Therex: Time spent in review of existing HEP c verbal review for all Seated scapular retraction 5 sec hold x 10 Seated Rt shoulder ER isometrics against clinician resistance 5 sec hold x 10  Supine Rt arm flexion c Lt arm AAROM x 10 (limited by pain)  TODAY'S TREATMENT:  DATE: 08/08/2022 Therex:  Therex: Time spent in review of existing HEP c verbal review for all Seated scapular retraction 5 sec hold x 10 Seated Rt shoulder ER isometrics against clinician resistance 5 sec hold x 10  Supine Rt arm flexion c Lt arm AAROM x 10 (limited by pain)       TODAY'S TREATMENT:                                                                                                      DATE: 07/26/2022 Therex:    HEP instruction/performance c cues for techniques, handout provided.  Trial set performed of each for comprehension and symptom assessment.  See below for exercise list  Estim IFC to tolerance Rt shoulder 6 mins for pain relief (stopped per Pt request due to not feeling like it felt good)   PATIENT EDUCATION: 07/26/2022 Education details: HEP,  POC Person educated: Patient Education method: Consulting civil engineer, Demonstration, Verbal cues, and Handouts Education comprehension: verbalized understanding, returned demonstration, and verbal cues required  HOME EXERCISE PROGRAM: Access Code: [email or web address] URL: [email or web address] Date: 07/26/2022 Prepared by: Scot Jun  Exercises - Seated Scapular Retraction  - 3-5 x daily - 7 x weekly - 1 sets - 10 reps - 3-5 hold - Standing Isometric Shoulder External Rotation with Doorway (Mirrored)  - 2-3 x daily - 7 x weekly - 1 sets - 10 reps - 5 hold  ASSESSMENT:  CLINICAL IMPRESSION: Strong pain complaints c continued difficulty with active movements and guarding.  Presentation still seems to be focused on contractile tissue impairments vs. Joint related troubles/tightness.  Limitation in treatment time due to pain complaints.     OBJECTIVE IMPAIRMENTS: decreased activity tolerance, decreased coordination, decreased endurance, decreased mobility, difficulty walking, decreased ROM, decreased strength, increased fascial restrictions, impaired perceived functional ability, increased muscle spasms, impaired flexibility, impaired UE functional use, improper body mechanics, postural dysfunction, and pain.   ACTIVITY LIMITATIONS: carrying, lifting, sleeping, bathing, dressing, reach over head, and hygiene/grooming  PARTICIPATION LIMITATIONS: meal prep, cleaning, laundry, interpersonal relationship, community activity, and hobbies  PERSONAL FACTORS: Time since onset of injury/illness/exacerbation  Med hx:  History of cervical cancer, GERD, Hyperlipidemia, HTNare also affecting patient's functional outcome.   REHAB POTENTIAL: Fair to good  CLINICAL DECISION MAKING: Stable/uncomplicated  EVALUATION COMPLEXITY: Low   GOALS: Goals reviewed with patient? Yes  SHORT TERM GOALS: (target date for Short term goals are 3 weeks 08/16/2022)  1.Patient will demonstrate independent use of home  exercise program to maintain progress from in clinic treatments. Goal status: on going 08/08/2022  LONG TERM GOALS: (target dates for all long term goals are 10 weeks  10/04/2022 )   1. Patient will demonstrate/report pain at worst less than or equal to 2/10 to facilitate minimal limitation in daily activity secondary to pain symptoms. Goal status: New   2. Patient will demonstrate independent use of home exercise program to facilitate ability to maintain/progress functional gains from skilled physical therapy services. Goal status: New   3. Patient will demonstrate FOTO outcome > or = 63 % to indicate reduced disability due  to condition. Goal status: New   4.  Patient will demonstrate Rt UE MMT 4/5 throughout to facilitate lifting, reaching, carrying at [facility] in daily activity.   Goal status: New   5.  Patient will demonstrate Rt GH joint AROM WFL s symptoms to facilitate usual overhead reaching, self care, dressing at PLOF.    Goal status: New   6.  Patient will demonstrate ability to perform AROM against gravity in all directions s symptoms to facilitate overhead reaching in daily life.  Goal status: New   7.  Patient will demonstrate/report ability to perform housework, hobbies at [facility] s limitation.  Goal Status: New  PLAN:  PT FREQUENCY: 1-2x/week (recommended 2x/week)  Pt indicated 2x/month may be what is able to afford Pt reported financial concerns about attending therapy visits.  Clinic will work to adapt to ability to schedule and attend.   PT DURATION: 10 weeks    PLANNED INTERVENTIONS: Therapeutic exercises, Therapeutic activity, Neuro Muscular re-education, Balance training, Gait training, Patient/Family education, Joint mobilization, Stair training, DME instructions, Dry Needling, Electrical stimulation, Traction, Cryotherapy, vasopneumatic device Moist heat, Taping, Ultrasound, Ionotophoresis '4mg'$ /ml Dexamethasone, and Manual therapy.  All included unless  contraindicated  PLAN FOR NEXT SESSION: Recheck HEP.  Monitor symptoms.    Scot Jun, PT, DPT, OCS, ATC 08/17/22  1:04 PM

## 2022-08-17 NOTE — Telephone Encounter (Signed)
Patient returned call. Requests to be called. 

## 2022-08-17 NOTE — Telephone Encounter (Signed)
Patient notified. Patient stated that pharmacy said they didn't receive it. Re-sent to the pharmacy.

## 2022-08-17 NOTE — Telephone Encounter (Signed)
Tried calling patient, unable to leave message, mailbox full.

## 2022-08-17 NOTE — Telephone Encounter (Signed)
Called the patient after no show for appointment.  She had forgotten.  She said she would call back to reschedule.   Scot Jun, PT, DPT, OCS, ATC 08/17/22  3:06 PM

## 2022-08-18 MED ORDER — ESOMEPRAZOLE MAGNESIUM 40 MG PO CPDR: 40.0000 mg | DELAYED_RELEASE_CAPSULE | Freq: Two times a day (BID) | ORAL | 2 refills | Status: DC

## 2022-08-18 NOTE — Addendum Note (Signed)
Addended by: Zacarias Pontes on: 08/18/2022 03:07 PM   Modules accepted: Orders

## 2022-08-22 MED ORDER — ESOMEPRAZOLE MAGNESIUM 40 MG PO CPDR: 40.0000 mg | DELAYED_RELEASE_CAPSULE | Freq: Every day | ORAL | 2 refills | Status: DC

## 2022-08-22 NOTE — Addendum Note (Signed)
Addended by: Zacarias Pontes on: 08/22/2022 04:17 PM   Modules accepted: Orders

## 2022-08-23 DIAGNOSIS — R69 Illness, unspecified: Secondary | ICD-10-CM | POA: Diagnosis not present

## 2022-08-23 DIAGNOSIS — R682 Dry mouth, unspecified: Secondary | ICD-10-CM | POA: Diagnosis not present

## 2022-08-23 DIAGNOSIS — K219 Gastro-esophageal reflux disease without esophagitis: Secondary | ICD-10-CM | POA: Diagnosis not present

## 2022-08-31 NOTE — Telephone Encounter (Signed)
Patient states: -She received nexium 40 mg via [facility] mail order in the mail recently  - She also got a letter from insurance telling her that an appeal for the medication was denied and a new one must be submitted  - She is thoroughly confused since she got the medication  - Insurance states PCP needs to place an appeal   Patient was not able to provide anymore information due to confusion. States she didn't request the medication via mail order. Requests we call insurance to straighten this out.

## 2022-08-31 NOTE — Telephone Encounter (Signed)
Returned call to patient and informed her that insurance did not want to cover the 80 mg, so pcp said to send in 40 mg. Patient verbalized understanding.

## 2022-09-07 ENCOUNTER — Telehealth: Payer: Self-pay | Admitting: Licensed Clinical Social Worker

## 2022-09-07 ENCOUNTER — Encounter: Payer: Medicare HMO | Admitting: Physical Therapy

## 2022-09-07 NOTE — Therapy (Incomplete)
OUTPATIENT PHYSICAL THERAPY TREATMENT   Patient Name: Tracey Saunders MRN: [id number] DOB:June 27, 1956, 67 y.o., female Today's Date: 09/07/2022  END OF SESSION:     Past Medical History:  Diagnosis Date   Anxiety disorder 09/09/2019   B12 deficiency    Cancer ([place])    cervical cancer   Depression    Diverticulitis    Fatty liver    GERD (gastroesophageal reflux disease)    Hyperlipidemia    Hypertension    Insomnia 09/09/2019   Liver cyst    Vitamin D deficiency    Past Surgical History:  Procedure Laterality Date   ABDOMINAL HYSTERECTOMY     COLON RESECTION     due to diverticulitis   Patient Active Problem List   Diagnosis Date Noted   Asthmatic bronchitis , chronic 12/15/2021   Chronic right-sided low back pain with sciatica 06/22/2020   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  Rationale for Evaluation and Treatment: Rehabilitation  ONSET DATE: Approx. 6 months  SUBJECTIVE:                                                                                                                                                                                      SUBJECTIVE STATEMENT: *** She indicated she has continued to have trouble with Rt shoulder/arm, even when not using it.  She mentioned she had to quit doing things at home due to pain complaints when using arm.  Arrived today with large quad cane  in Rt arm for stabilization with walking.   PERTINENT HISTORY: History Right lateral ankle and right subacromial steroid injections.  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   History of chronic pain in back/leg.   Med hx:  History of cervical cancer, GERD, Hyperlipidemia, HTN  PAIN:  NPRS scale: at worst 10/10 Pain location: Rt shoulder Pain description: catching sharp pain, constant pain Aggravating factors: lifting, reaching, housework activity Relieving factors: Medicine  PRECAUTIONS: None  WEIGHT BEARING RESTRICTIONS: No  FALLS:  Has patient fallen in last 6 months? No  LIVING ENVIRONMENT: Lives with: lives alone Lives in: House/apartment Stairs:7 stairs   OCCUPATION: No  specific work  PLOF: Independent, housework activity, Rt hand dominant.  Read, puzzles, crochet/sewing/needle work ( limited due to pain)  PATIENT GOALS: No pain, get back to activity, avoid surgery   OBJECTIVE:   PATIENT SURVEYS:  07/26/2022 FOTO intake:  46   predicted:  63  COGNITION: 07/26/2022 Overall cognitive status: WFL     SENSATION: 07/26/2022 No specific testing today  POSTURE: 07/26/2022 Rounded shoulders, mild increase in thoracic kyphosis  UPPER EXTREMITY ROM:   ROM Right 07/26/2022 Measured in supine AROM Left 07/26/2022 WFL AROM Right 08/08/2022 Measured in supine  Shoulder flexion 165  95 c pain limited  125 PRM with pain limite  Shoulder extension     Shoulder abduction 150    Shoulder adduction     Shoulder internal rotation 75    Shoulder external rotation 80    Elbow flexion     Elbow extension     Wrist flexion     Wrist extension     Wrist ulnar deviation     Wrist radial deviation     Wrist pronation     Wrist supination     (Blank rows = not tested)  UPPER EXTREMITY MMT:  MMT Right 07/26/2022 Left 07/26/2022 Right 08/08/2022  Shoulder flexion 3+/5 c pain 4/5   Shoulder extension     Shoulder abduction 3+/5 c pain 4/5   Shoulder adduction     Shoulder internal  rotation 4/5  4/5   Shoulder external rotation 4/5 c pain 4/5   Middle trapezius     Lower trapezius     Elbow flexion     Elbow extension     Wrist flexion     Wrist extension     Wrist ulnar deviation     Wrist radial deviation     Wrist pronation     Wrist supination     Grip strength (lbs)     (Blank rows = not tested)  SHOULDER SPECIAL TESTS: 07/26/2022 (+) Rt shoulder hawkins kennedy, neer, painful arc, ER lag testing, empty can.  (-) Shrug, drop arm Rt shoulder  JOINT MOBILITY TESTING:  07/26/2022 No limitation noted today  PALPATION:  07/26/2022 Tenderness superior/anterior Rt shoulder joint region.     TODAY'S TREATMENT:                                                                                                      DATE: 09/07/2022 Therex: *** Time spent in review of existing HEP c verbal review for all Seated scapular retraction 5 sec hold x 10 Seated Rt shoulder ER isometrics against clinician resistance 5 sec hold x 10  Supine Rt arm flexion c Lt arm AAROM x 10 (limited by pain)    TODAY'S TREATMENT:  DATE: 08/08/2022 Therex: Time spent in review of existing HEP c verbal review for all Seated scapular retraction 5 sec hold x 10 Seated Rt shoulder ER isometrics against clinician resistance 5 sec hold x 10  Supine Rt arm flexion c Lt arm AAROM x 10 (limited by pain)      TODAY'S TREATMENT:                                                                                                      DATE: 07/26/2022 Therex:    HEP instruction/performance c cues for techniques, handout provided.  Trial set performed of each for comprehension and symptom assessment.  See below for exercise list  Estim IFC to tolerance Rt shoulder 6 mins for pain relief (stopped per Pt request due to not feeling like it felt good)   PATIENT EDUCATION: 07/26/2022 Education details:  HEP, POC Person educated: Patient Education method: Consulting civil engineer, Demonstration, Verbal cues, and Handouts Education comprehension: verbalized understanding, returned demonstration, and verbal cues required  HOME EXERCISE PROGRAM: Access Code: [email or web address] URL: [email or web address] Date: 07/26/2022 Prepared by: Scot Jun  Exercises - Seated Scapular Retraction  - 3-5 x daily - 7 x weekly - 1 sets - 10 reps - 3-5 hold - Standing Isometric Shoulder External Rotation with Doorway (Mirrored)  - 2-3 x daily - 7 x weekly - 1 sets - 10 reps - 5 hold  ASSESSMENT:  CLINICAL IMPRESSION: ***  Strong pain complaints c continued difficulty with active movements and guarding.  Presentation still seems to be focused on contractile tissue impairments vs. Joint related troubles/tightness.  Limitation in treatment time due to pain complaints.     OBJECTIVE IMPAIRMENTS: decreased activity tolerance, decreased coordination, decreased endurance, decreased mobility, difficulty walking, decreased ROM, decreased strength, increased fascial restrictions, impaired perceived functional ability, increased muscle spasms, impaired flexibility, impaired UE functional use, improper body mechanics, postural dysfunction, and pain.   ACTIVITY LIMITATIONS: carrying, lifting, sleeping, bathing, dressing, reach over head, and hygiene/grooming  PARTICIPATION LIMITATIONS: meal prep, cleaning, laundry, interpersonal relationship, community activity, and hobbies  PERSONAL FACTORS: Time since onset of injury/illness/exacerbation  Med hx:  History of cervical cancer, GERD, Hyperlipidemia, HTNare also affecting patient's functional outcome.   REHAB POTENTIAL: Fair to good  CLINICAL DECISION MAKING: Stable/uncomplicated  EVALUATION COMPLEXITY: Low   GOALS: Goals reviewed with patient? Yes  SHORT TERM GOALS: (target date for Short term goals are 3 weeks 08/16/2022)  1.Patient will demonstrate independent  use of home exercise program to maintain progress from in clinic treatments. Goal status: on going 08/08/2022  LONG TERM GOALS: (target dates for all long term goals are 10 weeks  10/04/2022 )   1. Patient will demonstrate/report pain at worst less than or equal to 2/10 to facilitate minimal limitation in daily activity secondary to pain symptoms. Goal status: New   2. Patient will demonstrate independent use of home exercise program to facilitate ability to maintain/progress functional gains from skilled physical therapy services. Goal status: New   3. Patient will demonstrate FOTO outcome > or = 63 % to indicate reduced disability due to  condition. Goal status: New   4.  Patient will demonstrate Rt UE MMT 4/5 throughout to facilitate lifting, reaching, carrying at [facility] in daily activity.   Goal status: New   5.  Patient will demonstrate Rt GH joint AROM WFL s symptoms to facilitate usual overhead reaching, self care, dressing at PLOF.    Goal status: New   6.  Patient will demonstrate ability to perform AROM against gravity in all directions s symptoms to facilitate overhead reaching in daily life.  Goal status: New   7.  Patient will demonstrate/report ability to perform housework, hobbies at [facility] s limitation.  Goal Status: New  PLAN:  PT FREQUENCY: 1-2x/week (recommended 2x/week)  Pt indicated 2x/month may be what is able to afford Pt reported financial concerns about attending therapy visits.  Clinic will work to adapt to ability to schedule and attend.   PT DURATION: 10 weeks    PLANNED INTERVENTIONS: Therapeutic exercises, Therapeutic activity, Neuro Muscular re-education, Balance training, Gait training, Patient/Family education, Joint mobilization, Stair training, DME instructions, Dry Needling, Electrical stimulation, Traction, Cryotherapy, vasopneumatic device Moist heat, Taping, Ultrasound, Ionotophoresis '4mg'$ /ml Dexamethasone, and Manual therapy.  All included unless  contraindicated  PLAN FOR NEXT SESSION: *** Recheck HEP.  Monitor symptoms.    Laureen Abrahams, PT, DPT 09/07/22 10:10 AM

## 2022-09-07 NOTE — Telephone Encounter (Signed)
H&V Care Navigation CSW Progress Note  Clinical Social Worker contacted patient by phone to f/u on missed call to work cell. Pt did not leave any voicemail and when I returned her call at [phone] there was no answer, voicemail full. Sent text message letting her know I am available, to please leave a voicemail if I do not answer and that her voicemail is full. Remain available should pt return my call.  Patient is participating in a Managed Medicaid Plan:  No, [employer] Medicare only.  SDOH Screenings   Food Insecurity: No Food Insecurity (03/24/2022)  Recent Concern: Food Insecurity - Food Insecurity Present (02/16/2022)  Housing: Low Risk  (03/24/2022)  Recent Concern: Housing - Medium Risk (02/16/2022)  Transportation Needs: No Transportation Needs (03/24/2022)  Alcohol Screen: Low Risk  (09/16/2020)  Depression (PHQ2-9): Low Risk  (08/11/2022)  Financial Resource Strain: Low Risk  (03/24/2022)  Recent Concern: Financial Resource Strain - High Risk (02/16/2022)  Physical Activity: Sufficiently Active (03/24/2022)  Social Connections: Socially Isolated (03/24/2022)  Stress: Stress Concern Present (03/24/2022)  Tobacco Use: High Risk (08/16/2022)   Westley Hummer, MSW, LCSW Clinical Social Worker [place]  [phone]- work cell phone (preferred) [phone]- desk phone

## 2022-09-07 NOTE — Therapy (Incomplete)
OUTPATIENT PHYSICAL THERAPY TREATMENT   Patient Name: Tracey Saunders MRN: [id number] DOB:December 09, 1955, 67 y.o., female Today's Date: 09/07/2022  END OF SESSION:     Past Medical History:  Diagnosis Date   Anxiety disorder 09/09/2019   B12 deficiency    Cancer ([place])    cervical cancer   Depression    Diverticulitis    Fatty liver    GERD (gastroesophageal reflux disease)    Hyperlipidemia    Hypertension    Insomnia 09/09/2019   Liver cyst    Vitamin D deficiency    Past Surgical History:  Procedure Laterality Date   ABDOMINAL HYSTERECTOMY     COLON RESECTION     due to diverticulitis   Patient Active Problem List   Diagnosis Date Noted   Asthmatic bronchitis , chronic 12/15/2021   Chronic right-sided low back pain with sciatica 06/22/2020   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  Rationale for Evaluation and Treatment: Rehabilitation  ONSET DATE: Approx. 6 months  SUBJECTIVE:                                                                                                                                                                                      SUBJECTIVE STATEMENT: She indicated she has continued to have trouble with Rt shoulder/arm, even when not using it.  She mentioned she had to quit doing things at home due to pain complaints when using arm.  Arrived today with large quad cane in Rt  arm for stabilization with walking.   PERTINENT HISTORY: History Right lateral ankle and right subacromial steroid injections.  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   History of chronic pain in back/leg.   Med hx:  History of cervical cancer, GERD, Hyperlipidemia, HTN  PAIN:  NPRS scale: at worst 10/10 Pain location: Rt shoulder Pain description: catching sharp pain, constant pain Aggravating factors: lifting, reaching, housework activity Relieving factors: Medicine  PRECAUTIONS: None  WEIGHT BEARING RESTRICTIONS: No  FALLS:  Has patient fallen in last 6 months? No  LIVING ENVIRONMENT: Lives with: lives alone Lives in: House/apartment Stairs:7 stairs   OCCUPATION: No specific  work  PLOF: Independent, housework activity, Rt hand dominant.  Read, puzzles, crochet/sewing/needle work ( limited due to pain)  PATIENT GOALS: No pain, get back to activity, avoid surgery   OBJECTIVE:   PATIENT SURVEYS:  07/26/2022 FOTO intake:  46   predicted:  63  COGNITION: 07/26/2022 Overall cognitive status: WFL     SENSATION: 07/26/2022 No specific testing today  POSTURE: 07/26/2022 Rounded shoulders, mild increase in thoracic kyphosis  UPPER EXTREMITY ROM:   ROM Right 07/26/2022 Measured in supine AROM Left 07/26/2022 WFL AROM Right 08/08/2022 Measured in supine  Shoulder flexion 165  95 c pain limited  125 PRM with pain limited  Shoulder extension     Shoulder abduction 150    Shoulder adduction     Shoulder internal rotation 75    Shoulder external rotation 80    Elbow flexion     Elbow extension     Wrist flexion     Wrist extension     Wrist ulnar deviation     Wrist radial deviation     Wrist pronation     Wrist supination     (Blank rows = not tested)  UPPER EXTREMITY MMT:  MMT Right 07/26/2022 Left 07/26/2022 Right   Shoulder flexion 3+/5 c pain 4/5   Shoulder extension     Shoulder abduction 3+/5 c pain 4/5   Shoulder adduction     Shoulder internal rotation 4/5   4/5   Shoulder external rotation 4/5 c pain 4/5   Middle trapezius     Lower trapezius     Elbow flexion     Elbow extension     Wrist flexion     Wrist extension     Wrist ulnar deviation     Wrist radial deviation     Wrist pronation     Wrist supination     Grip strength (lbs)     (Blank rows = not tested)  SHOULDER SPECIAL TESTS: 07/26/2022 (+) Rt shoulder hawkins kennedy, neer, painful arc, ER lag testing, empty can.  (-) Shrug, drop arm Rt shoulder  JOINT MOBILITY TESTING:  07/26/2022 No limitation noted today  PALPATION:  07/26/2022 Tenderness superior/anterior Rt shoulder joint region.      TODAY'S TREATMENT:                                                                                                      DATE: 09/07/2022 Therex:  Therex: Time spent in review of existing HEP c verbal review for all Seated scapular retraction 5 sec hold x 10 Seated Rt shoulder ER isometrics against clinician resistance 5 sec hold x 10  Supine Rt arm flexion c Lt arm AAROM x 10 (limited by pain)  TODAY'S TREATMENT:  DATE: 08/08/2022 Therex:  Therex: Time spent in review of existing HEP c verbal review for all Seated scapular retraction 5 sec hold x 10 Seated Rt shoulder ER isometrics against clinician resistance 5 sec hold x 10  Supine Rt arm flexion c Lt arm AAROM x 10 (limited by pain)       TODAY'S TREATMENT:                                                                                                      DATE: 07/26/2022 Therex:    HEP instruction/performance c cues for techniques, handout provided.  Trial set performed of each for comprehension and symptom assessment.  See below for exercise list  Estim IFC to tolerance Rt shoulder 6 mins for pain relief (stopped per Pt request due to not feeling like it felt good)   PATIENT EDUCATION: 07/26/2022 Education details:  HEP, POC Person educated: Patient Education method: Consulting civil engineer, Demonstration, Verbal cues, and Handouts Education comprehension: verbalized understanding, returned demonstration, and verbal cues required  HOME EXERCISE PROGRAM: Access Code: [email or web address] URL: [email or web address] Date: 07/26/2022 Prepared by: Scot Jun  Exercises - Seated Scapular Retraction  - 3-5 x daily - 7 x weekly - 1 sets - 10 reps - 3-5 hold - Standing Isometric Shoulder External Rotation with Doorway (Mirrored)  - 2-3 x daily - 7 x weekly - 1 sets - 10 reps - 5 hold  ASSESSMENT:  CLINICAL IMPRESSION: Strong pain complaints c continued difficulty with active movements and guarding.  Presentation still seems to be focused on contractile tissue impairments vs. Joint related troubles/tightness.  Limitation in treatment time due to pain complaints.    OBJECTIVE IMPAIRMENTS: decreased activity tolerance, decreased coordination, decreased endurance, decreased mobility, difficulty walking, decreased ROM, decreased strength, increased fascial restrictions, impaired perceived functional ability, increased muscle spasms, impaired flexibility, impaired UE functional use, improper body mechanics, postural dysfunction, and pain.   ACTIVITY LIMITATIONS: carrying, lifting, sleeping, bathing, dressing, reach over head, and hygiene/grooming  PARTICIPATION LIMITATIONS: meal prep, cleaning, laundry, interpersonal relationship, community activity, and hobbies  PERSONAL FACTORS: Time since onset of injury/illness/exacerbation  Med hx:  History of cervical cancer, GERD, Hyperlipidemia, HTNare also affecting patient's functional outcome.   REHAB POTENTIAL: Fair to good  CLINICAL DECISION MAKING: Stable/uncomplicated  EVALUATION COMPLEXITY: Low   GOALS: Goals reviewed with patient? Yes  SHORT TERM GOALS: (target date for Short term goals are 3 weeks 08/16/2022)  1.Patient will demonstrate independent use of  home exercise program to maintain progress from in clinic treatments. Goal status: on going 08/08/2022  LONG TERM GOALS: (target dates for all long term goals are 10 weeks  10/04/2022 )   1. Patient will demonstrate/report pain at worst less than or equal to 2/10 to facilitate minimal limitation in daily activity secondary to pain symptoms. Goal status: New   2. Patient will demonstrate independent use of home exercise program to facilitate ability to maintain/progress functional gains from skilled physical therapy services. Goal status: New   3. Patient will demonstrate FOTO outcome > or = 63 % to indicate reduced disability due to  condition. Goal status: New   4.  Patient will demonstrate Rt UE MMT 4/5 throughout to facilitate lifting, reaching, carrying at [facility] in daily activity.   Goal status: New   5.  Patient will demonstrate Rt GH joint AROM WFL s symptoms to facilitate usual overhead reaching, self care, dressing at PLOF.    Goal status: New   6.  Patient will demonstrate ability to perform AROM against gravity in all directions s symptoms to facilitate overhead reaching in daily life.  Goal status: New   7.  Patient will demonstrate/report ability to perform housework, hobbies at [facility] s limitation.  Goal Status: New  PLAN:  PT FREQUENCY: 1-2x/week (recommended 2x/week)  Pt indicated 2x/month may be what is able to afford Pt reported financial concerns about attending therapy visits.  Clinic will work to adapt to ability to schedule and attend.   PT DURATION: 10 weeks    PLANNED INTERVENTIONS: Therapeutic exercises, Therapeutic activity, Neuro Muscular re-education, Balance training, Gait training, Patient/Family education, Joint mobilization, Stair training, DME instructions, Dry Needling, Electrical stimulation, Traction, Cryotherapy, vasopneumatic device Moist heat, Taping, Ultrasound, Ionotophoresis '4mg'$ /ml Dexamethasone, and Manual therapy.  All included unless  contraindicated  PLAN FOR NEXT SESSION: Recheck HEP.  Monitor symptoms.    Scot Jun, PT, DPT, OCS, ATC 09/07/22  9:28 AM

## 2022-09-08 ENCOUNTER — Encounter: Payer: Self-pay | Admitting: Rehabilitative and Restorative Service Providers"

## 2022-09-08 ENCOUNTER — Ambulatory Visit: Payer: Medicare HMO | Admitting: Rehabilitative and Restorative Service Providers"

## 2022-09-08 ENCOUNTER — Encounter: Payer: Medicare HMO | Admitting: Rehabilitative and Restorative Service Providers"

## 2022-09-08 DIAGNOSIS — G8929 Other chronic pain: Secondary | ICD-10-CM

## 2022-09-08 DIAGNOSIS — M25511 Pain in right shoulder: Secondary | ICD-10-CM

## 2022-09-08 DIAGNOSIS — M6281 Muscle weakness (generalized): Secondary | ICD-10-CM | POA: Diagnosis not present

## 2022-09-08 DIAGNOSIS — R293 Abnormal posture: Secondary | ICD-10-CM

## 2022-09-08 NOTE — Therapy (Addendum)
OUTPATIENT PHYSICAL THERAPY TREATMENT / PROGRESS NOTE [place]   Patient Name: Charlena Radwanski MRN: [id number] DOB:[birth year], 67 y.o., female Today's Date: 09/08/2022  Progress Note Reporting Period 07/26/2022 to 09/08/2022  See note below for Objective Data and Assessment of Progress/Goals.      END OF SESSION:  PT End of Session - 09/08/22 1419     Visit Number 3    Number of Visits 20    Date for PT Re-Evaluation 10/04/22    Authorization Type Medicare $20 copay    Progress Note Due on Visit 10    PT Start Time [id number]    PT Stop Time 1456    PT Time Calculation (min) 31 min    Activity Tolerance Patient limited by pain    Behavior During Therapy [facility] for tasks assessed/performed               Past Medical History:  Diagnosis Date   Anxiety disorder 09/09/2019   B12 deficiency    Cancer ([employer])    cervical cancer   Depression    Diverticulitis    Fatty liver    GERD (gastroesophageal reflux disease)    Hyperlipidemia    Hypertension    Insomnia 09/09/2019   Liver cyst    Vitamin D deficiency    Past Surgical History:  Procedure Laterality Date   ABDOMINAL HYSTERECTOMY     COLON RESECTION     due to diverticulitis   Patient Active Problem List   Diagnosis Date Noted   Asthmatic bronchitis , chronic 12/15/2021   Chronic right-sided low back pain with sciatica 06/22/2020   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   Headache, drug induced 03/12/2020   Hair thinning 11/12/2019   Recurrent major depressive disorder, in full remission ([place]) 11/04/2019   Gastroesophageal reflux disease 09/09/2019   Insomnia 09/09/2019   Anxiety disorder 09/09/2019   Essential hypertension 06/03/2019   Hyperlipidemia 06/03/2019   History of cancer 04/29/2019   Vitamin B12 deficiency 04/29/2019   Vitamin D deficiency 04/29/2019    PCP: Reginold Agent PROVIDER: [phone] (ICD-10-CM) - Chronic right shoulder pain  REFERRING DIAG: Gregor Hams, MD  THERAPY  DIAG:  Chronic right shoulder pain  Muscle weakness (generalized)  Abnormal posture  Rationale for Evaluation and Treatment: Rehabilitation  ONSET DATE: Approx. 6 months  SUBJECTIVE:                                                                                                                                                                                      SUBJECTIVE STATEMENT: She indicated continued complaints in arm and with nighttime pain.    PERTINENT HISTORY: History Right  lateral ankle and right subacromial steroid injections.  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   History of chronic pain in back/leg.   Med hx:  History of cervical cancer, GERD, Hyperlipidemia, HTN  PAIN:  NPRS scale: upon arrival:  3/10.  At worst 10/10 Pain location: Rt shoulder Pain description: catching sharp pain, constant pain Aggravating factors: night, lifting, reaching, housework activity Relieving factors: Medicine, ice/heat  PRECAUTIONS: None  WEIGHT BEARING RESTRICTIONS: No  FALLS:  Has patient fallen in last 6 months? No  LIVING ENVIRONMENT: Lives with: lives alone Lives in: House/apartment Stairs:7 stairs   OCCUPATION: No specific work  PLOF: Independent, housework activity, Rt hand dominant.  Read, puzzles, crochet/sewing/needle work ( limited due to pain)  PATIENT GOALS: No pain, get back to activity, avoid surgery   OBJECTIVE:   PATIENT SURVEYS:  09/08/2022: FOTO 48  07/26/2022 FOTO intake:  46   predicted:  63  COGNITION: 07/26/2022 Overall cognitive status: WFL     SENSATION: 07/26/2022 No specific testing today  POSTURE: 07/26/2022 Rounded shoulders, mild increase in thoracic kyphosis  UPPER EXTREMITY ROM:   ROM Right 07/26/2022 Measured in supine AROM Left 07/26/2022 WFL AROM Right 08/08/2022 Measured in supine Right 09/08/2022  Shoulder flexion 165  95 c pain limited  125 PROM with pain limited 130 AROM in supine  Shoulder extension      Shoulder abduction  150   130 AROM in supine  Shoulder adduction      Shoulder internal rotation 75   80 AROM in 45 deg abduction  Shoulder external rotation 80   70 AROM in 45 deg abduction   Elbow flexion      Elbow extension      Wrist flexion      Wrist extension      Wrist ulnar deviation      Wrist radial deviation      Wrist pronation      Wrist supination      (Blank rows = not tested)  UPPER EXTREMITY MMT:  MMT Right 07/26/2022 Left 07/26/2022 Right 09/08/2022  Shoulder flexion 3+/5 c pain 4/5 4+/5 c pain  Shoulder extension     Shoulder abduction 3+/5 c pain 4/5 4/5 c pain  Shoulder adduction     Shoulder internal rotation 4/5  4/5 5/5  Shoulder external rotation 4/5 c pain 4/5 4/5 c pain   Middle trapezius     Lower trapezius     Elbow flexion     Elbow extension     Wrist flexion     Wrist extension     Wrist ulnar deviation     Wrist radial deviation     Wrist pronation     Wrist supination     Grip strength (lbs)     (Blank rows = not tested)  SHOULDER SPECIAL TESTS: 09/08/2022:  (-) painful arc, shrug Rt shoulder in elevaiton   07/26/2022 (+) Rt shoulder hawkins kennedy, neer, painful arc, ER lag testing, empty can.  (-) Shrug, drop arm Rt shoulder  JOINT MOBILITY TESTING:  07/26/2022 No limitation noted today  PALPATION:  07/26/2022 Tenderness superior/anterior Rt shoulder joint region.    TODAY'S TREATMENT:  DATE: 09/08/2022 Therex: Nustep UE/LE 5 mins  Supine Rt shoulder flexion AROM x 10  Seated scapular retraction 5 sec hold x 10   Review of existing HEP and handout provided again.  Cues for positioning to improve performance tolerance.   TODAY'S TREATMENT:                                                                                                      DATE: 08/08/2022 Therex:  Time spent in review of existing HEP c verbal review for all Seated scapular  retraction 5 sec hold x 10 Seated Rt shoulder ER isometrics against clinician resistance 5 sec hold x 10  Supine Rt arm flexion c Lt arm AAROM x 10 (limited by pain)       TODAY'S TREATMENT:                                                                                                      DATE: 07/26/2022 Therex:    HEP instruction/performance c cues for techniques, handout provided.  Trial set performed of each for comprehension and symptom assessment.  See below for exercise list  Estim IFC to tolerance Rt shoulder 6 mins for pain relief (stopped per Pt request due to not feeling like it felt good)   PATIENT EDUCATION: 09/08/2022 Education details: HEP, POC Person educated: Patient Education method: Explanation, Demonstration, Verbal cues, and Handouts Education comprehension: verbalized understanding, returned demonstration, and verbal cues required  HOME EXERCISE PROGRAM: Access Code: [email or web address] URL: [email or web address] Date: 09/08/2022 Prepared by: Scot Jun  Exercises - Seated Scapular Retraction  - 3-5 x daily - 7 x weekly - 1 sets - 10 reps - 3-5 hold - Standing Isometric Shoulder External Rotation with Doorway (Mirrored)  - 1-2 x daily - 7 x weekly - 1 sets - 10 reps - 5 hold - Standing Isometric Shoulder Flexion with Doorway - Arm Bent  - 1-2 x daily - 7 x weekly - 1 sets - 10 reps - 5-10 hold - Supine Shoulder Flexion Extension AAROM with [place]  - 1-2 x daily - 7 x weekly - 1-2 sets - 10-15 reps - 3 hold  ASSESSMENT:  CLINICAL IMPRESSION: Pt has attended only 3 visits in time since evaluation (cost was a concern).  At this time, Pt continued to report pain in Rt shoulder c mobility, strength that impacts her daily life.  See objective data for updated information.  There were some mild improvements in Rt shoulder testing as noted compared to evaluation.  Interventions were still limited by immediate pain responses per Pt report.  Plan to return to MD  office for follow up as scheduled.  Possible return for skilled PT services available pending MD visit results.     OBJECTIVE IMPAIRMENTS: decreased activity tolerance, decreased coordination, decreased endurance, decreased mobility, difficulty walking, decreased ROM, decreased strength, increased fascial restrictions, impaired perceived functional ability, increased muscle spasms, impaired flexibility, impaired UE functional use, improper body mechanics, postural dysfunction, and pain.   ACTIVITY LIMITATIONS: carrying, lifting, sleeping, bathing, dressing, reach over head, and hygiene/grooming  PARTICIPATION LIMITATIONS: meal prep, cleaning, laundry, interpersonal relationship, community activity, and hobbies  PERSONAL FACTORS: Time since onset of injury/illness/exacerbation  Med hx:  History of cervical cancer, GERD, Hyperlipidemia, HTNare also affecting patient's functional outcome.   REHAB POTENTIAL: Fair to good  CLINICAL DECISION MAKING: Stable/uncomplicated  EVALUATION COMPLEXITY: Low   GOALS: Goals reviewed with patient? Yes  SHORT TERM GOALS: (target date for Short term goals are 3 weeks 08/16/2022)  1.Patient will demonstrate independent use of home exercise program to maintain progress from in clinic treatments. Goal status: on going 08/08/2022  LONG TERM GOALS: (target dates for all long term goals are 10 weeks  10/04/2022 )   1. Patient will demonstrate/report pain at worst less than or equal to 2/10 to facilitate minimal limitation in daily activity secondary to pain symptoms. Goal status: on going 09/08/2022   2. Patient will demonstrate independent use of home exercise program to facilitate ability to maintain/progress functional gains from skilled physical therapy services. Goal status: on going 09/08/2022   3. Patient will demonstrate FOTO outcome > or = 63 % to indicate reduced disability due to condition. Goal status: on going 09/08/2022   4.  Patient will  demonstrate Rt UE MMT 4/5 throughout to facilitate lifting, reaching, carrying at [facility] in daily activity.   Goal status: on going 09/08/2022   5.  Patient will demonstrate Rt GH joint AROM WFL s symptoms to facilitate usual overhead reaching, self care, dressing at PLOF.    Goal status: on going 09/08/2022   6.  Patient will demonstrate ability to perform AROM against gravity in all directions s symptoms to facilitate overhead reaching in daily life.  Goal status: on going 09/08/2022   7.  Patient will demonstrate/report ability to perform housework, hobbies at [facility] s limitation.  Goal Status: on going 09/08/2022  PLAN:  PT FREQUENCY: 1-2x/week (recommended 2x/week)  Pt indicated 2x/month may be what is able to afford Pt reported financial concerns about attending therapy visits.  Clinic will work to adapt to ability to schedule and attend.   PT DURATION: 10 weeks   PLANNED INTERVENTIONS: Therapeutic exercises, Therapeutic activity, Neuro Muscular re-education, Balance training, Gait training, Patient/Family education, Joint mobilization, Stair training, DME instructions, Dry Needling, Electrical stimulation, Traction, Cryotherapy, vasopneumatic device Moist heat, Taping, Ultrasound, Ionotophoresis 4mg /ml Dexamethasone, and Manual therapy.  All included unless contraindicated  PLAN FOR NEXT SESSION: Follow up on MD visit results.    Scot Jun, PT, DPT, OCS, ATC 09/08/22  2:59 PM   PHYSICAL THERAPY DISCHARGE SUMMARY  Visits from Start of Care: 3  Current functional level related to goals / functional outcomes: See note   Remaining deficits: See note   Education / Equipment: HEP  Patient goals were partially met. Patient is being discharged due to not returning since the last visit.  Scot Jun, PT, DPT, OCS, ATC 10/13/22  2:05 PM

## 2022-09-08 NOTE — Therapy (Incomplete)
OUTPATIENT PHYSICAL THERAPY TREATMENT   Patient Name: Tracey Saunders MRN: [id number] DOB:December 15, 1955, 67 y.o., female Today's Date: 08/17/2022  END OF SESSION:     Past Medical History:  Diagnosis Date   Anxiety disorder 09/09/2019   B12 deficiency    Cancer ([place])    cervical cancer   Depression    Diverticulitis    Fatty liver    GERD (gastroesophageal reflux disease)    Hyperlipidemia    Hypertension    Insomnia 09/09/2019   Liver cyst    Vitamin D deficiency    Past Surgical History:  Procedure Laterality Date   ABDOMINAL HYSTERECTOMY     COLON RESECTION     due to diverticulitis   Patient Active Problem List   Diagnosis Date Noted   Asthmatic bronchitis , chronic 12/15/2021   Chronic right-sided low back pain with sciatica 06/22/2020   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  Rationale for Evaluation and Treatment: Rehabilitation  ONSET DATE: Approx. 6 months  SUBJECTIVE:                                                                                                                                                                                      SUBJECTIVE STATEMENT: She indicated she has continued to have trouble with Rt shoulder/arm, even when not using it.  She mentioned she had to quit doing things at home due to pain complaints when using arm.  Arrived today with large quad cane in Rt  arm for stabilization with walking.   PERTINENT HISTORY: History Right lateral ankle and right subacromial steroid injections.  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   History of chronic pain in back/leg.   Med hx:  History of cervical cancer, GERD, Hyperlipidemia, HTN  PAIN:  NPRS scale: at worst 10/10 Pain location: Rt shoulder Pain description: catching sharp pain, constant pain Aggravating factors: lifting, reaching, housework activity Relieving factors: Medicine  PRECAUTIONS: None  WEIGHT BEARING RESTRICTIONS: No  FALLS:  Has patient fallen in last 6 months? No  LIVING ENVIRONMENT: Lives with: lives alone Lives in: House/apartment Stairs:7 stairs   OCCUPATION: No specific  work  PLOF: Independent, housework activity, Rt hand dominant.  Read, puzzles, crochet/sewing/needle work ( limited due to pain)  PATIENT GOALS: No pain, get back to activity, avoid surgery   OBJECTIVE:   PATIENT SURVEYS:  07/26/2022 FOTO intake:  46   predicted:  63  COGNITION: 07/26/2022 Overall cognitive status: WFL     SENSATION: 07/26/2022 No specific testing today  POSTURE: 07/26/2022 Rounded shoulders, mild increase in thoracic kyphosis  UPPER EXTREMITY ROM:   ROM Right 07/26/2022 Measured in supine AROM Left 07/26/2022 WFL AROM Right 08/08/2022 Measured in supine  Shoulder flexion 165  95 c pain limited  125 PRM with pain limited  Shoulder extension     Shoulder abduction 150    Shoulder adduction     Shoulder internal rotation 75    Shoulder external rotation 80    Elbow flexion     Elbow extension     Wrist flexion     Wrist extension     Wrist ulnar deviation     Wrist radial deviation     Wrist pronation     Wrist supination     (Blank rows = not tested)  UPPER EXTREMITY MMT:  MMT Right 07/26/2022 Left 07/26/2022 Right   Shoulder flexion 3+/5 c pain 4/5   Shoulder extension     Shoulder abduction 3+/5 c pain 4/5   Shoulder adduction     Shoulder internal rotation 4/5   4/5   Shoulder external rotation 4/5 c pain 4/5   Middle trapezius     Lower trapezius     Elbow flexion     Elbow extension     Wrist flexion     Wrist extension     Wrist ulnar deviation     Wrist radial deviation     Wrist pronation     Wrist supination     Grip strength (lbs)     (Blank rows = not tested)  SHOULDER SPECIAL TESTS: 07/26/2022 (+) Rt shoulder hawkins kennedy, neer, painful arc, ER lag testing, empty can.  (-) Shrug, drop arm Rt shoulder  JOINT MOBILITY TESTING:  07/26/2022 No limitation noted today  PALPATION:  07/26/2022 Tenderness superior/anterior Rt shoulder joint region.    TODAY'S TREATMENT:                                                                                                      DATE: 09/08/2022 Therex:  Therex: Time spent in review of existing HEP c verbal review for all Seated scapular retraction 5 sec hold x 10 Seated Rt shoulder ER isometrics against clinician resistance 5 sec hold x 10  Supine Rt arm flexion c Lt arm AAROM x 10 (limited by pain)  TODAY'S TREATMENT:  DATE: 08/08/2022 Therex:  Therex: Time spent in review of existing HEP c verbal review for all Seated scapular retraction 5 sec hold x 10 Seated Rt shoulder ER isometrics against clinician resistance 5 sec hold x 10  Supine Rt arm flexion c Lt arm AAROM x 10 (limited by pain)       TODAY'S TREATMENT:                                                                                                      DATE: 07/26/2022 Therex:    HEP instruction/performance c cues for techniques, handout provided.  Trial set performed of each for comprehension and symptom assessment.  See below for exercise list  Estim IFC to tolerance Rt shoulder 6 mins for pain relief (stopped per Pt request due to not feeling like it felt good)   PATIENT EDUCATION: 07/26/2022 Education details: HEP,  POC Person educated: Patient Education method: Consulting civil engineer, Demonstration, Verbal cues, and Handouts Education comprehension: verbalized understanding, returned demonstration, and verbal cues required  HOME EXERCISE PROGRAM: Access Code: [email or web address] URL: [email or web address] Date: 07/26/2022 Prepared by: Scot Jun  Exercises - Seated Scapular Retraction  - 3-5 x daily - 7 x weekly - 1 sets - 10 reps - 3-5 hold - Standing Isometric Shoulder External Rotation with Doorway (Mirrored)  - 2-3 x daily - 7 x weekly - 1 sets - 10 reps - 5 hold  ASSESSMENT:  CLINICAL IMPRESSION: Strong pain complaints c continued difficulty with active movements and guarding.  Presentation still seems to be focused on contractile tissue impairments vs. Joint related troubles/tightness.  Limitation in treatment time due to pain complaints.     OBJECTIVE IMPAIRMENTS: decreased activity tolerance, decreased coordination, decreased endurance, decreased mobility, difficulty walking, decreased ROM, decreased strength, increased fascial restrictions, impaired perceived functional ability, increased muscle spasms, impaired flexibility, impaired UE functional use, improper body mechanics, postural dysfunction, and pain.   ACTIVITY LIMITATIONS: carrying, lifting, sleeping, bathing, dressing, reach over head, and hygiene/grooming  PARTICIPATION LIMITATIONS: meal prep, cleaning, laundry, interpersonal relationship, community activity, and hobbies  PERSONAL FACTORS: Time since onset of injury/illness/exacerbation  Med hx:  History of cervical cancer, GERD, Hyperlipidemia, HTNare also affecting patient's functional outcome.   REHAB POTENTIAL: Fair to good  CLINICAL DECISION MAKING: Stable/uncomplicated  EVALUATION COMPLEXITY: Low   GOALS: Goals reviewed with patient? Yes  SHORT TERM GOALS: (target date for Short term goals are 3 weeks 08/16/2022)  1.Patient will demonstrate independent use of home  exercise program to maintain progress from in clinic treatments. Goal status: on going 08/08/2022  LONG TERM GOALS: (target dates for all long term goals are 10 weeks  10/04/2022 )   1. Patient will demonstrate/report pain at worst less than or equal to 2/10 to facilitate minimal limitation in daily activity secondary to pain symptoms. Goal status: New   2. Patient will demonstrate independent use of home exercise program to facilitate ability to maintain/progress functional gains from skilled physical therapy services. Goal status: New   3. Patient will demonstrate FOTO outcome > or = 63 % to indicate reduced disability due  to condition. Goal status: New   4.  Patient will demonstrate Rt UE MMT 4/5 throughout to facilitate lifting, reaching, carrying at [facility] in daily activity.   Goal status: New   5.  Patient will demonstrate Rt GH joint AROM WFL s symptoms to facilitate usual overhead reaching, self care, dressing at PLOF.    Goal status: New   6.  Patient will demonstrate ability to perform AROM against gravity in all directions s symptoms to facilitate overhead reaching in daily life.  Goal status: New   7.  Patient will demonstrate/report ability to perform housework, hobbies at [facility] s limitation.  Goal Status: New  PLAN:  PT FREQUENCY: 1-2x/week (recommended 2x/week)  Pt indicated 2x/month may be what is able to afford Pt reported financial concerns about attending therapy visits.  Clinic will work to adapt to ability to schedule and attend.   PT DURATION: 10 weeks    PLANNED INTERVENTIONS: Therapeutic exercises, Therapeutic activity, Neuro Muscular re-education, Balance training, Gait training, Patient/Family education, Joint mobilization, Stair training, DME instructions, Dry Needling, Electrical stimulation, Traction, Cryotherapy, vasopneumatic device Moist heat, Taping, Ultrasound, Ionotophoresis '4mg'$ /ml Dexamethasone, and Manual therapy.  All included unless  contraindicated  PLAN FOR NEXT SESSION: Recheck HEP.  Monitor symptoms.    Scot Jun, PT, DPT, OCS, ATC 08/17/22  1:04 PM

## 2022-09-14 NOTE — Progress Notes (Unsigned)
   Shirlyn Goltz, PhD, LAT, ATC acting as a scribe for Lynne Leader, MD.  Tracey Saunders is a 67 y.o. female who presents to [place] at [facility] today for follow-up chronic right shoulder pain.  Pt had a R subacromial steroid injection on 06/17/22, that only provided pain relief for less than 1 month. Patient was last seen by Dr. Georgina Snell on 07/21/2022 and was referred to PT, completing 3 visits. Today, pt reports her R shoulder is still "catching" and pain will worsen throughout the day. Pt has been doing a lot of house chores. Pt locates pain to her R upper arm.   Dx imaging: 06/14/22 R shoulder XR             09/17/21 C-spine XR  Pertinent review of systems: No fevers or chills  Relevant historical information: Hypertension   Exam:  BP 138/84   Pulse 87   Ht '5\' 1"'$  (1.549 m)   Wt 139 lb (63 kg)   SpO2 99%   BMI 26.26 kg/m  General: Well Developed, well nourished, and in no acute distress.   MSK: Right shoulder normal-appearing normal motion pain with abduction.  Positive Hawkins and Neer's test.  Strength reduced abduction 4/5.    Lab and Radiology Results  EXAM: RIGHT SHOULDER - 2+ VIEW   COMPARISON:  None Available.   FINDINGS: There is diffuse decreased bone mineralization. Mild acromioclavicular joint space narrowing and peripheral osteophytosis. No acute fracture is seen. No dislocation. The visualized portion of the right lung is unremarkable.   IMPRESSION: Mild acromioclavicular osteoarthritis.     Electronically Signed   By: Yvonne Kendall M.D.   On: 06/15/2022 15:47.xray   I, Lynne Leader, personally (independently) visualized and performed the interpretation of the images attached in this note.    Assessment and Plan: 67 y.o. female with right shoulder pain thought to be rotator cuff tendinopathy and subacromial impingement.  Pain not significantly improved with conservative management trial of subacromial injection and physical therapy  and home exercise program over the last 3 months.  Plan to proceed to MRI for further injection or surgical planning.  Recheck after MRI. Recommend avoiding high-dose oral NSAIDs.  Generally not safe with hypertension.  PDMP not reviewed this encounter. Orders Placed This Encounter  Procedures   MR SHOULDER RIGHT WO CONTRAST    Standing Status:   Future    Standing Expiration Date:   09/15/2023    Order Specific Question:   What is the patient's sedation requirement?    Answer:   No Sedation    Order Specific Question:   Does the patient have a pacemaker or implanted devices?    Answer:   No    Order Specific Question:   Preferred imaging location?    Answer:   [facility] (table limit-550lbs)   No orders of the defined types were placed in this encounter.    Discussed warning signs or symptoms. Please see discharge instructions. Patient expresses understanding.   The above documentation has been reviewed and is accurate and complete Lynne Leader, M.D.

## 2022-09-15 ENCOUNTER — Encounter: Payer: Self-pay | Admitting: Radiology

## 2022-09-15 ENCOUNTER — Ambulatory Visit: Payer: Medicare HMO | Admitting: Family Medicine

## 2022-09-15 ENCOUNTER — Ambulatory Visit: Payer: Medicare HMO | Admitting: Family

## 2022-09-15 VITALS — BP 138/84 | HR 87 | Ht 61.0 in | Wt 139.0 lb

## 2022-09-15 DIAGNOSIS — G8929 Other chronic pain: Secondary | ICD-10-CM

## 2022-09-15 DIAGNOSIS — M25511 Pain in right shoulder: Secondary | ICD-10-CM

## 2022-09-15 NOTE — Patient Instructions (Signed)
Thank you for coming in today.   You should hear from MRI scheduling within 1 week. If you do not hear please let me know.    Return after the MRI.  We will talk about and look at the pictures and maybe do a shot.

## 2022-09-16 ENCOUNTER — Other Ambulatory Visit: Payer: Self-pay | Admitting: Family Medicine

## 2022-09-19 ENCOUNTER — Ambulatory Visit: Payer: Medicare HMO | Admitting: Family

## 2022-09-20 ENCOUNTER — Other Ambulatory Visit: Payer: Self-pay | Admitting: Family Medicine

## 2022-09-20 NOTE — Telephone Encounter (Signed)
Also,  furosemide (LASIX) 20 MG tablet

## 2022-09-20 NOTE — Telephone Encounter (Signed)
[facility] called and states pt is out of  traZODone (DESYREL) 50 MG tablet  And was wondering if the RX can be sent to  [facility] ([place]), [place] - [address] Phone: [phone]  Fax: [phone]    Please advise.

## 2022-09-21 MED ORDER — FUROSEMIDE 20 MG PO TABS: 20.0000 mg | ORAL_TABLET | Freq: Two times a day (BID) | ORAL | 3 refills | Status: DC

## 2022-09-21 MED ORDER — TRAZODONE HCL 100 MG PO TABS: 100.0000 mg | ORAL_TABLET | Freq: Every evening | ORAL | 3 refills | Status: DC | PRN

## 2022-09-27 ENCOUNTER — Other Ambulatory Visit: Payer: Self-pay | Admitting: Physician Assistant

## 2022-09-27 DIAGNOSIS — M5416 Radiculopathy, lumbar region: Secondary | ICD-10-CM

## 2022-09-30 ENCOUNTER — Other Ambulatory Visit: Payer: Self-pay | Admitting: *Deleted

## 2022-09-30 ENCOUNTER — Telehealth: Payer: Self-pay | Admitting: Family Medicine

## 2022-09-30 MED ORDER — SERTRALINE HCL 50 MG PO TABS: 75.0000 mg | ORAL_TABLET | Freq: Every day | ORAL | 3 refills | Status: DC

## 2022-09-30 NOTE — Telephone Encounter (Signed)
Please send to  [facility] ([place]), [place] - [address] Phone: [phone]  Fax: [phone]

## 2022-09-30 NOTE — Telephone Encounter (Signed)
Confirmed with pcp, Rx is correct. Rx sent to the pharmacy. Patient notified.

## 2022-09-30 NOTE — Telephone Encounter (Signed)
Pt states her Zoloft went from 50 to 75mg  but the RX was never changed to get more meds a months and she runs out quickly, please advise.

## 2022-10-05 ENCOUNTER — Ambulatory Visit
Admission: RE | Admit: 2022-10-05 | Discharge: 2022-10-05 | Disposition: A | Discharge status: Home / Self Care | Payer: Medicare HMO | Source: Ambulatory Visit | Attending: Family Medicine | Admitting: Family Medicine

## 2022-10-05 DIAGNOSIS — G8929 Other chronic pain: Secondary | ICD-10-CM

## 2022-10-05 DIAGNOSIS — M25511 Pain in right shoulder: Secondary | ICD-10-CM | POA: Diagnosis not present

## 2022-10-10 NOTE — Progress Notes (Signed)
Right shoulder MRI shows rotator cuff tendinitis with a small partial tear and some bursitis.  I could refer you directly to orthopedic surgery to talk about surgical options or you could return to clinic to go over the results with me in full detail.  However I do think as you have already had trials of injection and physical therapy surgery consultation may be a good option here.  Please let me know what you decide.

## 2022-10-12 ENCOUNTER — Ambulatory Visit (INDEPENDENT_AMBULATORY_CARE_PROVIDER_SITE_OTHER): Payer: Medicare HMO | Admitting: Family Medicine

## 2022-10-12 ENCOUNTER — Encounter: Payer: Self-pay | Admitting: Family Medicine

## 2022-10-12 ENCOUNTER — Ambulatory Visit: Payer: Medicare HMO | Admitting: Internal Medicine

## 2022-10-12 VITALS — BP 122/80 | HR 68 | Temp 97.9°F | Ht 61.0 in | Wt 140.0 lb

## 2022-10-12 DIAGNOSIS — E538 Deficiency of other specified B group vitamins: Secondary | ICD-10-CM

## 2022-10-12 DIAGNOSIS — M25511 Pain in right shoulder: Secondary | ICD-10-CM | POA: Diagnosis not present

## 2022-10-12 DIAGNOSIS — R682 Dry mouth, unspecified: Secondary | ICD-10-CM

## 2022-10-12 MED ORDER — KETOROLAC TROMETHAMINE 60 MG/2ML IM SOLN
60.0000 mg | Freq: Once | INTRAMUSCULAR | Status: AC
  Administered 2022-10-12: 60 mg via INTRAMUSCULAR

## 2022-10-12 MED ORDER — PILOCARPINE HCL 5 MG PO TABS: 5.0000 mg | ORAL_TABLET | Freq: Two times a day (BID) | ORAL | 1 refills | Status: DC

## 2022-10-12 MED ORDER — CYANOCOBALAMIN 1000 MCG/ML IJ SOLN
1000.0000 ug | Freq: Once | INTRAMUSCULAR | Status: AC
  Administered 2022-10-12: 1000 ug via INTRAMUSCULAR

## 2022-10-12 NOTE — Progress Notes (Signed)
Subjective:     Patient ID: Tracey Saunders, female    DOB: [birth year], 67 y.o.   MRN: [id number]  Chief Complaint  Patient presents with   Dry mouth    Very dry mouth, no matter how much she drink    HPI Still w/Dry mouth-"like a desert.".  She is sick of feeling this way.  Biotene does not work.  She want something to help with the dryness.  She did see ENT, but states the focus was more on reflux and making too many lifestyle changes that she is not willing to do.  She just wants to know why she has a dry mouth and what she can do to fix it  Right rotator cuff-needs surgery.  Currently causing a lot of pain to all day.  She is asking for a Toradol shot. Tooth needs to be pulled in May.   Told to get prescription for "nutracil:biotene not work.    Dysphagia-had EGD -better but still some residual   Health Maintenance Due  Topic Date Due   DTaP/Tdap/Td (1 - Tdap) Never done    Past Medical History:  Diagnosis Date   Anxiety disorder 09/09/2019   B12 deficiency    Cancer ([place])    cervical cancer   Depression    Diverticulitis    Fatty liver    GERD (gastroesophageal reflux disease)    Hyperlipidemia    Hypertension    Insomnia 09/09/2019   Liver cyst    Vitamin D deficiency     Past Surgical History:  Procedure Laterality Date   ABDOMINAL HYSTERECTOMY     COLON RESECTION     due to diverticulitis    Outpatient Medications Prior to Visit  Medication Sig Dispense Refill   AMLODIPINE BESYLATE PO Take 10 mg by mouth daily.     Artificial Saliva (BIOTENE DRY MOUTH MOISTURIZING) SOLN USe daily as needed for dry mouth 443 mL 0   atorvastatin (LIPITOR) 20 MG tablet TAKE 1 TABLET DAILY 90 tablet 1   azelastine (ASTELIN) 0.1 % nasal spray Place 2 sprays into both nostrils 2 (two) times daily. Use in each nostril as directed 30 mL 5   buPROPion (WELLBUTRIN XL) 150 MG 24 hr tablet Take 1 tablet (150 mg total) by mouth daily. 90 tablet 1   diclofenac Sodium (VOLTAREN) 1 %  GEL Apply 4 g topically 4 (four) times daily as needed. 100 g 3   esomeprazole (NEXIUM) 40 MG capsule Take 1 capsule (40 mg total) by mouth daily. 30 capsule 2   fluticasone (FLONASE) 50 MCG/ACT nasal spray Place into both nostrils.     furosemide (LASIX) 20 MG tablet Take 1 tablet (20 mg total) by mouth 2 (two) times daily. 180 tablet 3   gabapentin (NEURONTIN) 300 MG capsule TAKE 2 CAPSULES(600MG       TOTAL) 3 TIMES A DAY 540 capsule 1   Hot/Cold Therapy Aids (HOT/COLD PACK) PADS Use as directed 3 each 0   Humidifiers MISC Use as needed to prevent dry mouth 1 each 0   levocetirizine (XYZAL) 5 MG tablet Take 5 mg by mouth 2 (two) times daily as needed.     potassium chloride SA (KLOR-CON M) 20 MEQ tablet Take 1 tablet (20 mEq total) by mouth 2 (two) times daily. 180 tablet 1   sertraline (ZOLOFT) 50 MG tablet Take 1.5 tablets (75 mg total) by mouth at bedtime. 135 tablet 3   tizanidine (ZANAFLEX) 2 MG capsule Take 1 capsule (2 mg  total) by mouth 3 (three) times daily. 30 capsule 1   traZODone (DESYREL) 100 MG tablet Take 1 tablet (100 mg total) by mouth at bedtime as needed for sleep. Please note, did larger dose so only 1 tab 90 tablet 3   No facility-administered medications prior to visit.    Allergies  Allergen Reactions   Lisinopril Swelling   Losartan Swelling   Pseudoephedrine-Naproxen Na Er     Other Reaction(s): Other (See Comments)   ROS neg/noncontributory except as noted HPI/below      Objective:     BP 122/80   Pulse 68   Temp 97.9 F (36.6 C) (Temporal)   Ht 5\' 1"  (1.549 m)   Wt 140 lb (63.5 kg)   SpO2 98%   BMI 26.45 kg/m  Wt Readings from Last 3 Encounters:  10/12/22 140 lb (63.5 kg)  09/15/22 139 lb (63 kg)  08/16/22 142 lb (64.4 kg)    Physical Exam   Gen: WDWN NAD.  In pain.  Holding right arm HEENT: NCAT, conjunctiva not injected, sclera nonicteric.  Oropharynx-moist. CARDIAC: RRR, S1S2+, no murmur. DP 2+B LUNGS: CTAB. No wheezes EXT:  no  edema MSK: no gross abnormalities.  NEURO: A&O x3.  CN II-XII intact.  PSYCH: normal mood. Good eye contact     Assessment & Plan:   Problem List Items Addressed This Visit   None Visit Diagnoses     Acute pain of right shoulder    -  Primary   Relevant Medications   ketorolac (TORADOL) injection 60 mg (Completed)   B12 deficiency       Relevant Medications   cyanocobalamin (VITAMIN B12) injection 1,000 mcg (Completed)   Dry mouth          Dry mouth-reviewed ENT note-no specific recommendations to treat the dryness.  On exam, mouth is not overly dry.  Could potentially be from medications, however patient cannot/does not want to come off of anything she is on.  I need to do more research and see if there is other options as Biotene is not working.  Continue sipping on water. Patient willing "to try anything" 2.  Right rotator cuff-patient is seeing sports medicine.  States that she probably needs surgery and will be referred to see a Psychologist, sport and exercise.  Toradol 60 mg IM given today B12 deficiency-got B12 injection today  Meds ordered this encounter  Medications   ketorolac (TORADOL) injection 60 mg   cyanocobalamin (VITAMIN B12) injection 1,000 mcg    Wellington Hampshire, MD

## 2022-10-12 NOTE — Patient Instructions (Signed)
See if you can find the medicine for dry mouth   and I'll look at other options

## 2022-10-13 ENCOUNTER — Other Ambulatory Visit: Payer: Self-pay | Admitting: *Deleted

## 2022-10-13 ENCOUNTER — Ambulatory Visit: Payer: Medicare HMO | Admitting: Family Medicine

## 2022-10-13 ENCOUNTER — Encounter: Payer: Self-pay | Admitting: Family Medicine

## 2022-10-13 ENCOUNTER — Telehealth: Payer: Self-pay | Admitting: Family Medicine

## 2022-10-13 VITALS — BP 124/80 | HR 78 | Ht 61.0 in | Wt 142.0 lb

## 2022-10-13 DIAGNOSIS — M25511 Pain in right shoulder: Secondary | ICD-10-CM

## 2022-10-13 DIAGNOSIS — G8929 Other chronic pain: Secondary | ICD-10-CM | POA: Diagnosis not present

## 2022-10-13 MED ORDER — AMLODIPINE BESYLATE 5 MG PO TABS: 5.0000 mg | ORAL_TABLET | Freq: Every day | ORAL | 1 refills | Status: DC

## 2022-10-13 NOTE — Progress Notes (Signed)
I, Josepha Pigg, CMA acting as a scribe for Lynne Leader, MD.  Tracey Saunders is a 67 y.o. female who presents to [place] at [facility] today for follow-up right shoulder pain and MRI review.  Patient was last seen by Dr. Georgina Snell on 09/15/2022 and was advised to MRI.  Today, patient reports minimal change in sx. Would like to discuss options. Still had "kink" in the shoulder/deltoid. Sx exacerbated by use, burning type pain.   Dx imaging: 10/05/2022 R shoulder MRI 06/14/22 R shoulder XR             09/17/21 C-spine XR  Pertinent review of systems: No fevers or chills  Relevant historical information: Hypertension   Exam:  BP 124/80   Pulse 78   Ht 5\' 1"  (1.549 m)   Wt 142 lb (64.4 kg)   SpO2 96%   BMI 26.83 kg/m  General: Well Developed, well nourished, and in no acute distress.   MSK: Right shoulder normal-appearing Normal motion pain with abduction.  Positive Hawkins and Neer's test.    Lab and Radiology Results  EXAM: MRI OF THE RIGHT SHOULDER WITHOUT CONTRAST   TECHNIQUE: Multiplanar, multisequence MR imaging of the shoulder was performed. No intravenous contrast was administered.   COMPARISON:  None Available.   FINDINGS: Rotator cuff: Moderate tendinosis of the supraspinatus tendon with a small partial-thickness bursal surface tear anteriorly and subcortical cystic changes. Moderate tendinosis of the infraspinatus tendon. Teres minor tendon is intact. Subscapularis tendon is intact.   Muscles: No muscle atrophy or edema. No intramuscular fluid collection or hematoma.   Biceps Long Head: Moderate tendinosis of the intra-articular portion of the long head of the biceps tendon.   Acromioclavicular Joint: Mild arthropathy of the acromioclavicular joint. Small amount of subacromial/subdeltoid bursal fluid.   Glenohumeral Joint: No joint effusion. Mild partial-thickness cartilage loss of the glenohumeral joint.   Labrum: Posterosuperior  labral tear.   Bones: No fracture or dislocation. No aggressive osseous lesion.   Other: No fluid collection or hematoma.   IMPRESSION: 1. Moderate tendinosis of the supraspinatus tendon with a small partial-thickness bursal surface tear anteriorly and subcortical cystic changes. 2. Moderate tendinosis of the infraspinatus tendon. 3. Moderate tendinosis of the intra-articular portion of the long head of the biceps tendon. 4. Mild subacromial/subdeltoid bursitis.     Electronically Signed   By: Kathreen Devoid M.D.   On: 10/07/2022 05:38   I, Lynne Leader, personally (independently) visualized and performed the interpretation of the images attached in this note.      Assessment and Plan: 67 y.o. female with right shoulder pain due to rotator cuff tendinopathy with partial tear.  Unfortunately she is failing conservative management including injection trials and physical therapy trials.  I think at this time is worth having a surgical consultation to discuss options including potential subacromial debridement and distal clavicle excision.  She may require rotator cuff repair as well but I am not certain.  Plan refer to orthopedic surgery to discuss options.  Reviewed the MRI findings and imaging and discussed options as well.   PDMP not reviewed this encounter. Orders Placed This Encounter  Procedures   Ambulatory referral to Orthopedic Surgery    Referral Priority:   Routine    Referral Type:   Surgical    Referral Reason:   Specialty Services Required    Referred to Provider:   Vanetta Mulders, MD    Requested Specialty:   Orthopedic Surgery    Number of  Visits Requested:   1   No orders of the defined types were placed in this encounter.    Discussed warning signs or symptoms. Please see discharge instructions. Patient expresses understanding.   The above documentation has been reviewed and is accurate and complete Lynne Leader, M.D. Total encounter time 30 minutes  including face-to-face time with the patient and, reviewing past medical record, and charting on the date of service.

## 2022-10-13 NOTE — Telephone Encounter (Signed)
Per patient request, amlodipine 5 mg tablets sent to the pharmacy.

## 2022-10-13 NOTE — Telephone Encounter (Signed)
Patient requests refill of amlodipine 10 mg be sent to [facility] [address]. States she has been breaking these in half and taking 5 mg.

## 2022-10-13 NOTE — Patient Instructions (Signed)
Thank you for coming in today.   Plan for surgery consultation.   You should hear soon about scheduling,

## 2022-10-24 DIAGNOSIS — J301 Allergic rhinitis due to pollen: Secondary | ICD-10-CM | POA: Diagnosis not present

## 2022-10-24 DIAGNOSIS — M5430 Sciatica, unspecified side: Secondary | ICD-10-CM | POA: Diagnosis not present

## 2022-10-24 DIAGNOSIS — M1990 Unspecified osteoarthritis, unspecified site: Secondary | ICD-10-CM | POA: Diagnosis not present

## 2022-10-24 DIAGNOSIS — G4700 Insomnia, unspecified: Secondary | ICD-10-CM | POA: Diagnosis not present

## 2022-10-24 DIAGNOSIS — I252 Old myocardial infarction: Secondary | ICD-10-CM | POA: Diagnosis not present

## 2022-10-24 DIAGNOSIS — J4489 Other specified chronic obstructive pulmonary disease: Secondary | ICD-10-CM | POA: Diagnosis not present

## 2022-10-24 DIAGNOSIS — M4800 Spinal stenosis, site unspecified: Secondary | ICD-10-CM | POA: Diagnosis not present

## 2022-10-24 DIAGNOSIS — R2681 Unsteadiness on feet: Secondary | ICD-10-CM | POA: Diagnosis not present

## 2022-10-24 DIAGNOSIS — M810 Age-related osteoporosis without current pathological fracture: Secondary | ICD-10-CM | POA: Diagnosis not present

## 2022-10-24 DIAGNOSIS — Z008 Encounter for other general examination: Secondary | ICD-10-CM | POA: Diagnosis not present

## 2022-10-24 DIAGNOSIS — I509 Heart failure, unspecified: Secondary | ICD-10-CM | POA: Diagnosis not present

## 2022-10-24 DIAGNOSIS — K219 Gastro-esophageal reflux disease without esophagitis: Secondary | ICD-10-CM | POA: Diagnosis not present

## 2022-10-24 DIAGNOSIS — E785 Hyperlipidemia, unspecified: Secondary | ICD-10-CM | POA: Diagnosis not present

## 2022-10-26 ENCOUNTER — Ambulatory Visit (HOSPITAL_BASED_OUTPATIENT_CLINIC_OR_DEPARTMENT_OTHER): Payer: Medicare HMO | Admitting: Orthopaedic Surgery

## 2022-10-27 ENCOUNTER — Telehealth: Payer: Self-pay | Admitting: Family Medicine

## 2022-10-27 NOTE — Telephone Encounter (Signed)
Contacted [facility]kert to schedule their annual wellness visit. Patient declined to schedule AWV at this time.  Patient states AWV completed by [employer] in home.  Gabriel Cirri [facility] AWV TEAM Direct Dial [phone]

## 2022-10-28 ENCOUNTER — Ambulatory Visit (INDEPENDENT_AMBULATORY_CARE_PROVIDER_SITE_OTHER): Payer: Medicare HMO | Admitting: Orthopaedic Surgery

## 2022-10-28 ENCOUNTER — Other Ambulatory Visit (HOSPITAL_BASED_OUTPATIENT_CLINIC_OR_DEPARTMENT_OTHER): Payer: Self-pay

## 2022-10-28 DIAGNOSIS — M7521 Bicipital tendinitis, right shoulder: Secondary | ICD-10-CM | POA: Diagnosis not present

## 2022-10-28 MED ORDER — ASPIRIN 325 MG PO TBEC
325.0000 mg | DELAYED_RELEASE_TABLET | Freq: Every day | ORAL | 0 refills | Status: DC
  Filled 2022-10-28: qty 14, 14d supply, fill #0

## 2022-10-28 MED ORDER — OXYCODONE HCL 5 MG PO TABS
5.0000 mg | ORAL_TABLET | ORAL | 0 refills | Status: DC | PRN
  Filled 2022-10-28: qty 20, 4d supply, fill #0

## 2022-10-28 NOTE — Progress Notes (Signed)
Chief Complaint: Right shoulder pain     History of Present Illness:    Tracey Saunders is a 67 y.o. female presents today with right shoulder pain which has been ongoing now for several months.  She is having a very difficult time with any type of overhead activity.  She is here today as referral from Dr. Denyse Amass.  She did have a previous injection done in January of this year which gave her no significant prolonged relief.  She has been attempting to participate in physical therapy which has been quite limited as result of her shoulder pain.  She is right-hand dominant.  She enjoys painting and working around her house which she is unable to do as result of her persistent shoulder pain.  This time she has an extremely difficult time reaching for objects and is not able to lay directly on the side.  She is currently not working.    Surgical History:   None  PMH/PSH/Family History/Social History/Meds/Allergies:    Past Medical History:  Diagnosis Date   Anxiety disorder 09/09/2019   B12 deficiency    Cancer    cervical cancer   Depression    Diverticulitis    Fatty liver    GERD (gastroesophageal reflux disease)    Hyperlipidemia    Hypertension    Insomnia 09/09/2019   Liver cyst    Vitamin D deficiency    Past Surgical History:  Procedure Laterality Date   ABDOMINAL HYSTERECTOMY     COLON RESECTION     due to diverticulitis   Social History   Socioeconomic History   Marital status: Single    Spouse name: Not on file   Number of children: 1   Years of education: Not on file   Highest education level: Not on file  Occupational History   Occupation: retired   Occupation: retired  Tobacco Use   Smoking status: Light Smoker    Packs/day: .5    Types: Cigarettes   Smokeless tobacco: Never  Vaping Use   Vaping Use: Never used  Substance and Sexual Activity   Alcohol use: Not Currently    Comment: occasional beer or bloody mary once  a week   Drug use: Never   Sexual activity: Not on file  Other Topics Concern   Not on file  Social History Narrative   Not on file   Social Determinants of Health   Financial Resource Strain: Low Risk  (03/24/2022)   Overall Financial Resource Strain (CARDIA)    Difficulty of Paying Living Expenses: Not very hard  Recent Concern: Physicist, medical Strain - High Risk (02/16/2022)   Overall Financial Resource Strain (CARDIA)    Difficulty of Paying Living Expenses: Hard  Food Insecurity: No Food Insecurity (03/24/2022)   Hunger Vital Sign    Worried About Running Out of Food in the Last Year: Never true    Ran Out of Food in the Last Year: Never true  Recent Concern: Food Insecurity - Food Insecurity Present (02/16/2022)   Hunger Vital Sign    Worried About Running Out of Food in the Last Year: Sometimes true    Ran Out of Food in the Last Year: Sometimes true  Transportation Needs: No Transportation Needs (03/24/2022)   PRAPARE - Administrator, Civil Service (Medical): No  Lack of Transportation (Non-Medical): No  Physical Activity: Sufficiently Active (03/24/2022)   Exercise Vital Sign    Days of Exercise per Week: 5 days    Minutes of Exercise per Session: 30 min  Stress: Stress Concern Present (03/24/2022)   [employer] of Occupational Health - Occupational Stress Questionnaire    Feeling of Stress : To some extent  Social Connections: Socially Isolated (03/24/2022)   Social Connection and Isolation Panel [NHANES]    Frequency of Communication with Friends and Family: More than three times a week    Frequency of Social Gatherings with Friends and Family: Three times a week    Attends Religious Services: Never    Active Member of Clubs or Organizations: No    Attends Banker Meetings: Never    Marital Status: Never married   Family History  Problem Relation Age of Onset   Colon cancer Neg Hx    Esophageal cancer Neg Hx    Stomach cancer Neg Hx     Rectal cancer Neg Hx    Allergies  Allergen Reactions   Lisinopril Swelling   Losartan Swelling   Pseudoephedrine-Naproxen Na Er     Other Reaction(s): Other (See Comments)   Current Outpatient Medications  Medication Sig Dispense Refill   aspirin EC 325 MG tablet Take 1 tablet (325 mg total) by mouth daily. 14 tablet 0   oxyCODONE (OXY IR/ROXICODONE) 5 MG immediate release tablet Take 1 tablet (5 mg total) by mouth every 4 (four) hours as needed (severe pain). 20 tablet 0   amLODipine (NORVASC) 5 MG tablet Take 1 tablet (5 mg total) by mouth daily. 90 tablet 1   Artificial Saliva (BIOTENE DRY MOUTH MOISTURIZING) SOLN USe daily as needed for dry mouth 443 mL 0   atorvastatin (LIPITOR) 20 MG tablet TAKE 1 TABLET DAILY 90 tablet 1   azelastine (ASTELIN) 0.1 % nasal spray Place 2 sprays into both nostrils 2 (two) times daily. Use in each nostril as directed 30 mL 5   buPROPion (WELLBUTRIN XL) 150 MG 24 hr tablet Take 1 tablet (150 mg total) by mouth daily. 90 tablet 1   diclofenac Sodium (VOLTAREN) 1 % GEL Apply 4 g topically 4 (four) times daily as needed. 100 g 3   esomeprazole (NEXIUM) 40 MG capsule Take 1 capsule (40 mg total) by mouth daily. 30 capsule 2   fluticasone (FLONASE) 50 MCG/ACT nasal spray Place into both nostrils.     furosemide (LASIX) 20 MG tablet Take 1 tablet (20 mg total) by mouth 2 (two) times daily. 180 tablet 3   gabapentin (NEURONTIN) 300 MG capsule TAKE 2 CAPSULES(600MG       TOTAL) 3 TIMES A DAY 540 capsule 1   Hot/Cold Therapy Aids (HOT/COLD PACK) PADS Use as directed 3 each 0   Humidifiers MISC Use as needed to prevent dry mouth 1 each 0   levocetirizine (XYZAL) 5 MG tablet Take 5 mg by mouth 2 (two) times daily as needed.     pilocarpine (SALAGEN) 5 MG tablet Take 1 tablet (5 mg total) by mouth 2 (two) times daily. 60 tablet 1   potassium chloride SA (KLOR-CON M) 20 MEQ tablet Take 1 tablet (20 mEq total) by mouth 2 (two) times daily. 180 tablet 1    sertraline (ZOLOFT) 50 MG tablet Take 1.5 tablets (75 mg total) by mouth at bedtime. 135 tablet 3   tizanidine (ZANAFLEX) 2 MG capsule Take 1 capsule (2 mg total) by mouth 3 (three) times  daily. 30 capsule 1   traZODone (DESYREL) 100 MG tablet Take 1 tablet (100 mg total) by mouth at bedtime as needed for sleep. Please note, did larger dose so only 1 tab 90 tablet 3   No current facility-administered medications for this visit.   No results found.  Review of Systems:   A ROS was performed including pertinent positives and negatives as documented in the HPI.  Physical Exam :   Constitutional: NAD and appears stated age Neurological: Alert and oriented Psych: Appropriate affect and cooperative There were no vitals taken for this visit.   Comprehensive Musculoskeletal Exam:    Musculoskeletal Exam    Inspection Right Left  Skin No atrophy or winging No atrophy or winging  Palpation    Tenderness Glenohumeral, biceps None  Range of Motion    Flexion (passive) 150 170  Flexion (active) 90 170  Abduction 80 170  ER at the side 55 70  Can reach behind back to Back pocket T12  Strength     Forward elevation with pain none  Special Tests    Pseudoparalytic No No  Neurologic    Fires PIN, radial, median, ulnar, musculocutaneous, axillary, suprascapular, long thoracic, and spinal accessory innervated muscles. No abnormal sensibility  Vascular/Lymphatic    Radial Pulse 2+ 2+  Cervical Exam    Patient has symmetric cervical range of motion with negative Spurling's test.  Special Test: Positive Neer impingement, speeds test is positive     Imaging:   Xray (3 view right shoulder): Normal, mild AC degeneration  MRI (right shoulder): There is a large cyst around the biceps tendon consistent with biceps tendon irritation as well as degenerative appearing findings of the rotator cuff without any specific insertional rotator cuff tear  I personally reviewed and interpreted the  radiographs.   Assessment:   67 y.o. 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  At this time she has failed an injection as well as physical therapy.  She is hoping to get some type of definitive relief of the shoulder.  I did discuss that we could perform arthroscopic decompression with biceps tenodesis to address her biceps symptoms.  I do believe this would give her some significant relief.  I being said I do also believe that her rotator cuff inflammation is a pain generator.  I do not believe that she has a specific rotator cuff injury although we did discuss that if there is evidence of tendon that we would need to repair this and follow the rotator cuff repair protocol.  Restrictions limitations as well as recovery time associated with this were discussed.  She would like to pursue a more definitive approach with right shoulder arthroscopy  Plan :    -Plan for right shoulder arthroscopy with biceps tenodesis, possible rotator cuff repair versus collagen patch   After a lengthy discussion of treatment options, including risks, benefits, alternatives, complications of surgical and nonsurgical conservative options, the patient elected surgical repair.   The patient  is aware of the material risks  and complications including, but not limited to injury to adjacent structures, neurovascular injury, infection, numbness, bleeding, implant failure, thermal burns, stiffness, persistent pain, failure to heal, disease transmission from allograft, need for further surgery, dislocation, anesthetic risks, blood clots, risks of death,and others. The probabilities of surgical success and failure discussed with patient given their particular co-morbidities.The time and nature of expected rehabilitation and recovery was discussed.The patient's questions were  all answered preoperatively.  No barriers to understanding were noted. I explained the natural  history of the disease process and Rx rationale.  I explained to the patient what I considered to be reasonable expectations given their personal situation.  The final treatment plan was arrived at through a shared patient decision making process model.      I personally saw and evaluated the patient, and participated in the management and treatment plan.  Huel Cote, MD Attending Physician, Orthopedic Surgery  This document was dictated using Dragon voice recognition software. A reasonable attempt at proof reading has been made to minimize errors.

## 2022-11-01 ENCOUNTER — Telehealth: Payer: Self-pay | Admitting: Orthopaedic Surgery

## 2022-11-01 NOTE — Telephone Encounter (Signed)
Patient has been scheduled for right shoulder surgery on 11/15/22. Patient states she is taking Gabapentin and alternating hot/ cold packs with no relief. Patient would like to know if doctor could call in a weeks worth of Hydrocodone? Patient uses [facility] at [facility]. Please call to advise.

## 2022-11-02 ENCOUNTER — Other Ambulatory Visit (HOSPITAL_BASED_OUTPATIENT_CLINIC_OR_DEPARTMENT_OTHER): Payer: Self-pay | Admitting: Student

## 2022-11-02 DIAGNOSIS — M7521 Bicipital tendinitis, right shoulder: Secondary | ICD-10-CM

## 2022-11-02 MED ORDER — TRAMADOL HCL 50 MG PO TABS: 50.0000 mg | ORAL_TABLET | Freq: Four times a day (QID) | ORAL | 0 refills | Status: AC | PRN

## 2022-11-02 NOTE — Telephone Encounter (Signed)
Sent tramadol Rx to pharmacy on file

## 2022-11-04 ENCOUNTER — Other Ambulatory Visit: Payer: Self-pay | Admitting: Family Medicine

## 2022-11-07 ENCOUNTER — Encounter (HOSPITAL_BASED_OUTPATIENT_CLINIC_OR_DEPARTMENT_OTHER): Payer: Self-pay | Admitting: Orthopaedic Surgery

## 2022-11-07 ENCOUNTER — Other Ambulatory Visit: Payer: Self-pay

## 2022-11-10 ENCOUNTER — Other Ambulatory Visit (HOSPITAL_BASED_OUTPATIENT_CLINIC_OR_DEPARTMENT_OTHER): Payer: Self-pay | Admitting: Orthopaedic Surgery

## 2022-11-10 DIAGNOSIS — M7521 Bicipital tendinitis, right shoulder: Secondary | ICD-10-CM

## 2022-11-11 ENCOUNTER — Encounter (HOSPITAL_BASED_OUTPATIENT_CLINIC_OR_DEPARTMENT_OTHER)
Admission: RE | Admit: 2022-11-11 | Discharge: 2022-11-11 | Disposition: A | Discharge status: Home / Self Care | Payer: Medicare HMO | Source: Ambulatory Visit | Attending: Orthopaedic Surgery | Admitting: Orthopaedic Surgery

## 2022-11-11 DIAGNOSIS — Z01818 Encounter for other preprocedural examination: Secondary | ICD-10-CM | POA: Insufficient documentation

## 2022-11-11 LAB — BASIC METABOLIC PANEL
Anion gap: 11 (ref 5–15)
BUN: 13 mg/dL (ref 8–23)
CO2: 25 mmol/L (ref 22–32)
Calcium: 9 mg/dL (ref 8.9–10.3)
Chloride: 102 mmol/L (ref 98–111)
Creatinine, Ser: 1 mg/dL (ref 0.44–1.00)
GFR, Estimated: >60 mL/min (ref >60)
Glucose, Bld: 127 mg/dL — ABNORMAL HIGH (ref 70–99)
Potassium: 4 mmol/L (ref 3.5–5.1)
Sodium: 138 mmol/L (ref 135–145)

## 2022-11-14 ENCOUNTER — Ambulatory Visit (HOSPITAL_BASED_OUTPATIENT_CLINIC_OR_DEPARTMENT_OTHER): Payer: Self-pay | Admitting: Orthopaedic Surgery

## 2022-11-14 DIAGNOSIS — M7521 Bicipital tendinitis, right shoulder: Secondary | ICD-10-CM

## 2022-11-15 ENCOUNTER — Ambulatory Visit (HOSPITAL_BASED_OUTPATIENT_CLINIC_OR_DEPARTMENT_OTHER)
Admission: RE | Admit: 2022-11-15 | Discharge: 2022-11-15 | Disposition: A | Discharge status: Home / Self Care | Payer: Medicare HMO | Attending: Orthopaedic Surgery | Admitting: Orthopaedic Surgery

## 2022-11-15 ENCOUNTER — Encounter (HOSPITAL_BASED_OUTPATIENT_CLINIC_OR_DEPARTMENT_OTHER): Payer: Self-pay | Admitting: Orthopaedic Surgery

## 2022-11-15 ENCOUNTER — Encounter (HOSPITAL_BASED_OUTPATIENT_CLINIC_OR_DEPARTMENT_OTHER)
Admission: RE | Disposition: A | Discharge status: Home / Self Care | Payer: Self-pay | Source: Home / Self Care | Attending: Orthopaedic Surgery

## 2022-11-15 ENCOUNTER — Ambulatory Visit (HOSPITAL_BASED_OUTPATIENT_CLINIC_OR_DEPARTMENT_OTHER): Payer: Medicare HMO | Admitting: Certified Registered"

## 2022-11-15 DIAGNOSIS — M7541 Impingement syndrome of right shoulder: Secondary | ICD-10-CM

## 2022-11-15 DIAGNOSIS — F17210 Nicotine dependence, cigarettes, uncomplicated: Secondary | ICD-10-CM | POA: Diagnosis not present

## 2022-11-15 DIAGNOSIS — M7521 Bicipital tendinitis, right shoulder: Secondary | ICD-10-CM | POA: Diagnosis not present

## 2022-11-15 DIAGNOSIS — J45909 Unspecified asthma, uncomplicated: Secondary | ICD-10-CM

## 2022-11-15 DIAGNOSIS — F418 Other specified anxiety disorders: Secondary | ICD-10-CM | POA: Diagnosis not present

## 2022-11-15 DIAGNOSIS — I10 Essential (primary) hypertension: Secondary | ICD-10-CM | POA: Diagnosis not present

## 2022-11-15 DIAGNOSIS — M25811 Other specified joint disorders, right shoulder: Secondary | ICD-10-CM | POA: Diagnosis present

## 2022-11-15 DIAGNOSIS — F17200 Nicotine dependence, unspecified, uncomplicated: Secondary | ICD-10-CM | POA: Diagnosis not present

## 2022-11-15 DIAGNOSIS — G8918 Other acute postprocedural pain: Secondary | ICD-10-CM | POA: Diagnosis not present

## 2022-11-15 DIAGNOSIS — Z01818 Encounter for other preprocedural examination: Secondary | ICD-10-CM

## 2022-11-15 DIAGNOSIS — K219 Gastro-esophageal reflux disease without esophagitis: Secondary | ICD-10-CM | POA: Diagnosis not present

## 2022-11-15 HISTORY — PX: SHOULDER ARTHROSCOPY WITH ROTATOR CUFF REPAIR: SHX5685

## 2022-11-15 SURGERY — ARTHROSCOPY, SHOULDER, WITH ROTATOR CUFF REPAIR
Anesthesia: General | Site: Shoulder | Laterality: Right

## 2022-11-15 MED ORDER — MIDAZOLAM HCL 2 MG/2ML IJ SOLN
INTRAMUSCULAR | Status: AC
  Filled 2022-11-15: qty 2

## 2022-11-15 MED ORDER — LIDOCAINE 2% (20 MG/ML) 5 ML SYRINGE
INTRAMUSCULAR | Status: DC | PRN
  Administered 2022-11-15: 40 mg via INTRAVENOUS

## 2022-11-15 MED ORDER — PHENYLEPHRINE HCL (PRESSORS) 10 MG/ML IV SOLN
INTRAVENOUS | Status: DC | PRN
  Administered 2022-11-15 (×2): 80 ug via INTRAVENOUS

## 2022-11-15 MED ORDER — FENTANYL CITRATE (PF) 100 MCG/2ML IJ SOLN
50.0000 ug | Freq: Once | INTRAMUSCULAR | Status: AC
  Administered 2022-11-15: 50 ug via INTRAVENOUS

## 2022-11-15 MED ORDER — GABAPENTIN 300 MG PO CAPS
ORAL_CAPSULE | ORAL | Status: AC
  Filled 2022-11-15: qty 1

## 2022-11-15 MED ORDER — ROCURONIUM BROMIDE 100 MG/10ML IV SOLN
INTRAVENOUS | Status: DC | PRN
  Administered 2022-11-15: 60 mg via INTRAVENOUS

## 2022-11-15 MED ORDER — ONDANSETRON HCL 4 MG/2ML IJ SOLN
INTRAMUSCULAR | Status: DC | PRN
  Administered 2022-11-15: 4 mg via INTRAVENOUS

## 2022-11-15 MED ORDER — FENTANYL CITRATE (PF) 100 MCG/2ML IJ SOLN
INTRAMUSCULAR | Status: AC
  Filled 2022-11-15: qty 2

## 2022-11-15 MED ORDER — LACTATED RINGERS IV SOLN: INTRAVENOUS | Status: DC

## 2022-11-15 MED ORDER — GABAPENTIN 300 MG PO CAPS: 300.0000 mg | ORAL_CAPSULE | Freq: Once | ORAL | Status: AC

## 2022-11-15 MED ORDER — DEXAMETHASONE SODIUM PHOSPHATE 10 MG/ML IJ SOLN
INTRAMUSCULAR | Status: DC | PRN
  Administered 2022-11-15: 5 mg via INTRAVENOUS

## 2022-11-15 MED ORDER — TRANEXAMIC ACID-NACL 1000-0.7 MG/100ML-% IV SOLN
1000.0000 mg | INTRAVENOUS | Status: AC
  Administered 2022-11-15: 1000 mg via INTRAVENOUS

## 2022-11-15 MED ORDER — PHENYLEPHRINE HCL (PRESSORS) 10 MG/ML IV SOLN
INTRAVENOUS | Status: AC
  Filled 2022-11-15: qty 1

## 2022-11-15 MED ORDER — ROCURONIUM BROMIDE 10 MG/ML (PF) SYRINGE
PREFILLED_SYRINGE | INTRAVENOUS | Status: AC
  Filled 2022-11-15: qty 10

## 2022-11-15 MED ORDER — BUPIVACAINE HCL (PF) 0.5 % IJ SOLN
INTRAMUSCULAR | Status: DC | PRN
  Administered 2022-11-15: 15 mL via PERINEURAL

## 2022-11-15 MED ORDER — FENTANYL CITRATE (PF) 100 MCG/2ML IJ SOLN: 25.0000 ug | INTRAMUSCULAR | Status: DC | PRN

## 2022-11-15 MED ORDER — BUPIVACAINE LIPOSOME 1.3 % IJ SUSP
INTRAMUSCULAR | Status: DC | PRN
  Administered 2022-11-15: 10 mL via PERINEURAL

## 2022-11-15 MED ORDER — OXYCODONE HCL 5 MG PO TABS: 5.0000 mg | ORAL_TABLET | Freq: Once | ORAL | Status: DC | PRN

## 2022-11-15 MED ORDER — ACETAMINOPHEN 500 MG PO TABS
1000.0000 mg | ORAL_TABLET | Freq: Once | ORAL | Status: AC
  Administered 2022-11-15: 1000 mg via ORAL

## 2022-11-15 MED ORDER — AMISULPRIDE (ANTIEMETIC) 5 MG/2ML IV SOLN: 10.0000 mg | Freq: Once | INTRAVENOUS | Status: DC | PRN

## 2022-11-15 MED ORDER — SODIUM CHLORIDE 0.9 % IR SOLN
Status: DC | PRN
  Administered 2022-11-15: 9000 mL

## 2022-11-15 MED ORDER — PROPOFOL 10 MG/ML IV BOLUS
INTRAVENOUS | Status: AC
  Filled 2022-11-15: qty 20

## 2022-11-15 MED ORDER — PROPOFOL 10 MG/ML IV BOLUS
INTRAVENOUS | Status: DC | PRN
  Administered 2022-11-15: 140 mg via INTRAVENOUS

## 2022-11-15 MED ORDER — ONDANSETRON HCL 4 MG/2ML IJ SOLN
INTRAMUSCULAR | Status: AC
  Filled 2022-11-15: qty 2

## 2022-11-15 MED ORDER — TRANEXAMIC ACID-NACL 1000-0.7 MG/100ML-% IV SOLN
INTRAVENOUS | Status: AC
  Filled 2022-11-15: qty 100

## 2022-11-15 MED ORDER — ACETAMINOPHEN 500 MG PO TABS
ORAL_TABLET | ORAL | Status: AC
  Filled 2022-11-15: qty 2

## 2022-11-15 MED ORDER — MIDAZOLAM HCL 2 MG/2ML IJ SOLN
1.0000 mg | Freq: Once | INTRAMUSCULAR | Status: AC
  Administered 2022-11-15: 1 mg via INTRAVENOUS

## 2022-11-15 MED ORDER — CEFAZOLIN SODIUM-DEXTROSE 2-4 GM/100ML-% IV SOLN
2.0000 g | INTRAVENOUS | Status: AC
  Administered 2022-11-15: 2 g via INTRAVENOUS

## 2022-11-15 MED ORDER — PHENYLEPHRINE 80 MCG/ML (10ML) SYRINGE FOR IV PUSH (FOR BLOOD PRESSURE SUPPORT)
PREFILLED_SYRINGE | INTRAVENOUS | Status: AC
  Filled 2022-11-15: qty 10

## 2022-11-15 MED ORDER — OXYCODONE HCL 5 MG/5ML PO SOLN: 5.0000 mg | Freq: Once | ORAL | Status: DC | PRN

## 2022-11-15 MED ORDER — SUGAMMADEX SODIUM 200 MG/2ML IV SOLN
INTRAVENOUS | Status: DC | PRN
  Administered 2022-11-15: 250 mg via INTRAVENOUS

## 2022-11-15 MED ORDER — CEFAZOLIN SODIUM-DEXTROSE 2-4 GM/100ML-% IV SOLN
INTRAVENOUS | Status: AC
  Filled 2022-11-15: qty 100

## 2022-11-15 MED ORDER — FENTANYL CITRATE (PF) 100 MCG/2ML IJ SOLN
INTRAMUSCULAR | Status: DC | PRN
  Administered 2022-11-15: 100 ug via INTRAVENOUS

## 2022-11-15 MED ORDER — PHENYLEPHRINE HCL-NACL 20-0.9 MG/250ML-% IV SOLN
INTRAVENOUS | Status: DC | PRN
  Administered 2022-11-15: 25 ug/min via INTRAVENOUS

## 2022-11-15 SURGICAL SUPPLY — 65 items
AID PSTN UNV HD RSTRNT DISP (MISCELLANEOUS) ×1
ANCH SUT 2.9 KNTLS QUATTRO (Anchor) ×1 IMPLANT
ANCHOR QUATTRO LINK KNTLS 2.9 (Anchor) IMPLANT
ANCHOR SUT 1.8 FIBERTAK SB KL (Anchor) IMPLANT
APL PRP STRL LF DISP 70% ISPRP (MISCELLANEOUS) ×2
BLADE EXCALIBUR 4.0X13 (MISCELLANEOUS) ×1 IMPLANT
BURR OVAL 8 FLU 4.0X13 (MISCELLANEOUS) IMPLANT
CANNULA 5.75X71 LONG (CANNULA) IMPLANT
CANNULA 7X7 TWIST-IN (CANNULA) IMPLANT
CANNULA PASSPORT 5 (CANNULA) IMPLANT
CANNULA PASSPORT BUTTON 10-40 (CANNULA) IMPLANT
CANNULA TWIST IN 8.25X7CM (CANNULA) IMPLANT
CHLORAPREP W/TINT 26 (MISCELLANEOUS) ×2 IMPLANT
COOLER ICEMAN CLASSIC (MISCELLANEOUS) ×1 IMPLANT
DRAPE IMP U-DRAPE 54X76 (DRAPES) ×1 IMPLANT
DRAPE INCISE IOBAN 66X45 STRL (DRAPES) ×1 IMPLANT
DRAPE SHOULDER BEACH CHAIR (DRAPES) ×1 IMPLANT
DRAPE U-SHAPE 47X51 STRL (DRAPES) ×2 IMPLANT
DW OUTFLOW CASSETTE/TUBE SET (MISCELLANEOUS) ×1 IMPLANT
GAUZE PAD ABD 8X10 STRL (GAUZE/BANDAGES/DRESSINGS) ×1 IMPLANT
GAUZE SPONGE 4X4 12PLY STRL (GAUZE/BANDAGES/DRESSINGS) ×1 IMPLANT
GAUZE XEROFORM 1X8 LF (GAUZE/BANDAGES/DRESSINGS) ×1 IMPLANT
GLOVE BIO SURGEON STRL SZ 6 (GLOVE) ×2 IMPLANT
GLOVE BIO SURGEON STRL SZ7.5 (GLOVE) ×1 IMPLANT
GLOVE BIOGEL PI IND STRL 6.5 (GLOVE) ×1 IMPLANT
GLOVE BIOGEL PI IND STRL 8 (GLOVE) ×1 IMPLANT
GOWN STRL REUS W/ TWL LRG LVL3 (GOWN DISPOSABLE) ×2 IMPLANT
GOWN STRL REUS W/ TWL XL LVL3 (GOWN DISPOSABLE) ×1 IMPLANT
GOWN STRL REUS W/TWL LRG LVL3 (GOWN DISPOSABLE) ×2
GOWN STRL REUS W/TWL XL LVL3 (GOWN DISPOSABLE) ×2 IMPLANT
KIT STABILIZATION SHOULDER (MISCELLANEOUS) ×1 IMPLANT
KIT STR SPEAR 1.8 FBRTK DISP (KITS) IMPLANT
LASSO 90 CVE QUICKPAS (DISPOSABLE) IMPLANT
LASSO CRESCENT QUICKPASS (SUTURE) IMPLANT
MANIFOLD NEPTUNE II (INSTRUMENTS) ×1 IMPLANT
NDL HD SCORPION MEGA LOADER (NEEDLE) IMPLANT
NDL SAFETY ECLIP 18X1.5 (MISCELLANEOUS) ×1 IMPLANT
NDL SUT PASSER RTC (NEEDLE) IMPLANT
NEEDLE SUT PASSER RTC (NEEDLE) ×1 IMPLANT
PACK ARTHROSCOPY DSU (CUSTOM PROCEDURE TRAY) ×1 IMPLANT
PACK BASIN DAY SURGERY FS (CUSTOM PROCEDURE TRAY) ×1 IMPLANT
PAD COLD SHLDR WRAP-ON (PAD) ×1 IMPLANT
PORT APPOLLO RF 90DEGREE MULTI (SURGICAL WAND) ×1 IMPLANT
RESTRAINT HEAD UNIVERSAL NS (MISCELLANEOUS) ×1 IMPLANT
SHEET MEDIUM DRAPE 40X70 STRL (DRAPES) IMPLANT
SLEEVE SCD COMPRESS KNEE MED (STOCKING) ×1 IMPLANT
SUT BROADBAND MINI LOOP BLACK (SUTURE) ×1
SUT BROADBAND MINI LOOP BLUE (SUTURE) ×1
SUT ETHILON 3 0 PS 1 (SUTURE) ×1 IMPLANT
SUT FIBERWIRE #2 38 T-5 BLUE (SUTURE)
SUT PDS AB 1 CT  36 (SUTURE)
SUT PDS AB 1 CT 36 (SUTURE) IMPLANT
SUT TIGER TAPE 7 IN WHITE (SUTURE) IMPLANT
SUTURE BROADBAND MINI LP BLACK (SUTURE) IMPLANT
SUTURE BROADBAND MINI LP BLUE (SUTURE) IMPLANT
SUTURE FIBERWR #2 38 T-5 BLUE (SUTURE) IMPLANT
SUTURE TAPE 1.3 40 TPR END (SUTURE) IMPLANT
SUTURE TAPE TIGERLINK 1.3MM BL (SUTURE) IMPLANT
SUTURETAPE 1.3 40 TPR END (SUTURE)
SUTURETAPE TIGERLINK 1.3MM BL (SUTURE)
SYR 5ML LL (SYRINGE) ×1 IMPLANT
TAPE FIBER 2MM 7IN #2 BLUE (SUTURE) IMPLANT
TOWEL GREEN STERILE FF (TOWEL DISPOSABLE) ×2 IMPLANT
TUBE CONNECTING 20X1/4 (TUBING) ×1 IMPLANT
TUBING ARTHROSCOPY IRRIG 16FT (MISCELLANEOUS) ×1 IMPLANT

## 2022-11-15 NOTE — Anesthesia Postprocedure Evaluation (Signed)
Anesthesia Post Note  Patient: Tracey Saunders  Procedure(s) Performed: RIGHT SHOULDER ARTHROSCOPY WITH BICEPS TENODESIS (Right: Shoulder)     Patient location during evaluation: PACU Anesthesia Type: General Level of consciousness: awake and alert Pain management: pain level controlled Vital Signs Assessment: post-procedure vital signs reviewed and stable Respiratory status: spontaneous breathing, nonlabored ventilation and respiratory function stable Cardiovascular status: stable and blood pressure returned to baseline Anesthetic complications: no   No notable events documented.  Last Vitals:  Vitals:   11/15/22 1130 11/15/22 1140  BP: (!) 152/91 (!) 152/83  Pulse:  78  Resp:  16  Temp:  36.9 C  SpO2: 95% 95%    Last Pain:  Vitals:   11/15/22 1140  TempSrc:   PainSc: 0-No pain                 Beryle Lathe

## 2022-11-15 NOTE — Brief Op Note (Signed)
   Brief Op Note  Date of Surgery: 11/15/2022  Preoperative Diagnosis: RIGHT SHOULDER ROTATOR CUFF TEAR  Postoperative Diagnosis: same  Procedure: Procedure(s): RIGHT SHOULDER ARTHROSCOPY WITH BICEPS TENODESIS  Implants: Implant Name Type Inv. Item Serial No. Manufacturer Lot No. LRB No. Used Action  ANCHOR QUATTRO LINK KNTLS 2.9 - [id number] Anchor ANCHOR Luisa Hart KNTLS 2.9  ZIMMER RECON(ORTH,TRAU,BIO,SG) [id number] Right 1 Implanted    Surgeons: Surgeon(s): Huel Cote, MD  Anesthesia: General    Estimated Blood Loss: See anesthesia record  Complications: None  Condition to PACU: Stable  Benancio Deeds, MD 11/15/2022 10:46 AM

## 2022-11-15 NOTE — Anesthesia Procedure Notes (Signed)
Anesthesia Regional Block: Interscalene brachial plexus block   Pre-Anesthetic Checklist: , timeout performed,  Correct Patient, Correct Site, Correct Laterality,  Correct Procedure, Correct Position, site marked,  Risks and benefits discussed,  Surgical consent,  Pre-op evaluation,  At surgeon's request and post-op pain management  Laterality: Right  Prep: chloraprep       Needles:  Injection technique: Single-shot  Needle Type: Echogenic Needle     Needle Length: 5cm  Needle Gauge: 21     Additional Needles:   Narrative:  Start time: 11/15/2022 8:23 AM End time: 11/15/2022 8:26 AM Injection made incrementally with aspirations every 5 mL.  Performed by: Personally  Anesthesiologist: Beryle Lathe, MD  Additional Notes: No pain on injection. No increased resistance to injection. Injection made in 5cc increments. Good needle visualization. Patient tolerated the procedure well.

## 2022-11-15 NOTE — Discharge Instructions (Addendum)
Discharge Instructions    Attending Surgeon: Huel Cote, MD Office Phone Number: [phone]   Diagnosis and Procedures:    Surgeries Performed: Right shoulder biceps tenodesis, subacromial debridement  Discharge Plan:    Diet: Resume usual diet. Begin with light or bland foods.  Drink plenty of fluids.  Activity:  Keep sling and dressing in place until your follow up visit in Physical Therapy You are advised to go home directly from the hospital or surgical center. Restrict your activities.  GENERAL INSTRUCTIONS: 1.  Keep your surgical site elevated above your heart for at least 5-7 days or longer to prevent swelling. This will improve your comfort and your overall recovery following surgery.     2. Please call Dr. Serena Croissant office at [phone] with questions Monday-Friday during business hours. If no one answers, please leave a message and someone should get back to the patient within 24 hours. For emergencies please call 911 or proceed to the emergency room.   3. Patient to notify surgical team if experiences any of the following: Bowel/Bladder dysfunction, uncontrolled pain, nerve/muscle weakness, incision with increased drainage or redness, nausea/vomiting and Fever greater than 101.0 F.  Be alert for signs of infection including redness, streaking, odor, fever or chills. Be alert for excessive pain or bleeding and notify your surgeon immediately.  WOUND INSTRUCTIONS:   Leave your dressing/cast/splint in place until your post operative visit.  Keep it clean and dry.  Always keep the incision clean and dry until the staples/sutures are removed. If there is no drainage from the incision you should keep it open to air. If there is drainage from the incision you must keep it covered at all times until the drainage stops  Do not soak in a bath tub, hot tub, pool, lake or other body of water until 21 days after your surgery and your incision is completely dry and  healed.  If you have removable sutures (or staples) they must be removed 10-14 days (unless otherwise instructed) from the day of your surgery.     1)  Elevate the extremity as much as possible.  2)  Keep the dressing clean and dry.  3)  Please call us if the dressing becomes wet or dirty.  4)  If you are experiencing worsening pain or worsening swelling, please call.     MEDICATIONS: Resume all previous home medications at the previous prescribed dose and frequency unless otherwise noted Start taking the  pain medications on an as-needed basis as prescribed  Please taper down pain medication over the next week following surgery.  Ideally you should not require a refill of any narcotic pain medication.  Take pain medication with food to minimize nausea. In addition to the prescribed pain medication, you may take over-the-counter pain relievers such as Tylenol.  Do NOT take additional tylenol if your pain medication already has tylenol in it.  Aspirin 325mg  daily for four weeks.      FOLLOWUP INSTRUCTIONS: 1. Follow up at the Physical Therapy Clinic 3-4 days following surgery. This appointment should be scheduled unless other arrangements have been made.The Physical Therapy scheduling number is [phone] if an appointment has not already been arranged.  2. Contact Dr. Serena Croissant office during office hours at [phone] or the practice after hours line at [phone] for non-emergencies. For medical emergencies call 911.   Discharge Location: Home    Post Anesthesia Home Care Instructions  Activity: Get plenty of rest for the remainder of the  day. A responsible individual must stay with you for 24 hours following the procedure.  For the next 24 hours, DO NOT: -Drive a car -Advertising copywriter -Drink alcoholic beverages -Take any medication unless instructed by your physician -Make any legal decisions or sign important papers.  Meals: Start with liquid foods such as gelatin  or soup. Progress to regular foods as tolerated. Avoid greasy, spicy, heavy foods. If nausea and/or vomiting occur, drink only clear liquids until the nausea and/or vomiting subsides. Call your physician if vomiting continues.  Special Instructions/Symptoms: Your throat may feel dry or sore from the anesthesia or the breathing tube placed in your throat during surgery. If this causes discomfort, gargle with warm salt water. The discomfort should disappear within 24 hours.  If you had a scopolamine patch placed behind your ear for the management of post- operative nausea and/or vomiting:  1. The medication in the patch is effective for 72 hours, after which it should be removed.  Wrap patch in a tissue and discard in the trash. Wash hands thoroughly with soap and water. 2. You may remove the patch earlier than 72 hours if you experience unpleasant side effects which may include dry mouth, dizziness or visual disturbances. 3. Avoid touching the patch. Wash your hands with soap and water after contact with the patch.

## 2022-11-15 NOTE — Anesthesia Procedure Notes (Signed)
Procedure Name: Intubation Date/Time: 11/15/2022 9:37 AM  Performed by: Lauralyn Primes, CRNAPre-anesthesia Checklist: Patient identified, Emergency Drugs available, Suction available and Patient being monitored Patient Re-evaluated:Patient Re-evaluated prior to induction Oxygen Delivery Method: Circle system utilized Preoxygenation: Pre-oxygenation with 100% oxygen Induction Type: IV induction Ventilation: Mask ventilation without difficulty Laryngoscope Size: Mac and 3 Grade View: Grade I Tube type: Oral Tube size: 7.0 mm Number of attempts: 1 Airway Equipment and Method: Stylet Placement Confirmation: ETT inserted through vocal cords under direct vision, positive ETCO2 and breath sounds checked- equal and bilateral Secured at: 23 cm Tube secured with: Tape Dental Injury: Teeth and Oropharynx as per pre-operative assessment

## 2022-11-15 NOTE — H&P (Signed)
Chief Complaint: Right shoulder pain        History of Present Illness:      Tracey Saunders is a 67 y.o. female presents today with right shoulder pain which has been ongoing now for several months.  She is having a very difficult time with any type of overhead activity.  She is here today as referral from Dr. Denyse Amass.  She did have a previous injection done in January of this year which gave her no significant prolonged relief.  She has been attempting to participate in physical therapy which has been quite limited as result of her shoulder pain.  She is right-hand dominant.  She enjoys painting and working around her house which she is unable to do as result of her persistent shoulder pain.  This time she has an extremely difficult time reaching for objects and is not able to lay directly on the side.  She is currently not working.       Surgical History:   None   PMH/PSH/Family History/Social History/Meds/Allergies:         Past Medical History:  Diagnosis Date   Anxiety disorder 09/09/2019   B12 deficiency     Cancer      cervical cancer   Depression     Diverticulitis     Fatty liver     GERD (gastroesophageal reflux disease)     Hyperlipidemia     Hypertension     Insomnia 09/09/2019   Liver cyst     Vitamin D deficiency           Past Surgical History:  Procedure Laterality Date   ABDOMINAL HYSTERECTOMY       COLON RESECTION        due to diverticulitis    Social History         Socioeconomic History   Marital status: Single      Spouse name: Not on file   Number of children: 1   Years of education: Not on file   Highest education level: Not on file  Occupational History   Occupation: retired   Occupation: retired  Tobacco Use   Smoking status: Light Smoker      Packs/day: .5      Types: Cigarettes   Smokeless tobacco: Never  Vaping Use   Vaping Use: Never used  Substance and Sexual Activity   Alcohol use: Not Currently      Comment:  occasional beer or bloody mary once a week   Drug use: Never   Sexual activity: Not on file  Other Topics Concern   Not on file  Social History Narrative   Not on file    Social Determinants of Health        Financial Resource Strain: Low Risk  (03/24/2022)    Overall Financial Resource Strain (CARDIA)     Difficulty of Paying Living Expenses: Not very hard  Recent Concern: Physicist, medical Strain - High Risk (02/16/2022)    Overall Financial Resource Strain (CARDIA)     Difficulty of Paying Living Expenses: Hard  Food Insecurity: No Food Insecurity (03/24/2022)    Hunger Vital Sign     Worried About Running Out of Food in the Last Year: Never true     Ran Out of Food in the Last Year: Never true  Recent Concern: Food Insecurity - Food Insecurity Present (02/16/2022)    Hunger Vital Sign     Worried About Programme researcher, broadcasting/film/video in  the Last Year: Sometimes true     Ran Out of Food in the Last Year: Sometimes true  Transportation Needs: No Transportation Needs (03/24/2022)    PRAPARE - Therapist, art (Medical): No     Lack of Transportation (Non-Medical): No  Physical Activity: Sufficiently Active (03/24/2022)    Exercise Vital Sign     Days of Exercise per Week: 5 days     Minutes of Exercise per Session: 30 min  Stress: Stress Concern Present (03/24/2022)    [employer] of Occupational Health - Occupational Stress Questionnaire     Feeling of Stress : To some extent  Social Connections: Socially Isolated (03/24/2022)    Social Connection and Isolation Panel [NHANES]     Frequency of Communication with Friends and Family: More than three times a week     Frequency of Social Gatherings with Friends and Family: Three times a week     Attends Religious Services: Never     Active Member of Clubs or Organizations: No     Attends Banker Meetings: Never     Marital Status: Never married         Family History  Problem Relation Age of Onset    Colon cancer Neg Hx     Esophageal cancer Neg Hx     Stomach cancer Neg Hx     Rectal cancer Neg Hx           Allergies  Allergen Reactions   Lisinopril Swelling   Losartan Swelling   Pseudoephedrine-Naproxen Na Er        Other Reaction(s): Other (See Comments)          Current Outpatient Medications  Medication Sig Dispense Refill   aspirin EC 325 MG tablet Take 1 tablet (325 mg total) by mouth daily. 14 tablet 0   oxyCODONE (OXY IR/ROXICODONE) 5 MG immediate release tablet Take 1 tablet (5 mg total) by mouth every 4 (four) hours as needed (severe pain). 20 tablet 0   amLODipine (NORVASC) 5 MG tablet Take 1 tablet (5 mg total) by mouth daily. 90 tablet 1   Artificial Saliva (BIOTENE DRY MOUTH MOISTURIZING) SOLN USe daily as needed for dry mouth 443 mL 0   atorvastatin (LIPITOR) 20 MG tablet TAKE 1 TABLET DAILY 90 tablet 1   azelastine (ASTELIN) 0.1 % nasal spray Place 2 sprays into both nostrils 2 (two) times daily. Use in each nostril as directed 30 mL 5   buPROPion (WELLBUTRIN XL) 150 MG 24 hr tablet Take 1 tablet (150 mg total) by mouth daily. 90 tablet 1   diclofenac Sodium (VOLTAREN) 1 % GEL Apply 4 g topically 4 (four) times daily as needed. 100 g 3   esomeprazole (NEXIUM) 40 MG capsule Take 1 capsule (40 mg total) by mouth daily. 30 capsule 2   fluticasone (FLONASE) 50 MCG/ACT nasal spray Place into both nostrils.       furosemide (LASIX) 20 MG tablet Take 1 tablet (20 mg total) by mouth 2 (two) times daily. 180 tablet 3   gabapentin (NEURONTIN) 300 MG capsule TAKE 2 CAPSULES(600MG       TOTAL) 3 TIMES A DAY 540 capsule 1   Hot/Cold Therapy Aids (HOT/COLD PACK) PADS Use as directed 3 each 0   Humidifiers MISC Use as needed to prevent dry mouth 1 each 0   levocetirizine (XYZAL) 5 MG tablet Take 5 mg by mouth 2 (two) times daily as needed.  pilocarpine (SALAGEN) 5 MG tablet Take 1 tablet (5 mg total) by mouth 2 (two) times daily. 60 tablet 1   potassium chloride SA (KLOR-CON  M) 20 MEQ tablet Take 1 tablet (20 mEq total) by mouth 2 (two) times daily. 180 tablet 1   sertraline (ZOLOFT) 50 MG tablet Take 1.5 tablets (75 mg total) by mouth at bedtime. 135 tablet 3   tizanidine (ZANAFLEX) 2 MG capsule Take 1 capsule (2 mg total) by mouth 3 (three) times daily. 30 capsule 1   traZODone (DESYREL) 100 MG tablet Take 1 tablet (100 mg total) by mouth at bedtime as needed for sleep. Please note, did larger dose so only 1 tab 90 tablet 3    No current facility-administered medications for this visit.    Imaging Results (Last 48 hours)  No results found.     Review of Systems:   A ROS was performed including pertinent positives and negatives as documented in the HPI.   Physical Exam :   Constitutional: NAD and appears stated age Neurological: Alert and oriented Psych: Appropriate affect and cooperative There were no vitals taken for this visit.    Comprehensive Musculoskeletal Exam:     Musculoskeletal Exam      Inspection Right Left  Skin No atrophy or winging No atrophy or winging  Palpation      Tenderness Glenohumeral, biceps None  Range of Motion      Flexion (passive) 150 170  Flexion (active) 90 170  Abduction 80 170  ER at the side 55 70  Can reach behind back to Back pocket T12  Strength        Forward elevation with pain none  Special Tests      Pseudoparalytic No No  Neurologic      Fires PIN, radial, median, ulnar, musculocutaneous, axillary, suprascapular, long thoracic, and spinal accessory innervated muscles. No abnormal sensibility  Vascular/Lymphatic      Radial Pulse 2+ 2+  Cervical Exam      Patient has symmetric cervical range of motion with negative Spurling's test.  Special Test: Positive Neer impingement, speeds test is positive        Imaging:   Xray (3 view right shoulder): Normal, mild AC degeneration   MRI (right shoulder): There is a large cyst around the biceps tendon consistent with biceps tendon irritation as well as  degenerative appearing findings of the rotator cuff without any specific insertional rotator cuff tear   I personally reviewed and interpreted the radiographs.     Assessment:   67 y.o. 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  At this time she has failed an injection as well as physical therapy.  She is hoping to get some type of definitive relief of the shoulder.  I did discuss that we could perform arthroscopic decompression with biceps tenodesis to address her biceps symptoms.  I do believe this would give her some significant relief.  I being said I do also believe that her rotator cuff inflammation is a pain generator.  I do not believe that she has a specific rotator cuff injury although we did discuss that if there is evidence of tendon that we would need to repair this and follow the rotator cuff repair protocol.  Restrictions limitations as well as recovery time associated with this were discussed.  She would like to pursue a more definitive approach with right shoulder arthroscopy   Plan :     -  Plan for right shoulder arthroscopy with biceps tenodesis, possible rotator cuff repair versus collagen patch     After a lengthy discussion of treatment options, including risks, benefits, alternatives, complications of surgical and nonsurgical conservative options, the patient elected surgical repair.    The patient  is aware of the material risks  and complications including, but not limited to injury to adjacent structures, neurovascular injury, infection, numbness, bleeding, implant failure, thermal burns, stiffness, persistent pain, failure to heal, disease transmission from allograft, need for further surgery, dislocation, anesthetic risks, blood clots, risks of death,and others. The probabilities of surgical success and failure discussed with patient given their particular co-morbidities.The time and nature of expected  rehabilitation and recovery was discussed.The patient's questions were all answered preoperatively.  No barriers to understanding were noted. I explained the natural history of the disease process and Rx rationale.  I explained to the patient what I considered to be reasonable expectations given their personal situation.  The final treatment plan was arrived at through a shared patient decision making process model.           I personally saw and evaluated the patient, and participated in the management and treatment plan.   Huel Cote, MD Attending Physician, Orthopedic Surgery

## 2022-11-15 NOTE — Progress Notes (Signed)
Assisted Dr. Brock with right, interscalene , ultrasound guided block. Side rails up, monitors on throughout procedure. See vital signs in flow sheet. Tolerated Procedure well. 

## 2022-11-15 NOTE — Anesthesia Preprocedure Evaluation (Addendum)
Anesthesia Evaluation  Patient identified by MRN, date of birth, ID band Patient awake    Reviewed: Allergy & Precautions, NPO status , Patient's Chart, lab work & pertinent test results  History of Anesthesia Complications Negative for: history of anesthetic complications  Airway Mallampati: III  TM Distance: >3 FB Neck ROM: Full    Dental  (+) Dental Advisory Given, Edentulous Upper, Edentulous Lower   Pulmonary asthma , Current SmokerPatient did not abstain from smoking.   Pulmonary exam normal        Cardiovascular hypertension, Pt. on medications Normal cardiovascular exam   '23 TTE - EF 60 to 65%. Grade I diastolic dysfunction (impaired relaxation). Mild MR.    Neuro/Psych  Headaches PSYCHIATRIC DISORDERS Anxiety Depression       GI/Hepatic Neg liver ROS,GERD  Medicated and Poorly Controlled,,  Endo/Other  negative endocrine ROS    Renal/GU negative Renal ROS     Musculoskeletal negative musculoskeletal ROS (+)    Abdominal   Peds  Hematology negative hematology ROS (+)   Anesthesia Other Findings   Reproductive/Obstetrics  Cervical cancer s/p hysterectomy                               Anesthesia Physical Anesthesia Plan  ASA: 3  Anesthesia Plan: General   Post-op Pain Management: Tylenol PO (pre-op)* and Regional block*   Induction: Intravenous  PONV Risk Score and Plan: 2 and Treatment may vary due to age or medical condition, Ondansetron and Dexamethasone  Airway Management Planned: Oral ETT  Additional Equipment: None  Intra-op Plan:   Post-operative Plan: Extubation in OR  Informed Consent: I have reviewed the patients History and Physical, chart, labs and discussed the procedure including the risks, benefits and alternatives for the proposed anesthesia with the patient or authorized representative who has indicated his/her understanding and acceptance.      Dental advisory given  Plan Discussed with: CRNA and Anesthesiologist  Anesthesia Plan Comments:          Anesthesia Quick Evaluation

## 2022-11-15 NOTE — Op Note (Signed)
Date of Surgery: 11/15/2022  INDICATIONS: Ms. Tracey Saunders is a 67 y.o.-year-old female with right shoulder subacromial impingement which is failed conservative management.  The risk and benefits of the procedure were discussed in detail and documented in the pre-operative evaluation.   PREOPERATIVE DIAGNOSES: Right shoulder, biceps tendinitis and subacromial impingement.  POSTOPERATIVE DIAGNOSIS: Same.  PROCEDURE: Arthroscopic extensive debridement - 29823 Subdeltoid Bursa, Supraspinatus Tendon, Anterior Labrum, Superior Labrum, and Posterior Labrum Arthroscopic subacromial decompression - 16109 Arthroscopic biceps tenodesis - 60454  SURGEON: Benancio Deeds MD  ASSISTANT: Kerby Less, ATC  ANESTHESIA:  general plus interscalene nerve block  IV FLUIDS AND URINE: See anesthesia record.  ANTIBIOTICS: Ancef  ESTIMATED BLOOD LOSS: 10 mL.  IMPLANTS:  Implant Name Type Inv. Item Serial No. Manufacturer Lot No. LRB No. Used Action  ANCHOR QUATTRO LINK KNTLS 2.9 - [id number] Anchor ANCHOR QUATTRO LINK KNTLS 2.9  [employer](ORTH,TRAU,BIO,SG) [id number] Right 1 Implanted    DRAINS: None  CULTURES: None  COMPLICATIONS: none  PROCEDURE:    OPERATIVE FINDING: Exam under anesthesia:   Examination under anesthesia revealed forward elevation of 150 degrees.  With the arm at the side, there was 65 degrees of external rotation.  There is a 1+ anterior load shift and a 1+ posterior load shift.    Arthroscopic findings demonstrated: Articular space: Significant fraying of the anterior superior posterior labrum Chondral surfaces: Normal Biceps: Redness and injection consistent with tearing Subscapularis: Intact Supraspinatus: Small undersurface fraying at the insertional footprint otherwise intact Infraspinatus: Intact    I identified the patient in the pre-operative holding area.  I marked the operative right shoulder with my initials. I reviewed the risks and benefits of the proposed  surgical intervention and the patient wished to proceed.  Anesthesia was then performed with regional block.  The patient was transferred to the operative suite and placed in the beach chair position with all bony prominences padded.     SCDs were placed on bilateral lower extremity. Appropriate antibiotics was administered within 1 hour before incision.  Anesthesia was induced.  The operative extremity was then prepped and draped in standard fashion. A time out was performed confirming the correct extremity, correct patient and correct procedure.   The arthroscope was introduced in the glenohumeral joint from a posterior portal.  An anterior portal was created.  The shoulder was examined and the above findings were noted.     With an arthroscopic shaver and a wand ablator, synovitis throughout the  shoulder was resected.  The arthroscopic shaver was used to excise torn portions of the labrum back to a stable margin. Specifically this was done for the anterior superior and posterior labrum.  At this time the biceps tendon was captured using a mini broadband loop #2 a total of 2 times.  An awl was used to create a space in the humeral head at the cephalad border of the subscapularis and the biceps was then anchored into the native origin.  The remaining biceps stump was then shrunk down with electrocautery.    The rotator cuff was then approached through the subacromial space. Anterior, lateral, and posterior portals were used.  Bursectomy was performed with an arthroscopic shaver and [employer] wand.  The soft tissues on the undersurface of the acromion and clavicle were resected with the arthroscopic shaver and wand. There was good excursion that was noted of the tendon back to its footprint which would be amendable to an repair.    The shoulder was irrigated.  The arthroscopic  instruments were removed.  Wounds were closed with 3-0 nylon sutures.  A sterile dressing was applied with xeroform, 4x8s,  abdominal pad, and tape. An Flonnie Hailstone was placed and the upper extremity was placed in a shoulder immobilizer.  The patient tolerated the procedure well and was taken to the recovery room in stable condition.  All counts were correct in the case. The patient tolerated the procedure well and was taken to the recovery room in stable condition.    POSTOPERATIVE PLAN: She will follow the biceps tenodesis repair protocol.  She will begin physical therapy immediately.  She will place on aspirin for blood clot prevention.  I will see her back in 2 weeks for suture removal.  Benancio Deeds, MD 10:47 AM

## 2022-11-15 NOTE — Transfer of Care (Signed)
Immediate Anesthesia Transfer of Care Note  Patient: Tracey Saunders  Procedure(s) Performed: RIGHT SHOULDER ARTHROSCOPY WITH BICEPS TENODESIS (Right: Shoulder)  Patient Location: PACU  Anesthesia Type:GA combined with regional for post-op pain  Level of Consciousness: drowsy  Airway & Oxygen Therapy: Patient Spontanous Breathing and Patient connected to face mask oxygen  Post-op Assessment: Report given to RN and Post -op Vital signs reviewed and stable  Post vital signs: Reviewed and stable  Last Vitals:  Vitals Value Taken Time  BP 162/79 (100)   Temp 36.2 C 11/15/22 1100  Pulse 87 11/15/22 1102  Resp 15 11/15/22 1102  SpO2 99 % 11/15/22 1102  Vitals shown include unvalidated device data.  Last Pain:  Vitals:   11/15/22 0746  TempSrc: Oral  PainSc: 10-Worst pain ever      Patients Stated Pain Goal: 3 (11/15/22 0746)  Complications: No notable events documented.

## 2022-11-15 NOTE — Interval H&P Note (Signed)
History and Physical Interval Note:  11/15/2022 7:18 AM  Tracey Saunders  has presented today for surgery, with the diagnosis of RIGHT SHOULDER ROTATOR CUFF TEAR.  The various methods of treatment have been discussed with the patient and family. After consideration of risks, benefits and other options for treatment, the patient has consented to  Procedure(s): RIGHT SHOULDER ARTHROSCOPY WITH BICEPS TENODESIS / POSSIBLE ROTATOR CUFF REPAIR VS. COLLAGEN PATCH AUGMENTATION (Right) as a surgical intervention.  The patient's history has been reviewed, patient examined, no change in status, stable for surgery.  I have reviewed the patient's chart and labs.  Questions were answered to the patient's satisfaction.     Huel Cote

## 2022-11-16 ENCOUNTER — Encounter (HOSPITAL_BASED_OUTPATIENT_CLINIC_OR_DEPARTMENT_OTHER): Payer: Self-pay | Admitting: Orthopaedic Surgery

## 2022-11-17 NOTE — Therapy (Signed)
OUTPATIENT PHYSICAL THERAPY EVALUATION   Patient Name: Tracey Saunders MRN: [id number] DOB:10-19-1955, 67 y.o., female Today's Date: 11/18/2022  END OF SESSION:  PT End of Session - 11/18/22 0758     Visit Number 1    Number of Visits 20    Date for PT Re-Evaluation 01/27/23    Authorization Type [employer] Medicare $20 copay    Progress Note Due on Visit 10    PT Start Time 0800    PT Stop Time 0830    PT Time Calculation (min) 30 min    Activity Tolerance Patient limited by pain    Behavior During Therapy [facility] for tasks assessed/performed             Past Medical History:  Diagnosis Date   Anxiety disorder 09/09/2019   B12 deficiency    Cancer (HCC)    cervical cancer   Depression    Diverticulitis    Fatty liver    GERD (gastroesophageal reflux disease)    Hyperlipidemia    Hypertension    Insomnia 09/09/2019   Liver cyst    Vitamin D deficiency    Past Surgical History:  Procedure Laterality Date   ABDOMINAL HYSTERECTOMY     COLON RESECTION     due to diverticulitis   SHOULDER ARTHROSCOPY WITH ROTATOR CUFF REPAIR Right 11/15/2022   Procedure: RIGHT SHOULDER ARTHROSCOPY WITH BICEPS TENODESIS;  Surgeon: Huel Cote, MD;  Location: [facility];  Service: Orthopedics;  Laterality: Right;   Patient Active Problem List   Diagnosis Date Noted   Subacromial impingement of right shoulder 11/15/2022   Biceps tendinitis of right shoulder 11/15/2022   Asthmatic bronchitis , chronic 12/15/2021   Chronic right-sided low back pain with sciatica 06/22/2020   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   Headache, drug induced 03/12/2020   Hair thinning 11/12/2019   Recurrent major depressive disorder, in full remission (HCC) 11/04/2019   Gastroesophageal reflux disease 09/09/2019   Insomnia 09/09/2019   Anxiety disorder 09/09/2019   Essential hypertension 06/03/2019   Hyperlipidemia 06/03/2019   History of cancer 04/29/2019   Vitamin B12 deficiency 04/29/2019    Vitamin D deficiency 04/29/2019    PCP: Georgann Housekeeper PROVIDER: Huel Cote, MD  REFERRING DIAG: [phone] (ICD-10-CM) - Biceps tendinitis of right shoulder  THERAPY DIAG:  Chronic right shoulder pain  Muscle weakness (generalized)  Abnormal posture  Localized edema  Rationale for Evaluation and Treatment: Rehabilitation  ONSET DATE: Surgery 11/15/2022  SUBJECTIVE:  SUBJECTIVE STATEMENT: Rt shoulder arthroscopy with biceps tenodesis 11/15/2022.  Reported constant pain since surgery, wearing sling all the time.   Pt reported sleeping due to symptoms.  She indicated trying bed and chair to sleep.  Limited in movement due to sling post surgery.   PERTINENT HISTORY: History of cervical cancer, depression, hyperlipidemia, HTN.  History Right lateral ankle and right subacromial steroid injections.  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   History of chronic pain in back/leg.   PAIN:  NPRS scale: current 9/10, at worst 10/10 Pain location: Rt shoulder Pain description: constant Aggravating factors: movement, hitting arm.  Relieving factors: cold, heat pack  PRECAUTIONS: Shoulder  PROM only 4 weeks (until 12/13/2022)  WEIGHT BEARING RESTRICTIONS: Yes Rt UE  FALLS:  Has patient fallen in last 6 months? No  LIVING ENVIRONMENT: Lives with: lives alone Lives in: House/apartment Stairs:7 stairs   OCCUPATION: No specific work   PLOF: Independent  PATIENT GOALS:No pain, get back to activity  OBJECTIVE:   PATIENT SURVEYS:  11/18/2022 FOTO intake:  28   predicted:  63  COGNITION: 11/18/2022 Overall cognitive status: WFL     SENSATION: 11/18/2022 Not tested  POSTURE: 11/18/2022 Rounded shoulders, sling on Rt UE  UPPER EXTREMITY ROM:   ROM Right 11/18/2022  PROM in supine to tolerance  Left 11/18/2022  Shoulder flexion 50   Shoulder extension    Shoulder abduction 60   Shoulder adduction    Shoulder internal rotation To belly   Shoulder external rotation 5   Elbow flexion    Elbow extension    Wrist flexion    Wrist extension    Wrist ulnar deviation    Wrist radial deviation    Wrist pronation    Wrist supination    (Blank rows = not tested)  UPPER EXTREMITY MMT: 11/18/2022:  no testing Rt shoulder due to surgery protocol  MMT Right 11/18/2022 Left 11/18/2022  Shoulder flexion    Shoulder extension    Shoulder abduction    Shoulder adduction    Shoulder internal rotation    Shoulder external rotation    Middle trapezius    Lower trapezius    Elbow flexion    Elbow extension    Wrist flexion    Wrist extension    Wrist ulnar deviation    Wrist radial deviation    Wrist pronation    Wrist supination    Grip strength (lbs)    (Blank rows = not tested)  SHOULDER SPECIAL TESTS: 11/18/2022 None tested today  JOINT MOBILITY TESTING:  11/18/2022 No specific joint restriction noted in early range, guarding primary limit with pain.   PALPATION:  11/18/2022 General tenderness around Rt shoulder. Localized edema visually around Rt shoulder post surgery.   TODAY'S TREATMENT:                                                                           DATE:11/18/2022 Therex:    HEP instruction/performance c cues for techniques, handout provided.  Trial set performed of each for comprehension and symptom assessment.  See below for exercise list  Manual: PROM Rt shoulder to tolerance (severe limit due to pain/guarding)    PATIENT EDUCATION: 11/18/2022 Education details: HEP, POC Person  educated: Patient Education method: Explanation, Demonstration, Verbal cues, and Handouts Education comprehension: verbalized understanding, returned demonstration, and verbal cues required  HOME EXERCISE PROGRAM: Access Code: [email or web address] URL:  [email or web address] Date: 11/18/2022 Prepared by: Chyrel Masson  Exercises - Circular Shoulder Pendulum with Table Support  - 2-3 x daily - 7 x weekly - 1-2 sets - 10 reps - Standing Horizontal Shoulder Pendulum Supported with Arm Bent  - 2-3 x daily - 7 x weekly - 1-2 sets - 10 reps - Standing Flexion Extension Shoulder Pendulum Supported with Arm Bent  - 2-3 x daily - 7 x weekly - 1-2 sets - 10 reps - Standing Circular Shoulder Pendulum Supported with Arm Bent  - 2-3 x daily - 7 x weekly - 1-2 sets - 10 reps - Seated Gripping Towel (Mirrored)  - 2-3 x daily - 7 x weekly - 1 sets - 10 reps - 5 hold - Seated Scapular Retraction  - 3-5 x daily - 7 x weekly - 1 sets - 10 reps - 3-5 hold  ASSESSMENT:  CLINICAL IMPRESSION: Patient is a 67 y.o. who comes to clinic with complaints of Rt shoulder pain s/p recent arthroscopy with biceps tenodesis on 11/15/2022 with mobility, strength and movement coordination deficits that impair their ability to perform usual daily and recreational functional activities without increase difficulty/symptoms at this time.  Patient to benefit from skilled PT services to address impairments and limitations to improve to previous level of function without restriction secondary to condition.   OBJECTIVE IMPAIRMENTS: decreased activity tolerance, decreased coordination, decreased endurance, decreased mobility, decreased ROM, decreased strength, increased edema, increased fascial restrictions, impaired perceived functional ability, impaired flexibility, impaired UE functional use, improper body mechanics, and pain.   ACTIVITY LIMITATIONS: carrying, lifting, sleeping, bed mobility, bathing, toileting, dressing, self feeding, reach over head, and hygiene/grooming  PARTICIPATION LIMITATIONS: meal prep, cleaning, laundry, interpersonal relationship, driving, shopping, and community activity  PERSONAL FACTORS:  History of cervical cancer, depression,  hyperlipidemia, HTN, length of time since onset  are also affecting patient's functional outcome.   REHAB POTENTIAL: Good  CLINICAL DECISION MAKING: Stable/uncomplicated  EVALUATION COMPLEXITY: Low   GOALS: Goals reviewed with patient? Yes  SHORT TERM GOALS: (target date for Short term goals are 3 weeks 12/09/2022)  1.Patient will demonstrate independent use of home exercise program to maintain progress from in clinic treatments. Goal status: New  LONG TERM GOALS: (target dates for all long term goals are 10 weeks  01/27/2023 )   1. Patient will demonstrate/report pain at worst less than or equal to 2/10 to facilitate minimal limitation in daily activity secondary to pain symptoms. Goal status: New   2. Patient will demonstrate independent use of home exercise program to facilitate ability to maintain/progress functional gains from skilled physical therapy services. Goal status: New   3. Patient will demonstrate FOTO outcome > or = 63 % to indicate reduced disability due to condition. Goal status: New   4.  Patient will demonstrate Rt UE MMT 5/5 throughout to facilitate lifting, reaching, carrying at [facility] in daily activity.   Goal status: New   5.  Patient will demonstrate Rt GH joint AROM WFL s symptoms to facilitate usual overhead reaching, self care, dressing at PLOF.    Goal status: New   6.  Patient will demonstrate/report ability to sleep in bed s restriction due to symptoms.  Goal status: New    PLAN:  PT FREQUENCY: 2x/week  PT DURATION: 10 weeks  PLANNED INTERVENTIONS: Therapeutic exercises, Therapeutic activity,  Neuro Muscular re-education, Balance training, Gait training, Patient/Family education, Joint mobilization, Stair training, DME instructions, Dry Needling, Electrical stimulation, Traction, Cryotherapy, vasopneumatic device Moist heat, Taping, Ultrasound, Ionotophoresis 4mg /ml Dexamethasone, and aquatic therapy ,Manual therapy.  All included unless  contraindicated  PLAN FOR NEXT SESSION: Review HEP knowledge/results.   Manual and passive range.   PROM only 4 weeks (until 12/13/2022)   Chyrel Masson, PT, DPT, OCS, ATC 11/18/22  8:35 AM

## 2022-11-18 ENCOUNTER — Ambulatory Visit: Payer: Medicare HMO | Admitting: Rehabilitative and Restorative Service Providers"

## 2022-11-18 ENCOUNTER — Other Ambulatory Visit: Payer: Self-pay

## 2022-11-18 ENCOUNTER — Encounter: Payer: Self-pay | Admitting: Rehabilitative and Restorative Service Providers"

## 2022-11-18 ENCOUNTER — Ambulatory Visit (HOSPITAL_BASED_OUTPATIENT_CLINIC_OR_DEPARTMENT_OTHER): Payer: Medicare HMO | Admitting: Physical Therapy

## 2022-11-18 DIAGNOSIS — G8929 Other chronic pain: Secondary | ICD-10-CM

## 2022-11-18 DIAGNOSIS — M25511 Pain in right shoulder: Secondary | ICD-10-CM

## 2022-11-18 DIAGNOSIS — R293 Abnormal posture: Secondary | ICD-10-CM

## 2022-11-18 DIAGNOSIS — M6281 Muscle weakness (generalized): Secondary | ICD-10-CM

## 2022-11-18 DIAGNOSIS — R600 Localized edema: Secondary | ICD-10-CM

## 2022-11-23 ENCOUNTER — Encounter: Payer: Medicare HMO | Admitting: Rehabilitative and Restorative Service Providers"

## 2022-11-23 ENCOUNTER — Ambulatory Visit: Payer: Medicare HMO | Admitting: Physical Therapy

## 2022-11-23 ENCOUNTER — Encounter: Payer: Self-pay | Admitting: Physical Therapy

## 2022-11-23 DIAGNOSIS — G8929 Other chronic pain: Secondary | ICD-10-CM | POA: Diagnosis not present

## 2022-11-23 DIAGNOSIS — R600 Localized edema: Secondary | ICD-10-CM | POA: Diagnosis not present

## 2022-11-23 DIAGNOSIS — M25511 Pain in right shoulder: Secondary | ICD-10-CM | POA: Diagnosis not present

## 2022-11-23 DIAGNOSIS — R293 Abnormal posture: Secondary | ICD-10-CM

## 2022-11-23 DIAGNOSIS — M6281 Muscle weakness (generalized): Secondary | ICD-10-CM | POA: Diagnosis not present

## 2022-11-23 NOTE — Therapy (Addendum)
OUTPATIENT PHYSICAL THERAPY TREATMENT/DISCHARGE   Patient Name: Tracey Saunders MRN: [id number] DOB:Nov 29, 1955, 67 y.o., female Today's Date: 11/23/2022  END OF SESSION:  PT End of Session - 11/23/22 1509     Visit Number 2    Number of Visits 20    Date for PT Re-Evaluation 01/27/23    Authorization Type [employer] Medicare $20 copay    Progress Note Due on Visit 10    PT Start Time 1507    PT Stop Time 1530    PT Time Calculation (min) 23 min    Activity Tolerance Patient limited by pain    Behavior During Therapy [facility] for tasks assessed/performed              Past Medical History:  Diagnosis Date   Anxiety disorder 09/09/2019   B12 deficiency    Cancer (HCC)    cervical cancer   Depression    Diverticulitis    Fatty liver    GERD (gastroesophageal reflux disease)    Hyperlipidemia    Hypertension    Insomnia 09/09/2019   Liver cyst    Vitamin D deficiency    Past Surgical History:  Procedure Laterality Date   ABDOMINAL HYSTERECTOMY     COLON RESECTION     due to diverticulitis   SHOULDER ARTHROSCOPY WITH ROTATOR CUFF REPAIR Right 11/15/2022   Procedure: RIGHT SHOULDER ARTHROSCOPY WITH BICEPS TENODESIS;  Surgeon: Huel Cote, MD;  Location: [facility];  Service: Orthopedics;  Laterality: Right;   Patient Active Problem List   Diagnosis Date Noted   Subacromial impingement of right shoulder 11/15/2022   Biceps tendinitis of right shoulder 11/15/2022   Asthmatic bronchitis , chronic 12/15/2021   Chronic right-sided low back pain with sciatica 06/22/2020   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   Headache, drug induced 03/12/2020   Hair thinning 11/12/2019   Recurrent major depressive disorder, in full remission (HCC) 11/04/2019   Gastroesophageal reflux disease 09/09/2019   Insomnia 09/09/2019   Anxiety disorder 09/09/2019   Essential hypertension 06/03/2019   Hyperlipidemia 06/03/2019   History of cancer 04/29/2019   Vitamin B12 deficiency  04/29/2019   Vitamin D deficiency 04/29/2019    PCP: Georgann Housekeeper PROVIDER: Huel Cote, MD  REFERRING DIAG: [phone] (ICD-10-CM) - Biceps tendinitis of right shoulder  THERAPY DIAG:  Chronic right shoulder pain  Muscle weakness (generalized)  Abnormal posture  Localized edema  Rationale for Evaluation and Treatment: Rehabilitation  ONSET DATE: Surgery 11/15/2022  SUBJECTIVE:  SUBJECTIVE STATEMENT: Reports she is doing more than PROM.  "I'm going to clean my house."  PERTINENT HISTORY: History of cervical cancer, depression, hyperlipidemia, HTN.  History Right lateral ankle and right subacromial steroid injections.  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   History of chronic pain in back/leg.   PAIN:  NPRS scale: current 5/10, at worst 10/10 Pain location: Rt shoulder Pain description: constant Aggravating factors: movement, hitting arm.  Relieving factors: cold, heat pack  PRECAUTIONS: Shoulder  PROM only 4 weeks (until 12/13/2022)  WEIGHT BEARING RESTRICTIONS: Yes Rt UE  FALLS:  Has patient fallen in last 6 months? No  LIVING ENVIRONMENT: Lives with: lives alone Lives in: House/apartment Stairs:7 stairs   OCCUPATION: No specific work   PLOF: Independent  PATIENT GOALS:No pain, get back to activity  OBJECTIVE:   PATIENT SURVEYS:  11/18/2022 FOTO intake:  28   predicted:  63     SENSATION: 11/18/2022 Not tested  POSTURE: 11/18/2022 Rounded shoulders, sling on Rt UE  UPPER EXTREMITY ROM:   ROM Right 11/18/2022  PROM in supine to tolerance Right 11/23/22  PROM in supine  Shoulder flexion 50 160  Shoulder extension    Shoulder abduction 60 177  Shoulder adduction    Shoulder internal rotation To belly 90  Shoulder external rotation 5 90  (Blank rows = not  tested)  UPPER EXTREMITY MMT: 11/18/2022:  no testing Rt shoulder due to surgery protocol  MMT Right 11/18/2022 Left 11/18/2022  Shoulder flexion    Shoulder extension    Shoulder abduction    Shoulder adduction    Shoulder internal rotation    Shoulder external rotation    Middle trapezius    Lower trapezius    Elbow flexion    Elbow extension    Wrist flexion    Wrist extension    Wrist ulnar deviation    Wrist radial deviation    Wrist pronation    Wrist supination    Grip strength (lbs)    (Blank rows = not tested)  SHOULDER SPECIAL TESTS: 11/18/2022 None tested today  JOINT MOBILITY TESTING:  11/18/2022 No specific joint restriction noted in early range, guarding primary limit with pain.   PALPATION:  11/18/2022 General tenderness around Rt shoulder. Localized edema visually around Rt shoulder post surgery.   TODAY'S TREATMENT:                                                                           DATE:11/23/2022 Therex: Pendulums lateral; anterior/posterior, CW/CCW circles x 15 reps each direction  Manual PROM of Rt shoulder; PROM full all directions PROM measurements - see above   Manual: PROM Rt shoulder to tolerance (severe limit due to pain/guarding)   TODAY'S TREATMENT:                                                                           DATE:11/18/2022 Therex:    HEP instruction/performance c cues for techniques,  handout provided.  Trial set performed of each for comprehension and symptom assessment.  See below for exercise list  Manual: PROM Rt shoulder to tolerance (severe limit due to pain/guarding)    PATIENT EDUCATION: 11/18/2022 Education details: HEP, POC Person educated: Patient Education method: Programmer, multimedia, Demonstration, Verbal cues, and Handouts Education comprehension: verbalized understanding, returned demonstration, and verbal cues required  HOME EXERCISE PROGRAM: Access Code: [email or web address] URL:  [email or web address] Date: 11/18/2022 Prepared by: Chyrel Masson  Exercises - Circular Shoulder Pendulum with Table Support  - 2-3 x daily - 7 x weekly - 1-2 sets - 10 reps - Standing Horizontal Shoulder Pendulum Supported with Arm Bent  - 2-3 x daily - 7 x weekly - 1-2 sets - 10 reps - Standing Flexion Extension Shoulder Pendulum Supported with Arm Bent  - 2-3 x daily - 7 x weekly - 1-2 sets - 10 reps - Standing Circular Shoulder Pendulum Supported with Arm Bent  - 2-3 x daily - 7 x weekly - 1-2 sets - 10 reps - Seated Gripping Towel (Mirrored)  - 2-3 x daily - 7 x weekly - 1 sets - 10 reps - 5 hold - Seated Scapular Retraction  - 3-5 x daily - 7 x weekly - 1 sets - 10 reps - 3-5 hold  ASSESSMENT:  CLINICAL IMPRESSION: Pt demonstrating significant improvement in PROM today which is essentially full ROM.  Unable to progress much further within protocol limitations at this time.  Will hold PT until after next MD visit, and if able to progress to AA or active exercises will resume therapy.  If not will continue to hold until able to progress.  OBJECTIVE IMPAIRMENTS: decreased activity tolerance, decreased coordination, decreased endurance, decreased mobility, decreased ROM, decreased strength, increased edema, increased fascial restrictions, impaired perceived functional ability, impaired flexibility, impaired UE functional use, improper body mechanics, and pain.   ACTIVITY LIMITATIONS: carrying, lifting, sleeping, bed mobility, bathing, toileting, dressing, self feeding, reach over head, and hygiene/grooming  PARTICIPATION LIMITATIONS: meal prep, cleaning, laundry, interpersonal relationship, driving, shopping, and community activity  PERSONAL FACTORS:  History of cervical cancer, depression, hyperlipidemia, HTN, length of time since onset  are also affecting patient's functional outcome.   REHAB POTENTIAL: Good  CLINICAL DECISION MAKING: Stable/uncomplicated  EVALUATION  COMPLEXITY: Low   GOALS: Goals reviewed with patient? Yes  SHORT TERM GOALS: (target date for Short term goals are 3 weeks 12/09/2022)  1.Patient will demonstrate independent use of home exercise program to maintain progress from in clinic treatments. Goal status: New  LONG TERM GOALS: (target dates for all long term goals are 10 weeks  01/27/2023 )   1. Patient will demonstrate/report pain at worst less than or equal to 2/10 to facilitate minimal limitation in daily activity secondary to pain symptoms. Goal status: New   2. Patient will demonstrate independent use of home exercise program to facilitate ability to maintain/progress functional gains from skilled physical therapy services. Goal status: New   3. Patient will demonstrate FOTO outcome > or = 63 % to indicate reduced disability due to condition. Goal status: New   4.  Patient will demonstrate Rt UE MMT 5/5 throughout to facilitate lifting, reaching, carrying at [facility] in daily activity.   Goal status: New   5.  Patient will demonstrate Rt GH joint AROM WFL s symptoms to facilitate usual overhead reaching, self care, dressing at PLOF.    Goal status: New   6.  Patient will demonstrate/report ability to sleep in bed s restriction due to  symptoms.  Goal status: New    PLAN:  PT FREQUENCY: 2x/week  PT DURATION: 10 weeks  PLANNED INTERVENTIONS: Therapeutic exercises, Therapeutic activity, Neuro Muscular re-education, Balance training, Gait training, Patient/Family education, Joint mobilization, Stair training, DME instructions, Dry Needling, Electrical stimulation, Traction, Cryotherapy, vasopneumatic device Moist heat, Taping, Ultrasound, Ionotophoresis 4mg /ml Dexamethasone, and aquatic therapy ,Manual therapy.  All included unless contraindicated  PLAN FOR NEXT SESSION: hold until able to begin AA/AROM exercises Review HEP knowledge/results.     PROM only 4 weeks (until 12/13/2022)  NEXT MD VISIT:  11/30/22   Clarita Crane, PT, DPT 11/23/22 3:32 PM   PHYSICAL THERAPY DISCHARGE SUMMARY  Visits from Start of Care: 2  Current functional level related to goals / functional outcomes: See note   Remaining deficits: See note   Education / Equipment: HEP  Patient goals were partially met. Patient is being discharged due to not returning since the last visit.  Chyrel Masson, PT, DPT, OCS, ATC 01/06/23  7:56 AM

## 2022-11-24 ENCOUNTER — Other Ambulatory Visit: Payer: Self-pay | Admitting: *Deleted

## 2022-11-24 ENCOUNTER — Telehealth: Payer: Self-pay | Admitting: Family Medicine

## 2022-11-24 MED ORDER — ESOMEPRAZOLE MAGNESIUM 40 MG PO CPDR: 40.0000 mg | DELAYED_RELEASE_CAPSULE | Freq: Every day | ORAL | 2 refills | Status: DC

## 2022-11-24 NOTE — Telephone Encounter (Signed)
Rx sent to the pharmacy.

## 2022-11-24 NOTE — Telephone Encounter (Signed)
Prescription Request  11/24/2022  LOV: 10/12/2022  What is the name of the medication or equipment?  esomeprazole (NEXIUM) 40 MG capsule   Have you contacted your pharmacy to request a refill? No   Which pharmacy would you like this sent to? [id number] - [place] ([place]), [place] - [address] [place] ([place]) [place] [address] Phone: [phone] Fax: [phone]    Patient notified that their request is being sent to the clinical staff for review and that they should receive a response within 2 business days.   Please advise at Mobile [phone] (mobile)

## 2022-11-25 ENCOUNTER — Encounter: Payer: Medicare HMO | Admitting: Physical Therapy

## 2022-11-29 ENCOUNTER — Encounter: Payer: Medicare HMO | Admitting: Rehabilitative and Restorative Service Providers"

## 2022-11-30 ENCOUNTER — Ambulatory Visit (INDEPENDENT_AMBULATORY_CARE_PROVIDER_SITE_OTHER): Payer: Medicare HMO | Admitting: Orthopaedic Surgery

## 2022-11-30 DIAGNOSIS — M7521 Bicipital tendinitis, right shoulder: Secondary | ICD-10-CM

## 2022-11-30 NOTE — Progress Notes (Signed)
Post Operative Evaluation    Procedure/Date of Surgery: 2 weeks status post right shoulder debridement and biceps tenodesis  Interval History:     Presents today 2 weeks status post the above procedure overall doing extremely well.  Has been able to return to full active overhead range of motion.  Denies any pain in the shoulder at this time   PMH/PSH/Family History/Social History/Meds/Allergies:    Past Medical History:  Diagnosis Date   Anxiety disorder 09/09/2019   B12 deficiency    Cancer (HCC)    cervical cancer   Depression    Diverticulitis    Fatty liver    GERD (gastroesophageal reflux disease)    Hyperlipidemia    Hypertension    Insomnia 09/09/2019   Liver cyst    Vitamin D deficiency    Past Surgical History:  Procedure Laterality Date   ABDOMINAL HYSTERECTOMY     COLON RESECTION     due to diverticulitis   SHOULDER ARTHROSCOPY WITH ROTATOR CUFF REPAIR Right 11/15/2022   Procedure: RIGHT SHOULDER ARTHROSCOPY WITH BICEPS TENODESIS;  Surgeon: Huel Cote, MD;  Location: [facility];  Service: Orthopedics;  Laterality: Right;   Social History   Socioeconomic History   Marital status: Single    Spouse name: Not on file   Number of children: 1   Years of education: Not on file   Highest education level: Not on file  Occupational History   Occupation: retired   Occupation: retired  Tobacco Use   Smoking status: Light Smoker    Packs/day: .5    Types: Cigarettes   Smokeless tobacco: Never  Vaping Use   Vaping Use: Never used  Substance and Sexual Activity   Alcohol use: Not Currently    Comment: occasional beer or bloody mary once a week   Drug use: Never   Sexual activity: Not on file  Other Topics Concern   Not on file  Social History Narrative   Not on file   Social Determinants of Health   Financial Resource Strain: Low Risk  (03/24/2022)   Overall Financial Resource Strain (CARDIA)     Difficulty of Paying Living Expenses: Not very hard  Recent Concern: Physicist, medical Strain - High Risk (02/16/2022)   Overall Financial Resource Strain (CARDIA)    Difficulty of Paying Living Expenses: Hard  Food Insecurity: No Food Insecurity (03/24/2022)   Hunger Vital Sign    Worried About Running Out of Food in the Last Year: Never true    Ran Out of Food in the Last Year: Never true  Recent Concern: Food Insecurity - Food Insecurity Present (02/16/2022)   Hunger Vital Sign    Worried About Running Out of Food in the Last Year: Sometimes true    Ran Out of Food in the Last Year: Sometimes true  Transportation Needs: No Transportation Needs (03/24/2022)   PRAPARE - Administrator, Civil Service (Medical): No    Lack of Transportation (Non-Medical): No  Physical Activity: Sufficiently Active (03/24/2022)   Exercise Vital Sign    Days of Exercise per Week: 5 days    Minutes of Exercise per Session: 30 min  Stress: Stress Concern Present (03/24/2022)   [employer] of Occupational Health - Occupational Stress Questionnaire    Feeling of Stress : To some extent  Social Connections: Socially Isolated (03/24/2022)   Social Connection and Isolation Panel [NHANES]    Frequency of Communication with Friends and Family: More than three times a week    Frequency of Social Gatherings with Friends and Family: Three times a week    Attends Religious Services: Never    Active Member of Clubs or Organizations: No    Attends Banker Meetings: Never    Marital Status: Never married   Family History  Problem Relation Age of Onset   Colon cancer Neg Hx    Esophageal cancer Neg Hx    Stomach cancer Neg Hx    Rectal cancer Neg Hx    Allergies  Allergen Reactions   Lisinopril Swelling   Losartan Swelling   Pseudoephedrine-Naproxen Na Er     Other Reaction(s): Other (See Comments)   Current Outpatient Medications  Medication Sig Dispense Refill   amLODipine  (NORVASC) 5 MG tablet Take 1 tablet (5 mg total) by mouth daily. 90 tablet 1   Artificial Saliva (BIOTENE DRY MOUTH MOISTURIZING) SOLN USe daily as needed for dry mouth 443 mL 0   aspirin EC 325 MG tablet Take 1 tablet (325 mg total) by mouth daily. 14 tablet 0   atorvastatin (LIPITOR) 20 MG tablet TAKE 1 TABLET DAILY 90 tablet 1   azelastine (ASTELIN) 0.1 % nasal spray Place 2 sprays into both nostrils 2 (two) times daily. Use in each nostril as directed 30 mL 5   buPROPion (WELLBUTRIN XL) 150 MG 24 hr tablet TAKE 1 TABLET DAILY 90 tablet 1   diclofenac Sodium (VOLTAREN) 1 % GEL Apply 4 g topically 4 (four) times daily as needed. 100 g 3   esomeprazole (NEXIUM) 40 MG capsule Take 1 capsule (40 mg total) by mouth daily. 30 capsule 2   fluticasone (FLONASE) 50 MCG/ACT nasal spray Place into both nostrils.     furosemide (LASIX) 20 MG tablet Take 1 tablet (20 mg total) by mouth 2 (two) times daily. 180 tablet 3   gabapentin (NEURONTIN) 300 MG capsule TAKE 2 CAPSULES(600MG       TOTAL) 3 TIMES A DAY 540 capsule 1   Hot/Cold Therapy Aids (HOT/COLD PACK) PADS Use as directed 3 each 0   Humidifiers MISC Use as needed to prevent dry mouth 1 each 0   levocetirizine (XYZAL) 5 MG tablet Take 5 mg by mouth 2 (two) times daily as needed.     oxyCODONE (OXY IR/ROXICODONE) 5 MG immediate release tablet Take 1 tablet (5 mg total) by mouth every 4 (four) hours as needed (severe pain). 20 tablet 0   pilocarpine (SALAGEN) 5 MG tablet Take 1 tablet (5 mg total) by mouth 2 (two) times daily. 60 tablet 1   potassium chloride SA (KLOR-CON M) 20 MEQ tablet Take 1 tablet (20 mEq total) by mouth 2 (two) times daily. 180 tablet 1   sertraline (ZOLOFT) 50 MG tablet Take 1.5 tablets (75 mg total) by mouth at bedtime. 135 tablet 3   tizanidine (ZANAFLEX) 2 MG capsule Take 1 capsule (2 mg total) by mouth 3 (three) times daily. 30 capsule 1   traZODone (DESYREL) 100 MG tablet Take 1 tablet (100 mg total) by mouth at bedtime as  needed for sleep. Please note, did larger dose so only 1 tab 90 tablet 3   No current facility-administered medications for this visit.   No results found.  Review of Systems:   A ROS was performed including pertinent positives and negatives as documented in  the HPI.   Musculoskeletal Exam:    There were no vitals taken for this visit.  Incisions are well-appearing without erythema or drainage.  Forward elevation is to 170 degrees equal to the contralateral side.  No tenderness over the biceps tendon.  There is some bruising about shoulder  Imaging:      I personally reviewed and interpreted the radiographs.   Assessment:   2 weeks status post right shoulder biceps tenodesis and subacromial debridement with rotator cuff debridement overall doing extremely well.  This time she has really no limitations or restrictions.  I will plan to see her back as needed return to clinic as needed  Plan :    -Return to clinic as needed      I personally saw and evaluated the patient, and participated in the management and treatment plan.  Huel Cote, MD Attending Physician, Orthopedic Surgery  This document was dictated using Dragon voice recognition software. A reasonable attempt at proof reading has been made to minimize errors.

## 2022-12-01 ENCOUNTER — Encounter: Payer: Medicare HMO | Admitting: Rehabilitative and Restorative Service Providers"

## 2022-12-01 ENCOUNTER — Telehealth: Payer: Self-pay | Admitting: Rehabilitative and Restorative Service Providers"

## 2022-12-01 NOTE — Telephone Encounter (Signed)
Called patient about no show today.  She indicated the MD released her from her MD care.  Discussed with her about checking her strength on June 4th (date for strengthening to be allowed).  She was in agreement to make sure she was progressing in active movement/strength at that time.   Chyrel Masson, PT, DPT, OCS, ATC 12/01/22  10:42 AM

## 2022-12-03 ENCOUNTER — Encounter (HOSPITAL_BASED_OUTPATIENT_CLINIC_OR_DEPARTMENT_OTHER): Payer: Medicare HMO

## 2022-12-07 ENCOUNTER — Ambulatory Visit (INDEPENDENT_AMBULATORY_CARE_PROVIDER_SITE_OTHER): Payer: Medicare HMO | Admitting: Orthopaedic Surgery

## 2022-12-07 ENCOUNTER — Encounter: Payer: Medicare HMO | Admitting: Physical Therapy

## 2022-12-07 DIAGNOSIS — M7521 Bicipital tendinitis, right shoulder: Secondary | ICD-10-CM

## 2022-12-07 NOTE — Progress Notes (Signed)
Post Operative Evaluation    Procedure/Date of Surgery: 2 weeks status post right shoulder debridement and biceps tenodesis  Interval History:     She is here today she does have 1 retained stitch   PMH/PSH/Family History/Social History/Meds/Allergies:    Past Medical History:  Diagnosis Date   Anxiety disorder 09/09/2019   B12 deficiency    Cancer (HCC)    cervical cancer   Depression    Diverticulitis    Fatty liver    GERD (gastroesophageal reflux disease)    Hyperlipidemia    Hypertension    Insomnia 09/09/2019   Liver cyst    Vitamin D deficiency    Past Surgical History:  Procedure Laterality Date   ABDOMINAL HYSTERECTOMY     COLON RESECTION     due to diverticulitis   SHOULDER ARTHROSCOPY WITH ROTATOR CUFF REPAIR Right 11/15/2022   Procedure: RIGHT SHOULDER ARTHROSCOPY WITH BICEPS TENODESIS;  Surgeon: Huel Cote, MD;  Location: [facility];  Service: Orthopedics;  Laterality: Right;   Social History   Socioeconomic History   Marital status: Single    Spouse name: Not on file   Number of children: 1   Years of education: Not on file   Highest education level: Not on file  Occupational History   Occupation: retired   Occupation: retired  Tobacco Use   Smoking status: Light Smoker    Packs/day: .5    Types: Cigarettes   Smokeless tobacco: Never  Vaping Use   Vaping Use: Never used  Substance and Sexual Activity   Alcohol use: Not Currently    Comment: occasional beer or bloody mary once a week   Drug use: Never   Sexual activity: Not on file  Other Topics Concern   Not on file  Social History Narrative   Not on file   Social Determinants of Health   Financial Resource Strain: Low Risk  (03/24/2022)   Overall Financial Resource Strain (CARDIA)    Difficulty of Paying Living Expenses: Not very hard  Recent Concern: Physicist, medical Strain - High Risk (02/16/2022)   Overall Financial Resource  Strain (CARDIA)    Difficulty of Paying Living Expenses: Hard  Food Insecurity: No Food Insecurity (03/24/2022)   Hunger Vital Sign    Worried About Running Out of Food in the Last Year: Never true    Ran Out of Food in the Last Year: Never true  Recent Concern: Food Insecurity - Food Insecurity Present (02/16/2022)   Hunger Vital Sign    Worried About Running Out of Food in the Last Year: Sometimes true    Ran Out of Food in the Last Year: Sometimes true  Transportation Needs: No Transportation Needs (03/24/2022)   PRAPARE - Administrator, Civil Service (Medical): No    Lack of Transportation (Non-Medical): No  Physical Activity: Sufficiently Active (03/24/2022)   Exercise Vital Sign    Days of Exercise per Week: 5 days    Minutes of Exercise per Session: 30 min  Stress: Stress Concern Present (03/24/2022)   [employer] of Occupational Health - Occupational Stress Questionnaire    Feeling of Stress : To some extent  Social Connections: Socially Isolated (03/24/2022)   Social Connection and Isolation Panel [NHANES]    Frequency of Communication with Friends and Family: More than  three times a week    Frequency of Social Gatherings with Friends and Family: Three times a week    Attends Religious Services: Never    Active Member of Clubs or Organizations: No    Attends Banker Meetings: Never    Marital Status: Never married   Family History  Problem Relation Age of Onset   Colon cancer Neg Hx    Esophageal cancer Neg Hx    Stomach cancer Neg Hx    Rectal cancer Neg Hx    Allergies  Allergen Reactions   Lisinopril Swelling   Losartan Swelling   Pseudoephedrine-Naproxen Na Er     Other Reaction(s): Other (See Comments)   Current Outpatient Medications  Medication Sig Dispense Refill   amLODipine (NORVASC) 5 MG tablet Take 1 tablet (5 mg total) by mouth daily. 90 tablet 1   Artificial Saliva (BIOTENE DRY MOUTH MOISTURIZING) SOLN USe daily as needed  for dry mouth 443 mL 0   aspirin EC 325 MG tablet Take 1 tablet (325 mg total) by mouth daily. 14 tablet 0   atorvastatin (LIPITOR) 20 MG tablet TAKE 1 TABLET DAILY 90 tablet 1   azelastine (ASTELIN) 0.1 % nasal spray Place 2 sprays into both nostrils 2 (two) times daily. Use in each nostril as directed 30 mL 5   buPROPion (WELLBUTRIN XL) 150 MG 24 hr tablet TAKE 1 TABLET DAILY 90 tablet 1   diclofenac Sodium (VOLTAREN) 1 % GEL Apply 4 g topically 4 (four) times daily as needed. 100 g 3   esomeprazole (NEXIUM) 40 MG capsule Take 1 capsule (40 mg total) by mouth daily. 30 capsule 2   fluticasone (FLONASE) 50 MCG/ACT nasal spray Place into both nostrils.     furosemide (LASIX) 20 MG tablet Take 1 tablet (20 mg total) by mouth 2 (two) times daily. 180 tablet 3   gabapentin (NEURONTIN) 300 MG capsule TAKE 2 CAPSULES(600MG       TOTAL) 3 TIMES A DAY 540 capsule 1   Hot/Cold Therapy Aids (HOT/COLD PACK) PADS Use as directed 3 each 0   Humidifiers MISC Use as needed to prevent dry mouth 1 each 0   levocetirizine (XYZAL) 5 MG tablet Take 5 mg by mouth 2 (two) times daily as needed.     oxyCODONE (OXY IR/ROXICODONE) 5 MG immediate release tablet Take 1 tablet (5 mg total) by mouth every 4 (four) hours as needed (severe pain). 20 tablet 0   pilocarpine (SALAGEN) 5 MG tablet Take 1 tablet (5 mg total) by mouth 2 (two) times daily. 60 tablet 1   potassium chloride SA (KLOR-CON M) 20 MEQ tablet Take 1 tablet (20 mEq total) by mouth 2 (two) times daily. 180 tablet 1   sertraline (ZOLOFT) 50 MG tablet Take 1.5 tablets (75 mg total) by mouth at bedtime. 135 tablet 3   tizanidine (ZANAFLEX) 2 MG capsule Take 1 capsule (2 mg total) by mouth 3 (three) times daily. 30 capsule 1   traZODone (DESYREL) 100 MG tablet Take 1 tablet (100 mg total) by mouth at bedtime as needed for sleep. Please note, did larger dose so only 1 tab 90 tablet 3   No current facility-administered medications for this visit.   No results  found.  Review of Systems:   A ROS was performed including pertinent positives and negatives as documented in the HPI.   Musculoskeletal Exam:    There were no vitals taken for this visit.  Incisions are well-appearing without erythema or drainage.  Forward elevation is to 170 degrees equal to the contralateral side.  No tenderness over the biceps tendon.  There is some bruising about shoulder  Imaging:      I personally reviewed and interpreted the radiographs.   Assessment:   2 weeks status post right shoulder biceps tenodesis and subacromial debridement with rotator cuff debridement overall doing extremely well.  Retained suture was removed and she will follow-up as needed  Plan :    -Return to clinic as needed      I personally saw and evaluated the patient, and participated in the management and treatment plan.  Huel Cote, MD Attending Physician, Orthopedic Surgery  This document was dictated using Dragon voice recognition software. A reasonable attempt at proof reading has been made to minimize errors.

## 2022-12-09 ENCOUNTER — Encounter: Payer: Medicare HMO | Admitting: Physical Therapy

## 2022-12-09 ENCOUNTER — Encounter (HOSPITAL_BASED_OUTPATIENT_CLINIC_OR_DEPARTMENT_OTHER): Payer: Medicare HMO | Admitting: Physical Therapy

## 2022-12-12 ENCOUNTER — Telehealth: Payer: Self-pay | Admitting: Orthopaedic Surgery

## 2022-12-12 ENCOUNTER — Other Ambulatory Visit (HOSPITAL_BASED_OUTPATIENT_CLINIC_OR_DEPARTMENT_OTHER): Payer: Self-pay | Admitting: Orthopaedic Surgery

## 2022-12-12 DIAGNOSIS — M7521 Bicipital tendinitis, right shoulder: Secondary | ICD-10-CM

## 2022-12-12 NOTE — Telephone Encounter (Signed)
Patient called asked for a call back concerning her right arm. Patient said she still have pain in her arm. Patient want to explain what she is feeling in her arm. The number to contact patient is [phone]

## 2022-12-13 ENCOUNTER — Encounter: Payer: Medicare HMO | Admitting: Rehabilitative and Restorative Service Providers"

## 2022-12-14 ENCOUNTER — Encounter (HOSPITAL_BASED_OUTPATIENT_CLINIC_OR_DEPARTMENT_OTHER): Payer: Medicare HMO | Admitting: Physical Therapy

## 2022-12-15 ENCOUNTER — Encounter: Payer: Medicare HMO | Admitting: Rehabilitative and Restorative Service Providers"

## 2022-12-17 ENCOUNTER — Encounter (HOSPITAL_BASED_OUTPATIENT_CLINIC_OR_DEPARTMENT_OTHER): Payer: Medicare HMO

## 2022-12-19 ENCOUNTER — Other Ambulatory Visit (HOSPITAL_BASED_OUTPATIENT_CLINIC_OR_DEPARTMENT_OTHER): Payer: Self-pay | Admitting: Orthopaedic Surgery

## 2022-12-19 ENCOUNTER — Telehealth (HOSPITAL_BASED_OUTPATIENT_CLINIC_OR_DEPARTMENT_OTHER): Payer: Self-pay | Admitting: Orthopaedic Surgery

## 2022-12-19 MED ORDER — METHYLPREDNISOLONE 4 MG PO TBPK: ORAL_TABLET | ORAL | 0 refills | Status: DC

## 2022-12-19 NOTE — Telephone Encounter (Signed)
Post op question 

## 2022-12-20 ENCOUNTER — Ambulatory Visit (INDEPENDENT_AMBULATORY_CARE_PROVIDER_SITE_OTHER): Payer: Medicare HMO | Admitting: Family Medicine

## 2022-12-20 ENCOUNTER — Encounter: Payer: Medicare HMO | Admitting: Rehabilitative and Restorative Service Providers"

## 2022-12-20 ENCOUNTER — Encounter: Payer: Self-pay | Admitting: Family Medicine

## 2022-12-20 VITALS — BP 130/60 | HR 77 | Temp 98.3°F | Resp 18 | Ht 61.0 in | Wt 133.1 lb

## 2022-12-20 DIAGNOSIS — E7800 Pure hypercholesterolemia, unspecified: Secondary | ICD-10-CM

## 2022-12-20 DIAGNOSIS — R413 Other amnesia: Secondary | ICD-10-CM

## 2022-12-20 DIAGNOSIS — R29898 Other symptoms and signs involving the musculoskeletal system: Secondary | ICD-10-CM

## 2022-12-20 DIAGNOSIS — E538 Deficiency of other specified B group vitamins: Secondary | ICD-10-CM | POA: Diagnosis not present

## 2022-12-20 DIAGNOSIS — Z79899 Other long term (current) drug therapy: Secondary | ICD-10-CM | POA: Diagnosis not present

## 2022-12-20 DIAGNOSIS — M25511 Pain in right shoulder: Secondary | ICD-10-CM

## 2022-12-20 DIAGNOSIS — R58 Hemorrhage, not elsewhere classified: Secondary | ICD-10-CM

## 2022-12-20 DIAGNOSIS — K219 Gastro-esophageal reflux disease without esophagitis: Secondary | ICD-10-CM | POA: Diagnosis not present

## 2022-12-20 LAB — COMPREHENSIVE METABOLIC PANEL
ALT: 11 U/L (ref 0–35)
AST: 13 U/L (ref 0–37)
Albumin: 4.4 g/dL (ref 3.5–5.2)
Alkaline Phosphatase: 73 U/L (ref 39–117)
BUN: 13 mg/dL (ref 6–23)
CO2: 26 mEq/L (ref 19–32)
Calcium: 9.5 mg/dL (ref 8.4–10.5)
Chloride: 104 mEq/L (ref 96–112)
Creatinine, Ser: 0.9 mg/dL (ref 0.40–1.20)
GFR: 65.66 mL/min (ref >60.00)
Glucose, Bld: 93 mg/dL (ref 70–99)
Potassium: 4.1 mEq/L (ref 3.5–5.1)
Sodium: 140 mEq/L (ref 135–145)
Total Bilirubin: 0.4 mg/dL (ref 0.2–1.2)
Total Protein: 6.8 g/dL (ref 6.0–8.3)

## 2022-12-20 LAB — CBC WITH DIFFERENTIAL/PLATELET
Basophils Absolute: 0.1 10*3/uL (ref 0.0–0.1)
Basophils Relative: 0.6 % (ref 0.0–3.0)
Eosinophils Absolute: 0 10*3/uL (ref 0.0–0.7)
Eosinophils Relative: 0.2 % (ref 0.0–5.0)
HCT: 38.7 % (ref 36.0–46.0)
Hemoglobin: 12.7 g/dL (ref 12.0–15.0)
Lymphocytes Relative: 12.5 % (ref 12.0–46.0)
Lymphs Abs: 1.3 10*3/uL (ref 0.7–4.0)
MCHC: 32.9 g/dL (ref 30.0–36.0)
MCV: 94.3 fl (ref 78.0–100.0)
Monocytes Absolute: 0.5 10*3/uL (ref 0.1–1.0)
Monocytes Relative: 4.9 % (ref 3.0–12.0)
Neutro Abs: 8.5 10*3/uL — ABNORMAL HIGH (ref 1.4–7.7)
Neutrophils Relative %: 81.8 % — ABNORMAL HIGH (ref 43.0–77.0)
Platelets: 286 10*3/uL (ref 150.0–400.0)
RBC: 4.11 Mil/uL (ref 3.87–5.11)
RDW: 13.6 % (ref 11.5–15.5)
WBC: 10.4 10*3/uL (ref 4.0–10.5)

## 2022-12-20 LAB — VITAMIN B12: Vitamin B-12: 291 pg/mL (ref 211–911)

## 2022-12-20 LAB — LIPID PANEL
Cholesterol: 159 mg/dL (ref 0–200)
HDL: 70.3 mg/dL (ref >39.00)
LDL Cholesterol: 76 mg/dL (ref 0–99)
NonHDL: 89.06
Total CHOL/HDL Ratio: 2
Triglycerides: 66 mg/dL (ref 0.0–149.0)
VLDL: 13.2 mg/dL (ref 0.0–40.0)

## 2022-12-20 LAB — MAGNESIUM: Magnesium: 1.7 mg/dL (ref 1.5–2.5)

## 2022-12-20 MED ORDER — ESOMEPRAZOLE MAGNESIUM 40 MG PO CPDR: 40.0000 mg | DELAYED_RELEASE_CAPSULE | Freq: Every day | ORAL | 1 refills | Status: DC

## 2022-12-20 MED ORDER — CYANOCOBALAMIN 1000 MCG/ML IJ SOLN
1000.0000 ug | Freq: Once | INTRAMUSCULAR | Status: AC
Start: 2022-12-20 — End: 2022-12-20
  Administered 2022-12-20: 1000 ug via INTRAMUSCULAR

## 2022-12-20 MED ORDER — POTASSIUM CHLORIDE CRYS ER 20 MEQ PO TBCR: 20.0000 meq | EXTENDED_RELEASE_TABLET | Freq: Two times a day (BID) | ORAL | 1 refills | Status: DC

## 2022-12-20 MED ORDER — ATORVASTATIN CALCIUM 40 MG PO TABS: 40.0000 mg | ORAL_TABLET | Freq: Every day | ORAL | 1 refills | Status: DC

## 2022-12-20 MED ORDER — ESOMEPRAZOLE MAGNESIUM 40 MG PO CPDR: 40.0000 mg | DELAYED_RELEASE_CAPSULE | Freq: Every day | ORAL | 2 refills | Status: DC

## 2022-12-20 MED ORDER — KETOROLAC TROMETHAMINE 60 MG/2ML IM SOLN
60.0000 mg | Freq: Once | INTRAMUSCULAR | Status: AC
Start: 2022-12-20 — End: 2022-12-20
  Administered 2022-12-20: 60 mg via INTRAMUSCULAR

## 2022-12-20 NOTE — Patient Instructions (Signed)
See Dr. Denyse Amass about the hands.

## 2022-12-20 NOTE — Progress Notes (Signed)
Subjective:     Patient ID: Tracey Saunders, female    DOB: 05-Oct-1955, 67 y.o.   MRN: [id number]  Chief Complaint  Patient presents with   Memory Issues    Having a hard time getting stuff out when trying to speak to someone   Pain    Pain in right shoulder, had surgery recently    HPI  Pain right shoulder-had surgery recently.  Was doing well, but then different type of pain.  Called orthopedic and told inflammation and on medrol dosepack. Requesting Toradol shot.  Sees orthopedic on 6/19.   Memory issues-long time.  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  Forgets things easily, forgets what "needs to do".  Forgets to pay bills at times.  Hands-can't pick things up and things fall.  Hands will just "jerk".  Going on for at least a year. No pain. Hard to hang on to stuff-eating a problem.  Bruising easily on arms.  No injuries.  Nowhere else.  GERD-doing well on Nexium.   HLD-taking 40 mg lipitor  Health Maintenance Due  Topic Date Due   DTaP/Tdap/Td (1 - Tdap) Never done    Past Medical History:  Diagnosis Date   Anxiety disorder 09/09/2019   B12 deficiency    Cancer (HCC)    cervical cancer   Depression    Diverticulitis    Fatty liver    GERD (gastroesophageal reflux disease)    Hyperlipidemia    Hypertension    Insomnia 09/09/2019   Liver cyst    Vitamin D deficiency     Past Surgical History:  Procedure Laterality Date   ABDOMINAL HYSTERECTOMY     COLON RESECTION     due to diverticulitis   SHOULDER ARTHROSCOPY WITH ROTATOR CUFF REPAIR Right 11/15/2022   Procedure: RIGHT SHOULDER ARTHROSCOPY WITH BICEPS TENODESIS;  Surgeon: Huel Cote, MD;  Location: [facility];  Service: Orthopedics;  Laterality: Right;     Current Outpatient Medications:    amLODipine (NORVASC) 5 MG tablet, Take 1 tablet (5 mg total) by mouth daily., Disp: 90 tablet, Rfl: 1   Artificial Saliva (BIOTENE DRY MOUTH MOISTURIZING) SOLN, USe daily as  needed for dry mouth, Disp: 443 mL, Rfl: 0   aspirin EC 325 MG tablet, Take 1 tablet (325 mg total) by mouth daily., Disp: 14 tablet, Rfl: 0   azelastine (ASTELIN) 0.1 % nasal spray, Place 2 sprays into both nostrils 2 (two) times daily. Use in each nostril as directed, Disp: 30 mL, Rfl: 5   buPROPion (WELLBUTRIN XL) 150 MG 24 hr tablet, TAKE 1 TABLET DAILY, Disp: 90 tablet, Rfl: 1   diclofenac Sodium (VOLTAREN) 1 % GEL, Apply 4 g topically 4 (four) times daily as needed., Disp: 100 g, Rfl: 3   fluticasone (FLONASE) 50 MCG/ACT nasal spray, Place into both nostrils., Disp: , Rfl:    furosemide (LASIX) 20 MG tablet, Take 1 tablet (20 mg total) by mouth 2 (two) times daily., Disp: 180 tablet, Rfl: 3   gabapentin (NEURONTIN) 300 MG capsule, TAKE 2 CAPSULES(600MG       TOTAL) 3 TIMES A DAY, Disp: 540 capsule, Rfl: 1   Hot/Cold Therapy Aids (HOT/COLD PACK) PADS, Use as directed, Disp: 3 each, Rfl: 0   Humidifiers MISC, Use as needed to prevent dry mouth, Disp: 1 each, Rfl: 0   levocetirizine (XYZAL) 5 MG tablet, Take 5 mg by mouth 2 (two) times daily as needed., Disp: , Rfl:  methylPREDNISolone (MEDROL DOSEPAK) 4 MG TBPK tablet, Take per packet instructions, Disp: 1 each, Rfl: 0   pilocarpine (SALAGEN) 5 MG tablet, Take 1 tablet (5 mg total) by mouth 2 (two) times daily., Disp: 60 tablet, Rfl: 1   sertraline (ZOLOFT) 50 MG tablet, Take 1.5 tablets (75 mg total) by mouth at bedtime., Disp: 135 tablet, Rfl: 3   tizanidine (ZANAFLEX) 2 MG capsule, Take 1 capsule (2 mg total) by mouth 3 (three) times daily., Disp: 30 capsule, Rfl: 1   traZODone (DESYREL) 100 MG tablet, Take 1 tablet (100 mg total) by mouth at bedtime as needed for sleep. Please note, did larger dose so only 1 tab, Disp: 90 tablet, Rfl: 3   atorvastatin (LIPITOR) 40 MG tablet, Take 1 tablet (40 mg total) by mouth daily., Disp: 90 tablet, Rfl: 1   esomeprazole (NEXIUM) 40 MG capsule, Take 1 capsule (40 mg total) by mouth daily., Disp: 90  capsule, Rfl: 1   potassium chloride SA (KLOR-CON M) 20 MEQ tablet, Take 1 tablet (20 mEq total) by mouth 2 (two) times daily., Disp: 180 tablet, Rfl: 1  Current Facility-Administered Medications:    ketorolac (TORADOL) injection 60 mg, 60 mg, Intramuscular, Once, Jeani Sow, MD  Allergies  Allergen Reactions   Lisinopril Swelling   Losartan Swelling   Pseudoephedrine-Naproxen Na Er     Other Reaction(s): Other (See Comments)   ROS neg/noncontributory except as noted HPI/below Losing weight-only eats 1-2x/day.       Objective:     BP 130/60   Pulse 77   Temp 98.3 F (36.8 C) (Temporal)   Resp 18   Ht 5\' 1"  (1.549 m)   Wt 133 lb 2 oz (60.4 kg)   SpO2 98%   BMI 25.15 kg/m  Wt Readings from Last 3 Encounters:  12/20/22 133 lb 2 oz (60.4 kg)  11/15/22 137 lb 12.6 oz (62.5 kg)  10/13/22 142 lb (64.4 kg)    Physical Exam   Gen: WDWN NAD HEENT: NCAT, conjunctiva not injected, sclera nonicteric NECK:  supple, no thyromegaly, no nodes, no carotid bruits CARDIAC: RRR, S1S2+ EXT:  no edema MSK: no gross abnormalities.  NEURO: A&O x3.  CN II-XII intact.  Tremors hands, worse w/outstretched, some lip tremors.  Decreased strength in B hands.  Sens intact.  Pain when moves right shoulder PSYCH: normal mood. Good eye contact Some ecchymoses B forearms.       Assessment & Plan:  Pure hypercholesterolemia Assessment & Plan: Chronic.  Not controlled.  Patient was off Lipitor and now back on 40 mg daily. Check labs  Orders: -     Comprehensive metabolic panel -     Lipid panel  Gastroesophageal reflux disease without esophagitis Assessment & Plan: Chronic.  Controlled w/protonix 40 mg daily-continue  Orders: -     CBC with Differential/Platelet  Acute pain of right shoulder -     Ketorolac Tromethamine  Vitamin B12 deficiency Assessment & Plan: Chronic.  B12 injection given today after labs drawn  Orders: -     CBC with Differential/Platelet -     Vitamin  B12  Ecchymosis  High risk medication use -     CBC with Differential/Platelet -     Comprehensive metabolic panel -     Magnesium  Hand weakness  Memory deficit  Other orders -     Esomeprazole Magnesium; Take 1 capsule (40 mg total) by mouth daily.  Dispense: 90 capsule; Refill: 1 -  Potassium Chloride Crys ER; Take 1 tablet (20 mEq total) by mouth 2 (two) times daily.  Dispense: 180 tablet; Refill: 1 -     Atorvastatin Calcium; Take 1 tablet (40 mg total) by mouth daily.  Dispense: 90 tablet; Refill: 1  Hand weakness-follow up sports medicine and orthopedic.  Pain right shoulder-seeing orthopedic-Toradol 60 mg given IM today. Ecchymosis-check cbcd.   Memory deficit.  Check labs.  Keep notes.    Return in about 2 months (around 02/19/2023) for HTN, cholesterol.  Angelena Sole, MD

## 2022-12-20 NOTE — Assessment & Plan Note (Signed)
Chronic.  Not controlled.  Patient was off Lipitor and now back on 40 mg daily. Check labs

## 2022-12-20 NOTE — Assessment & Plan Note (Signed)
Chronic.  B12 injection given today after labs drawn

## 2022-12-20 NOTE — Assessment & Plan Note (Signed)
Chronic.  Controlled w/protonix 40 mg daily-continue

## 2022-12-20 NOTE — Progress Notes (Signed)
Labs ok.  B12 on low side so glad got shot again

## 2022-12-21 ENCOUNTER — Telehealth: Payer: Self-pay | Admitting: Family Medicine

## 2022-12-21 ENCOUNTER — Encounter (HOSPITAL_BASED_OUTPATIENT_CLINIC_OR_DEPARTMENT_OTHER): Payer: Medicare HMO | Admitting: Physical Therapy

## 2022-12-21 NOTE — Telephone Encounter (Signed)
Patient requests to be called to discuss Lab results received on MyChart

## 2022-12-21 NOTE — Telephone Encounter (Signed)
Spoke with patient, went over results and recommendations. Patient verbalized understanding.

## 2022-12-22 ENCOUNTER — Encounter: Payer: Medicare HMO | Admitting: Rehabilitative and Restorative Service Providers"

## 2022-12-23 ENCOUNTER — Encounter: Payer: Self-pay | Admitting: *Deleted

## 2022-12-24 ENCOUNTER — Encounter (HOSPITAL_BASED_OUTPATIENT_CLINIC_OR_DEPARTMENT_OTHER): Payer: Medicare HMO

## 2022-12-27 ENCOUNTER — Encounter: Payer: Medicare HMO | Admitting: Rehabilitative and Restorative Service Providers"

## 2022-12-28 ENCOUNTER — Ambulatory Visit (INDEPENDENT_AMBULATORY_CARE_PROVIDER_SITE_OTHER): Payer: Medicare HMO | Admitting: Orthopaedic Surgery

## 2022-12-28 DIAGNOSIS — M7521 Bicipital tendinitis, right shoulder: Secondary | ICD-10-CM

## 2022-12-28 NOTE — Progress Notes (Signed)
Post Operative Evaluation    Procedure/Date of Surgery: 6 weeks status post right shoulder debridement and biceps tenodesis  Interval History:   Today status post the above procedure.  Overall she is doing much better after Medrol Dosepak.  She has been able to reestablish full painless range of motion.  She is overall very satisfied with her progress.   PMH/PSH/Family History/Social History/Meds/Allergies:    Past Medical History:  Diagnosis Date   Anxiety disorder 09/09/2019   B12 deficiency    Cancer (HCC)    cervical cancer   Depression    Diverticulitis    Fatty liver    GERD (gastroesophageal reflux disease)    Hyperlipidemia    Hypertension    Insomnia 09/09/2019   Liver cyst    Vitamin D deficiency    Past Surgical History:  Procedure Laterality Date   ABDOMINAL HYSTERECTOMY     COLON RESECTION     due to diverticulitis   SHOULDER ARTHROSCOPY WITH ROTATOR CUFF REPAIR Right 11/15/2022   Procedure: RIGHT SHOULDER ARTHROSCOPY WITH BICEPS TENODESIS;  Surgeon: Huel Cote, MD;  Location: [facility];  Service: Orthopedics;  Laterality: Right;   Social History   Socioeconomic History   Marital status: Single    Spouse name: Not on file   Number of children: 1   Years of education: Not on file   Highest education level: Not on file  Occupational History   Occupation: retired   Occupation: retired  Tobacco Use   Smoking status: Light Smoker    Packs/day: .5    Types: Cigarettes   Smokeless tobacco: Never  Vaping Use   Vaping Use: Never used  Substance and Sexual Activity   Alcohol use: Not Currently    Comment: occasional beer or bloody mary once a week   Drug use: Never   Sexual activity: Not on file  Other Topics Concern   Not on file  Social History Narrative   Not on file   Social Determinants of Health   Financial Resource Strain: Low Risk  (03/24/2022)   Overall Financial Resource Strain  (CARDIA)    Difficulty of Paying Living Expenses: Not very hard  Recent Concern: Physicist, medical Strain - High Risk (02/16/2022)   Overall Financial Resource Strain (CARDIA)    Difficulty of Paying Living Expenses: Hard  Food Insecurity: No Food Insecurity (03/24/2022)   Hunger Vital Sign    Worried About Running Out of Food in the Last Year: Never true    Ran Out of Food in the Last Year: Never true  Recent Concern: Food Insecurity - Food Insecurity Present (02/16/2022)   Hunger Vital Sign    Worried About Running Out of Food in the Last Year: Sometimes true    Ran Out of Food in the Last Year: Sometimes true  Transportation Needs: No Transportation Needs (03/24/2022)   PRAPARE - Administrator, Civil Service (Medical): No    Lack of Transportation (Non-Medical): No  Physical Activity: Sufficiently Active (03/24/2022)   Exercise Vital Sign    Days of Exercise per Week: 5 days    Minutes of Exercise per Session: 30 min  Stress: Stress Concern Present (03/24/2022)   [employer] of Occupational Health - Occupational Stress Questionnaire    Feeling of Stress : To some extent  Social Connections: Socially Isolated (03/24/2022)   Social Connection and Isolation Panel [NHANES]    Frequency of Communication with Friends and Family: More than three times a week    Frequency of Social Gatherings with Friends and Family: Three times a week    Attends Religious Services: Never    Active Member of Clubs or Organizations: No    Attends Banker Meetings: Never    Marital Status: Never married   Family History  Problem Relation Age of Onset   Colon cancer Neg Hx    Esophageal cancer Neg Hx    Stomach cancer Neg Hx    Rectal cancer Neg Hx    Allergies  Allergen Reactions   Lisinopril Swelling   Losartan Swelling   Pseudoephedrine-Naproxen Na Er     Other Reaction(s): Other (See Comments)   Current Outpatient Medications  Medication Sig Dispense Refill    amLODipine (NORVASC) 5 MG tablet Take 1 tablet (5 mg total) by mouth daily. 90 tablet 1   Artificial Saliva (BIOTENE DRY MOUTH MOISTURIZING) SOLN USe daily as needed for dry mouth 443 mL 0   aspirin EC 325 MG tablet Take 1 tablet (325 mg total) by mouth daily. 14 tablet 0   atorvastatin (LIPITOR) 20 MG tablet TAKE 1 TABLET DAILY 90 tablet 1   azelastine (ASTELIN) 0.1 % nasal spray Place 2 sprays into both nostrils 2 (two) times daily. Use in each nostril as directed 30 mL 5   buPROPion (WELLBUTRIN XL) 150 MG 24 hr tablet TAKE 1 TABLET DAILY 90 tablet 1   diclofenac Sodium (VOLTAREN) 1 % GEL Apply 4 g topically 4 (four) times daily as needed. 100 g 3   esomeprazole (NEXIUM) 40 MG capsule Take 1 capsule (40 mg total) by mouth daily. 30 capsule 2   fluticasone (FLONASE) 50 MCG/ACT nasal spray Place into both nostrils.     furosemide (LASIX) 20 MG tablet Take 1 tablet (20 mg total) by mouth 2 (two) times daily. 180 tablet 3   gabapentin (NEURONTIN) 300 MG capsule TAKE 2 CAPSULES(600MG       TOTAL) 3 TIMES A DAY 540 capsule 1   Hot/Cold Therapy Aids (HOT/COLD PACK) PADS Use as directed 3 each 0   Humidifiers MISC Use as needed to prevent dry mouth 1 each 0   levocetirizine (XYZAL) 5 MG tablet Take 5 mg by mouth 2 (two) times daily as needed.     oxyCODONE (OXY IR/ROXICODONE) 5 MG immediate release tablet Take 1 tablet (5 mg total) by mouth every 4 (four) hours as needed (severe pain). 20 tablet 0   pilocarpine (SALAGEN) 5 MG tablet Take 1 tablet (5 mg total) by mouth 2 (two) times daily. 60 tablet 1   potassium chloride SA (KLOR-CON M) 20 MEQ tablet Take 1 tablet (20 mEq total) by mouth 2 (two) times daily. 180 tablet 1   sertraline (ZOLOFT) 50 MG tablet Take 1.5 tablets (75 mg total) by mouth at bedtime. 135 tablet 3   tizanidine (ZANAFLEX) 2 MG capsule Take 1 capsule (2 mg total) by mouth 3 (three) times daily. 30 capsule 1   traZODone (DESYREL) 100 MG tablet Take 1 tablet (100 mg total) by mouth at  bedtime as needed for sleep. Please note, did larger dose so only 1 tab 90 tablet 3   No current facility-administered medications for this visit.   No results found.  Review of Systems:   A ROS was performed including pertinent positives and negatives as documented in  the HPI.   Musculoskeletal Exam:    There were no vitals taken for this visit.  Incisions are well-appearing without erythema or drainage.  Forward elevation is to 170 degrees equal to the contralateral side.  No tenderness over the biceps tendon.  There is some bruising about shoulder  Imaging:      I personally reviewed and interpreted the radiographs.   Assessment:   6 weeks status post right shoulder biceps tenodesis and subacromial debridement with rotator cuff debridement overall doing extremely well.  At this time I will plan to see her back as needed  Plan :    -Return to clinic as needed      I personally saw and evaluated the patient, and participated in the management and treatment plan.  Huel Cote, MD Attending Physician, Orthopedic Surgery  This document was dictated using Dragon voice recognition software. A reasonable attempt at proof reading has been made to minimize errors.

## 2022-12-29 ENCOUNTER — Encounter: Payer: Medicare HMO | Admitting: Rehabilitative and Restorative Service Providers"

## 2023-01-06 ENCOUNTER — Encounter: Payer: Medicare HMO | Admitting: Rehabilitative and Restorative Service Providers"

## 2023-01-09 ENCOUNTER — Telehealth: Payer: Self-pay | Admitting: Family Medicine

## 2023-01-09 ENCOUNTER — Other Ambulatory Visit: Payer: Self-pay | Admitting: Family Medicine

## 2023-01-09 MED ORDER — GABAPENTIN 300 MG PO CAPS: ORAL_CAPSULE | ORAL | 1 refills | Status: DC

## 2023-01-09 NOTE — Telephone Encounter (Signed)
Refill requested for Gabapentin sent to [facility].  Last OV- 6/11 Next OV -8/12

## 2023-01-09 NOTE — Telephone Encounter (Signed)
Prescription Request  01/09/2023  LOV: 12/20/2022  What is the name of the medication or equipment? gabapentin (NEURONTIN) 300 MG capsule   Have you contacted your pharmacy to request a refill? Yes   Which pharmacy would you like this sent to?   [facility] - [place], [place] - [address] [facility] AT Portal to Reg[facility] [address] [place] [address] Phone: [phone] Fax: [phone]     Patient notified that their request is being sent to the clinical staff for review and that they should receive a response within 2 business days.   Please advise at Mobile [phone] (mobile)

## 2023-01-12 ENCOUNTER — Other Ambulatory Visit: Payer: Self-pay | Admitting: Family Medicine

## 2023-01-16 ENCOUNTER — Ambulatory Visit: Payer: Medicare HMO | Admitting: Rehabilitative and Restorative Service Providers"

## 2023-01-19 ENCOUNTER — Encounter: Payer: Medicare HMO | Admitting: Rehabilitative and Restorative Service Providers"

## 2023-01-24 ENCOUNTER — Ambulatory Visit: Payer: Medicare HMO

## 2023-01-24 VITALS — Wt 133.0 lb

## 2023-01-24 DIAGNOSIS — Z0000 Encounter for general adult medical examination without abnormal findings: Secondary | ICD-10-CM

## 2023-01-24 NOTE — Progress Notes (Signed)
Subjective:   Tracey Saunders is a 67 y.o. female who presents for Medicare Annual (Subsequent) preventive examination. Per patient no change in vitals since last visit, unable to obtain new vitals due to telehealth visit  Per patient no change in vitals since last visit, unable to obtain new vitals due to telehealth visit    Visit Complete: Virtual  I connected with  Maurine Minister on 01/24/23 by a audio enabled telemedicine application and verified that I am speaking with the correct person using two identifiers.  Patient Location: Home  Provider Location: Office/Clinic  I discussed the limitations of evaluation and management by telemedicine. The patient expressed understanding and agreed to proceed.  Review of Systems     Cardiac Risk Factors include: advanced age (>92men, >8 women);dyslipidemia;hypertension;smoking/ tobacco exposure     Objective:    Today's Vitals   01/24/23 1019  Weight: 133 lb (60.3 kg)   Body mass index is 25.13 kg/m.     01/24/2023   10:26 AM 11/18/2022    7:58 AM 11/15/2022    7:44 AM 11/07/2022    1:08 PM 03/24/2022   11:41 AM  Advanced Directives  Does Patient Have a Medical Advance Directive? No No No No No  Would patient like information on creating a medical advance directive? No - Patient declined No - Patient declined No - Patient declined No - Patient declined No - Patient declined    Current Medications (verified) Outpatient Encounter Medications as of 01/24/2023  Medication Sig   amLODipine (NORVASC) 5 MG tablet Take 1 tablet (5 mg total) by mouth daily.   Artificial Saliva (BIOTENE DRY MOUTH MOISTURIZING) SOLN USe daily as needed for dry mouth   aspirin EC 325 MG tablet Take 1 tablet (325 mg total) by mouth daily.   atorvastatin (LIPITOR) 20 MG tablet TAKE 1 TABLET DAILY   atorvastatin (LIPITOR) 40 MG tablet Take 1 tablet (40 mg total) by mouth daily.   azelastine (ASTELIN) 0.1 % nasal spray Place 2 sprays into both nostrils 2 (two)  times daily. Use in each nostril as directed   buPROPion (WELLBUTRIN XL) 150 MG 24 hr tablet TAKE 1 TABLET DAILY   diclofenac Sodium (VOLTAREN) 1 % GEL Apply 4 g topically 4 (four) times daily as needed.   esomeprazole (NEXIUM) 40 MG capsule Take 1 capsule (40 mg total) by mouth daily.   fluticasone (FLONASE) 50 MCG/ACT nasal spray Place into both nostrils.   furosemide (LASIX) 20 MG tablet Take 1 tablet (20 mg total) by mouth 2 (two) times daily.   gabapentin (NEURONTIN) 300 MG capsule TAKE 2 CAPSULES(600MG       TOTAL) 3 TIMES A DAY   Hot/Cold Therapy Aids (HOT/COLD PACK) PADS Use as directed   Humidifiers MISC Use as needed to prevent dry mouth   levocetirizine (XYZAL) 5 MG tablet Take 5 mg by mouth 2 (two) times daily as needed.   pilocarpine (SALAGEN) 5 MG tablet Take 1 tablet (5 mg total) by mouth 2 (two) times daily.   potassium chloride SA (KLOR-CON M) 20 MEQ tablet Take 1 tablet (20 mEq total) by mouth 2 (two) times daily.   sertraline (ZOLOFT) 50 MG tablet TAKE 1 AND 1/2 TABLETS AT  BEDTIME   tizanidine (ZANAFLEX) 2 MG capsule Take 1 capsule (2 mg total) by mouth 3 (three) times daily.   traZODone (DESYREL) 100 MG tablet Take 1 tablet (100 mg total) by mouth at bedtime as needed for sleep. Please note, did larger dose so only  1 tab   [DISCONTINUED] methylPREDNISolone (MEDROL DOSEPAK) 4 MG TBPK tablet Take per packet instructions   No facility-administered encounter medications on file as of 01/24/2023.    Allergies (verified) Lisinopril, Losartan, and Pseudoephedrine-naproxen na er   History: Past Medical History:  Diagnosis Date   Anxiety disorder 09/09/2019   B12 deficiency    Cancer (HCC)    cervical cancer   Depression    Diverticulitis    Fatty liver    GERD (gastroesophageal reflux disease)    Hyperlipidemia    Hypertension    Insomnia 09/09/2019   Liver cyst    Vitamin D deficiency    Past Surgical History:  Procedure Laterality Date   ABDOMINAL HYSTERECTOMY      COLON RESECTION     due to diverticulitis   SHOULDER ARTHROSCOPY WITH ROTATOR CUFF REPAIR Right 11/15/2022   Procedure: RIGHT SHOULDER ARTHROSCOPY WITH BICEPS TENODESIS;  Surgeon: Huel Cote, MD;  Location: [facility];  Service: Orthopedics;  Laterality: Right;   Family History  Problem Relation Age of Onset   Colon cancer Neg Hx    Esophageal cancer Neg Hx    Stomach cancer Neg Hx    Rectal cancer Neg Hx    Social History   Socioeconomic History   Marital status: Single    Spouse name: Not on file   Number of children: 1   Years of education: Not on file   Highest education level: Not on file  Occupational History   Occupation: retired   Occupation: retired  Tobacco Use   Smoking status: Light Smoker    Current packs/day: 0.50    Types: Cigarettes   Smokeless tobacco: Never  Vaping Use   Vaping status: Never Used  Substance and Sexual Activity   Alcohol use: Not Currently    Comment: occasional beer or bloody mary once a week   Drug use: Never   Sexual activity: Not on file  Other Topics Concern   Not on file  Social History Narrative   Not on file   Social Determinants of Health   Financial Resource Strain: Low Risk  (01/24/2023)   Overall Financial Resource Strain (CARDIA)    Difficulty of Paying Living Expenses: Not hard at all  Food Insecurity: No Food Insecurity (01/24/2023)   Hunger Vital Sign    Worried About Running Out of Food in the Last Year: Never true    Ran Out of Food in the Last Year: Never true  Transportation Needs: No Transportation Needs (01/24/2023)   PRAPARE - Administrator, Civil Service (Medical): No    Lack of Transportation (Non-Medical): No  Physical Activity: Sufficiently Active (01/24/2023)   Exercise Vital Sign    Days of Exercise per Week: 5 days    Minutes of Exercise per Session: 30 min  Stress: No Stress Concern Present (01/24/2023)   [employer] of Occupational Health - Occupational Stress  Questionnaire    Feeling of Stress : Not at all  Social Connections: Socially Isolated (01/24/2023)   Social Connection and Isolation Panel [NHANES]    Frequency of Communication with Friends and Family: More than three times a week    Frequency of Social Gatherings with Friends and Family: Once a week    Attends Religious Services: Never    Database administrator or Organizations: No    Attends Banker Meetings: Never    Marital Status: Never married    Tobacco Counseling Ready to quit: Not Answered  Counseling given: Not Answered   Clinical Intake:  Pre-visit preparation completed: Yes  Pain : No/denies pain     BMI - recorded: 25.13 Nutritional Status: BMI 25 -29 Overweight Nutritional Risks: None Diabetes: No  How often do you need to have someone help you when you read instructions, pamphlets, or other written materials from your doctor or pharmacy?: 1 - Never  Interpreter Needed?: No  Information entered by :: Lanier Ensign, LPN   Activities of Daily Living    01/24/2023   10:21 AM 11/15/2022    7:47 AM  In your present state of health, do you have any difficulty performing the following activities:  Hearing? 0 0  Vision? 0 0  Difficulty concentrating or making decisions? 0 0  Walking or climbing stairs? 1 0  Comment at times   Dressing or bathing? 0 0  Doing errands, shopping? 0   Preparing Food and eating ? N   Using the Toilet? N   In the past six months, have you accidently leaked urine? N   Do you have problems with loss of bowel control? N   Managing your Medications? N   Managing your Finances? N   Housekeeping or managing your Housekeeping? N     Patient Care Team: Jeani Sow, MD as PCP - General (Family Medicine) Runell Gess, MD as PCP - Cardiology (Cardiology)  Indicate any recent Medical Services you may have received from other than [facility] providers in the past year (date may be approximate).     Assessment:    This is a routine wellness examination for Debany.  Hearing/Vision screen Hearing Screening - Comments:: Pt denies any hearing issues  Vision Screening - Comments:: Pt follows up with Dr Yetta Barre for annual eye exams   Dietary issues and exercise activities discussed:     Goals Addressed             This Visit's Progress    Patient Stated       Lose weight        Depression Screen    01/24/2023   10:24 AM 10/12/2022    3:33 PM 08/11/2022   11:16 AM 07/14/2022   10:22 AM 04/15/2022   11:34 AM 03/24/2022   11:37 AM 03/17/2022   10:41 AM  PHQ 2/9 Scores  PHQ - 2 Score 0 0 0 0 2 0 0  PHQ- 9 Score  2  2 15  0 0    Fall Risk    01/24/2023   10:26 AM 08/11/2022   11:16 AM 03/24/2022   11:41 AM 03/17/2022   10:41 AM 02/16/2022   10:16 AM  Fall Risk   Falls in the past year? 0 0  0 0  Number falls in past yr: 0 0  0 0  Injury with Fall? 0 0  0 0  Risk for fall due to : Impaired vision;Impaired mobility  Impaired vision;Impaired balance/gait;Impaired mobility;History of fall(s) No Fall Risks No Fall Risks  Follow up Falls prevention discussed  Falls prevention discussed  Falls evaluation completed    MEDICARE RISK AT HOME:  Medicare Risk at Home - 01/24/23 1026     Any stairs in or around the home? Yes    If so, are there any without handrails? No    Home free of loose throw rugs in walkways, pet beds, electrical cords, etc? Yes    Adequate lighting in your home to reduce risk of falls? Yes    Life alert? No  Use of a cane, walker or w/c? Yes    Grab bars in the bathroom? No    Shower chair or bench in shower? Yes    Elevated toilet seat or a handicapped toilet? No             TIMED UP AND GO:  Was the test performed?  No    Cognitive Function:        01/24/2023   10:27 AM 03/24/2022   11:44 AM  6CIT Screen  What Year? 0 points 0 points  What month? 0 points 0 points  What time? 0 points 0 points  Count back from 20 0 points 0 points  Months in reverse 0 points 4  points  Repeat phrase 0 points 2 points  Total Score 0 points 6 points    Immunizations Immunization History  Administered Date(s) Administered   PFIZER(Purple Top)SARS-COV-2 Vaccination 11/27/2019, 12/18/2019    TDAP status: Due, Education has been provided regarding the importance of this vaccine. Advised may receive this vaccine at local pharmacy or Health Dept. Aware to provide a copy of the vaccination record if obtained from local pharmacy or Health Dept. Verbalized acceptance and understanding.  Flu Vaccine status: Due, Education has been provided regarding the importance of this vaccine. Advised may receive this vaccine at local pharmacy or Health Dept. Aware to provide a copy of the vaccination record if obtained from local pharmacy or Health Dept. Verbalized acceptance and understanding.  Pneumococcal vaccine status: Declined,  Education has been provided regarding the importance of this vaccine but patient still declined. Advised may receive this vaccine at local pharmacy or Health Dept. Aware to provide a copy of the vaccination record if obtained from local pharmacy or Health Dept. Verbalized acceptance and understanding.   Covid-19 vaccine status: Declined, Education has been provided regarding the importance of this vaccine but patient still declined. Advised may receive this vaccine at local pharmacy or Health Dept.or vaccine clinic. Aware to provide a copy of the vaccination record if obtained from local pharmacy or Health Dept. Verbalized acceptance and understanding.  Qualifies for Shingles Vaccine? Yes   Zostavax completed No   Shingrix Completed?: No.    Education has been provided regarding the importance of this vaccine. Patient has been advised to call insurance company to determine out of pocket expense if they have not yet received this vaccine. Advised may also receive vaccine at local pharmacy or Health Dept. Verbalized acceptance and understanding.  Screening  Tests Health Maintenance  Topic Date Due   DTaP/Tdap/Td (1 - Tdap) Never done   MAMMOGRAM  02/17/2023 (Originally 05/25/2004)   DEXA SCAN  02/17/2023 (Originally 05/26/2019)   Hepatitis C Screening  02/17/2023 (Originally [birth year]/1972)   COVID-19 Vaccine (3 - Pfizer risk series) 02/22/2023 (Originally 01/15/2020)   Zoster Vaccines- Shingrix (1 of 2) 02/28/2023 (Originally 05/25/1973)   Lung Cancer Screening  06/09/2023 (Originally 05/25/2004)   Pneumonia Vaccine 69+ Years old (1 of 2 - PCV) 06/09/2023 (Originally [birth year]25[birth year]1960)   INFLUENZA VACCINE  02/09/2023   Medicare Annual Wellness (AWV)  01/24/2024   Colonoscopy  11/21/2029   HPV VACCINES  Aged Out    Health Maintenance  Health Maintenance Due  Topic Date Due   DTaP/Tdap/Td (1 - Tdap) Never done    Colorectal cancer screening: Type of screening: Colonoscopy. Completed 11/22/19. Repeat every 10 years  Mammogram status: No longer required due to per pt .    Lung Cancer Screening: (Low Dose CT Chest recommended if Age 80-80  years, 20 pack-year currently smoking OR have quit w/in 15years.) does qualify.   Lung Cancer Screening Referral: declined   Additional Screening:  Hepatitis C Screening: does qualify  Vision Screening: Recommended annual ophthalmology exams for early detection of glaucoma and other disorders of the eye. Is the patient up to date with their annual eye exam?  Yes  Who is the provider or what is the name of the office in which the patient attends annual eye exams? Dr Yetta Barre  If pt is not established with a provider, would they like to be referred to a provider to establish care? No .   Dental Screening: Recommended annual dental exams for proper oral hygiene   Community Resource Referral / Chronic Care Management: CRR required this visit?  No   CCM required this visit?  No     Plan:     I have personally reviewed and noted the following in the patient's chart:   Medical and social history Use of  alcohol, tobacco or illicit drugs  Current medications and supplements including opioid prescriptions. Patient is not currently taking opioid prescriptions. Functional ability and status Nutritional status Physical activity Advanced directives List of other physicians Hospitalizations, surgeries, and ER visits in previous 12 months Vitals Screenings to include cognitive, depression, and falls Referrals and appointments  In addition, I have reviewed and discussed with patient certain preventive protocols, quality metrics, and best practice recommendations. A written personalized care plan for preventive services as well as general preventive health recommendations were provided to patient.     Marzella Schlein, LPN   [id number]   After Visit Summary: (MyChart) Due to this being a telephonic visit, the after visit summary with patients personalized plan was offered to patient via MyChart   Nurse Notes: none

## 2023-01-24 NOTE — Patient Instructions (Signed)
Tracey Saunders , Thank you for taking time to come for your Medicare Wellness Visit. I appreciate your ongoing commitment to your health goals. Please review the following plan we discussed and let me know if I can assist you in the future.   These are the goals we discussed:  Goals      Patient Stated     Be able to increase walking      Patient Stated     Lose weight         This is a list of the screening recommended for you and due dates:  Health Maintenance  Topic Date Due   DTaP/Tdap/Td vaccine (1 - Tdap) Never done   Mammogram  02/17/2023*   DEXA scan (bone density measurement)  02/17/2023*   Hepatitis C Screening  02/17/2023*   COVID-19 Vaccine (3 - Pfizer risk series) 02/22/2023*   Zoster (Shingles) Vaccine (1 of 2) 02/28/2023*   Screening for Lung Cancer  06/09/2023*   Pneumonia Vaccine (1 of 2 - PCV) 06/09/2023*   Flu Shot  02/09/2023   Medicare Annual Wellness Visit  01/24/2024   Colon Cancer Screening  11/21/2029   HPV Vaccine  Aged Out  *Topic was postponed. The date shown is not the original due date.    Advanced directives: Advance directive discussed with you today. Even though you declined this today please call our office should you change your mind and we can give you the proper paperwork for you to fill out.   Conditions/risks identified: lose weight   Next appointment: Follow up in one year for your annual wellness visit    Preventive Care 65 Years and Older, Female Preventive care refers to lifestyle choices and visits with your health care provider that can promote health and wellness. What does preventive care include? A yearly physical exam. This is also called an annual well check. Dental exams once or twice a year. Routine eye exams. Ask your health care provider how often you should have your eyes checked. Personal lifestyle choices, including: Daily care of your teeth and gums. Regular physical activity. Eating a healthy diet. Avoiding  tobacco and drug use. Limiting alcohol use. Practicing safe sex. Taking low-dose aspirin every day. Taking vitamin and mineral supplements as recommended by your health care provider. What happens during an annual well check? The services and screenings done by your health care provider during your annual well check will depend on your age, overall health, lifestyle risk factors, and family history of disease. Counseling  Your health care provider may ask you questions about your: Alcohol use. Tobacco use. Drug use. Emotional well-being. Home and relationship well-being. Sexual activity. Eating habits. History of falls. Memory and ability to understand (cognition). Work and work Astronomer. Reproductive health. Screening  You may have the following tests or measurements: Height, weight, and BMI. Blood pressure. Lipid and cholesterol levels. These may be checked every 5 years, or more frequently if you are over 41 years old. Skin check. Lung cancer screening. You may have this screening every year starting at age 79 if you have a 30-pack-year history of smoking and currently smoke or have quit within the past 15 years. Fecal occult blood test (FOBT) of the stool. You may have this test every year starting at age 64. Flexible sigmoidoscopy or colonoscopy. You may have a sigmoidoscopy every 5 years or a colonoscopy every 10 years starting at age 80. Hepatitis C blood test. Hepatitis B blood test. Sexually transmitted disease (STD) testing. Diabetes  screening. This is done by checking your blood sugar (glucose) after you have not eaten for a while (fasting). You may have this done every 1-3 years. Bone density scan. This is done to screen for osteoporosis. You may have this done starting at age 47. Mammogram. This may be done every 1-2 years. Talk to your health care provider about how often you should have regular mammograms. Talk with your health care provider about your test  results, treatment options, and if necessary, the need for more tests. Vaccines  Your health care provider may recommend certain vaccines, such as: Influenza vaccine. This is recommended every year. Tetanus, diphtheria, and acellular pertussis (Tdap, Td) vaccine. You may need a Td booster every 10 years. Zoster vaccine. You may need this after age 25. Pneumococcal 13-valent conjugate (PCV13) vaccine. One dose is recommended after age 88. Pneumococcal polysaccharide (PPSV23) vaccine. One dose is recommended after age 85. Talk to your health care provider about which screenings and vaccines you need and how often you need them. This information is not intended to replace advice given to you by your health care provider. Make sure you discuss any questions you have with your health care provider. Document Released: 07/24/2015 Document Revised: 03/16/2016 Document Reviewed: 04/28/2015 Elsevier Interactive Patient Education  2017 [employer].  Fall Prevention in the Home Falls can cause injuries. They can happen to people of all ages. There are many things you can do to make your home safe and to help prevent falls. What can I do on the outside of my home? Regularly fix the edges of walkways and driveways and fix any cracks. Remove anything that might make you trip as you walk through a door, such as a raised step or threshold. Trim any bushes or trees on the path to your home. Use bright outdoor lighting. Clear any walking paths of anything that might make someone trip, such as rocks or tools. Regularly check to see if handrails are loose or broken. Make sure that both sides of any steps have handrails. Any raised decks and porches should have guardrails on the edges. Have any leaves, snow, or ice cleared regularly. Use sand or salt on walking paths during winter. Clean up any spills in your garage right away. This includes oil or grease spills. What can I do in the bathroom? Use night  lights. Install grab bars by the toilet and in the tub and shower. Do not use towel bars as grab bars. Use non-skid mats or decals in the tub or shower. If you need to sit down in the shower, use a plastic, non-slip stool. Keep the floor dry. Clean up any water that spills on the floor as soon as it happens. Remove soap buildup in the tub or shower regularly. Attach bath mats securely with double-sided non-slip rug tape. Do not have throw rugs and other things on the floor that can make you trip. What can I do in the bedroom? Use night lights. Make sure that you have a light by your bed that is easy to reach. Do not use any sheets or blankets that are too big for your bed. They should not hang down onto the floor. Have a firm chair that has side arms. You can use this for support while you get dressed. Do not have throw rugs and other things on the floor that can make you trip. What can I do in the kitchen? Clean up any spills right away. Avoid walking on wet floors. Keep  items that you use a lot in easy-to-reach places. If you need to reach something above you, use a strong step stool that has a grab bar. Keep electrical cords out of the way. Do not use floor polish or wax that makes floors slippery. If you must use wax, use non-skid floor wax. Do not have throw rugs and other things on the floor that can make you trip. What can I do with my stairs? Do not leave any items on the stairs. Make sure that there are handrails on both sides of the stairs and use them. Fix handrails that are broken or loose. Make sure that handrails are as long as the stairways. Check any carpeting to make sure that it is firmly attached to the stairs. Fix any carpet that is loose or worn. Avoid having throw rugs at the top or bottom of the stairs. If you do have throw rugs, attach them to the floor with carpet tape. Make sure that you have a light switch at the top of the stairs and the bottom of the stairs. If  you do not have them, ask someone to add them for you. What else can I do to help prevent falls? Wear shoes that: Do not have high heels. Have rubber bottoms. Are comfortable and fit you well. Are closed at the toe. Do not wear sandals. If you use a stepladder: Make sure that it is fully opened. Do not climb a closed stepladder. Make sure that both sides of the stepladder are locked into place. Ask someone to hold it for you, if possible. Clearly mark and make sure that you can see: Any grab bars or handrails. First and last steps. Where the edge of each step is. Use tools that help you move around (mobility aids) if they are needed. These include: Canes. Walkers. Scooters. Crutches. Turn on the lights when you go into a dark area. Replace any light bulbs as soon as they burn out. Set up your furniture so you have a clear path. Avoid moving your furniture around. If any of your floors are uneven, fix them. If there are any pets around you, be aware of where they are. Review your medicines with your doctor. Some medicines can make you feel dizzy. This can increase your chance of falling. Ask your doctor what other things that you can do to help prevent falls. This information is not intended to replace advice given to you by your health care provider. Make sure you discuss any questions you have with your health care provider. Document Released: 04/23/2009 Document Revised: 12/03/2015 Document Reviewed: 08/01/2014 Elsevier Interactive Patient Education  2017 [employer].

## 2023-02-09 ENCOUNTER — Other Ambulatory Visit: Payer: Self-pay | Admitting: Family Medicine

## 2023-02-13 ENCOUNTER — Ambulatory Visit (HOSPITAL_BASED_OUTPATIENT_CLINIC_OR_DEPARTMENT_OTHER): Payer: Medicare HMO | Admitting: Orthopaedic Surgery

## 2023-02-16 ENCOUNTER — Ambulatory Visit (INDEPENDENT_AMBULATORY_CARE_PROVIDER_SITE_OTHER): Payer: Medicare HMO

## 2023-02-16 ENCOUNTER — Ambulatory Visit (INDEPENDENT_AMBULATORY_CARE_PROVIDER_SITE_OTHER): Payer: Medicare HMO | Admitting: Orthopaedic Surgery

## 2023-02-16 DIAGNOSIS — M542 Cervicalgia: Secondary | ICD-10-CM

## 2023-02-16 DIAGNOSIS — M47812 Spondylosis without myelopathy or radiculopathy, cervical region: Secondary | ICD-10-CM | POA: Diagnosis not present

## 2023-02-16 MED ORDER — METHYLPREDNISOLONE 4 MG PO TBPK: ORAL_TABLET | ORAL | 0 refills | Status: DC

## 2023-02-16 MED ORDER — METHOCARBAMOL 500 MG PO TABS: 500.0000 mg | ORAL_TABLET | Freq: Four times a day (QID) | ORAL | 3 refills | Status: DC

## 2023-02-16 NOTE — Progress Notes (Signed)
Post Operative Evaluation     Interval History:   Presents today with radiation and numbness down the right arm.  She states that bilateral hands have been going weak for the last several months.  She is experiencing weakness in the right hand with lifting.  She is also experiencing significant neck pain.  She has previously tried physical therapy in the right shoulder and neck without any relief.   PMH/PSH/Family History/Social History/Meds/Allergies:    Past Medical History:  Diagnosis Date   Anxiety disorder 09/09/2019   B12 deficiency    Cancer (HCC)    cervical cancer   Depression    Diverticulitis    Fatty liver    GERD (gastroesophageal reflux disease)    Hyperlipidemia    Hypertension    Insomnia 09/09/2019   Liver cyst    Vitamin D deficiency    Past Surgical History:  Procedure Laterality Date   ABDOMINAL HYSTERECTOMY     COLON RESECTION     due to diverticulitis   SHOULDER ARTHROSCOPY WITH ROTATOR CUFF REPAIR Right 11/15/2022   Procedure: RIGHT SHOULDER ARTHROSCOPY WITH BICEPS TENODESIS;  Surgeon: Huel Cote, MD;  Location: [facility];  Service: Orthopedics;  Laterality: Right;   Social History   Socioeconomic History   Marital status: Single    Spouse name: Not on file   Number of children: 1   Years of education: Not on file   Highest education level: Not on file  Occupational History   Occupation: retired   Occupation: retired  Tobacco Use   Smoking status: Light Smoker    Current packs/day: 0.50    Types: Cigarettes   Smokeless tobacco: Never  Vaping Use   Vaping status: Never Used  Substance and Sexual Activity   Alcohol use: Not Currently    Comment: occasional beer or bloody mary once a week   Drug use: Never   Sexual activity: Not on file  Other Topics Concern   Not on file  Social History Narrative   Not on file   Social Determinants of Health   Financial Resource Strain: Low  Risk  (01/24/2023)   Overall Financial Resource Strain (CARDIA)    Difficulty of Paying Living Expenses: Not hard at all  Food Insecurity: No Food Insecurity (01/24/2023)   Hunger Vital Sign    Worried About Running Out of Food in the Last Year: Never true    Ran Out of Food in the Last Year: Never true  Transportation Needs: No Transportation Needs (01/24/2023)   PRAPARE - Administrator, Civil Service (Medical): No    Lack of Transportation (Non-Medical): No  Physical Activity: Sufficiently Active (01/24/2023)   Exercise Vital Sign    Days of Exercise per Week: 5 days    Minutes of Exercise per Session: 30 min  Stress: No Stress Concern Present (01/24/2023)   [employer] of Occupational Health - Occupational Stress Questionnaire    Feeling of Stress : Not at all  Social Connections: Socially Isolated (01/24/2023)   Social Connection and Isolation Panel [NHANES]    Frequency of Communication with Friends and Family: More than three times a week    Frequency of Social Gatherings with Friends and Family: Once a week    Attends Religious Services: Never    Production manager of  Clubs or Organizations: No    Attends Banker Meetings: Never    Marital Status: Never married   Family History  Problem Relation Age of Onset   Colon cancer Neg Hx    Esophageal cancer Neg Hx    Stomach cancer Neg Hx    Rectal cancer Neg Hx    Allergies  Allergen Reactions   Lisinopril Swelling   Losartan Swelling   Pseudoephedrine-Naproxen Na Er     Other Reaction(s): Other (See Comments)   Current Outpatient Medications  Medication Sig Dispense Refill   methocarbamol (ROBAXIN) 500 MG tablet Take 1 tablet (500 mg total) by mouth 4 (four) times daily. 30 tablet 3   methylPREDNISolone (MEDROL DOSEPAK) 4 MG TBPK tablet Take per packet instructions 1 each 0   amLODipine (NORVASC) 5 MG tablet Take 1 tablet (5 mg total) by mouth daily. 90 tablet 1   Artificial Saliva (BIOTENE DRY  MOUTH MOISTURIZING) SOLN USe daily as needed for dry mouth 443 mL 0   aspirin EC 325 MG tablet Take 1 tablet (325 mg total) by mouth daily. 14 tablet 0   atorvastatin (LIPITOR) 20 MG tablet TAKE 1 TABLET DAILY 90 tablet 1   atorvastatin (LIPITOR) 40 MG tablet Take 1 tablet (40 mg total) by mouth daily. 90 tablet 1   azelastine (ASTELIN) 0.1 % nasal spray Place 2 sprays into both nostrils 2 (two) times daily. Use in each nostril as directed 30 mL 5   buPROPion (WELLBUTRIN XL) 150 MG 24 hr tablet TAKE 1 TABLET DAILY 90 tablet 1   diclofenac Sodium (VOLTAREN) 1 % GEL Apply 4 g topically 4 (four) times daily as needed. 100 g 3   esomeprazole (NEXIUM) 40 MG capsule Take 1 capsule (40 mg total) by mouth daily. 90 capsule 1   fluticasone (FLONASE) 50 MCG/ACT nasal spray Place into both nostrils.     furosemide (LASIX) 20 MG tablet TAKE 1 TABLET TWICE A DAY 180 tablet 3   gabapentin (NEURONTIN) 300 MG capsule TAKE 2 CAPSULES(600MG       TOTAL) 3 TIMES A DAY 540 capsule 1   Hot/Cold Therapy Aids (HOT/COLD PACK) PADS Use as directed 3 each 0   Humidifiers MISC Use as needed to prevent dry mouth 1 each 0   levocetirizine (XYZAL) 5 MG tablet Take 5 mg by mouth 2 (two) times daily as needed.     pilocarpine (SALAGEN) 5 MG tablet Take 1 tablet (5 mg total) by mouth 2 (two) times daily. 60 tablet 1   potassium chloride SA (KLOR-CON M) 20 MEQ tablet Take 1 tablet (20 mEq total) by mouth 2 (two) times daily. 180 tablet 1   sertraline (ZOLOFT) 50 MG tablet TAKE 1 AND 1/2 TABLETS AT  BEDTIME 135 tablet 3   tizanidine (ZANAFLEX) 2 MG capsule Take 1 capsule (2 mg total) by mouth 3 (three) times daily. 30 capsule 1   traZODone (DESYREL) 100 MG tablet Take 1 tablet (100 mg total) by mouth at bedtime as needed for sleep. Please note, did larger dose so only 1 tab 90 tablet 3   No current facility-administered medications for this visit.   No results found.  Review of Systems:   A ROS was performed including  pertinent positives and negatives as documented in the HPI.   Musculoskeletal Exam:    There were no vitals taken for this visit.  Incisions are well-appearing without erythema or drainage.  Forward elevation is to 170 degrees equal to the contralateral side.  No tenderness over the biceps tendon.  There is some bruising about shoulder  Positive Spurling maneuver on the right with radiation down the right arm.  Otherwise intact strength right upper extremity.  Aside from some mild triceps weakness  Imaging:      I personally reviewed and interpreted the radiographs.   Assessment:   With evidence of right shoulder cervical radiculopathy.  I did describe that given the fact that she has trialed and failed physical therapy in the past I would recommend an MRI of her cervical spine so that we could rule out any type of radiculopathy.  I have also ordered Robaxin as well as a Medrol Dosepak to help her with pain  Plan :    -Return to clinic following MRI cervical spine      I personally saw and evaluated the patient, and participated in the management and treatment plan.  Huel Cote, MD Attending Physician, Orthopedic Surgery  This document was dictated using Dragon voice recognition software. A reasonable attempt at proof reading has been made to minimize errors.

## 2023-02-20 ENCOUNTER — Encounter: Payer: Self-pay | Admitting: Family Medicine

## 2023-02-20 ENCOUNTER — Ambulatory Visit (INDEPENDENT_AMBULATORY_CARE_PROVIDER_SITE_OTHER): Payer: Medicare HMO | Admitting: Family Medicine

## 2023-02-20 VITALS — BP 110/68 | HR 78 | Temp 97.9°F | Ht 61.0 in | Wt 135.2 lb

## 2023-02-20 DIAGNOSIS — G4440 Drug-induced headache, not elsewhere classified, not intractable: Secondary | ICD-10-CM | POA: Diagnosis not present

## 2023-02-20 DIAGNOSIS — E7800 Pure hypercholesterolemia, unspecified: Secondary | ICD-10-CM

## 2023-02-20 DIAGNOSIS — E538 Deficiency of other specified B group vitamins: Secondary | ICD-10-CM

## 2023-02-20 DIAGNOSIS — R490 Dysphonia: Secondary | ICD-10-CM | POA: Diagnosis not present

## 2023-02-20 DIAGNOSIS — I10 Essential (primary) hypertension: Secondary | ICD-10-CM

## 2023-02-20 MED ORDER — CYANOCOBALAMIN 1000 MCG/ML IJ SOLN
1000.0000 ug | Freq: Once | INTRAMUSCULAR | Status: AC
Start: 2023-02-20 — End: 2023-02-20
  Administered 2023-02-20: 1000 ug via INTRAMUSCULAR

## 2023-02-20 MED ORDER — KETOROLAC TROMETHAMINE 60 MG/2ML IM SOLN
60.0000 mg | Freq: Once | INTRAMUSCULAR | Status: AC
Start: 2023-02-20 — End: 2023-02-20
  Administered 2023-02-20: 60 mg via INTRAMUSCULAR

## 2023-02-20 NOTE — Assessment & Plan Note (Signed)
Chronic. B12 injection given today

## 2023-02-20 NOTE — Patient Instructions (Addendum)
It was very nice to see you today! Referral sent to [facility]  Get B12 vitamins.  Or melting/dissolving/liquid B12   PLEASE NOTE:  If you had any lab tests please let us know if you have not heard back within a few days. You may see your results on MyChart before we have a chance to review them but we will give you a call once they are reviewed by [place]. If we ordered any referrals today, please let us know if you have not heard from their office within the next week.   Please try these tips to maintain a healthy lifestyle:  Eat most of your calories during the day when you are active. Eliminate processed foods including packaged sweets (pies, cakes, cookies), reduce intake of potatoes, white bread, white pasta, and white rice. Look for whole grain options, oat flour or almond flour.  Each meal should contain half fruits/vegetables, one quarter protein, and one quarter carbs (no bigger than a computer mouse).  Cut down on sweet beverages. This includes juice, soda, and sweet tea. Also watch fruit intake, though this is a healthier sweet option, it still contains natural sugar! Limit to 3 servings daily.  Drink at least 1 glass of water with each meal and aim for at least 8 glasses per day  Exercise at least 150 minutes every week.

## 2023-02-20 NOTE — Assessment & Plan Note (Signed)
Chronic.  Controlled.  Continue atorvastatin 40 mg daily 

## 2023-02-20 NOTE — Progress Notes (Signed)
Subjective:     Patient ID: Tracey Saunders, female    DOB: 1955-07-21, 67 y.o.   MRN: [id number]  Chief Complaint  Patient presents with   Hypertension   B12 Injection   Pain Management    R neck   Hoarse    HPI  HTN - Pt is on amlodipine 5 mg. No new complaints.  No dizziness/cp/palp/edema/cough/sob  HLD - Taking Atorvastatin 40 mg, managing well.   Neck pain - She complains of neck pain that occasionally radiates to her right shoulder. She attributes her shoulder flare ups to her neck pain since her the shoulder surgery had already resolved her right shoulder pain. She has a follow up with Dr. Steward Drone from ortho on 9/6. She reports she will soon be having a cervical MRI done.  Wants shot of toradol(getting HA as well)  Sore throat - She reports occasional sore throat and intermittent hoarse voice. Has occasional difficulty swallowing. She states she has to spit out phlegm, sometimes has to pull out of her mouth due to large amounts-"like spaghetti". Has followed up with ENT. Drinks 2 cups of coffee a day. Is still smoking, but notes she smokes half as much as she used to. Has a couple drinks on the weekends, hasn't drank in about a month. Prev ENT was not helpful. Denies any chills, but notes she gets cold easily when in the [facility]. No fever or rhinorrhea.  5.  Vitamin B12 deficiency-on injjections.  Willing to try otc.  Limited finances  Health Maintenance Due  Topic Date Due   Hepatitis C Screening  Never done   DTaP/Tdap/Td (1 - Tdap) Never done   INFLUENZA VACCINE  02/09/2023    Past Medical History:  Diagnosis Date   Anxiety disorder 09/09/2019   B12 deficiency    Cancer (HCC)    cervical cancer   Depression    Diverticulitis    Fatty liver    GERD (gastroesophageal reflux disease)    Hyperlipidemia    Hypertension    Insomnia 09/09/2019   Liver cyst    Vitamin D deficiency     Past Surgical History:  Procedure Laterality Date   ABDOMINAL HYSTERECTOMY     COLON  RESECTION     due to diverticulitis   SHOULDER ARTHROSCOPY WITH ROTATOR CUFF REPAIR Right 11/15/2022   Procedure: RIGHT SHOULDER ARTHROSCOPY WITH BICEPS TENODESIS;  Surgeon: Huel Cote, MD;  Location: [facility];  Service: Orthopedics;  Laterality: Right;     Current Outpatient Medications:    amLODipine (NORVASC) 5 MG tablet, Take 1 tablet (5 mg total) by mouth daily., Disp: 90 tablet, Rfl: 1   Artificial Saliva (BIOTENE DRY MOUTH MOISTURIZING) SOLN, USe daily as needed for dry mouth, Disp: 443 mL, Rfl: 0   aspirin EC 325 MG tablet, Take 1 tablet (325 mg total) by mouth daily., Disp: 14 tablet, Rfl: 0   atorvastatin (LIPITOR) 40 MG tablet, Take 1 tablet (40 mg total) by mouth daily., Disp: 90 tablet, Rfl: 1   azelastine (ASTELIN) 0.1 % nasal spray, Place 2 sprays into both nostrils 2 (two) times daily. Use in each nostril as directed, Disp: 30 mL, Rfl: 5   buPROPion (WELLBUTRIN XL) 150 MG 24 hr tablet, TAKE 1 TABLET DAILY, Disp: 90 tablet, Rfl: 1   diclofenac Sodium (VOLTAREN) 1 % GEL, Apply 4 g topically 4 (four) times daily as needed., Disp: 100 g, Rfl: 3   esomeprazole (NEXIUM) 40 MG capsule, Take 1 capsule (40 mg  total) by mouth daily., Disp: 90 capsule, Rfl: 1   fluticasone (FLONASE) 50 MCG/ACT nasal spray, Place into both nostrils., Disp: , Rfl:    furosemide (LASIX) 20 MG tablet, TAKE 1 TABLET TWICE A DAY, Disp: 180 tablet, Rfl: 3   gabapentin (NEURONTIN) 300 MG capsule, TAKE 2 CAPSULES(600MG       TOTAL) 3 TIMES A DAY, Disp: 540 capsule, Rfl: 1   Hot/Cold Therapy Aids (HOT/COLD PACK) PADS, Use as directed, Disp: 3 each, Rfl: 0   Humidifiers MISC, Use as needed to prevent dry mouth, Disp: 1 each, Rfl: 0   levocetirizine (XYZAL) 5 MG tablet, Take 5 mg by mouth 2 (two) times daily as needed., Disp: , Rfl:    methocarbamol (ROBAXIN) 500 MG tablet, Take 1 tablet (500 mg total) by mouth 4 (four) times daily., Disp: 30 tablet, Rfl: 3   methylPREDNISolone (MEDROL DOSEPAK) 4  MG TBPK tablet, Take per packet instructions, Disp: 1 each, Rfl: 0   pilocarpine (SALAGEN) 5 MG tablet, Take 1 tablet (5 mg total) by mouth 2 (two) times daily., Disp: 60 tablet, Rfl: 1   potassium chloride SA (KLOR-CON M) 20 MEQ tablet, Take 1 tablet (20 mEq total) by mouth 2 (two) times daily., Disp: 180 tablet, Rfl: 1   sertraline (ZOLOFT) 50 MG tablet, TAKE 1 AND 1/2 TABLETS AT  BEDTIME, Disp: 135 tablet, Rfl: 3   tizanidine (ZANAFLEX) 2 MG capsule, Take 1 capsule (2 mg total) by mouth 3 (three) times daily., Disp: 30 capsule, Rfl: 1   traZODone (DESYREL) 100 MG tablet, Take 1 tablet (100 mg total) by mouth at bedtime as needed for sleep. Please note, did larger dose so only 1 tab, Disp: 90 tablet, Rfl: 3  Allergies  Allergen Reactions   Lisinopril Swelling   Losartan Swelling   Pseudoephedrine-Naproxen Na Er     Other Reaction(s): Other (See Comments)   ROS neg/noncontributory except as noted HPI/below      Objective:     BP 110/68 (BP Location: Left Arm, Patient Position: Sitting, Cuff Size: Normal)   Pulse 78   Temp 97.9 F (36.6 C) (Temporal)   Ht 5\' 1"  (1.549 m)   Wt 135 lb 3.2 oz (61.3 kg)   SpO2 98%   BMI 25.55 kg/m  Wt Readings from Last 3 Encounters:  02/20/23 135 lb 3.2 oz (61.3 kg)  01/24/23 133 lb (60.3 kg)  12/20/22 133 lb 2 oz (60.4 kg)    Physical Exam   Gen: WDWN NAD HEENT: NCAT, conjunctiva not injected, sclera nonicteric. TM WNL B, OP moist, no exudates. +Wax in right ear. NECK:  supple, no thyromegaly, no nodes, no carotid bruits. +Tenderness on right neck muscles.  CARDIAC: RRR, S1S2+, no murmur. DP 2+B LUNGS: CTAB. No wheezes ABDOMEN:  BS+, soft, NTND, No HSM, no masses EXT:  no edema MSK: no gross abnormalities.  NEURO: A&O x3.  CN II-XII intact.  PSYCH: normal mood. Good eye contact     Assessment & Plan:  Essential hypertension Assessment & Plan: Chronic.  Controlled.  Continue amlodipine 5 mg, Lasix 20 mg twice daily   Pure  hypercholesterolemia Assessment & Plan: Chronic.  Controlled.  Continue atorvastatin 40 mg daily   Vitamin B12 deficiency Assessment & Plan: Chronic. B12 injection given today   Orders: -     Cyanocobalamin  Hoarseness -     Ambulatory referral to ENT  Headache, drug induced -     Ketorolac Tromethamine  -4.  Hoarseness-intermittent.  Patient is already  on a PPI.  Not sure if due to reflux, polyp, other.  She is a smoker.  Will refer to ENT. 5.  Headache/cervical radiculopathy-chronic.  Exacerbated.  Will be getting an MRI of her neck per Ortho.  Toradol 60 mg IM given in the office.  Return in about 2 months (around 04/22/2023) for HTN, cholesterol,B12.   I,Rachel Rivera,acting as a scribe for Angelena Sole, MD.,have documented all relevant documentation on the behalf of Angelena Sole, MD,as directed by  Angelena Sole, MD while in the presence of Angelena Sole, MD.  I, Angelena Sole, MD, have reviewed all documentation for this visit. The documentation on 02/20/23 for the exam, diagnosis, procedures, and orders are all accurate and complete.   Angelena Sole, MD

## 2023-02-20 NOTE — Assessment & Plan Note (Signed)
Chronic.  Controlled.  Continue amlodipine 5 mg, Lasix 20 mg twice daily

## 2023-03-17 ENCOUNTER — Telehealth (HOSPITAL_BASED_OUTPATIENT_CLINIC_OR_DEPARTMENT_OTHER): Payer: Self-pay | Admitting: Orthopaedic Surgery

## 2023-03-17 ENCOUNTER — Ambulatory Visit
Admission: RE | Admit: 2023-03-17 | Discharge: 2023-03-17 | Disposition: A | Discharge status: Home / Self Care | Payer: Medicare HMO | Source: Ambulatory Visit | Attending: Orthopaedic Surgery | Admitting: Orthopaedic Surgery

## 2023-03-17 ENCOUNTER — Ambulatory Visit (HOSPITAL_BASED_OUTPATIENT_CLINIC_OR_DEPARTMENT_OTHER): Payer: Medicare HMO | Admitting: Orthopaedic Surgery

## 2023-03-17 DIAGNOSIS — M4802 Spinal stenosis, cervical region: Secondary | ICD-10-CM | POA: Diagnosis not present

## 2023-03-17 DIAGNOSIS — M542 Cervicalgia: Secondary | ICD-10-CM | POA: Diagnosis not present

## 2023-03-17 DIAGNOSIS — M47812 Spondylosis without myelopathy or radiculopathy, cervical region: Secondary | ICD-10-CM | POA: Diagnosis not present

## 2023-03-17 DIAGNOSIS — G8929 Other chronic pain: Secondary | ICD-10-CM | POA: Diagnosis not present

## 2023-03-17 NOTE — Telephone Encounter (Signed)
Called patient to see if she need to move her appointment until later to today or a different day. I left a message on her voicemail

## 2023-03-20 ENCOUNTER — Telehealth: Payer: Self-pay | Admitting: Family Medicine

## 2023-03-20 NOTE — Telephone Encounter (Signed)
Spoke to patient and she stated that she need a note saying she has stress, anxiety and depression so that her dog can be an ESA. [employer] need letter in order for her to transport her dog. Please advise.

## 2023-03-20 NOTE — Telephone Encounter (Signed)
Pt would like a call back concerning some results.

## 2023-03-22 NOTE — Telephone Encounter (Signed)
Patient stated that her dog is an ESA and she used to be able to transport him to get groomed with transportation. Transportation is now trying to deny her from using them to transport the dog. She stated now they are telling her she has to have a note from her doctor stating she has anxiety, depression and that the dog is needed. She stated that she can't always pay for a cab to take the dog to get groomed. Patient also wanted to let pcp know her tremors are getting worse in the mornings when she wake up.

## 2023-03-23 ENCOUNTER — Encounter: Payer: Self-pay | Admitting: *Deleted

## 2023-03-23 NOTE — Telephone Encounter (Signed)
Patient notified of message below.

## 2023-03-29 ENCOUNTER — Ambulatory Visit (INDEPENDENT_AMBULATORY_CARE_PROVIDER_SITE_OTHER): Payer: Medicare HMO | Admitting: Orthopaedic Surgery

## 2023-03-29 DIAGNOSIS — M542 Cervicalgia: Secondary | ICD-10-CM | POA: Diagnosis not present

## 2023-03-29 NOTE — Progress Notes (Signed)
Post Operative Evaluation     Interval History:   Presents today with radiation and numbness down the right arm.  At this time she is here for MRI review if there is   PMH/PSH/Family History/Social History/Meds/Allergies:    Past Medical History:  Diagnosis Date   Anxiety disorder 09/09/2019   B12 deficiency    Cancer (HCC)    cervical cancer   Depression    Diverticulitis    Fatty liver    GERD (gastroesophageal reflux disease)    Hyperlipidemia    Hypertension    Insomnia 09/09/2019   Liver cyst    Vitamin D deficiency    Past Surgical History:  Procedure Laterality Date   ABDOMINAL HYSTERECTOMY     COLON RESECTION     due to diverticulitis   SHOULDER ARTHROSCOPY WITH ROTATOR CUFF REPAIR Right 11/15/2022   Procedure: RIGHT SHOULDER ARTHROSCOPY WITH BICEPS TENODESIS;  Surgeon: Huel Cote, MD;  Location: [facility];  Service: Orthopedics;  Laterality: Right;   Social History   Socioeconomic History   Marital status: Single    Spouse name: Not on file   Number of children: 1   Years of education: Not on file   Highest education level: Not on file  Occupational History   Occupation: retired   Occupation: retired  Tobacco Use   Smoking status: Light Smoker    Current packs/day: 0.50    Types: Cigarettes   Smokeless tobacco: Never  Vaping Use   Vaping status: Never Used  Substance and Sexual Activity   Alcohol use: Not Currently    Comment: occasional beer or bloody mary once a week   Drug use: Never   Sexual activity: Not on file  Other Topics Concern   Not on file  Social History Narrative   Not on file   Social Determinants of Health   Financial Resource Strain: Low Risk  (01/24/2023)   Overall Financial Resource Strain (CARDIA)    Difficulty of Paying Living Expenses: Not hard at all  Food Insecurity: No Food Insecurity (01/24/2023)   Hunger Vital Sign    Worried About Running Out of Food in the  Last Year: Never true    Ran Out of Food in the Last Year: Never true  Transportation Needs: No Transportation Needs (01/24/2023)   PRAPARE - Administrator, Civil Service (Medical): No    Lack of Transportation (Non-Medical): No  Physical Activity: Sufficiently Active (01/24/2023)   Exercise Vital Sign    Days of Exercise per Week: 5 days    Minutes of Exercise per Session: 30 min  Stress: No Stress Concern Present (01/24/2023)   [employer] of Occupational Health - Occupational Stress Questionnaire    Feeling of Stress : Not at all  Social Connections: Socially Isolated (01/24/2023)   Social Connection and Isolation Panel [NHANES]    Frequency of Communication with Friends and Family: More than three times a week    Frequency of Social Gatherings with Friends and Family: Once a week    Attends Religious Services: Never    Database administrator or Organizations: No    Attends Banker Meetings: Never    Marital Status: Never married   Family History  Problem Relation Age of Onset   Colon cancer Neg Hx  Esophageal cancer Neg Hx    Stomach cancer Neg Hx    Rectal cancer Neg Hx    Allergies  Allergen Reactions   Lisinopril Swelling   Losartan Swelling   Pseudoephedrine-Naproxen Na Er     Other Reaction(s): Other (See Comments)   Current Outpatient Medications  Medication Sig Dispense Refill   amLODipine (NORVASC) 5 MG tablet Take 1 tablet (5 mg total) by mouth daily. 90 tablet 1   Artificial Saliva (BIOTENE DRY MOUTH MOISTURIZING) SOLN USe daily as needed for dry mouth 443 mL 0   aspirin EC 325 MG tablet Take 1 tablet (325 mg total) by mouth daily. 14 tablet 0   atorvastatin (LIPITOR) 40 MG tablet Take 1 tablet (40 mg total) by mouth daily. 90 tablet 1   azelastine (ASTELIN) 0.1 % nasal spray Place 2 sprays into both nostrils 2 (two) times daily. Use in each nostril as directed 30 mL 5   buPROPion (WELLBUTRIN XL) 150 MG 24 hr tablet TAKE 1 TABLET  DAILY 90 tablet 1   diclofenac Sodium (VOLTAREN) 1 % GEL Apply 4 g topically 4 (four) times daily as needed. 100 g 3   esomeprazole (NEXIUM) 40 MG capsule Take 1 capsule (40 mg total) by mouth daily. 90 capsule 1   fluticasone (FLONASE) 50 MCG/ACT nasal spray Place into both nostrils.     furosemide (LASIX) 20 MG tablet TAKE 1 TABLET TWICE A DAY 180 tablet 3   gabapentin (NEURONTIN) 300 MG capsule TAKE 2 CAPSULES(600MG       TOTAL) 3 TIMES A DAY 540 capsule 1   Hot/Cold Therapy Aids (HOT/COLD PACK) PADS Use as directed 3 each 0   Humidifiers MISC Use as needed to prevent dry mouth 1 each 0   levocetirizine (XYZAL) 5 MG tablet Take 5 mg by mouth 2 (two) times daily as needed.     methocarbamol (ROBAXIN) 500 MG tablet Take 1 tablet (500 mg total) by mouth 4 (four) times daily. 30 tablet 3   methylPREDNISolone (MEDROL DOSEPAK) 4 MG TBPK tablet Take per packet instructions 1 each 0   pilocarpine (SALAGEN) 5 MG tablet Take 1 tablet (5 mg total) by mouth 2 (two) times daily. 60 tablet 1   potassium chloride SA (KLOR-CON M) 20 MEQ tablet Take 1 tablet (20 mEq total) by mouth 2 (two) times daily. 180 tablet 1   sertraline (ZOLOFT) 50 MG tablet TAKE 1 AND 1/2 TABLETS AT  BEDTIME 135 tablet 3   tizanidine (ZANAFLEX) 2 MG capsule Take 1 capsule (2 mg total) by mouth 3 (three) times daily. 30 capsule 1   traZODone (DESYREL) 100 MG tablet Take 1 tablet (100 mg total) by mouth at bedtime as needed for sleep. Please note, did larger dose so only 1 tab 90 tablet 3   No current facility-administered medications for this visit.   No results found.  Review of Systems:   A ROS was performed including pertinent positives and negatives as documented in the HPI.   Musculoskeletal Exam:    There were no vitals taken for this visit.  Incisions are well-appearing without erythema or drainage.  Forward elevation is to 170 degrees equal to the contralateral side.  No tenderness over the biceps tendon.  There is some  bruising about shoulder  Positive Spurling maneuver on the right with radiation down the right arm.  Otherwise intact strength right upper extremity.  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   Assessment:   With evidence of right shoulder cervical radiculopathy.  I discussed different treatment options with her today.  I did discuss the possibility of discussion with a spine surgeon versus injection.  At this time she is deferring both of these options.  I will plan to see her back as needed Plan :    -Return to clinic as needed      I personally saw and evaluated the patient, and participated in the management and treatment plan.  Huel Cote, MD Attending Physician, Orthopedic Surgery  This document was dictated using Dragon voice recognition software. A reasonable attempt at proof reading has been made to minimize errors.

## 2023-03-30 ENCOUNTER — Other Ambulatory Visit: Payer: Self-pay | Admitting: Orthopaedic Surgery

## 2023-03-30 ENCOUNTER — Telehealth (HOSPITAL_BASED_OUTPATIENT_CLINIC_OR_DEPARTMENT_OTHER): Payer: Self-pay | Admitting: Orthopaedic Surgery

## 2023-03-30 MED ORDER — CELECOXIB 100 MG PO CAPS: 100.0000 mg | ORAL_CAPSULE | Freq: Two times a day (BID) | ORAL | 4 refills | Status: DC

## 2023-03-30 NOTE — Telephone Encounter (Signed)
Patient wants celebrex  sent into the pharmacy [facility] on [address]

## 2023-03-30 NOTE — Telephone Encounter (Signed)
Called and advised.

## 2023-04-06 ENCOUNTER — Ambulatory Visit (INDEPENDENT_AMBULATORY_CARE_PROVIDER_SITE_OTHER): Payer: Medicare HMO | Admitting: Otolaryngology

## 2023-04-06 ENCOUNTER — Encounter (INDEPENDENT_AMBULATORY_CARE_PROVIDER_SITE_OTHER): Payer: Self-pay | Admitting: Otolaryngology

## 2023-04-06 VITALS — BP 114/72 | HR 83 | Ht 61.0 in | Wt 135.0 lb

## 2023-04-06 DIAGNOSIS — M542 Cervicalgia: Secondary | ICD-10-CM

## 2023-04-06 DIAGNOSIS — R490 Dysphonia: Secondary | ICD-10-CM | POA: Diagnosis not present

## 2023-04-06 DIAGNOSIS — R0981 Nasal congestion: Secondary | ICD-10-CM

## 2023-04-06 DIAGNOSIS — K117 Disturbances of salivary secretion: Secondary | ICD-10-CM | POA: Diagnosis not present

## 2023-04-06 DIAGNOSIS — K219 Gastro-esophageal reflux disease without esophagitis: Secondary | ICD-10-CM

## 2023-04-06 DIAGNOSIS — R0982 Postnasal drip: Secondary | ICD-10-CM

## 2023-04-06 DIAGNOSIS — Z720 Tobacco use: Secondary | ICD-10-CM

## 2023-04-06 DIAGNOSIS — R1310 Dysphagia, unspecified: Secondary | ICD-10-CM | POA: Diagnosis not present

## 2023-04-06 DIAGNOSIS — Z9109 Other allergy status, other than to drugs and biological substances: Secondary | ICD-10-CM

## 2023-04-06 MED ORDER — FLUTICASONE PROPIONATE 50 MCG/ACT NA SUSP: 2.0000 | Freq: Every day | NASAL | 6 refills | Status: AC

## 2023-04-06 MED ORDER — DESLORATADINE 5 MG PO TABS: 5.0000 mg | ORAL_TABLET | Freq: Every day | ORAL | 3 refills | Status: DC

## 2023-04-06 NOTE — Progress Notes (Signed)
ENT CONSULT:  Reason for Consult: chronic hoarseness 3-4 months   HPI: Tracey Saunders is an 67 y.o. female with hx of chronic dry mouth, could not afford Pilocarpine when it was prescribed (had it for years), here for 3-4 months of persistent hoarseness. Current smoker, cut down to two cigarettes a day.  Hx of GERD on Nexium. She denies pain with talking or swallowing.  She was previously on Xyzal and Flonase for allergies and post-nasal drainage, not any more.  She tried Biotene for dry mouth and couldn't tolerate the taste.   She had EGD and balloon dilation due to trouble with swallowing, a few months ago, but had no relief of sx. Has trouble swallowing and sensation of food stuck in her throat. She denies dyspnea. She has sinus infections from time to time, and has post-nasal drainage, nasal congestion. She has reduced sense of smell.  She reports a need to blow her nose frequently and at times coughs up clear and brown mucus.   Records Reviewed:  Office note by Dr Steward Drone, Ortho Aug 2024 Presents today with radiation and numbness down the right arm. She states that bilateral hands have been going weak for the last several months. She is experiencing weakness in the right hand with lifting. She is also experiencing significant neck pain. She has previously tried physical therapy in the right shoulder and neck without any relief.   Office note by Dr Ruthine Dose 02/20/23 Hoarseness -     Ambulatory referral to ENT   Headache, drug induced -     Ketorolac Tromethamine   -4.  Hoarseness-intermittent.  Patient is already on a PPI.  Not sure if due to reflux, polyp, other.  She is a smoker.  Will refer to ENT. 5.  Headache/cervical radiculopathy-chronic.  Exacerbated.  Will be getting an MRI of her neck per Ortho.  Toradol 60 mg IM given in the office.   Past Medical History:  Diagnosis Date   Anxiety disorder 09/09/2019   B12 deficiency    Cancer (HCC)    cervical cancer   Depression     Diverticulitis    Fatty liver    GERD (gastroesophageal reflux disease)    Hyperlipidemia    Hypertension    Insomnia 09/09/2019   Liver cyst    Vitamin D deficiency     Past Surgical History:  Procedure Laterality Date   ABDOMINAL HYSTERECTOMY     COLON RESECTION     due to diverticulitis   SHOULDER ARTHROSCOPY WITH ROTATOR CUFF REPAIR Right 11/15/2022   Procedure: RIGHT SHOULDER ARTHROSCOPY WITH BICEPS TENODESIS;  Surgeon: Huel Cote, MD;  Location: [facility];  Service: Orthopedics;  Laterality: Right;    Family History  Problem Relation Age of Onset   Colon cancer Neg Hx    Esophageal cancer Neg Hx    Stomach cancer Neg Hx    Rectal cancer Neg Hx     Social History:  reports that she has been smoking cigarettes. She has never used smokeless tobacco. She reports that she does not currently use alcohol. She reports that she does not use drugs.  Allergies:  Allergies  Allergen Reactions   Lisinopril Swelling   Losartan Swelling   Pseudoephedrine-Naproxen Na Er     Other Reaction(s): Other (See Comments)    Medications: I have reviewed the patient's current medications.  The PMH, PSH, Medications, Allergies, and SH were reviewed and updated.  ROS: Constitutional: Negative for fever, weight loss and weight gain. Cardiovascular:  Negative for chest pain and dyspnea on exertion. Respiratory: Is not experiencing shortness of breath at rest. Gastrointestinal: Negative for nausea and vomiting. Neurological: Negative for headaches. Psychiatric: The patient is not nervous/anxious  Blood pressure 114/72, pulse 83, height 5\' 1"  (1.549 m), weight 135 lb (61.2 kg), SpO2 98%.  PHYSICAL EXAM:  Exam: General: Well-developed, well-nourished Communication and Voice: raspy voice  Respiratory Respiratory effort: Equal inspiration and expiration without stridor Cardiovascular Peripheral Vascular: Warm extremities with equal color/perfusion Eyes: No nystagmus  with equal extraocular motion bilaterally Neuro/Psych/Balance: Patient oriented to person, place, and time; Appropriate mood and affect; Gait is intact with no imbalance; Cranial nerves I-XII are intact Head and Face Inspection: Normocephalic and atraumatic without mass or lesion Palpation: Facial skeleton intact without bony stepoffs Salivary Glands: No mass or tenderness Facial Strength: Facial motility symmetric and full bilaterally ENT Pinna: External ear intact and fully developed External canal: Canal is patent with intact skin Tympanic Membrane: Clear and mobile External Nose: No scar or anatomic deformity Internal Nose: Septum is deviated to the left. No polyp, or purulence. Mucosal edema and erythema present.  Bilateral inferior turbinate hypertrophy.  Lips, Teeth, and gums: Mucosa and teeth intact and viable TMJ: No pain to palpation with full mobility Oral cavity/oropharynx: No erythema or exudate, no lesions present Nasopharynx: No mass or lesion with intact mucosa Hypopharynx: Intact mucosa without pooling of secretions Larynx Glottic: Full true vocal cord mobility without lesion or mass but with evidence of atrophy Supraglottic: Normal appearing epiglottis and AE folds Interarytenoid Space: Moderate pachydermia edema Subglottic Space: Patent without lesion or edema Neck Neck and Trachea: Midline trachea without mass or lesion Thyroid: No mass or nodularity Lymphatics: No lymphadenopathy  Procedure: Summary of Video-Laryngeal-Stroboscopy: Mild vocal fold atrophy and supraglottic compression near complete closure, no lesions, no masses, mucosal wave intact and symmetric, with good amplitude.  Moderate postcricoid edema pachydermia  Preoperative diagnosis: hoarseness  Postoperative diagnosis:   same  Procedure: Flexible fiberoptic laryngoscopy with stroboscopy (09811)  Surgeon: Ashok Croon, MD  Anesthesia: Topical lidocaine and Afrin  Complications:  None  Condition is stable throughout exam  Indications and consent:   The patient presents to the clinic with hoarseness. All the risks, benefits, and potential complications were reviewed with the patient preoperatively and informed verbal consent was obtained.  Procedure: The patient was seated upright in the exam chair.   Topical lidocaine and Afrin were applied to the nasal cavity. After adequate anesthesia had occurred, the flexible telescope was passed into the nasal cavity. The nasopharynx was patent without mass or lesion. The scope was passed behind the soft palate and directed toward the base of tongue. The base of tongue was visualized and was symmetric with no apparent masses or abnormal appearing tissue. There were no signs of a mass or pooling of secretions in the piriform sinuses. The supraglottic structures were normal.  The true vocal cords are mobile. The medial edges were slightly bowed. Closure was incomplete with small spindle-shaped glottic gap. Periodicity present. The mucosal wave and amplitude were normal. There is moderate interarytenoid pachydermia and post cricoid edema. The mucosa appears healthy without lesions.   The laryngoscope was then slowly withdrawn and the patient tolerated the procedure well. There were no complications or blood loss.   Studies Reviewed: MRI c-spine 03/17/23  IMPRESSION: 1. Multilevel cervical spondylosis, most advanced from C3-4 through C5-6. There is resulting mild spinal stenosis and moderate to severe foraminal narrowing bilaterally at C4-5 and C5-6 and on the right  at C3-4. 2. No acute findings or evidence of cord compression.  Assessment/Plan: Encounter Diagnoses  Name Primary?   Chronic hoarseness Yes   Dysphagia, unspecified type    Xerostomia    Cervicalgia    Dysphonia    Environmental allergies    Post-nasal drainage    67 year old female current smoker and has been for several decades but cutting down and currently  only smokes a few cigarettes a day, history of GERD on reflux medications, history of chronic neck pain followed by Ortho, here for evaluation of multiple complaints including chronic hoarseness, xerostomia and dysphagia symptoms with food gets stuck in the back of her throat.  Had EGD with dilation, without symptom relief.  No prior swallow studies. Videostrobe exam with evidence of vocal fold atrophy and incomplete closure as well as supraglottic compression, mucosal wave intact, she also had evidence of postnasal drainage nasal congestion and findings consistent with undertreated GERD LPR.  Chronic hoarseness -no evidence of concerning vocal fold lesions or masses on exam -Management of GERD LPR and postnasal drainage as below -Trial of voice therapy with speech 2.  Longstanding trouble with swallowing -MBS esophagram to evaluate - Ddx oropharyngeal versus esophageal dysphagia  3.  Dry mouth -I discussed strategies to help with dry mouth or mucous membranes for moist on exam today, suspect side effect of her medications -I advised to use lemon cough drops and avoid caffeinated beverages 4.  GERD LPR -Continue Nexium 40 mg daily -Trial of reflux Gourmet -Diet and lifestyle changes to minimize reflux 5.  Nasal congestion and postnasal drainage -I encouraged the patient to resume oral antihistamine and nasal spray - dose frequency below   - start doing nasal rinses NeilMed 6. Tobacco use disorder - I counseled the patient on importance of quitting smoking and benefits to her health if she stopped smoking completely total of 5 minutes spent counseling the patient  - return after therapy and swallow study   Thank you for allowing me to participate in the care of this patient. Please do not hesitate to contact me with any questions or concerns.   Ashok Croon, MD Otolaryngology [facility] Phone: [phone] Fax: [phone]    04/06/2023, 5:50 PM

## 2023-04-06 NOTE — Patient Instructions (Addendum)
-   start using Flonase and take Clarinex for allergies - start doing nasal rinses NeilMed - schedule swallow study  - continue reflux medication - schedule voice therapy  - return after therapy and swallow study   -- Take Reflux Gourmet (natural supplement available on Amazon) to help with symptoms of chronic throat irritation     Lloyd Huger Med Nasal Saline Rinse   - start nasal saline rinses with NeilMed Bottle available over the counter or online to help with nasal congestion

## 2023-04-07 ENCOUNTER — Other Ambulatory Visit (HOSPITAL_COMMUNITY): Payer: Self-pay | Admitting: *Deleted

## 2023-04-07 DIAGNOSIS — R1310 Dysphagia, unspecified: Secondary | ICD-10-CM

## 2023-04-10 ENCOUNTER — Telehealth: Payer: Self-pay | Admitting: Family Medicine

## 2023-04-10 ENCOUNTER — Other Ambulatory Visit: Payer: Self-pay | Admitting: Family Medicine

## 2023-04-10 NOTE — Telephone Encounter (Signed)
Spoke to patient and advised her again that she needed to got to ED for evaluation.

## 2023-04-10 NOTE — Telephone Encounter (Signed)
Rick, Chief Technology Officer, stated final outcome was for pt to see PCP within 4 hours. [name] informed me that he informed patient of the possibility that there may not be any appointments available at this time of day. States that following this, he asked if she would be willing to go to the UC or ED. States patient declined and insisted on seeing PCP.   Patient was added to line and I offered pt an OV with [name] today @ 4:20 pm but pt declined. States she needs a day notice due to using public transportation. Please Advise.    Patient Name First: Tracey Last: Saunders Gender: Female DOB: 05-11-1956 Age: 67 Y 10 M 15 D Return Phone Number: [phone] (Primary) Address: City/ State/ Zip: [place]  [address] Client [facility] - Administrator, [facility] at [facility] Contact Type Call Who Is Calling Patient / Member / Family / Caregiver Call Type Triage / Clinical Relationship To Patient Self Return Phone Number [phone] (Primary) Chief Complaint HEAD INJURY - and not acting right. Change in behaviour after hitting head. Reason for Call Symptomatic / Request for Health Information Initial Comment Caller states the patient fell last week. She was pushing a buggy and the buggy fell and she fell with it. She had a head injury. She had a dent in her head and has been sleeping a lot since then. [name] is her doctor. Transferred from the office. It hurts to wash her hair. Translation No Nurse Assessment Nurse: [name], RN, [name] Date/Time [name] Time): 04/10/2023 3:38:44 PM Confirm and document reason for call. If symptomatic, describe symptoms. ---Caller states she fell last week, it is painful to touch her head, brush or wash hair. She endorsed feeling a dent. injury located near R side of head and top of head. Does the patient have any new or worsening symptoms? ---Yes Will a triage be completed?  ---Yes Related visit to physician within the last 2 weeks? ---No Does the PT have any chronic conditions? (i.e. diabetes, asthma, this includes High risk factors for pregnancy, etc.) ---Yes List chronic conditions. ---hypertension Is this a behavioral health or substance abuse call? ---No Guidelines Guideline Title Affirmed Question Affirmed Notes Nurse Date/Time (Eastern Time) Head Injury [1] Age over 64 years AND [2] swelling or bruise [name] 04/10/2023 3:40:18 PM Disp. Time [name] Time) Disposition Final User 04/10/2023 3:36:24 PM Send to Urgent [name] 04/10/2023 3:43:45 PM See HCP within 4 Hours (or PCP triage) Yes [name], RN, [name] Final Disposition 04/10/2023 3:43:45 PM See HCP within 4 Hours (or PCP triage) Yes [name], RN, [name] Disagree/Comply Disagree Caller Understands Yes PreDisposition Call Doctor Care Advice Given Per Guideline SEE HCP (OR PCP TRIAGE) WITHIN 4 HOURS: * IF OFFICE WILL BE OPEN: You need to be seen within the next 3 or 4 hours. Call your doctor (or NP/PA) now or as soon as the office opens. CALL BACK IF: * You become worse CARE ADVICE given per Head Injury (Adult) guideline. Comments User: [name], RN Date/Time [name] Time): 04/10/2023 3:49:36 PM Caller was specific about being seen in same office with same provider. explained that at end of day, unlikely that office can accommodate. Caller was offered appointment within time frame at different location, different provider. Caller refused, states she needs an appointment at same location with same office due to using public transportation and having to arrange transport. Referrals REFERRED TO PCP OFFICE

## 2023-04-10 NOTE — Telephone Encounter (Signed)
FYI: This call has been transferred to triage nurse: Access Nurse. Once the result note has been entered staff can address the message at that time.  Patient called in with the following symptoms:  Red Word: Fall x 1 week ago, hit head and has been having headaches/sleep often/dent in head    Please advise at Mobile [phone] (mobile)  Message is routed to Provider Pool.

## 2023-04-11 NOTE — Telephone Encounter (Signed)
Patient has been informed again that she need to go to ED for evaluation.

## 2023-04-19 ENCOUNTER — Ambulatory Visit (HOSPITAL_COMMUNITY)
Admission: RE | Admit: 2023-04-19 | Discharge: 2023-04-19 | Disposition: A | Discharge status: Home / Self Care | Payer: Medicare HMO | Source: Ambulatory Visit | Attending: Family Medicine | Admitting: Family Medicine

## 2023-04-19 ENCOUNTER — Ambulatory Visit (HOSPITAL_COMMUNITY)
Admission: RE | Admit: 2023-04-19 | Discharge: 2023-04-19 | Disposition: A | Discharge status: Home / Self Care | Payer: Medicare HMO | Source: Ambulatory Visit | Attending: Otolaryngology

## 2023-04-19 DIAGNOSIS — R1310 Dysphagia, unspecified: Secondary | ICD-10-CM | POA: Diagnosis present

## 2023-04-19 DIAGNOSIS — K219 Gastro-esophageal reflux disease without esophagitis: Secondary | ICD-10-CM | POA: Diagnosis not present

## 2023-04-19 DIAGNOSIS — K224 Dyskinesia of esophagus: Secondary | ICD-10-CM | POA: Insufficient documentation

## 2023-04-19 NOTE — Therapy (Signed)
Modified Barium Swallow Study  Patient Details  Name: Tracey Saunders MRN: [id number] Date of Birth: 09/27/1955  Today's Date: 04/19/2023  Modified Barium Swallow completed.  Full report located under Chart Review in the Imaging Section.  History of Present Illness Tracey Saunders is a 67 y.o. female who presents for this modified barium swallow study per complaints of trouble with swallowing. She reports sensation of food stuck in her throat, frequent nasal congestion, and post-nasal drip. She received an EGD and balloon dilation a few months ago that provided no relief of symptoms. PMH is significant for diverticulitis, GERD, hypertension, and tobacco abuse.   Clinical Impression Tracey Saunders was seen by SLP for modified barium swallow study per her c/o of difficulty with swallowing and globus sensation with solid foods. She presents with a functional oropharyngeal swallow with suspected esophageal impairments, as evidenced by presence of mild barium retrograde and retention of the distal esophagus seen during this study. Tracey Saunders had a pre-scheduled esophagram following this study to further investigate esophageal function. No aspiration or significant residuals were observed during this study. Shallow penetration that did not enter the airway was observed with thin liquid on one occasion. One instance of penetration to the vocal folds that was expelled was observed with nectar. Swallow initiation was delayed to the vallecula. Adequate hyoid excursion and epiglottic inversion were both present.  Mild bolus retrograde was observed with all consistencies, but increased with nectar, honey, and puree. No further ST recommended at this time. Tracey Saunders was advised that she may continue with her regular diet and was given reflux precautions and strategies.  Swallow Evaluation Recommendations Recommendations: PO diet PO Diet Recommendation: Regular;Thin liquids (Level 0) Liquid Administration via:  Straw;Cup Medication Administration: Whole meds with liquid Supervision: Patient able to self-feed Swallowing strategies  : Slow rate;Small bites/sips Postural changes: Stay upright 30-60 min after meals Oral care recommendations: Oral care BID (2x/day)     Marline Backbone, B.S., Speech Therapy Student   04/19/2023,1:13 PM

## 2023-04-24 ENCOUNTER — Ambulatory Visit: Payer: Medicare HMO | Admitting: Family Medicine

## 2023-04-24 ENCOUNTER — Encounter: Payer: Self-pay | Admitting: Family Medicine

## 2023-04-24 VITALS — BP 101/67 | HR 75 | Temp 98.2°F | Resp 18 | Ht 61.0 in | Wt 137.4 lb

## 2023-04-24 DIAGNOSIS — E538 Deficiency of other specified B group vitamins: Secondary | ICD-10-CM

## 2023-04-24 DIAGNOSIS — F411 Generalized anxiety disorder: Secondary | ICD-10-CM

## 2023-04-24 DIAGNOSIS — E785 Hyperlipidemia, unspecified: Secondary | ICD-10-CM

## 2023-04-24 DIAGNOSIS — G4440 Drug-induced headache, not elsewhere classified, not intractable: Secondary | ICD-10-CM | POA: Diagnosis not present

## 2023-04-24 DIAGNOSIS — I10 Essential (primary) hypertension: Secondary | ICD-10-CM

## 2023-04-24 MED ORDER — AMLODIPINE BESYLATE 5 MG PO TABS
5.0000 mg | ORAL_TABLET | Freq: Every day | ORAL | 3 refills | Status: DC
Start: 2023-04-24 — End: 2023-12-05

## 2023-04-24 MED ORDER — CYANOCOBALAMIN 1000 MCG/ML IJ SOLN
1000.0000 ug | Freq: Once | INTRAMUSCULAR | Status: AC
Start: 2023-04-24 — End: 2023-04-24
  Administered 2023-04-24: 1000 ug via INTRAMUSCULAR

## 2023-04-24 MED ORDER — KETOROLAC TROMETHAMINE 60 MG/2ML IM SOLN
60.0000 mg | Freq: Once | INTRAMUSCULAR | Status: AC
Start: 2023-04-24 — End: 2023-04-24
  Administered 2023-04-24: 60 mg via INTRAMUSCULAR

## 2023-04-24 MED ORDER — ATORVASTATIN CALCIUM 40 MG PO TABS
40.0000 mg | ORAL_TABLET | Freq: Every day | ORAL | 3 refills | Status: DC
Start: 2023-04-24 — End: 2024-03-13

## 2023-04-24 MED ORDER — BUPROPION HCL ER (XL) 150 MG PO TB24
150.0000 mg | ORAL_TABLET | Freq: Every day | ORAL | 1 refills | Status: DC
Start: 2023-04-24 — End: 2023-12-28

## 2023-04-24 MED ORDER — PILOCARPINE HCL 5 MG PO TABS: 5.0000 mg | ORAL_TABLET | Freq: Two times a day (BID) | ORAL | 1 refills | Status: DC

## 2023-04-24 NOTE — Patient Instructions (Addendum)
It was very nice to see you today!  See GI about swallowing-[phone]-get appt.  Tell them had x-rays done-barium swallow   PLEASE NOTE:  If you had any lab tests please let us know if you have not heard back within a few days. You may see your results on MyChart before we have a chance to review them but we will give you a call once they are reviewed by [place]. If we ordered any referrals today, please let us know if you have not heard from their office within the next week.   Please try these tips to maintain a healthy lifestyle:  Eat most of your calories during the day when you are active. Eliminate processed foods including packaged sweets (pies, cakes, cookies), reduce intake of potatoes, white bread, white pasta, and white rice. Look for whole grain options, oat flour or almond flour.  Each meal should contain half fruits/vegetables, one quarter protein, and one quarter carbs (no bigger than a computer mouse).  Cut down on sweet beverages. This includes juice, soda, and sweet tea. Also watch fruit intake, though this is a healthier sweet option, it still contains natural sugar! Limit to 3 servings daily.  Drink at least 1 glass of water with each meal and aim for at least 8 glasses per day  Exercise at least 150 minutes every week.

## 2023-04-24 NOTE — Assessment & Plan Note (Signed)
Chronic. B12 injection given today

## 2023-04-24 NOTE — Assessment & Plan Note (Signed)
Chronic.  Mostly controlled.  Continue Wellbutrin XL 150 mg daily and sertraline 75 mg daily.  She is having a bit of a flare today due to stressful event recently

## 2023-04-24 NOTE — Assessment & Plan Note (Signed)
Chronic.  Controlled.  Continue atorvastatin 40 mg daily

## 2023-04-24 NOTE — Progress Notes (Signed)
Subjective:     Patient ID: Tracey Saunders, female    DOB: [birth year], 67 y.o.   MRN: [id number]  Chief Complaint  Patient presents with   Medical Management of Chronic Issues    6 month follow-up on htn, cholesterol, B12 Fasting Fell about 3 weeks ago, had a bump on head   Headache    Non-stop headaches    HPI HTN-  Pt is on Amlodipine 5 mg. Bp's running 102/156/64-113-has been VERY stressed lately.  No cp/palp/edema/cough/sob. Bp at today's visit was 101/67.   Fall - Pt states about 3 weeks ago she fell while pushing her laundry basket. Endorses a knot on her head that remained painful for days after the fall.  Did not go to ED  Headaches - Endorses non-stop headaches nearly everyday. She states they causes extreme pain that prevents her from moving her head, endorses light and sound sensitivity. Requesting toradol today.   HLD - Compliant with Atorvastatin 40 mg.  5.  GAD-no SI.  Meds holding mostly-zoloft and wellbutrin.  Had stressful event happen lately and has exacerbated her so bp up.   Health Maintenance Due  Topic Date Due   Hepatitis C Screening  Never done   DTaP/Tdap/Td (1 - Tdap) Never done    Past Medical History:  Diagnosis Date   Anxiety disorder 09/09/2019   B12 deficiency    Cancer (HCC)    cervical cancer   Depression    Diverticulitis    Fatty liver    GERD (gastroesophageal reflux disease)    Hyperlipidemia    Hypertension    Insomnia 09/09/2019   Liver cyst    Vitamin D deficiency     Past Surgical History:  Procedure Laterality Date   ABDOMINAL HYSTERECTOMY     COLON RESECTION     due to diverticulitis   SHOULDER ARTHROSCOPY WITH ROTATOR CUFF REPAIR Right 11/15/2022   Procedure: RIGHT SHOULDER ARTHROSCOPY WITH BICEPS TENODESIS;  Surgeon: Huel Cote, MD;  Location: [facility];  Service: Orthopedics;  Laterality: Right;     Current Outpatient Medications:    aspirin EC 325 MG tablet, Take 1 tablet (325 mg total) by  mouth daily., Disp: 14 tablet, Rfl: 0   azelastine (ASTELIN) 0.1 % nasal spray, Place 2 sprays into both nostrils 2 (two) times daily. Use in each nostril as directed, Disp: 30 mL, Rfl: 5   celecoxib (CELEBREX) 100 MG capsule, Take 1 capsule (100 mg total) by mouth 2 (two) times daily., Disp: 30 capsule, Rfl: 4   desloratadine (CLARINEX) 5 MG tablet, Take 1 tablet (5 mg total) by mouth daily., Disp: 90 tablet, Rfl: 3   diclofenac Sodium (VOLTAREN) 1 % GEL, Apply 4 g topically 4 (four) times daily as needed., Disp: 100 g, Rfl: 3   esomeprazole (NEXIUM) 40 MG capsule, Take 1 capsule (40 mg total) by mouth daily., Disp: 90 capsule, Rfl: 1   fluticasone (FLONASE) 50 MCG/ACT nasal spray, Place 2 sprays into both nostrils daily., Disp: 16 g, Rfl: 6   furosemide (LASIX) 20 MG tablet, TAKE 1 TABLET TWICE A DAY, Disp: 180 tablet, Rfl: 3   gabapentin (NEURONTIN) 300 MG capsule, TAKE 2 CAPSULES(600MG       TOTAL) 3 TIMES A DAY, Disp: 540 capsule, Rfl: 1   Hot/Cold Therapy Aids (HOT/COLD PACK) PADS, Use as directed, Disp: 3 each, Rfl: 0   Humidifiers MISC, Use as needed to prevent dry mouth, Disp: 1 each, Rfl: 0   levocetirizine (XYZAL) 5 MG  tablet, Take 5 mg by mouth 2 (two) times daily as needed., Disp: , Rfl:    methocarbamol (ROBAXIN) 500 MG tablet, Take 1 tablet (500 mg total) by mouth 4 (four) times daily., Disp: 30 tablet, Rfl: 3   potassium chloride SA (KLOR-CON M) 20 MEQ tablet, Take 1 tablet (20 mEq total) by mouth 2 (two) times daily., Disp: 180 tablet, Rfl: 1   sertraline (ZOLOFT) 50 MG tablet, TAKE 1 AND 1/2 TABLETS AT  BEDTIME, Disp: 135 tablet, Rfl: 3   tizanidine (ZANAFLEX) 2 MG capsule, Take 1 capsule (2 mg total) by mouth 3 (three) times daily., Disp: 30 capsule, Rfl: 1   traZODone (DESYREL) 100 MG tablet, Take 1 tablet (100 mg total) by mouth at bedtime as needed for sleep. Please note, did larger dose so only 1 tab, Disp: 90 tablet, Rfl: 3   amLODipine (NORVASC) 5 MG tablet, Take 1 tablet (5  mg total) by mouth daily., Disp: 90 tablet, Rfl: 3   atorvastatin (LIPITOR) 40 MG tablet, Take 1 tablet (40 mg total) by mouth daily., Disp: 90 tablet, Rfl: 3   buPROPion (WELLBUTRIN XL) 150 MG 24 hr tablet, Take 1 tablet (150 mg total) by mouth daily., Disp: 90 tablet, Rfl: 1   pilocarpine (SALAGEN) 5 MG tablet, Take 1 tablet (5 mg total) by mouth 2 (two) times daily., Disp: 60 tablet, Rfl: 1  Allergies  Allergen Reactions   Lisinopril Swelling   Losartan Swelling   Pseudoephedrine-Naproxen Na Er     Other Reaction(s): Other (See Comments)   ROS neg/noncontributory except as noted HPI/below      Objective:     BP 101/67   Pulse 75   Temp 98.2 F (36.8 C) (Temporal)   Resp 18   Ht 5\' 1"  (1.549 m)   Wt 137 lb 6 oz (62.3 kg)   SpO2 95%   BMI 25.96 kg/m  Wt Readings from Last 3 Encounters:  04/24/23 137 lb 6 oz (62.3 kg)  04/06/23 135 lb (61.2 kg)  02/20/23 135 lb 3.2 oz (61.3 kg)    Physical Exam   Gen: WDWN NAD HEENT: NCAT, conjunctiva not injected, sclera nonicteric NECK:  supple, no thyromegaly, no nodes, no carotid bruits CARDIAC: RRR, S1S2+, no murmur. DP 2+B LUNGS: CTAB. No wheezes ABDOMEN:  BS+, soft, NTND, No HSM, no masses EXT:  no edema MSK: no gross abnormalities. +cane NEURO: A&O x3.  CN II-XII intact.  PSYCH: normal mood. Good eye contact +sore spot on right scalp-no lesion, no more hematoma    Assessment & Plan:  Essential hypertension Assessment & Plan: Chronic.  Controlled.  Continue amlodipine 5 mg, Lasix 20 mg twice daily  Orders: -     amLODIPine Besylate; Take 1 tablet (5 mg total) by mouth daily.  Dispense: 90 tablet; Refill: 3  Hyperlipidemia, unspecified hyperlipidemia type Assessment & Plan: Chronic.  Controlled.  Continue atorvastatin 40 mg daily  Orders: -     Atorvastatin Calcium; Take 1 tablet (40 mg total) by mouth daily.  Dispense: 90 tablet; Refill: 3  Vitamin B12 deficiency Assessment & Plan: Chronic. B12 injection given  today   Orders: -     Cyanocobalamin  Generalized anxiety disorder Assessment & Plan: Chronic.  Mostly controlled.  Continue Wellbutrin XL 150 mg daily and sertraline 75 mg daily.  She is having a bit of a flare today due to stressful event recently  Orders: -     buPROPion HCl ER (XL); Take 1 tablet (150 mg total)  by mouth daily.  Dispense: 90 tablet; Refill: 1  Headache, drug induced Assessment & Plan: Chronic daily ha.  Not sure if meds, migraine, spinal stenosis, other.  Patient comes to office monthly (mostly for B12 injections, but follow-up) requests Toradol shot every time she comes in.  Will refer to neurology  Orders: -     Ketorolac Tromethamine -     Ambulatory referral to Neurology  Other orders -     Pilocarpine HCl; Take 1 tablet (5 mg total) by mouth 2 (two) times daily.  Dispense: 60 tablet; Refill: 1   Return in about 4 weeks (around 05/22/2023) for HTN.  Germaine Pomfret Rice,acting as a scribe for Angelena Sole, MD.,have documented all relevant documentation on the behalf of Angelena Sole, MD,as directed by  Angelena Sole, MD while in the presence of Angelena Sole, MD.   I, Angelena Sole, MD, have reviewed all documentation for this visit. The documentation on 04/24/23 for the exam, diagnosis, procedures, and orders are all accurate and complete.   Angelena Sole, MD

## 2023-04-24 NOTE — Assessment & Plan Note (Signed)
Chronic daily ha.  Not sure if meds, migraine, spinal stenosis, other.  Patient comes to office monthly (mostly for B12 injections, but follow-up) requests Toradol shot every time she comes in.  Will refer to neurology

## 2023-04-24 NOTE — Assessment & Plan Note (Signed)
Chronic.  Controlled.  Continue amlodipine 5 mg, Lasix 20 mg twice daily

## 2023-04-25 ENCOUNTER — Encounter: Payer: Self-pay | Admitting: Neurology

## 2023-05-22 DIAGNOSIS — M1711 Unilateral primary osteoarthritis, right knee: Secondary | ICD-10-CM | POA: Diagnosis not present

## 2023-05-22 DIAGNOSIS — M1712 Unilateral primary osteoarthritis, left knee: Secondary | ICD-10-CM | POA: Diagnosis not present

## 2023-05-23 ENCOUNTER — Encounter: Payer: Self-pay | Admitting: Family Medicine

## 2023-05-23 ENCOUNTER — Ambulatory Visit: Payer: Medicare HMO | Admitting: Family Medicine

## 2023-05-23 VITALS — BP 120/79 | HR 74 | Temp 98.0°F | Resp 18 | Ht 61.0 in | Wt 136.4 lb

## 2023-05-23 DIAGNOSIS — E559 Vitamin D deficiency, unspecified: Secondary | ICD-10-CM | POA: Diagnosis not present

## 2023-05-23 DIAGNOSIS — E7800 Pure hypercholesterolemia, unspecified: Secondary | ICD-10-CM | POA: Diagnosis not present

## 2023-05-23 DIAGNOSIS — F3342 Major depressive disorder, recurrent, in full remission: Secondary | ICD-10-CM

## 2023-05-23 DIAGNOSIS — I10 Essential (primary) hypertension: Secondary | ICD-10-CM | POA: Diagnosis not present

## 2023-05-23 DIAGNOSIS — E538 Deficiency of other specified B group vitamins: Secondary | ICD-10-CM

## 2023-05-23 DIAGNOSIS — G44221 Chronic tension-type headache, intractable: Secondary | ICD-10-CM | POA: Diagnosis not present

## 2023-05-23 LAB — VITAMIN D 25 HYDROXY (VIT D DEFICIENCY, FRACTURES): VITD: 58.49 ng/mL (ref 30.00–100.00)

## 2023-05-23 LAB — COMPREHENSIVE METABOLIC PANEL
ALT: 12 U/L (ref 0–35)
AST: 15 U/L (ref 0–37)
Albumin: 4.4 g/dL (ref 3.5–5.2)
Alkaline Phosphatase: 81 U/L (ref 39–117)
BUN: 16 mg/dL (ref 6–23)
CO2: 27 meq/L (ref 19–32)
Calcium: 9.3 mg/dL (ref 8.4–10.5)
Chloride: 101 meq/L (ref 96–112)
Creatinine, Ser: 0.95 mg/dL (ref 0.40–1.20)
GFR: 61.35 mL/min (ref >60.00)
Glucose, Bld: 95 mg/dL (ref 70–99)
Potassium: 4 meq/L (ref 3.5–5.1)
Sodium: 138 meq/L (ref 135–145)
Total Bilirubin: 0.4 mg/dL (ref 0.2–1.2)
Total Protein: 6.8 g/dL (ref 6.0–8.3)

## 2023-05-23 LAB — CBC WITH DIFFERENTIAL/PLATELET
Basophils Absolute: 0.1 10*3/uL (ref 0.0–0.1)
Basophils Relative: 1 % (ref 0.0–3.0)
Eosinophils Absolute: 0.1 10*3/uL (ref 0.0–0.7)
Eosinophils Relative: 1.2 % (ref 0.0–5.0)
HCT: 38 % (ref 36.0–46.0)
Hemoglobin: 12.6 g/dL (ref 12.0–15.0)
Lymphocytes Relative: 26.1 % (ref 12.0–46.0)
Lymphs Abs: 2.2 10*3/uL (ref 0.7–4.0)
MCHC: 33.1 g/dL (ref 30.0–36.0)
MCV: 96 fL (ref 78.0–100.0)
Monocytes Absolute: 0.6 10*3/uL (ref 0.1–1.0)
Monocytes Relative: 6.7 % (ref 3.0–12.0)
Neutro Abs: 5.5 10*3/uL (ref 1.4–7.7)
Neutrophils Relative %: 65 % (ref 43.0–77.0)
Platelets: 266 10*3/uL (ref 150.0–400.0)
RBC: 3.96 Mil/uL (ref 3.87–5.11)
RDW: 13.1 % (ref 11.5–15.5)
WBC: 8.4 10*3/uL (ref 4.0–10.5)

## 2023-05-23 LAB — LIPID PANEL
Cholesterol: 158 mg/dL (ref 0–200)
HDL: 59.8 mg/dL (ref >39.00)
LDL Cholesterol: 84 mg/dL (ref 0–99)
NonHDL: 97.99
Total CHOL/HDL Ratio: 3
Triglycerides: 71 mg/dL (ref 0.0–149.0)
VLDL: 14.2 mg/dL (ref 0.0–40.0)

## 2023-05-23 MED ORDER — KETOROLAC TROMETHAMINE 60 MG/2ML IM SOLN
60.0000 mg | Freq: Once | INTRAMUSCULAR | Status: AC
  Administered 2023-05-23: 60 mg via INTRAMUSCULAR

## 2023-05-23 MED ORDER — CYANOCOBALAMIN 1000 MCG/ML IJ SOLN
1000.0000 ug | Freq: Once | INTRAMUSCULAR | Status: AC
  Administered 2023-05-23: 1000 ug via INTRAMUSCULAR

## 2023-05-23 MED ORDER — BLOOD PRESSURE KIT: 1.0000 | PACK | 0 refills | Status: AC

## 2023-05-23 MED ORDER — PROPRANOLOL HCL 10 MG PO TABS
10.0000 mg | ORAL_TABLET | Freq: Two times a day (BID) | ORAL | 1 refills | Status: DC | PRN
Start: 2023-05-23 — End: 2023-07-21

## 2023-05-23 MED ORDER — AMOXICILLIN 500 MG PO CAPS: 500.0000 mg | ORAL_CAPSULE | Freq: Three times a day (TID) | ORAL | 0 refills | Status: AC

## 2023-05-23 NOTE — Progress Notes (Signed)
Subjective:     Patient ID: Tracey Saunders, female    DOB: 20-Jan-1956, 67 y.o.   MRN: [id number]  Chief Complaint  Patient presents with   Medical Management of Chronic Issues    4 week follow-up on htn Daily headaches      HPI  HTN - Pt is on Amlodipine 5 mg. Monitors her BP every morning, usually high in the morning. If her BP is high, she will also check her BP in the afternoon/evenings. BP's running 141-159/91-100 with one low reading of 87/58. She has had her BP monitor for about 4 years and believes she may need to get a new one. She has tried putting in new batteries. Endorses chronic headaches, has one most mornings, especially in the last week. Pt is receptive to starting propanolol to manage BP and possibly improve headaches. No dizziness/cp/palp/edema/cough/sob.  Headaches - She complains of persistent headaches daily. Pt reports occasional difficulty walking due to her headaches. Not currently taking any medications to treat or manage headaches. She drinks a couple cups of coffee in the morning and has tea in the evenings. She doesn't use artificial sweeteners and notes she only uses small amounts of sugar. Drinks a beer very occasionally, last time she had a beer was about 3 months ago. Pt smokes, but reports she has significantly cut back. She has tried a sleep study over 10 years ago, though she left early since she couldn't sleep well. She is agreeable to do at-home sleep study only. Has appointment with Dr. Everlena Cooper of neurology on 11/19.   Arthritis - No improvements with her arthritis. States her pain and swelling has worsened. She reports knitting and crocheting since she was 67 years old.   HLD - Taking Atorvastatin 40 mg.   Has appointment with speech therapy tomorrow for chronic throat irritations. ENT placed referral. Today, throat really sore   Health Maintenance Due  Topic Date Due   Hepatitis C Screening  Never done   DTaP/Tdap/Td (1 - Tdap) Never done     Past Medical History:  Diagnosis Date   Anxiety disorder 09/09/2019   B12 deficiency    Cancer (HCC)    cervical cancer   Depression    Diverticulitis    Fatty liver    GERD (gastroesophageal reflux disease)    Hyperlipidemia    Hypertension    Insomnia 09/09/2019   Liver cyst    Vitamin D deficiency     Past Surgical History:  Procedure Laterality Date   ABDOMINAL HYSTERECTOMY     COLON RESECTION     due to diverticulitis   SHOULDER ARTHROSCOPY WITH ROTATOR CUFF REPAIR Right 11/15/2022   Procedure: RIGHT SHOULDER ARTHROSCOPY WITH BICEPS TENODESIS;  Surgeon: Huel Cote, MD;  Location: [facility];  Service: Orthopedics;  Laterality: Right;     Current Outpatient Medications:    amLODipine (NORVASC) 5 MG tablet, Take 1 tablet (5 mg total) by mouth daily., Disp: 90 tablet, Rfl: 3   amoxicillin (AMOXIL) 500 MG capsule, Take 1 capsule (500 mg total) by mouth 3 (three) times daily for 10 days., Disp: 30 capsule, Rfl: 0   aspirin EC 325 MG tablet, Take 1 tablet (325 mg total) by mouth daily., Disp: 14 tablet, Rfl: 0   atorvastatin (LIPITOR) 40 MG tablet, Take 1 tablet (40 mg total) by mouth daily., Disp: 90 tablet, Rfl: 3   azelastine (ASTELIN) 0.1 % nasal spray, Place 2 sprays into both nostrils 2 (two) times daily. Use in  each nostril as directed, Disp: 30 mL, Rfl: 5   Blood Pressure KIT, 1 each by Does not apply route once a week., Disp: 1 kit, Rfl: 0   buPROPion (WELLBUTRIN XL) 150 MG 24 hr tablet, Take 1 tablet (150 mg total) by mouth daily., Disp: 90 tablet, Rfl: 1   celecoxib (CELEBREX) 100 MG capsule, Take 1 capsule (100 mg total) by mouth 2 (two) times daily., Disp: 30 capsule, Rfl: 4   desloratadine (CLARINEX) 5 MG tablet, Take 1 tablet (5 mg total) by mouth daily., Disp: 90 tablet, Rfl: 3   diclofenac Sodium (VOLTAREN) 1 % GEL, Apply 4 g topically 4 (four) times daily as needed., Disp: 100 g, Rfl: 3   esomeprazole (NEXIUM) 40 MG capsule, Take 1  capsule (40 mg total) by mouth daily., Disp: 90 capsule, Rfl: 1   fluticasone (FLONASE) 50 MCG/ACT nasal spray, Place 2 sprays into both nostrils daily., Disp: 16 g, Rfl: 6   furosemide (LASIX) 20 MG tablet, TAKE 1 TABLET TWICE A DAY, Disp: 180 tablet, Rfl: 3   gabapentin (NEURONTIN) 300 MG capsule, TAKE 2 CAPSULES(600MG       TOTAL) 3 TIMES A DAY, Disp: 540 capsule, Rfl: 1   Hot/Cold Therapy Aids (HOT/COLD PACK) PADS, Use as directed, Disp: 3 each, Rfl: 0   Humidifiers MISC, Use as needed to prevent dry mouth, Disp: 1 each, Rfl: 0   levocetirizine (XYZAL) 5 MG tablet, Take 5 mg by mouth 2 (two) times daily as needed., Disp: , Rfl:    methocarbamol (ROBAXIN) 500 MG tablet, Take 1 tablet (500 mg total) by mouth 4 (four) times daily., Disp: 30 tablet, Rfl: 3   pilocarpine (SALAGEN) 5 MG tablet, Take 1 tablet (5 mg total) by mouth 2 (two) times daily., Disp: 60 tablet, Rfl: 1   potassium chloride SA (KLOR-CON M) 20 MEQ tablet, Take 1 tablet (20 mEq total) by mouth 2 (two) times daily., Disp: 180 tablet, Rfl: 1   propranolol (INDERAL) 10 MG tablet, Take 1 tablet (10 mg total) by mouth 2 (two) times daily as needed (anxiety)., Disp: 60 tablet, Rfl: 1   sertraline (ZOLOFT) 50 MG tablet, TAKE 1 AND 1/2 TABLETS AT  BEDTIME, Disp: 135 tablet, Rfl: 3   tizanidine (ZANAFLEX) 2 MG capsule, Take 1 capsule (2 mg total) by mouth 3 (three) times daily., Disp: 30 capsule, Rfl: 1   traZODone (DESYREL) 100 MG tablet, Take 1 tablet (100 mg total) by mouth at bedtime as needed for sleep. Please note, did larger dose so only 1 tab, Disp: 90 tablet, Rfl: 3  Allergies  Allergen Reactions   Lisinopril Swelling   Losartan Swelling   Pseudoephedrine-Naproxen Na Er     Other Reaction(s): Other (See Comments)   ROS neg/noncontributory except as noted HPI/below      Objective:     BP 120/79   Pulse 74   Temp 98 F (36.7 C) (Temporal)   Resp 18   Ht 5\' 1"  (1.549 m)   Wt 136 lb 6 oz (61.9 kg)   SpO2 99%   BMI  25.77 kg/m  Wt Readings from Last 3 Encounters:  05/23/23 136 lb 6 oz (61.9 kg)  04/24/23 137 lb 6 oz (62.3 kg)  04/06/23 135 lb (61.2 kg)    Physical Exam   Gen: WDWN NAD HEENT: NCAT, conjunctiva not injected, sclera nonicteric. OP moist, no exudates. +Throat is very red NECK:  supple, no thyromegaly, no nodes, no carotid bruits CARDIAC: RRR, S1S2+, no murmur. DP  2+B LUNGS: CTAB. No wheezes ABDOMEN:  BS+, soft, NTND, No HSM, no masses EXT:  no edema MSK: no gross abnormalities.  NEURO: A&O x3.  CN II-XII intact.  PSYCH: normal mood. Good eye contact     Assessment & Plan:  Essential hypertension Assessment & Plan: Chronic.  Controlled.  Continue amlodipine 5 mg, Lasix 20 mg twice daily.  At home, pt getting higher bp's.  She will try to get new cuff.  Daily ha.   Orders: -     Blood Pressure; 1 each by Does not apply route once a week.  Dispense: 1 kit; Refill: 0 -     CBC with Differential/Platelet -     Comprehensive metabolic panel  Chronic tension-type headache, intractable Assessment & Plan: Chronic.  Not controlled.  Needs to decrease caffeine use.  Toradol given today.  Will add propranolol 10mg  bid to try to help.  May have OSA as well-willing to do in home sleep study.  Seeing neuro soon.   Orders: -     Propranolol HCl; Take 1 tablet (10 mg total) by mouth 2 (two) times daily as needed (anxiety).  Dispense: 60 tablet; Refill: 1 -     MR BRAIN WO CONTRAST; Future -     Ketorolac Tromethamine  Pure hypercholesterolemia Assessment & Plan: Chronic.  Controlled.  Continue atorvastatin 40 mg daily  Orders: -     Comprehensive metabolic panel -     Lipid panel  Vitamin B12 deficiency Assessment & Plan: Chronic. B12 injection given today   Orders: -     Cyanocobalamin  Vitamin D deficiency Assessment & Plan: Chronic.  On meds in past.  Check levels  Orders: -     VITAMIN D 25 Hydroxy (Vit-D Deficiency, Fractures)  Recurrent major depressive disorder,  in full remission (HCC) Assessment & Plan: Chronic.  Controlled w/zoloft 75mg  and wellbutrin 150mg  daily   Other orders -     Amoxicillin; Take 1 capsule (500 mg total) by mouth 3 (three) times daily for 10 days.  Dispense: 30 capsule; Refill: 0    Return in about 4 weeks (around 06/20/2023) for HTN, B12.    I, Isabelle Course, acting as a scribe for Angelena Sole, MD., have documented all relevant documentation on the behalf of Angelena Sole, MD, as directed by  Angelena Sole, MD while in the presence of Angelena Sole, MD.  I, Angelena Sole, MD, have reviewed all documentation for this visit. The documentation on 05/25/23 for the exam, diagnosis, procedures, and orders are all accurate and complete.  Angelena Sole, MD

## 2023-05-23 NOTE — Patient Instructions (Signed)
It was very nice to see you today!  Start propranolol for headaches   PLEASE NOTE:  If you had any lab tests please let us know if you have not heard back within a few days. You may see your results on MyChart before we have a chance to review them but we will give you a call once they are reviewed by [place]. If we ordered any referrals today, please let us know if you have not heard from their office within the next week.   Please try these tips to maintain a healthy lifestyle:  Eat most of your calories during the day when you are active. Eliminate processed foods including packaged sweets (pies, cakes, cookies), reduce intake of potatoes, white bread, white pasta, and white rice. Look for whole grain options, oat flour or almond flour.  Each meal should contain half fruits/vegetables, one quarter protein, and one quarter carbs (no bigger than a computer mouse).  Cut down on sweet beverages. This includes juice, soda, and sweet tea. Also watch fruit intake, though this is a healthier sweet option, it still contains natural sugar! Limit to 3 servings daily.  Drink at least 1 glass of water with each meal and aim for at least 8 glasses per day  Exercise at least 150 minutes every week.

## 2023-05-23 NOTE — Therapy (Unsigned)
OUTPATIENT SPEECH LANGUAGE PATHOLOGY VOICE EVALUATION   Patient Name: Tracey Saunders MRN: [id number] DOB:1955-07-28, 67 y.o., female Today's Date: 05/24/2023  PCP: Jeani Sow MD REFERRING PROVIDER: Ashok Croon, MD  END OF SESSION:  End of Session - 05/24/23 1121     Visit Number 1    Number of Visits 1    SLP Start Time 1020    SLP Stop Time  1103    SLP Time Calculation (min) 43 min    Activity Tolerance Patient tolerated treatment well             Past Medical History:  Diagnosis Date   Anxiety disorder 09/09/2019   B12 deficiency    Cancer (HCC)    cervical cancer   Depression    Diverticulitis    Fatty liver    GERD (gastroesophageal reflux disease)    Hyperlipidemia    Hypertension    Insomnia 09/09/2019   Liver cyst    Vitamin D deficiency    Past Surgical History:  Procedure Laterality Date   ABDOMINAL HYSTERECTOMY     COLON RESECTION     due to diverticulitis   SHOULDER ARTHROSCOPY WITH ROTATOR CUFF REPAIR Right 11/15/2022   Procedure: RIGHT SHOULDER ARTHROSCOPY WITH BICEPS TENODESIS;  Surgeon: Huel Cote, MD;  Location: [facility];  Service: Orthopedics;  Laterality: Right;   Patient Active Problem List   Diagnosis Date Noted   Subacromial impingement of right shoulder 11/15/2022   Biceps tendinitis of right shoulder 11/15/2022   Asthmatic bronchitis , chronic (HCC) 12/15/2021   Chronic right-sided low back pain with sciatica 06/22/2020   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   Headache, drug induced 03/12/2020   Hair thinning 11/12/2019   Recurrent major depressive disorder, in full remission (HCC) 11/04/2019   Gastroesophageal reflux disease 09/09/2019   Insomnia 09/09/2019   Anxiety disorder 09/09/2019   Essential hypertension 06/03/2019   Hyperlipidemia 06/03/2019   History of cancer 04/29/2019   Vitamin B12 deficiency 04/29/2019   Vitamin D deficiency 04/29/2019    Onset date: 04/06/23 (referral  date)  REFERRING DIAG: R49.0 (ICD-10-CM) - Chronic hoarseness  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 female with hx of chronic dry mouth, could not afford Pilocarpine when it was prescribed (had it for years), here for 3-4 months of persistent hoarseness. Current smoker, cut down to two cigarettes a day.  Hx of GERD on Nexium. She denies pain with talking or swallowing. She was previously on Xyzal and Flonase for allergies and post-nasal drainage, not any more.  She tried Biotene for dry mouth and couldn't tolerate the taste. She had EGD and balloon dilation due to trouble with swallowing, a few months ago, but had no relief of sx. Has trouble swallowing and sensation of food stuck in her throat. She denies dyspnea. She has sinus infections from time to time, and has post-nasal drainage, nasal congestion. She has reduced sense of smell.  She reports a need to blow her nose frequently and at times coughs up clear and brown mucus."   PAIN:  Are you having pain? Yes: NPRS scale: 7/10 Pain location: throat Pain description: scratchy and sore Aggravating factors: reported throat infection Relieving factors: antibiotics  FALLS: Has patient fallen in last 6 months? No  LIVING ENVIRONMENT: Lives with: lives alone Lives  in: House/apartment  PLOF:Level of assistance: Independent with ADLs, Independent with IADLs Employment: Retired  OBJECTIVE:  Note: Objective measures were completed at Evaluation unless otherwise noted.  DIAGNOSTIC FINDINGS: Flexible fiberoptic laryngoscopy with stroboscopy (04/06/23) "The nasopharynx was patent without mass or lesion. The scope was passed behind the soft palate and directed toward the base of tongue. The base of tongue was visualized and was symmetric  with no apparent masses or abnormal appearing tissue. There were no signs of a mass or pooling of secretions in the piriform sinuses. The supraglottic structures were normal.   The true vocal cords are mobile. The medial edges were slightly bowed. Closure was incomplete with small spindle-shaped glottic gap. Periodicity present. The mucosal wave and amplitude were normal. There is moderate interarytenoid pachydermia and post cricoid edema. The mucosa appears healthy without lesions.   The laryngoscope was then slowly withdrawn and the patient tolerated the procedure well. There were no complications or blood loss."  COGNITION: Overall cognitive status: History of cognitive impairments - at baseline Functional deficits: endorsed age-related memory mistakes, not overtly concerned  SOCIAL HISTORY: Occupation: Retired Counsellor intake: suboptimal  Caffeine/alcohol intake: moderate Daily voice use: moderate  PERCEPTUAL VOICE ASSESSMENT: Voice quality: hoarse and rough Vocal abuse: habitual throat clearing and coughing Resonance: normal Respiratory function: thoracic breathing, clavicular breathing, and speaking on residual capacity  OBJECTIVE VOICE ASSESSMENT: Maximum phonation time for sustained "ah": 5 seconds (indicating reduced breath support) Conversational loudness average: 71 dB Conversational loudness range: 68-78 dB  PATIENT REPORTED OUTCOME MEASURES (PROM): RSI: 43 (> 13 indicative of LPR) and Communication Participation Item Bank: 4 (questions accuracy of PROM due to reduced comprehension of questions despite SLP A)  The Communicative Participation Item Bank        Does your condition interfere with... Pt Rating   ...talking with people you know 1   ...communicating when you need to say something quickly 0   ...talking with people you do not know 0   ...communicating when you are out in your community 0   ...asking questions in a conversation 0   ....communicating in a small  group of people 2   ...having a long conversation 1   ...giving detailed infomrmation 0   ...getting your turn in a fast moving conversation 0   ...trying to persuade a friend or family member to see a different point of view 0  3= Not at all; 2=A little; 1=Quite a bit; 0=Very much   TODAY'S TREATMENT:                                                                                                                                         05-24-23: eval only   PATIENT EDUCATION: Education details: Rec for voice therapy; reflux precautions  Person educated: Patient Education method: Explanation and Handouts Education comprehension: verbalized understanding and needs further education  ASSESSMENT:  CLINICAL IMPRESSION: Patient  is a 67 y.o. F who was seen for ST evaluation for chronic hoarseness. ENT visit on 04/06/23 revealed slight medial vocal fold bowing resulting in incomplete closure, as well as moderate interarytenoid pachydermia and post cricoid edema. MBSS completed 04/19/23, which indicated funtional orophayrngeal swallow with suspected esophageal impairments, as evidenced by presence of mild barium retrograde and retention in distal esophagus. Scheduled w/ GI in January. During today's evaluation, pt presented with mild rough, hoarse vocal quality in conversation, which improved with increased intensity. Intermittent throat clearing exhibited, which pt correlated to "throat infection" with antibiotic prescribed yesterday. Reported ongoing swallow difficulties, including c/o globus sensation after eating. Based on LPR findings, SLP educated pt on behavioral reflux precautions. SLP recommended voice therapy to address vocal hygiene and vocal cord bowing; however, pt stated that she "is not really interested" in speech therapy. Despite educated benefit, pt declined due to prioritization of other health concerns and financial burden. Informed patient she could request repeat referral in she  would like to address voice in the future.   OBJECTIVE IMPAIRMENTS: include voice disorder. These impairments are limiting patient from effectively communicating at home and in community. Factors affecting potential to achieve goals and functional outcome are ability to learn/carryover information, cooperation/participation level, and financial resources.  PLAN:  SLP FREQUENCY: one time visit   I agree with the following treatment note after reviewing documentation. This session was performed by graduate clinician, under the supervision of a licensed clinician.  Gracy Racer, CCC-SLP 05/24/2023, 11:27 AM

## 2023-05-24 ENCOUNTER — Ambulatory Visit: Payer: Medicare HMO | Attending: Otolaryngology

## 2023-05-24 DIAGNOSIS — R498 Other voice and resonance disorders: Secondary | ICD-10-CM | POA: Insufficient documentation

## 2023-05-24 DIAGNOSIS — R490 Dysphonia: Secondary | ICD-10-CM | POA: Diagnosis not present

## 2023-05-24 NOTE — Patient Instructions (Signed)
 ACID REFLUX can be a possible cause of a voice disorder or throat irritation. Acid reflux is a disorder where acid from your stomach is abnormally spilled over onto your voice box after eating, during sleep, or even during singing. Acid reflux causes irritation and inflammation to your vocal folds and should be avoided and treated by changing eating habits, changing lifestyle, and taking medication (if prescribed by your doctor).   CHANGE EATING HABITS   Avoid "trigger" foods. Certain foods and drinks can trigger acid reflux.  1. Caffeine- in coffee, tea, chocolate, sodas  2. Carbonated beverages  3. Mint and menthol  4. Fatty/fried foods  5. Citrus fruits  6. Tomato products  7. Spicy foods  8. Alcohol    CHANGING LIFESTYLE HABITS   Drink 8 glasses of water per day (64oz)    Stop smoking   Avoid clearing your throat   Allow 3 hours between last big meal and going to bed at night   Keep yourself upright for one hour after you eat   Elevate the head of your bed using 6-inch blocks under the head of the bed or a bed wedge between the box spring and the mattress.   Eat small meals throughout the day rather than 3 big meals   Eat slowly   Wear loose clothing   TAKING MEDICATION   If prescribed one time a day, take 15-30 minutes before breakfast   If prescribed two times a day, take 15-30 minutes before breakfast and 15- 30 minutes before dinner   CHANGING THE WAY YOU USE YOUR VOICE  "Best voice/ Least effort"

## 2023-05-25 DIAGNOSIS — G44221 Chronic tension-type headache, intractable: Secondary | ICD-10-CM | POA: Insufficient documentation

## 2023-05-25 NOTE — Assessment & Plan Note (Signed)
Chronic.  Controlled.  Continue amlodipine 5 mg, Lasix 20 mg twice daily.  At home, pt getting higher bp's.  She will try to get new cuff.  Daily ha.

## 2023-05-25 NOTE — Assessment & Plan Note (Signed)
Chronic.  Controlled.  Continue atorvastatin 40 mg daily 

## 2023-05-25 NOTE — Progress Notes (Signed)
Labs look great.

## 2023-05-25 NOTE — Assessment & Plan Note (Signed)
Chronic.  Not controlled.  Needs to decrease caffeine use.  Toradol given today.  Will add propranolol 10mg  bid to try to help.  May have OSA as well-willing to do in home sleep study.  Seeing neuro soon.

## 2023-05-25 NOTE — Assessment & Plan Note (Signed)
Chronic. B12 injection given today

## 2023-05-25 NOTE — Assessment & Plan Note (Signed)
Chronic.  On meds in past.  Check levels

## 2023-05-25 NOTE — Assessment & Plan Note (Signed)
Chronic.  Controlled w/zoloft 75mg  and wellbutrin 150mg  daily

## 2023-05-29 DIAGNOSIS — M25532 Pain in left wrist: Secondary | ICD-10-CM | POA: Diagnosis not present

## 2023-05-29 DIAGNOSIS — M25531 Pain in right wrist: Secondary | ICD-10-CM | POA: Diagnosis not present

## 2023-05-29 DIAGNOSIS — M545 Low back pain, unspecified: Secondary | ICD-10-CM | POA: Diagnosis not present

## 2023-05-29 NOTE — Progress Notes (Unsigned)
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  I also agree with getting the MRI of the brain.  I advised her to contact us when it has been performed.  However, I do think that her cervical spondylosis is likely the cause of her headaches.  Unfortunately, I am limited in what I can provide for the patient.  She has already tried physical therapy.  She has failed muscle relaxants.  She is already on gabapentin.  She is taking 2 antidepressants, so I do not feel comfortable adding or changing one of her medications to another option such as duloxetine or nortriptyline.  I think she would best be seen by spine specialist or interventional pain specialist (as she may respond to injections/cervical nerve ablations).    Total time in chart reviewing notes and imaging and face to face with patient:  56 minutes.   Subjective:  Tracey Saunders is a 67 year old right-handed female with HTN, HLD, fatty liver, GERD, anxiety, depression and history of cervical cancer who presents for headache.  History supplemented by referring provider's note.  She has had daily headaches for 3 years.  Every morning, she wakes up with a headache.  It is occipital pain/soreness but radiates to be a diffuse pounding headache.  Aggravated by neck movements.  Associated dizziness, photophobia and phonophobia.  Occasional nausea but not much.  No associated visual disturbance.  They have been persistent.  She reports that she is not taking any pain relievers, over the counter or prescription.  She also has chronic neck pain with burning sensation into the right arm.  PT ineffective.  Previous right shoulder surgery.  MRI of cervical spine on 03/17/2023  personally reviewed revealed multilevel cervical spondylosis, most advanced from C3-4 through C5-6 with mild spinal stenosis and moderate to severe foraminal narrowing bilaterally at C4-5 and C5-6 and on the right at C3-4 but no acute findings or cord compression.  Followed up with orthopedics who discussed either surgery or injections.  She declined both options.  She reports that she is supposed to get a sleep study.  She has MRI of brain scheduled for 12/3.     Past NSAIDS/analgesics:  tramadol Past abortive triptans:  none Past abortive ergotamine:  none Past muscle relaxants:  Robaxin, tizanidine, Flexeril Past anti-emetic:  Zofran Past antihypertensive medications:  lisinopril, losartan, hydrochlorothiazide, furosemide Past antidepressant medications:  none Past anticonvulsant medications:  none Past anti-CGRP:  none Past vitamins/Herbal/Supplements:  B12 Past antihistamines/decongestants:  Claritin Other past therapies:  Physical therapy  Current NSAIDS/analgesics:  celecoxib 100mg  BID Current triptans:  none Current ergotamine:  none Current anti-emetic:  none Current muscle relaxants:  none Current Antihypertensive medications:  propranolol 10mg  BID PRN (anxiety), amlodipine 5mg  daily Current Antidepressant medications:  sertraline 75mg  at bedtime, Wellbutrin XL 150mg  daily Current Anticonvulsant medications:  gabapentin 600mg  three times daily Current anti-CGRP:  none Current Vitamins/Herbal/Supplements:  KCl Current Antihistamines/Decongestants:  Xyzal, Flonase, Astelin NS Other therapy:  none Other medications:  trazodone 100mg  at bedtime, pilocarpine       PAST MEDICAL HISTORY: Past Medical History:  Diagnosis Date   Anxiety disorder 09/09/2019   B12 deficiency    Cancer (HCC)    cervical cancer   Depression    Diverticulitis  Fatty liver    GERD (gastroesophageal reflux disease)    Hyperlipidemia    Hypertension    Insomnia 09/09/2019   Liver cyst     Vitamin D deficiency     PAST SURGICAL HISTORY: Past Surgical History:  Procedure Laterality Date   ABDOMINAL HYSTERECTOMY     COLON RESECTION     due to diverticulitis   SHOULDER ARTHROSCOPY WITH ROTATOR CUFF REPAIR Right 11/15/2022   Procedure: RIGHT SHOULDER ARTHROSCOPY WITH BICEPS TENODESIS;  Surgeon: Huel Cote, MD;  Location: [facility];  Service: Orthopedics;  Laterality: Right;    MEDICATIONS: Current Outpatient Medications on File Prior to Visit  Medication Sig Dispense Refill   amLODipine (NORVASC) 5 MG tablet Take 1 tablet (5 mg total) by mouth daily. 90 tablet 3   amoxicillin (AMOXIL) 500 MG capsule Take 1 capsule (500 mg total) by mouth 3 (three) times daily for 10 days. 30 capsule 0   aspirin EC 325 MG tablet Take 1 tablet (325 mg total) by mouth daily. 14 tablet 0   atorvastatin (LIPITOR) 40 MG tablet Take 1 tablet (40 mg total) by mouth daily. 90 tablet 3   azelastine (ASTELIN) 0.1 % nasal spray Place 2 sprays into both nostrils 2 (two) times daily. Use in each nostril as directed 30 mL 5   Blood Pressure KIT 1 each by Does not apply route once a week. 1 kit 0   buPROPion (WELLBUTRIN XL) 150 MG 24 hr tablet Take 1 tablet (150 mg total) by mouth daily. 90 tablet 1   celecoxib (CELEBREX) 100 MG capsule Take 1 capsule (100 mg total) by mouth 2 (two) times daily. 30 capsule 4   desloratadine (CLARINEX) 5 MG tablet Take 1 tablet (5 mg total) by mouth daily. 90 tablet 3   diclofenac Sodium (VOLTAREN) 1 % GEL Apply 4 g topically 4 (four) times daily as needed. 100 g 3   esomeprazole (NEXIUM) 40 MG capsule Take 1 capsule (40 mg total) by mouth daily. 90 capsule 1   fluticasone (FLONASE) 50 MCG/ACT nasal spray Place 2 sprays into both nostrils daily. 16 g 6   furosemide (LASIX) 20 MG tablet TAKE 1 TABLET TWICE A DAY 180 tablet 3   gabapentin (NEURONTIN) 300 MG capsule TAKE 2 CAPSULES(600MG       TOTAL) 3 TIMES A DAY 540 capsule 1   Hot/Cold Therapy Aids (HOT/COLD  PACK) PADS Use as directed 3 each 0   Humidifiers MISC Use as needed to prevent dry mouth 1 each 0   levocetirizine (XYZAL) 5 MG tablet Take 5 mg by mouth 2 (two) times daily as needed.     methocarbamol (ROBAXIN) 500 MG tablet Take 1 tablet (500 mg total) by mouth 4 (four) times daily. 30 tablet 3   pilocarpine (SALAGEN) 5 MG tablet Take 1 tablet (5 mg total) by mouth 2 (two) times daily. 60 tablet 1   potassium chloride SA (KLOR-CON M) 20 MEQ tablet Take 1 tablet (20 mEq total) by mouth 2 (two) times daily. 180 tablet 1   propranolol (INDERAL) 10 MG tablet Take 1 tablet (10 mg total) by mouth 2 (two) times daily as needed (anxiety). 60 tablet 1   sertraline (ZOLOFT) 50 MG tablet TAKE 1 AND 1/2 TABLETS AT  BEDTIME 135 tablet 3   tizanidine (ZANAFLEX) 2 MG capsule Take 1 capsule (2 mg total) by mouth 3 (three) times daily. 30 capsule 1   traZODone (DESYREL) 100 MG tablet Take 1 tablet (100 mg total)  by mouth at bedtime as needed for sleep. Please note, did larger dose so only 1 tab 90 tablet 3   No current facility-administered medications on file prior to visit.    ALLERGIES: Allergies  Allergen Reactions   Lisinopril Swelling   Losartan Swelling   Pseudoephedrine-Naproxen Na Er     Other Reaction(s): Other (See Comments)    FAMILY HISTORY: Family History  Problem Relation Age of Onset   Colon cancer Neg Hx    Esophageal cancer Neg Hx    Stomach cancer Neg Hx    Rectal cancer Neg Hx     Objective:  Blood pressure 125/68, pulse 75, height 5\' 1"  (1.549 m), weight 135 lb 3.2 oz (61.3 kg), SpO2 95%. General: No acute distress.  Patient appears well-groomed.   Head:  Normocephalic/atraumatic, right greater than left suboccipital tenderness Eyes:  fundi examined but not visualized Neck: supple, bilateral paraspinal tenderness, full range of motion Heart: regular rate and rhythm Neurological Exam: Mental status: alert and oriented to person, place, and time, speech fluent and not  dysarthric, language intact. Cranial nerves: CN I: not tested CN II: pupils equal, round and reactive to light, visual fields intact CN III, IV, VI:  full range of motion, no nystagmus, no ptosis CN V: facial sensation intact. CN VII: upper and lower face symmetric CN VIII: hearing intact CN IX, X: gag intact, uvula midline CN XI: sternocleidomastoid and trapezius muscles intact CN XII: tongue midline Bulk & Tone: normal, no fasciculations. Motor:  muscle strength 5/5 throughout Sensation:  Pinprick and vibratory sensation intact. Deep Tendon Reflexes:  2+ throughout,  toes downgoing.   Finger to nose testing:  Without dysmetria.   Gait:  Antalgic gait.  Ambulates with cane.  Romberg with sway.    Thank you for allowing me to take part in the care of this patient.  Shon Millet, DO  CC: An Marquette Old, MD

## 2023-05-30 ENCOUNTER — Ambulatory Visit (INDEPENDENT_AMBULATORY_CARE_PROVIDER_SITE_OTHER): Payer: Medicare HMO | Admitting: Neurology

## 2023-05-30 ENCOUNTER — Encounter: Payer: Self-pay | Admitting: Neurology

## 2023-05-30 VITALS — BP 125/68 | HR 75 | Ht 61.0 in | Wt 135.2 lb

## 2023-05-30 DIAGNOSIS — M4722 Other spondylosis with radiculopathy, cervical region: Secondary | ICD-10-CM

## 2023-05-30 DIAGNOSIS — G4486 Cervicogenic headache: Secondary | ICD-10-CM | POA: Diagnosis not present

## 2023-05-30 NOTE — Patient Instructions (Signed)
Agree with MRI of brain.  When you have it done, send me a phone call or mychart message letting me know that it was done.  Agree with sleep study as well  I think the headaches are related to your neck problems.  Recommend following up with spine specialist or interventional pain specialist.  However, there isn't much that I can offer you.

## 2023-05-31 NOTE — Addendum Note (Signed)
Addended by: Angelena Sole on: 05/31/2023 07:58 PM   Modules accepted: Orders

## 2023-06-01 ENCOUNTER — Encounter: Payer: Self-pay | Admitting: Family Medicine

## 2023-06-12 DIAGNOSIS — L400 Psoriasis vulgaris: Secondary | ICD-10-CM | POA: Diagnosis not present

## 2023-06-13 ENCOUNTER — Other Ambulatory Visit: Payer: Medicare HMO

## 2023-06-20 ENCOUNTER — Encounter: Payer: Self-pay | Admitting: Family Medicine

## 2023-06-20 ENCOUNTER — Ambulatory Visit: Payer: Medicare HMO | Admitting: Family Medicine

## 2023-06-20 VITALS — BP 101/68 | HR 65 | Temp 97.6°F | Resp 18 | Ht 61.0 in | Wt 133.5 lb

## 2023-06-20 DIAGNOSIS — R109 Unspecified abdominal pain: Secondary | ICD-10-CM

## 2023-06-20 DIAGNOSIS — N3090 Cystitis, unspecified without hematuria: Secondary | ICD-10-CM | POA: Diagnosis not present

## 2023-06-20 DIAGNOSIS — E538 Deficiency of other specified B group vitamins: Secondary | ICD-10-CM | POA: Diagnosis not present

## 2023-06-20 LAB — POC URINALSYSI DIPSTICK (AUTOMATED)
Bilirubin, UA: NEGATIVE
Blood, UA: NEGATIVE
Glucose, UA: NEGATIVE
Ketones, UA: NEGATIVE
Nitrite, UA: NEGATIVE
Protein, UA: NEGATIVE
Spec Grav, UA: 1.02 (ref 1.010–1.025)
Urobilinogen, UA: 0.2 U/dL
pH, UA: 5 (ref 5.0–8.0)

## 2023-06-20 MED ORDER — PHENAZOPYRIDINE HCL 100 MG PO TABS: 100.0000 mg | ORAL_TABLET | Freq: Three times a day (TID) | ORAL | 0 refills | Status: DC | PRN

## 2023-06-20 MED ORDER — CYANOCOBALAMIN 1000 MCG/ML IJ SOLN
1000.0000 ug | Freq: Once | INTRAMUSCULAR | Status: AC
  Administered 2023-06-20: 1000 ug via INTRAMUSCULAR

## 2023-06-20 MED ORDER — CEPHALEXIN 500 MG PO CAPS: 500.0000 mg | ORAL_CAPSULE | Freq: Two times a day (BID) | ORAL | 0 refills | Status: AC

## 2023-06-20 MED ORDER — CYANOCOBALAMIN 1000 MCG/ML IJ SOLN: 1000.0000 ug | Freq: Once | INTRAMUSCULAR | 0 refills | Status: DC

## 2023-06-20 NOTE — Patient Instructions (Signed)
It was very nice to see you today!  Happy Holidays  Worse, fever, etc. Let us know or ER   PLEASE NOTE:  If you had any lab tests please let us know if you have not heard back within a few days. You may see your results on MyChart before we have a chance to review them but we will give you a call once they are reviewed by [place]. If we ordered any referrals today, please let us know if you have not heard from their office within the next week.   Please try these tips to maintain a healthy lifestyle:  Eat most of your calories during the day when you are active. Eliminate processed foods including packaged sweets (pies, cakes, cookies), reduce intake of potatoes, white bread, white pasta, and white rice. Look for whole grain options, oat flour or almond flour.  Each meal should contain half fruits/vegetables, one quarter protein, and one quarter carbs (no bigger than a computer mouse).  Cut down on sweet beverages. This includes juice, soda, and sweet tea. Also watch fruit intake, though this is a healthier sweet option, it still contains natural sugar! Limit to 3 servings daily.  Drink at least 1 glass of water with each meal and aim for at least 8 glasses per day  Exercise at least 150 minutes every week.

## 2023-06-20 NOTE — Progress Notes (Signed)
Subjective:     Patient ID: Tracey Saunders, female    DOB: [birth year], 67 y.o.   MRN: [id number]  Chief Complaint  Patient presents with   Abdominal Pain    Abdominal pain that started today   Urinary Frequency    Started Friday, has been urinating on herself, sometimes it feel like she need to go and can't go as well    HPI Pt was having xport issues but urinary symptoms so accepted her late.  Incontinence, hesitancy, freq, can't control it. No f/c.  Lower abd pain. since 12/6.   Ride is here for pick up so needs to leave.    Health Maintenance Due  Topic Date Due   Hepatitis C Screening  Never done   Lung Cancer Screening  Never done    Past Medical History:  Diagnosis Date   Anxiety disorder 09/09/2019   B12 deficiency    Cancer (HCC)    cervical cancer   Depression    Diverticulitis    Fatty liver    GERD (gastroesophageal reflux disease)    Hyperlipidemia    Hypertension    Insomnia 09/09/2019   Liver cyst    Vitamin D deficiency     Past Surgical History:  Procedure Laterality Date   ABDOMINAL HYSTERECTOMY     COLON RESECTION     due to diverticulitis   SHOULDER ARTHROSCOPY WITH ROTATOR CUFF REPAIR Right 11/15/2022   Procedure: RIGHT SHOULDER ARTHROSCOPY WITH BICEPS TENODESIS;  Surgeon: Huel Cote, MD;  Location: [facility];  Service: Orthopedics;  Laterality: Right;     Current Outpatient Medications:    amLODipine (NORVASC) 5 MG tablet, Take 1 tablet (5 mg total) by mouth daily., Disp: 90 tablet, Rfl: 3   atorvastatin (LIPITOR) 40 MG tablet, Take 1 tablet (40 mg total) by mouth daily., Disp: 90 tablet, Rfl: 3   azelastine (ASTELIN) 0.1 % nasal spray, Place 2 sprays into both nostrils 2 (two) times daily. Use in each nostril as directed, Disp: 30 mL, Rfl: 5   Blood Pressure KIT, 1 each by Does not apply route once a week., Disp: 1 kit, Rfl: 0   buPROPion (WELLBUTRIN XL) 150 MG 24 hr tablet, Take 1 tablet (150 mg total) by mouth daily.,  Disp: 90 tablet, Rfl: 1   celecoxib (CELEBREX) 100 MG capsule, Take 1 capsule (100 mg total) by mouth 2 (two) times daily., Disp: 30 capsule, Rfl: 4   cephALEXin (KEFLEX) 500 MG capsule, Take 1 capsule (500 mg total) by mouth 2 (two) times daily for 7 days. Take for 7 days, Disp: 14 capsule, Rfl: 0   esomeprazole (NEXIUM) 40 MG capsule, Take 1 capsule (40 mg total) by mouth daily., Disp: 90 capsule, Rfl: 1   fluticasone (FLONASE) 50 MCG/ACT nasal spray, Place 2 sprays into both nostrils daily., Disp: 16 g, Rfl: 6   furosemide (LASIX) 20 MG tablet, TAKE 1 TABLET TWICE A DAY, Disp: 180 tablet, Rfl: 3   gabapentin (NEURONTIN) 300 MG capsule, TAKE 2 CAPSULES(600MG       TOTAL) 3 TIMES A DAY, Disp: 540 capsule, Rfl: 1   Hot/Cold Therapy Aids (HOT/COLD PACK) PADS, Use as directed, Disp: 3 each, Rfl: 0   Humidifiers MISC, Use as needed to prevent dry mouth, Disp: 1 each, Rfl: 0   levocetirizine (XYZAL) 5 MG tablet, Take 5 mg by mouth 2 (two) times daily as needed., Disp: , Rfl:    phenazopyridine (PYRIDIUM) 100 MG tablet, Take 1 tablet (100 mg  total) by mouth 3 (three) times daily as needed for pain., Disp: 6 tablet, Rfl: 0   pilocarpine (SALAGEN) 5 MG tablet, Take 1 tablet (5 mg total) by mouth 2 (two) times daily., Disp: 60 tablet, Rfl: 1   potassium chloride SA (KLOR-CON M) 20 MEQ tablet, Take 1 tablet (20 mEq total) by mouth 2 (two) times daily., Disp: 180 tablet, Rfl: 1   propranolol (INDERAL) 10 MG tablet, Take 1 tablet (10 mg total) by mouth 2 (two) times daily as needed (anxiety)., Disp: 60 tablet, Rfl: 1   sertraline (ZOLOFT) 50 MG tablet, TAKE 1 AND 1/2 TABLETS AT  BEDTIME, Disp: 135 tablet, Rfl: 3   traZODone (DESYREL) 100 MG tablet, Take 1 tablet (100 mg total) by mouth at bedtime as needed for sleep. Please note, did larger dose so only 1 tab, Disp: 90 tablet, Rfl: 3  Allergies  Allergen Reactions   Lisinopril Swelling   Losartan Swelling   Pseudoephedrine-Naproxen Na Er     Other  Reaction(s): Other (See Comments)   ROS neg/noncontributory except as noted HPI/below      Objective:     BP 101/68   Pulse 65   Temp 97.6 F (36.4 C) (Temporal)   Resp 18   Ht 5\' 1"  (1.549 m)   Wt 133 lb 8 oz (60.6 kg)   SpO2 94%   BMI 25.22 kg/m  Wt Readings from Last 3 Encounters:  06/20/23 133 lb 8 oz (60.6 kg)  05/30/23 135 lb 3.2 oz (61.3 kg)  05/23/23 136 lb 6 oz (61.9 kg)    Physical Exam   Gen: WDWN NAD HEENT: NCAT, conjunctiva not injected, sclera nonicteric ABDOMEN:  BS+, soft, +tenderness suprapubic, No HSM, no masses. +B CVAT vs just tender in general EXT:  no edema MSK: no gross abnormalities.  NEURO: A&O x3.  CN II-XII intact.  PSYCH: normal mood. Good eye contact Results for orders placed or performed in visit on 06/20/23  POCT Urinalysis Dipstick (Automated)  Result Value Ref Range   Color, UA yellow    Clarity, UA cloudy    Glucose, UA Negative Negative   Bilirubin, UA neg    Ketones, UA neg    Spec Grav, UA 1.020 1.010 - 1.025   Blood, UA neg    pH, UA 5.0 5.0 - 8.0   Protein, UA Negative Negative   Urobilinogen, UA 0.2 0.2 or 1.0 E.U./dL   Nitrite, UA neg    Leukocytes, UA Small (1+) (A) Negative    Eval rushed d/t pt ride getting here early    Assessment & Plan:  Cystitis -     Urine Culture  Abdominal pain, unspecified abdominal location -     POCT Urinalysis Dipstick (Automated)  Vitamin B12 deficiency -     Cyanocobalamin  Other orders -     Cephalexin; Take 1 capsule (500 mg total) by mouth 2 (two) times daily for 7 days. Take for 7 days  Dispense: 14 capsule; Refill: 0 -     Phenazopyridine HCl; Take 1 tablet (100 mg total) by mouth 3 (three) times daily as needed for pain.  Dispense: 6 tablet; Refill: 0   Cystitis-send cx. Keflex 500 bid, pyridium 100mg  tid prn.   B12 def-got monthly B12 injection  Return in about 4 weeks (around 07/18/2023) for HTN, B12.  Angelena Sole, MD

## 2023-06-21 ENCOUNTER — Telehealth: Payer: Self-pay | Admitting: Family Medicine

## 2023-06-21 NOTE — Telephone Encounter (Signed)
Pt would like a call back with urine results.

## 2023-06-21 NOTE — Telephone Encounter (Signed)
Will call patient when results are back, culture pending.

## 2023-06-22 ENCOUNTER — Encounter: Payer: Self-pay | Admitting: Family Medicine

## 2023-06-22 LAB — URINE CULTURE
MICRO NUMBER:: 15831877
SPECIMEN QUALITY:: ADEQUATE

## 2023-06-23 ENCOUNTER — Ambulatory Visit
Admission: RE | Admit: 2023-06-23 | Discharge: 2023-06-23 | Disposition: A | Discharge status: Home / Self Care | Payer: Medicare HMO | Source: Ambulatory Visit | Attending: Family Medicine | Admitting: Family Medicine

## 2023-06-23 DIAGNOSIS — G44221 Chronic tension-type headache, intractable: Secondary | ICD-10-CM

## 2023-06-23 DIAGNOSIS — G44201 Tension-type headache, unspecified, intractable: Secondary | ICD-10-CM | POA: Diagnosis not present

## 2023-06-24 ENCOUNTER — Encounter: Payer: Self-pay | Admitting: Family Medicine

## 2023-06-30 ENCOUNTER — Ambulatory Visit (INDEPENDENT_AMBULATORY_CARE_PROVIDER_SITE_OTHER): Payer: Medicare HMO | Admitting: Otolaryngology

## 2023-07-17 ENCOUNTER — Encounter: Payer: Self-pay | Admitting: Neurology

## 2023-07-17 ENCOUNTER — Ambulatory Visit (INDEPENDENT_AMBULATORY_CARE_PROVIDER_SITE_OTHER): Payer: Medicare HMO | Admitting: Neurology

## 2023-07-17 VITALS — BP 116/68 | HR 68 | Ht 61.0 in | Wt 138.0 lb

## 2023-07-17 DIAGNOSIS — Z8669 Personal history of other diseases of the nervous system and sense organs: Secondary | ICD-10-CM | POA: Diagnosis not present

## 2023-07-17 DIAGNOSIS — R351 Nocturia: Secondary | ICD-10-CM

## 2023-07-17 DIAGNOSIS — R519 Headache, unspecified: Secondary | ICD-10-CM

## 2023-07-17 DIAGNOSIS — G4700 Insomnia, unspecified: Secondary | ICD-10-CM | POA: Diagnosis not present

## 2023-07-17 DIAGNOSIS — E663 Overweight: Secondary | ICD-10-CM

## 2023-07-17 NOTE — Patient Instructions (Addendum)
 Thank you for choosing [facility] for your sleep related care! It was nice to meet you today!   Here is what we discussed today:    Based on your symptoms and your exam I believe you are at risk for obstructive sleep apnea (aka OSA). We should proceed with a sleep study to determine whether you do or do not have OSA and how severe it is. Even, if you have mild OSA, I may want you to consider treatment with CPAP, as treatment of even borderline or mild sleep apnea can result and improvement of symptoms such as sleep disruption, daytime sleepiness, nighttime bathroom breaks, restless leg symptoms, improvement of headache syndromes, even improved mood disorder.   As explained, an attended sleep study (meaning you get to stay overnight in the sleep lab), lets us  monitor sleep-related behaviors such as sleep talking and leg movements in sleep, in addition to monitoring for sleep apnea.  A home sleep test is a screening tool for sleep apnea diagnosis only, but unfortunately, does not help with any other sleep-related diagnoses.  Please remember, the long-term risks and ramifications of untreated moderate to severe obstructive sleep apnea may include (but are not limited to): increased risk for cardiovascular disease, including congestive heart failure, stroke, difficult to control hypertension, treatment resistant obesity, arrhythmias, especially irregular heartbeat commonly known as A. Fib. (atrial fibrillation); even type 2 diabetes has been linked to untreated OSA.   Other correlations that untreated obstructive sleep apnea include macular edema which is swelling of the retina in the eyes, droopy eyelid syndrome, and elevated hemoglobin and hematocrit levels (often referred to as polycythemia).  Sleep apnea can cause disruption of sleep and sleep deprivation in most cases, which, in turn, can cause recurrent headaches, problems with memory, mood, concentration, focus, and vigilance. Most  people with untreated sleep apnea report excessive daytime sleepiness, which can affect their ability to drive. Please do not drive or use heavy equipment or machinery, if you feel sleepy! Patients with sleep apnea can also develop difficulty initiating and maintaining sleep (aka insomnia).   Having sleep apnea may increase your risk for other sleep disorders, including involuntary behaviors sleep such as sleep terrors, sleep talking, sleepwalking.    Having sleep apnea can also increase your risk for restless leg syndrome and leg movements at night.   Please note that untreated obstructive sleep apnea may carry additional perioperative morbidity. Patients with significant obstructive sleep apnea (typically, in the moderate to severe degree) should receive, if possible, perioperative PAP (positive airway pressure) therapy and the surgeons and particularly the anesthesiologists should be informed of the diagnosis and the severity of the sleep disordered breathing.

## 2023-07-17 NOTE — Progress Notes (Signed)
 Subjective:    Patient ID: Tracey Saunders is a 68 y.o. female.  HPI    True Mar, MD, PhD [facility] [address] [address] [place], [place] [address]  Dear Dr. Wendolyn,  I saw your patient, Tracey Saunders, upon your kind request in my sleep clinic today for initial consultation of her sleep disorder, in particular, concern for underlying obstructive sleep apnea.  The patient is unaccompanied today.  As you know, Tracey Saunders is a 68 year old female with an underlying medical history of hypertension, headaches (followed by Dr. Skeet at [facility] neurolog[facility]), vitamin B12 deficiency, vitamin D  deficiency, depression, hyperlipidemia, and mildly overweight state, who reports daytime tiredness.  Her Epworth sleepiness score is 10 out of 24, fatigue severity score is 43 out of 63.  I reviewed your office note from 05/23/2023.  I also reviewed her office encounter note with Dr. Skeet from 05/30/2023.  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  She was encouraged to seek evaluation through a spine specialist or interventional pain specialist. She reports chronic difficulty initiating and maintaining sleep.  Of note, she takes 100 mg of trazodone  at bedtime and also takes the sertraline  at bedtime which is currently 75 mg.  She also takes quite a high dose of gabapentin , 600 mg 3 times daily currently.  Reports morning headaches and sleep disruption, she has nocturia 2-3 times per average night.  She drinks quite a bit of caffeine in the form of sweet tea, 8 or 9 glasses/day 2 cups of coffee in the morning.  She smokes about 3 cigarettes/day and drinks alcohol occasionally, maybe once a month.  She is not aware of any family history of sleep apnea.  She reports that she had a sleep study 35 years ago and was diagnosed with sleep apnea, she tried a CPAP machine at the time but gained it back as she could not tolerate treatment.  Bedtime is typically before 10 PM and typical rise  time is between 8 AM.  She lives alone, she has 1 dog in the household.  She has a TV in her bedroom and it can be on at night, sometimes she falls asleep with the TV on, she also listens to music at night.  Her Past Medical History Is Significant For: Past Medical History:  Diagnosis Date   Anxiety disorder 09/09/2019   B12 deficiency    Cancer (HCC)    cervical cancer   Depression    Diverticulitis    Fatty liver    GERD (gastroesophageal reflux disease)    Hyperlipidemia    Hypertension    Insomnia 09/09/2019   Liver cyst    Vitamin D  deficiency     Her Past Surgical History Is Significant For: Past Surgical History:  Procedure Laterality Date   ABDOMINAL HYSTERECTOMY     COLON RESECTION     due to diverticulitis   SHOULDER ARTHROSCOPY WITH ROTATOR CUFF REPAIR Right 11/15/2022   Procedure: RIGHT SHOULDER ARTHROSCOPY WITH BICEPS TENODESIS;  Surgeon: Genelle Standing, MD;  Location: [facility];  Service: Orthopedics;  Laterality: Right;    Her Family History Is Significant For: Family History  Problem Relation Age of Onset   Stroke Mother    Colon cancer Neg Hx    Esophageal cancer Neg Hx    Stomach cancer Neg Hx    Rectal cancer Neg Hx    Sleep apnea Neg Hx     Her Social History Is Significant For: Social History  Socioeconomic History   Marital status: Single    Spouse name: Not on file   Number of children: 1   Years of education: Not on file   Highest education level: Not on file  Occupational History   Occupation: retired   Occupation: retired  Tobacco Use   Smoking status: Light Smoker    Current packs/day: 0.50    Types: Cigarettes   Smokeless tobacco: Never   Tobacco comments:    Smokes 3 cigarettes a day   Vaping Use   Vaping status: Never Used  Substance and Sexual Activity   Alcohol use: Not Currently    Comment: occasional beer or bloody mary once a week   Drug use: Never   Sexual activity: Not on file  Other Topics Concern    Not on file  Social History Narrative   Are you right handed or left handed? Right    Are you currently employed ? Retired   What is your current occupation?   Do you live at home alone? Y   Who lives with you?    What type of home do you live in: 1 story or 2 story?        Social Drivers of Corporate Investment Banker Strain: Low Risk  (01/24/2023)   Overall Financial Resource Strain (CARDIA)    Difficulty of Paying Living Expenses: Not hard at all  Food Insecurity: No Food Insecurity (01/24/2023)   Hunger Vital Sign    Worried About Running Out of Food in the Last Year: Never true    Ran Out of Food in the Last Year: Never true  Transportation Needs: No Transportation Needs (01/24/2023)   PRAPARE - Administrator, Civil Service (Medical): No    Lack of Transportation (Non-Medical): No  Physical Activity: Sufficiently Active (01/24/2023)   Exercise Vital Sign    Days of Exercise per Week: 5 days    Minutes of Exercise per Session: 30 min  Stress: No Stress Concern Present (01/24/2023)   [employer] of Occupational Health - Occupational Stress Questionnaire    Feeling of Stress : Not at all  Social Connections: Socially Isolated (01/24/2023)   Social Connection and Isolation Panel [NHANES]    Frequency of Communication with Friends and Family: More than three times a week    Frequency of Social Gatherings with Friends and Family: Once a week    Attends Religious Services: Never    Database Administrator or Organizations: No    Attends Banker Meetings: Never    Marital Status: Never married    Her Allergies Are:  Allergies  Allergen Reactions   Lisinopril  Swelling   Losartan  Swelling   Pseudoephedrine-Naproxen Na Er     Other Reaction(s): Other (See Comments)  :   Her Current Medications Are:  Outpatient Encounter Medications as of 07/17/2023  Medication Sig   amLODipine  (NORVASC ) 5 MG tablet Take 1 tablet (5 mg total) by mouth daily.    atorvastatin  (LIPITOR) 40 MG tablet Take 1 tablet (40 mg total) by mouth daily.   azelastine  (ASTELIN ) 0.1 % nasal spray Place 2 sprays into both nostrils 2 (two) times daily. Use in each nostril as directed   Blood Pressure KIT 1 each by Does not apply route once a week.   buPROPion  (WELLBUTRIN  XL) 150 MG 24 hr tablet Take 1 tablet (150 mg total) by mouth daily.   celecoxib  (CELEBREX ) 100 MG capsule Take 1 capsule (100 mg total) by  mouth 2 (two) times daily.   esomeprazole  (NEXIUM ) 40 MG capsule Take 1 capsule (40 mg total) by mouth daily.   fluticasone  (FLONASE ) 50 MCG/ACT nasal spray Place 2 sprays into both nostrils daily.   furosemide  (LASIX ) 20 MG tablet TAKE 1 TABLET TWICE A DAY   gabapentin  (NEURONTIN ) 300 MG capsule TAKE 2 CAPSULES(600MG       TOTAL) 3 TIMES A DAY   Hot/Cold Therapy Aids (HOT/COLD PACK) PADS Use as directed   Humidifiers MISC Use as needed to prevent dry mouth   levocetirizine (XYZAL ) 5 MG tablet Take 5 mg by mouth 2 (two) times daily as needed.   phenazopyridine  (PYRIDIUM ) 100 MG tablet Take 1 tablet (100 mg total) by mouth 3 (three) times daily as needed for pain.   pilocarpine  (SALAGEN ) 5 MG tablet Take 1 tablet (5 mg total) by mouth 2 (two) times daily.   potassium chloride  SA (KLOR-CON  M) 20 MEQ tablet Take 1 tablet (20 mEq total) by mouth 2 (two) times daily.   propranolol  (INDERAL ) 10 MG tablet Take 1 tablet (10 mg total) by mouth 2 (two) times daily as needed (anxiety).   sertraline  (ZOLOFT ) 50 MG tablet TAKE 1 AND 1/2 TABLETS AT  BEDTIME   traZODone  (DESYREL ) 100 MG tablet Take 1 tablet (100 mg total) by mouth at bedtime as needed for sleep. Please note, did larger dose so only 1 tab   No facility-administered encounter medications on file as of 07/17/2023.  :   Review of Systems:  Out of a complete 14 point review of systems, all are reviewed and negative with the exception of these symptoms as listed below:  Review of Systems  Neurological:        Pt is  here for sleep consult. Pt reports daily headaches, hypertension and daytime fatigue. ESS 10, FSS 43    Objective:  Neurological Exam  Physical Exam Physical Examination:   Vitals:   07/17/23 1237  BP: 116/68  Pulse: 68    General Examination: The patient is a very pleasant 67 y.o. female in no acute distress. She appears well-developed and well-nourished and well groomed.   HEENT: Normocephalic, atraumatic, pupils are equal, round and reactive to light, extraocular tracking is good without limitation to gaze excursion or nystagmus noted. Hearing is grossly intact. Face is symmetric with normal facial animation. Speech is clear with no dysarthria noted. There is no hypophonia. There is no lip, neck/head, jaw or voice tremor. Neck is supple with full range of passive and active motion. There are no carotid bruits on auscultation. Oropharynx exam reveals: moderate mouth dryness, she is edentulous.  She reports that she has dentures at home.  She has mild airway crowding secondary to redundant soft palate and small airway entry.  Mallampati class III.  Tongue protrudes centrally and palate elevates symmetrically.    Chest: Clear to auscultation without wheezing, rhonchi or crackles noted.  Heart: S1+S2+0, regular and normal without murmurs, rubs or gallops noted.   Abdomen: Soft, non-tender and non-distended.  Extremities: There is no pitting edema in the distal lower extremities bilaterally.   Skin: Warm and dry without trophic changes noted.   Musculoskeletal: exam reveals limited range of motion right shoulder.    Neurologically:  Mental status: The patient is awake, alert and oriented in all 4 spheres. Her immediate and remote memory, attention, language skills and fund of knowledge are appropriate. There is no evidence of aphasia, agnosia, apraxia or anomia. Speech is clear with normal prosody and enunciation.  Thought process is linear. Mood is normal and affect is normal.  Cranial  nerves II - XII are as described above under HEENT exam.  Motor exam: Thin bulk, global strength of about 4 out of 5, limited range of motion right upper extremity, she walks with a 4 pronged cane.  There is no obvious action or resting tremor.  Fine motor skills and coordination: grossly intact.  Cerebellar testing: No dysmetria or intention tremor. There is no truncal or gait ataxia.  Sensory exam: intact to light touch in the upper and lower extremities.  Gait, station and balance: She stands easily. No veering to one side is noted. No leaning to one side is noted. Posture is age-appropriate and stance is narrow based.  She walks with a cane.  Assessment and Plan:   In summary, Tracey Saunders is a very pleasant 68 y.o.-year old female with an underlying medical history of hypertension, headaches (followed by Dr. Skeet at [facility] neurology), vitamin B12 deficiency, vitamin D  deficiency, depression, hyperlipidemia, and mildly overweight state, who presents for evaluation of her sleep disturbance.  She has a prior diagnosis of obstructive sleep apnea from many years ago but has not been on PAP therapy for decades.  She has chronic difficulty initiating and maintaining sleep.   I had a long chat with the patient about my findings and the diagnosis of sleep apnea, particularly OSA, its prognosis and treatment options. We talked about medical/conservative treatments, surgical interventions and non-pharmacological approaches for symptom control. I explained, in particular, the risks and ramifications of untreated moderate to severe OSA, especially with respect to developing cardiovascular disease down the road, including congestive heart failure (CHF), difficult to treat hypertension, cardiac arrhythmias (particularly A-fib), neurovascular complications including TIA, stroke and dementia. Even type 2 diabetes has, in part, been linked to untreated OSA. Symptoms of untreated OSA may include (but may not be  limited to) daytime sleepiness, nocturia (i.e. frequent nighttime urination), memory problems, mood irritability and suboptimally controlled or worsening mood disorder such as depression and/or anxiety, lack of energy, lack of motivation, physical discomfort, as well as recurrent headaches, especially morning or nocturnal headaches. We talked about the importance of maintaining a healthy lifestyle and striving for healthy weight.  The importance of complete smoking cessation was also addressed.  In addition, we talked about the importance of striving for and maintaining good sleep hygiene.  She is encouraged to scale back on her caffeine intake as this may be a contributor to her chronic difficulty initiating and maintaining sleep. I recommended a sleep study at this time. I outlined the differences between a laboratory attended sleep study which is considered more comprehensive and accurate over the option of a home sleep test (HST); the latter may lead to underestimation of sleep disordered breathing in some instances and does not help with diagnosing upper airway resistance syndrome and is not accurate enough to diagnose primary central sleep apnea typically.  She would prefer a home sleep test but would like to think about it first.  She reports that she has difficulty with transportation. I outlined possible surgical and non-surgical treatment options of OSA, including the use of a positive airway pressure (PAP) device (i.e. CPAP, AutoPAP/APAP or BiPAP in certain circumstances), a custom-made dental device (aka oral appliance, which would require a referral to a specialist dentist or orthodontist typically, and is generally speaking not considered for patients with full dentures or edentulous state), upper airway surgical options, such as traditional UPPP (which is not considered  a first-line treatment) or the Inspire device (hypoglossal nerve stimulator, which would involve a referral for consultation with  an ENT surgeon, after careful selection, following inclusion criteria - also not first-line treatment). I explained the PAP treatment option to the patient in detail, as this is generally considered first-line treatment.  The patient indicated that she is not sure if she would be willing to try PAP therapy again.  She is encouraged to call us  back or email us  through MyChart once she is agreeable to pursuing testing at home. I answered all her questions today and she was in agreement.  Thank you very much for allowing me to participate in the care of this nice patient. If I can be of any further assistance to you please do not hesitate to call me at [phone].  Sincerely,   True Mar, MD, PhD

## 2023-07-21 ENCOUNTER — Other Ambulatory Visit: Payer: Self-pay | Admitting: Family Medicine

## 2023-07-21 DIAGNOSIS — G44221 Chronic tension-type headache, intractable: Secondary | ICD-10-CM

## 2023-07-24 IMAGING — DX DG CERVICAL SPINE COMPLETE 4+V
5 series · 5 of 5 positions shown · non-contrast
Comparison: None.

CLINICAL DATA: Chronic neck pain, with recent worsening following a
fall 2 months ago. Severe headaches as well.

EXAM:
CERVICAL SPINE - COMPLETE 4+ VIEW

[c-spine lat]
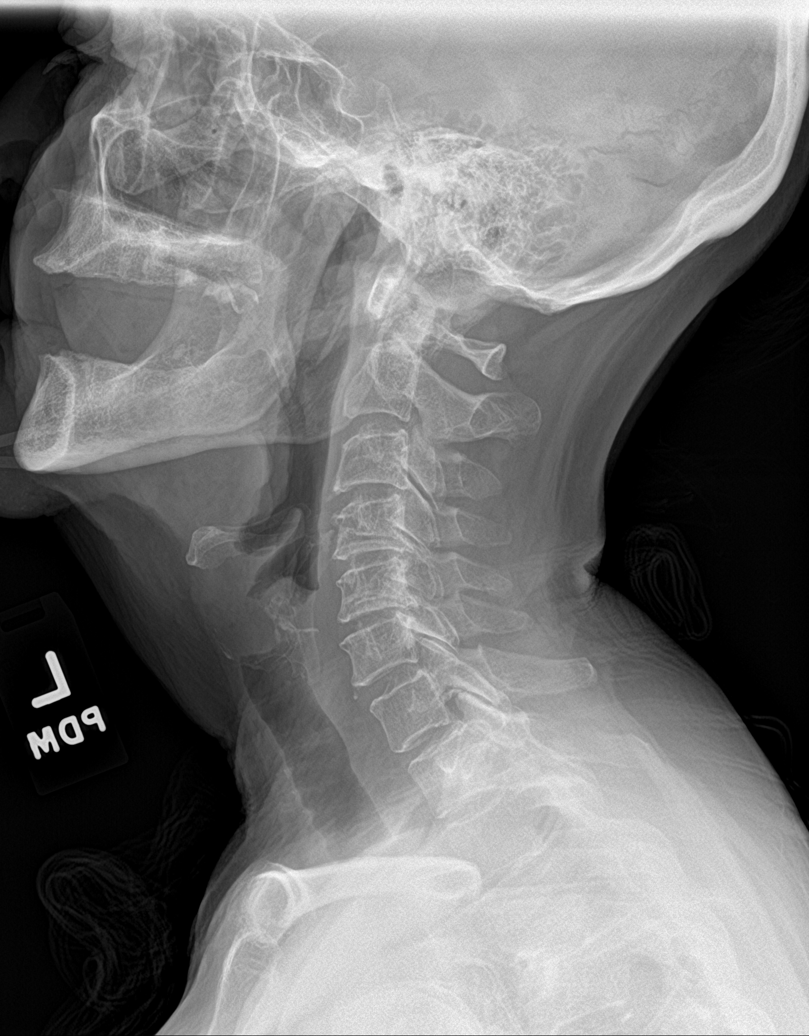

[c-spine obl (1 of 2)]
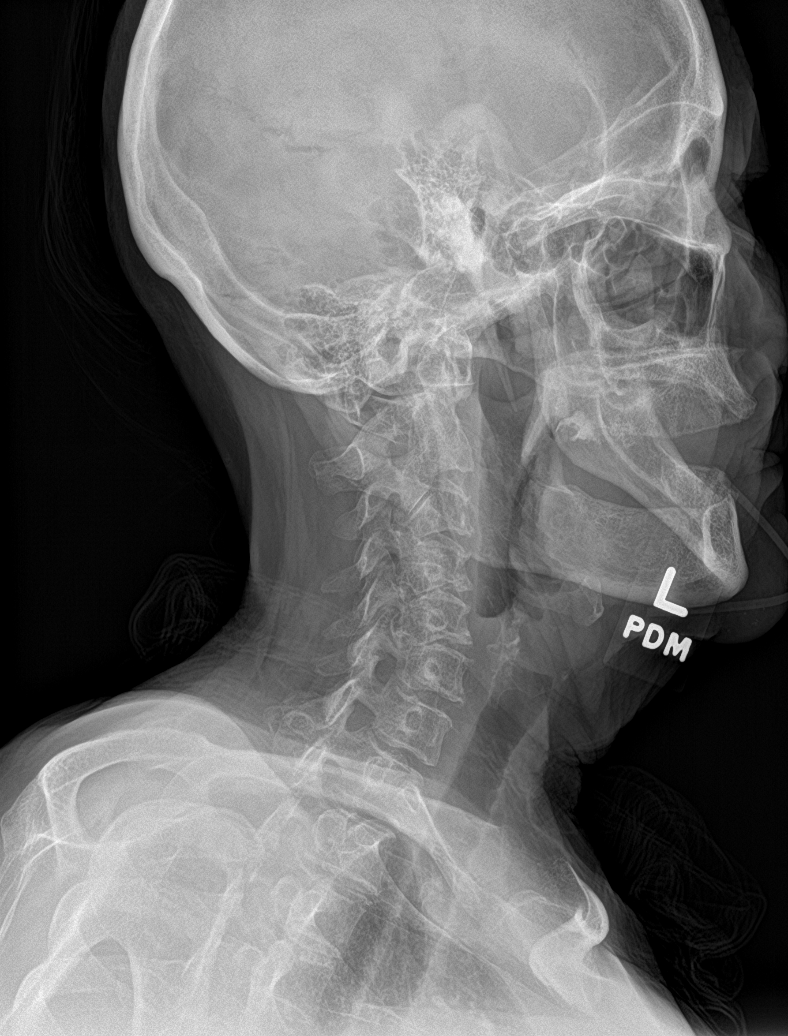

[c-spine obl (2 of 2)]
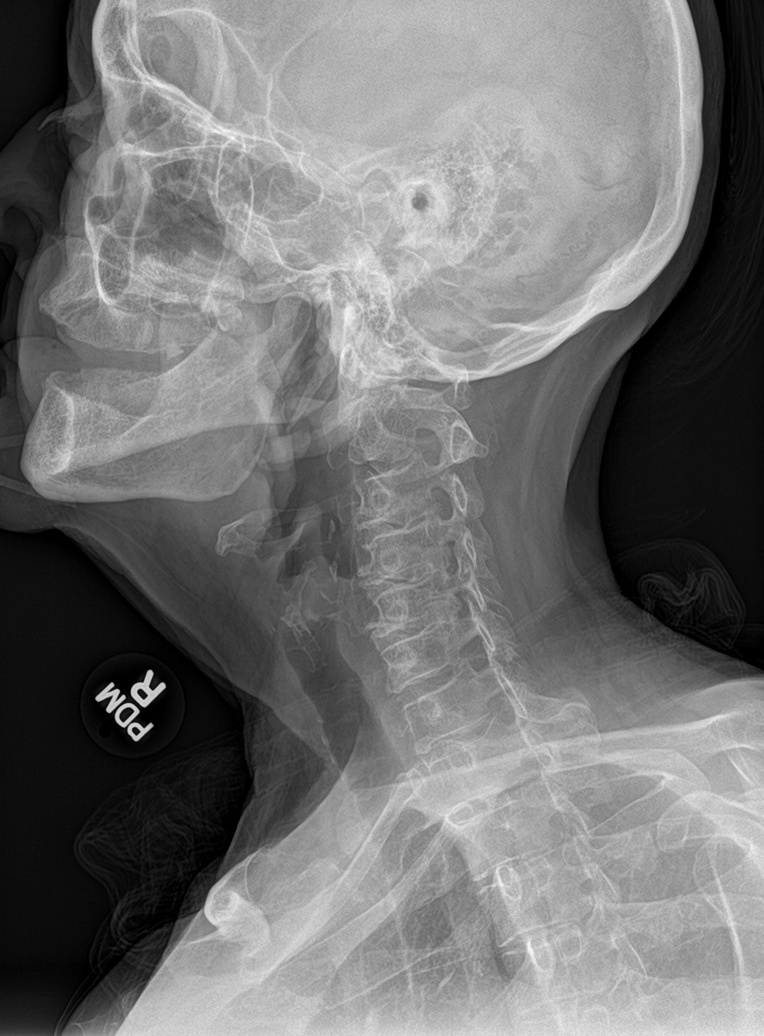

[c-spine ap]
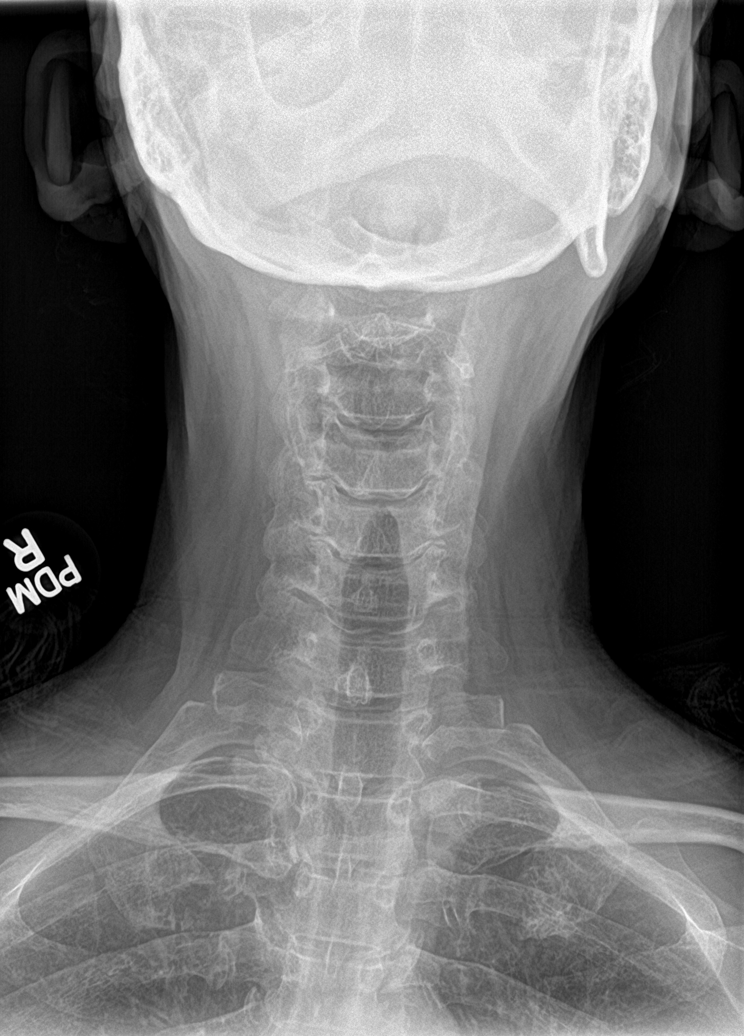

[c-spine open mouth]
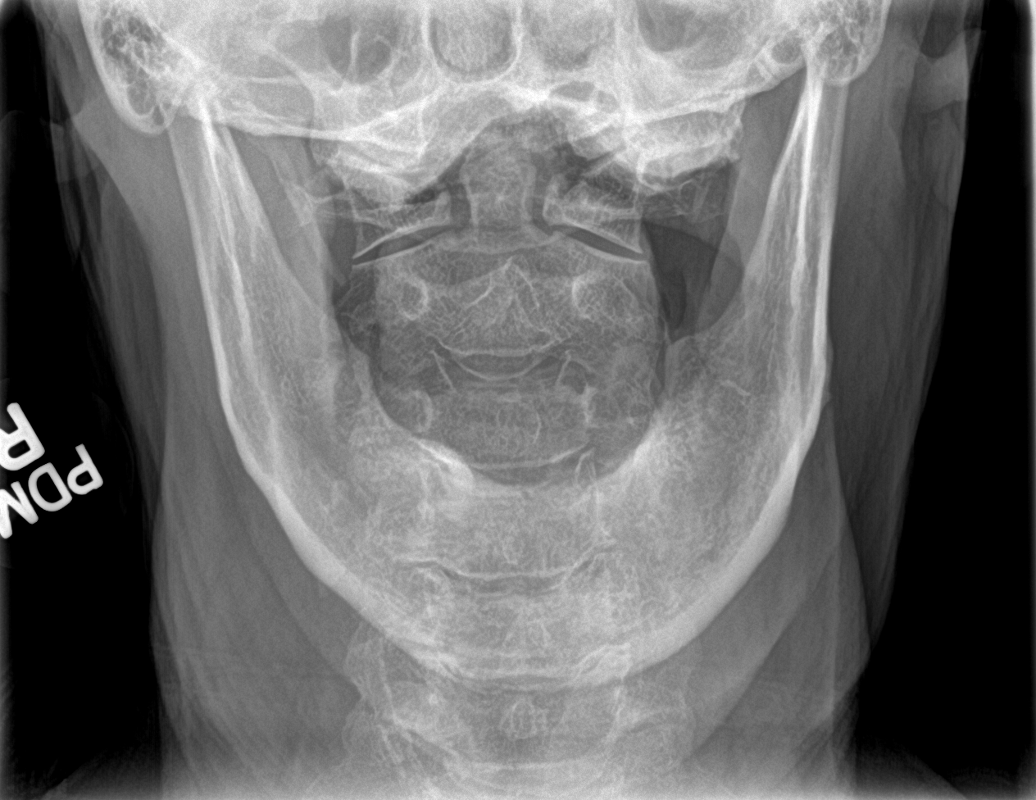

[5 of 5 positions shown; findings below may reference images not displayed]

FINDINGS: Osteopenia. Alignment is within normal limits. There is preservation
of the normal vertebral body heights.

There is no evidence of fractures or listhesis. There is no
precervical soft tissue thickening.

There is mild-to-moderate disc space loss at C4-5 and C5-6. Other
cervical discs are normal in heights.

Uncinate and facet joint hypertrophy is seen with foraminal stenosis
which is moderate on the right at C3-4, moderate to severe on the
right greater than left at C4-5 and C5-6.

Other foramina appear clear. The patient is edentulous. Visualized
lung apices are clear.
IMPRESSION: Osteopenia and degenerative change without evidence of fractures or
malalignment. See above for full details. Obtain CT if there is
concern for occult fracture.

## 2023-07-24 IMAGING — DX DG LUMBAR SPINE 2-3V
3 series · 3 of 3 positions shown · non-contrast
Comparison: X-ray lumbar spine 06/25/2020

CLINICAL DATA: LBP w/sciatica R.  Fall

EXAM:
LUMBAR SPINE - 2-3 VIEW

[l-spine ap]
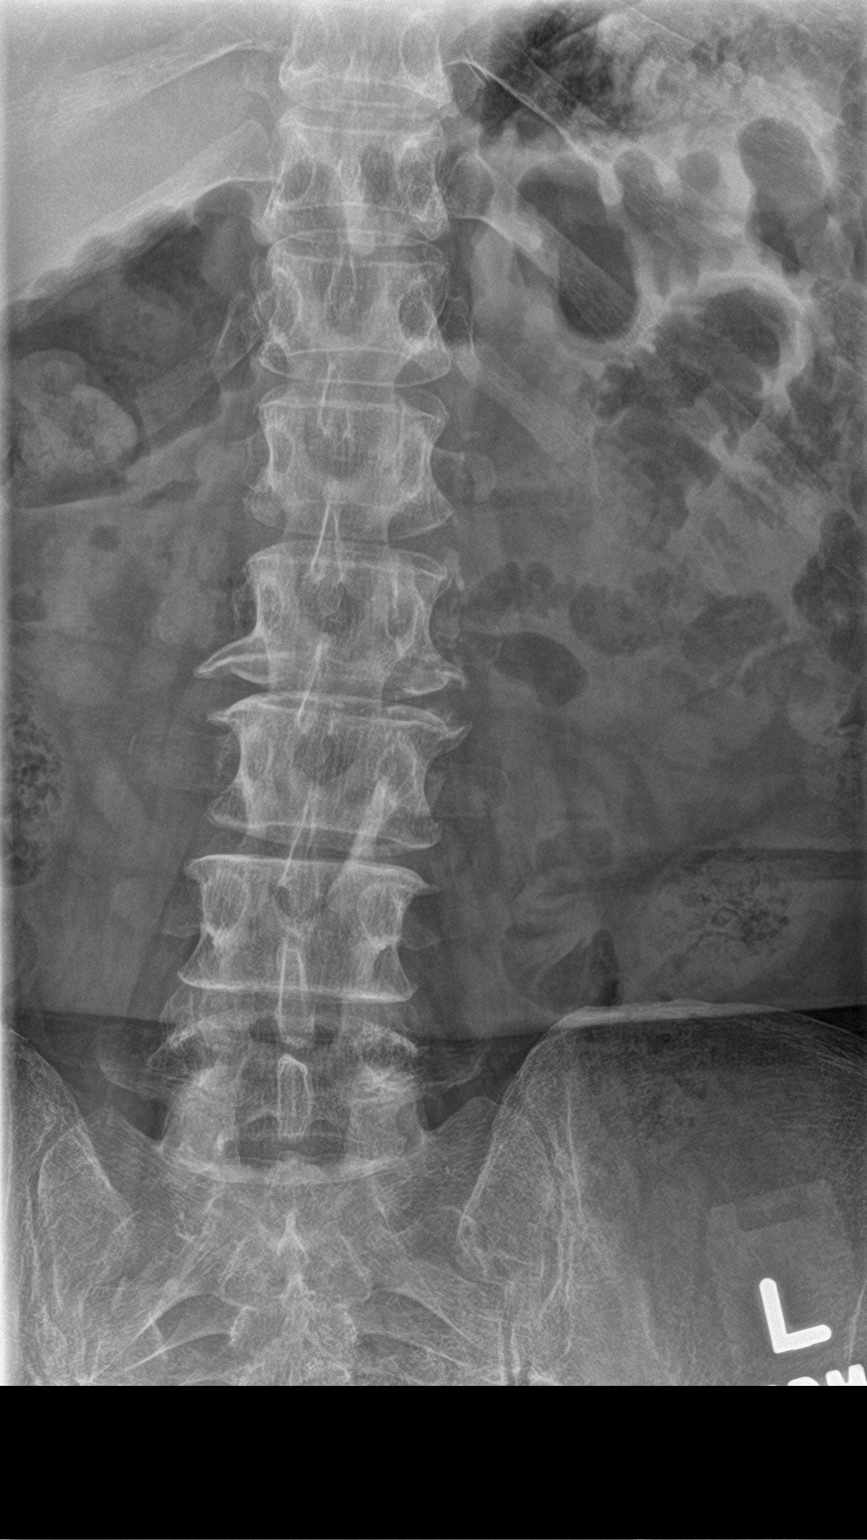

[l-spine lat]
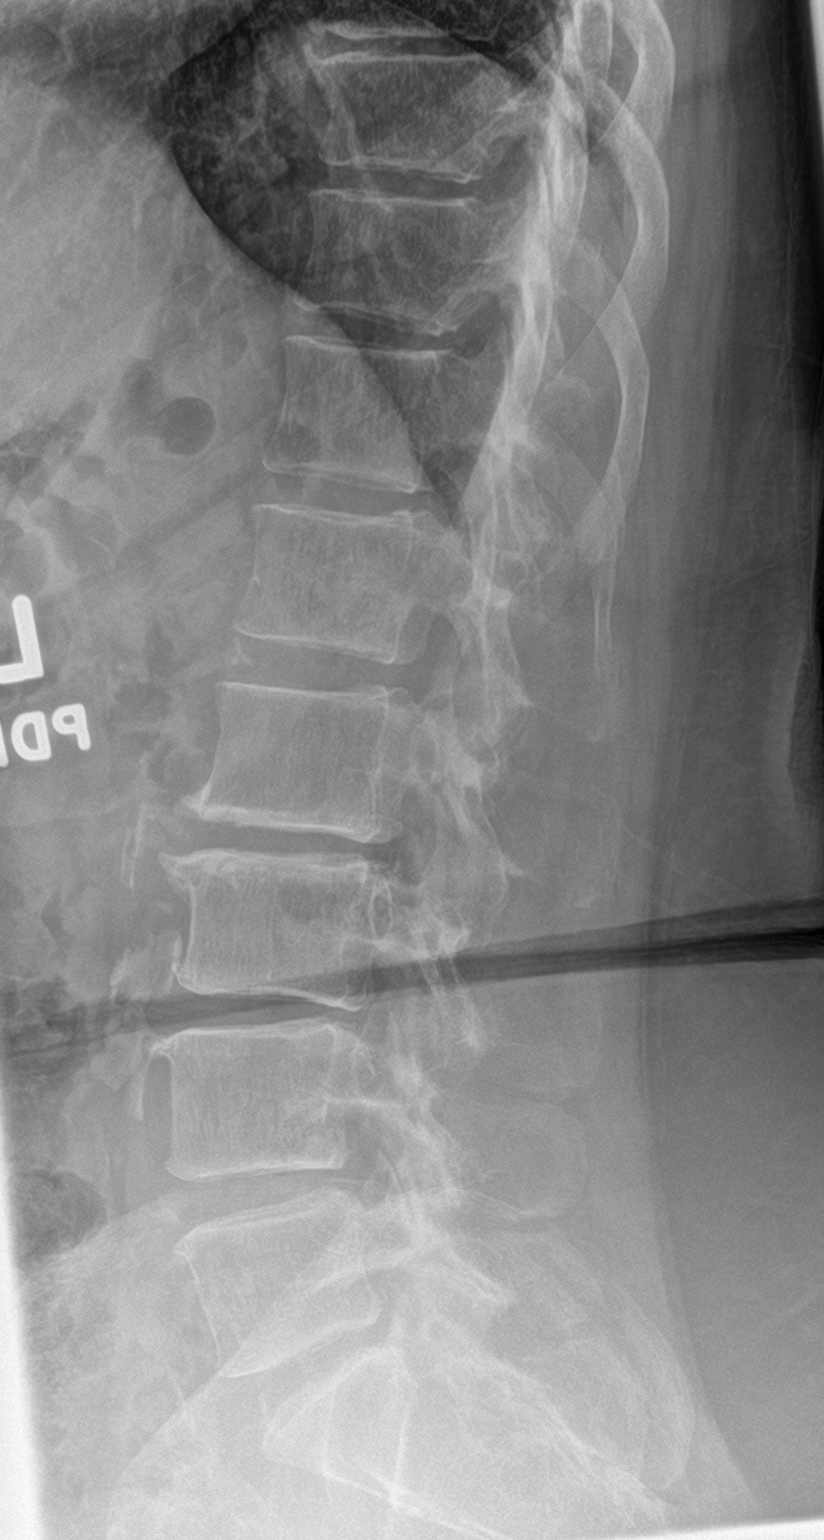

[l-spine spot]
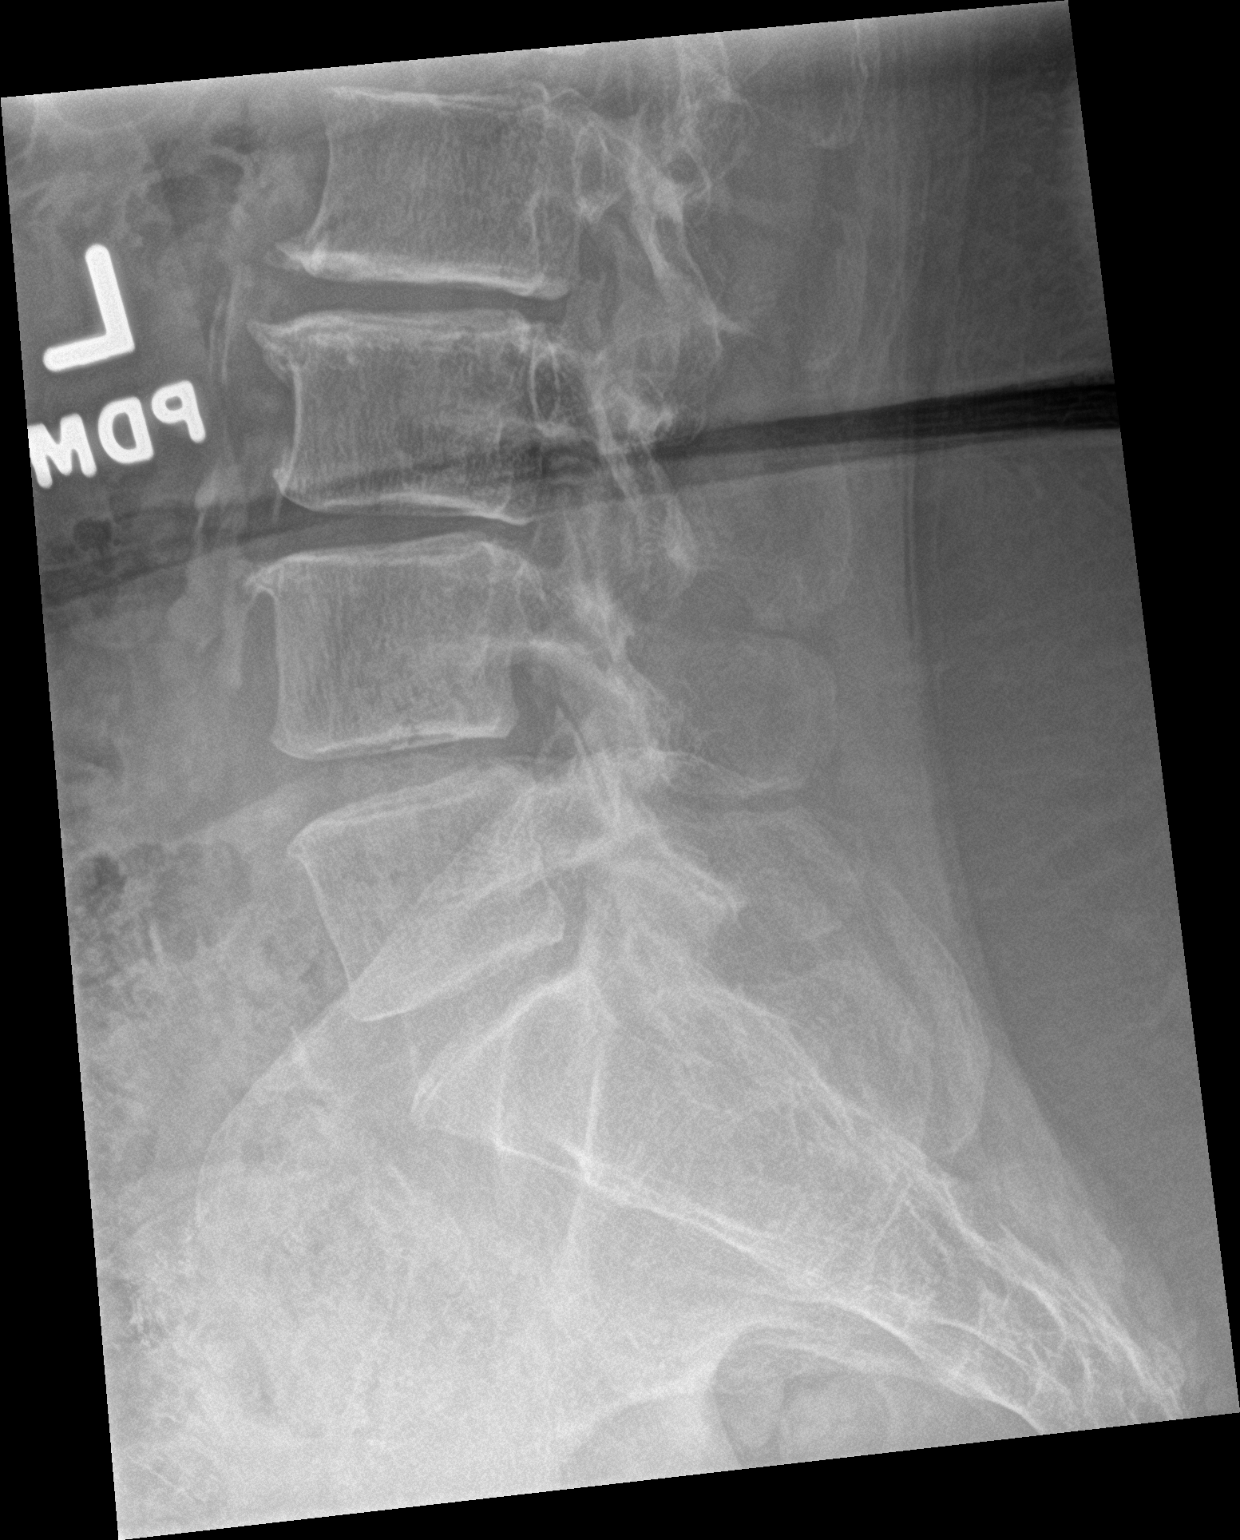

[3 of 3 positions shown; findings below may reference images not displayed]

FINDINGS: Five non-rib-bearing lumbar vertebral bodies. Multilevel
mild-to-moderate degenerative changes of the spine. Some level of
osseous neural foraminal stenosis- Limited evaluation due to
overlapping osseous structures and overlying soft tissues. there is
no evidence of lumbar spine fracture. Alignment is normal.
Intervertebral disc spaces are maintained. Atherosclerotic plaque of
the aorta
IMPRESSION: 1. No acute displaced fracture or traumatic listhesis of the lumbar
spine. Limited evaluation due to overlapping osseous structures and
overlying soft tissues.
2.  Aortic Atherosclerosis ([id number]).

## 2023-07-26 ENCOUNTER — Ambulatory Visit: Payer: Medicare HMO | Admitting: Physician Assistant

## 2023-07-29 ENCOUNTER — Other Ambulatory Visit: Payer: Self-pay | Admitting: Family Medicine

## 2023-08-07 ENCOUNTER — Telehealth: Payer: Self-pay | Admitting: Family Medicine

## 2023-08-07 NOTE — Telephone Encounter (Signed)
Patient stated she wanted to refill some medications, but wasn't sure if they had expired. Patient advised to call pharmacy first and request refills. Patient verbalized understanding.

## 2023-08-07 NOTE — Telephone Encounter (Signed)
Copied from CRM [id number]. Topic: General - Other >> Aug 07, 2023 11:51 AM Tracey Saunders wrote: Reason for CRM: Patient is wanting to know if she is up to date on all of her refills. She is not sure if some have expired or not.

## 2023-08-29 DIAGNOSIS — H43393 Other vitreous opacities, bilateral: Secondary | ICD-10-CM | POA: Diagnosis not present

## 2023-08-29 DIAGNOSIS — Z0100 Encounter for examination of eyes and vision without abnormal findings: Secondary | ICD-10-CM | POA: Diagnosis not present

## 2023-09-14 ENCOUNTER — Ambulatory Visit: Admitting: Family Medicine

## 2023-09-15 ENCOUNTER — Ambulatory Visit: Admitting: Family Medicine

## 2023-09-15 ENCOUNTER — Encounter: Payer: Self-pay | Admitting: Family Medicine

## 2023-09-15 VITALS — BP 130/80 | HR 66 | Temp 97.4°F | Ht 61.0 in | Wt 137.0 lb

## 2023-09-15 DIAGNOSIS — M7052 Other bursitis of knee, left knee: Secondary | ICD-10-CM | POA: Diagnosis not present

## 2023-09-15 DIAGNOSIS — I10 Essential (primary) hypertension: Secondary | ICD-10-CM | POA: Diagnosis not present

## 2023-09-15 MED ORDER — MELOXICAM 7.5 MG PO TABS: 7.5000 mg | ORAL_TABLET | Freq: Every day | ORAL | 0 refills | Status: DC

## 2023-09-15 NOTE — Patient Instructions (Addendum)
 infrapatellar bursitis- we are going to try to calm this down with meloxicam (she will monitor blood pressure and stop if blood pressure regularly over 145/90). We discussed gastrointestinal bleeding risk  in addition to mild cardiac risks and risks to blood pressure which as noted she will monitor - we discussed if fails to improve in 10-14 days to let me know and I can refer to sports medicine  -relative rest advised -reports off Celebrex- so we will trial meloxicam- knows not to take Celebrex or ibuprofen.aleve/motrin  Recommended follow up: Return for as needed for new, worsening, persistent symptoms. Also schedule 1 month follow up with Dr. Ruthine Dose regardless for regular visit

## 2023-09-15 NOTE — Progress Notes (Signed)
 Phone [phone] In person visit   Subjective:   Tracey Saunders is a 68 y.o. year old very pleasant female patient who presents for/with See problem oriented charting Chief Complaint  Patient presents with   Leg Pain    Pt c/o left leg pain after falling 3 times.    Past Medical History-  Patient Active Problem List   Diagnosis Date Noted   Chronic tension-type headache, intractable 05/25/2023   Subacromial impingement of right shoulder 11/15/2022   Biceps tendinitis of right shoulder 11/15/2022   Asthmatic bronchitis , chronic (HCC) 12/15/2021   Chronic right-sided low back pain with sciatica 06/22/2020   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   Headache, drug induced 03/12/2020   Hair thinning 11/12/2019   Recurrent major depressive disorder, in full remission (HCC) 11/04/2019   Gastroesophageal reflux disease 09/09/2019   Insomnia 09/09/2019   Anxiety disorder 09/09/2019   Essential hypertension 06/03/2019   Hyperlipidemia 06/03/2019   History of cancer 04/29/2019   Vitamin B12 deficiency 04/29/2019   Vitamin D deficiency 04/29/2019    Medications- reviewed and updated Current Outpatient Medications  Medication Sig Dispense Refill   amLODipine (NORVASC) 5 MG tablet Take 1 tablet (5 mg total) by mouth daily. 90 tablet 3   atorvastatin (LIPITOR) 40 MG tablet Take 1 tablet (40 mg total) by mouth daily. 90 tablet 3   azelastine (ASTELIN) 0.1 % nasal spray Place 2 sprays into both nostrils 2 (two) times daily. Use in each nostril as directed 30 mL 5   Blood Pressure KIT 1 each by Does not apply route once a week. 1 kit 0   buPROPion (WELLBUTRIN XL) 150 MG 24 hr tablet Take 1 tablet (150 mg total) by mouth daily. 90 tablet 1   celecoxib (CELEBREX) 100 MG capsule Take 1 capsule (100 mg total) by mouth 2 (two) times daily. 30 capsule 4   esomeprazole (NEXIUM) 40 MG capsule Take 1 capsule (40 mg total) by mouth daily. 90 capsule 1   fluticasone (FLONASE) 50 MCG/ACT nasal spray Place  2 sprays into both nostrils daily. 16 g 6   furosemide (LASIX) 20 MG tablet TAKE 1 TABLET TWICE A DAY 180 tablet 3   gabapentin (NEURONTIN) 300 MG capsule TAKE 2 CAPSULES 3 TIMES A  DAY 270 capsule 1   Hot/Cold Therapy Aids (HOT/COLD PACK) PADS Use as directed 3 each 0   Humidifiers MISC Use as needed to prevent dry mouth 1 each 0   KLOR-CON M20 20 MEQ tablet TAKE 1 TABLET TWICE A DAY 180 tablet 1   levocetirizine (XYZAL) 5 MG tablet Take 5 mg by mouth 2 (two) times daily as needed.     meloxicam (MOBIC) 7.5 MG tablet Take 1 tablet (7.5 mg total) by mouth daily. 10 tablet 0   pilocarpine (SALAGEN) 5 MG tablet Take 1 tablet (5 mg total) by mouth 2 (two) times daily. 60 tablet 1   propranolol (INDERAL) 10 MG tablet TAKE 1 TABLET BY MOUTH TWICE DAILY AS NEEDED (ANXIETY) 60 tablet 0   sertraline (ZOLOFT) 50 MG tablet TAKE 1 AND 1/2 TABLETS AT  BEDTIME 135 tablet 3   traZODone (DESYREL) 100 MG tablet Take 1 tablet (100 mg total) by mouth at bedtime as needed for sleep. Please note, did larger dose so only 1 tab 90 tablet 3   phenazopyridine (PYRIDIUM) 100 MG tablet Take 1 tablet (100 mg total) by mouth 3 (three) times daily as needed for pain. (Patient not taking: Reported on 09/15/2023) 6 tablet 0  No current facility-administered medications for this visit.     Objective:  BP 130/80   Pulse 66   Temp (!) 97.4 F (36.3 C)   Ht 5\' 1"  (1.549 m)   Wt 137 lb (62.1 kg)   SpO2 99%   BMI 25.89 kg/m  Gen: NAD, resting comfortably CV: RRR no murmurs rubs or gallops Lungs: CTAB no crackles, wheeze, rhonchi  right knee: Normal Left knee: Visibly swollen over the left tibia/infrapatellar bursa-erythematous and very mildly warm to touch.  Otherwise normal to inspection with no erythema, warmth or effusion Good range of motion noted.Ligaments with solid consistent endpoints including ACL, PCL, LCL, MCL. Negative  provocative meniscal tests. Ext: Trace edema Skin: warm, dry-no erythema over the legs  other than infrapatellar bursa Neuro: Walks with cane    Assessment and Plan   # Left leg pain- over tibia after fall S: Patient reports having 3 falls about 6 weeks ago- reports losing her balance going to the trash area at her apartment where its uneven- fell on same spot each time just below the left knee- basically fell on the tibia. She has had ongoing pain in that area and some skin discoloration- like a bruise but has not resolved. Area is very tender to touch - she was not using cane at that time but has used since then and doing better- not falls.  She has developed left leg pain since that time. Severe pain even at rest and worsens if area gets hit or even touched. No fevers. No worsening redness.  -She reports having some headaches-feels like these have been triggered by the pain in her leg A/P: infrapatellar bursitis caused by trauma (fortunately not having falls anymore)- we are going to try to calm this down with meloxicam (she will monitor blood pressure and stop if blood pressure regularly over 145/90). We discussed gastrointestinal bleeding risk  in addition to mild cardiac risks and risks to blood pressure which as noted she will monitor - we discussed if fails to improve in 10-14 days to let me know and I can refer to sports medicine  -relative rest advised -reports off Celebrex- so we will trial meloxicam- knows not to take Celebrex or ibuprofen.aleve/motrin -Reports recurrence of headaches since having to deal with this pain-encouraged follow-up with Dr. Ruthine Dose in a month as well as potentially seeing Dr. Everlena Cooper if fails to improve with this intervention -Enforced importance of avoiding falls and to use her cane at all times-thankfully since using that she has not had recurrent falls   #hypertension S: medication: Amlodipine 5 mg, Lasix 20 mg twice daily, propranolol available but only for anxiety and that used for hypertension BP Readings from Last 3 Encounters:  09/15/23  130/80  07/17/23 116/68  06/20/23 101/68  A/P: Blood pressure is well-controlled-continue current medication-warned of risks of meloxicam and she will monitor her blood pressure as above No specialty comments available.  Recommended follow up: Return for as needed for new, worsening, persistent symptoms. No future appointments.  Lab/Order associations:   ICD-10-CM   1. Infrapatellar bursitis of left knee  M70.52     2. Essential hypertension  I10       Meds ordered this encounter  Medications   meloxicam (MOBIC) 7.5 MG tablet    Sig: Take 1 tablet (7.5 mg total) by mouth daily.    Dispense:  10 tablet    Refill:  0    Return precautions advised.  Tana Conch, MD

## 2023-09-21 ENCOUNTER — Ambulatory Visit: Admitting: Family Medicine

## 2023-09-25 ENCOUNTER — Encounter: Payer: Self-pay | Admitting: Family Medicine

## 2023-09-25 ENCOUNTER — Ambulatory Visit: Admitting: Family Medicine

## 2023-09-25 ENCOUNTER — Ambulatory Visit (INDEPENDENT_AMBULATORY_CARE_PROVIDER_SITE_OTHER)

## 2023-09-25 ENCOUNTER — Other Ambulatory Visit: Payer: Self-pay

## 2023-09-25 VITALS — BP 118/76 | HR 69 | Ht 61.0 in | Wt 133.0 lb

## 2023-09-25 DIAGNOSIS — M79662 Pain in left lower leg: Secondary | ICD-10-CM

## 2023-09-25 DIAGNOSIS — M25562 Pain in left knee: Secondary | ICD-10-CM | POA: Diagnosis not present

## 2023-09-25 DIAGNOSIS — G8929 Other chronic pain: Secondary | ICD-10-CM | POA: Diagnosis not present

## 2023-09-25 DIAGNOSIS — M7042 Prepatellar bursitis, left knee: Secondary | ICD-10-CM | POA: Diagnosis not present

## 2023-09-25 MED ORDER — DICLOFENAC SODIUM 1 % EX GEL: 4.0000 g | Freq: Four times a day (QID) | CUTANEOUS | Status: AC

## 2023-09-25 NOTE — Progress Notes (Signed)
   I, Stevenson Clinch, CMA acting as a scribe for Clementeen Graham, MD.  Janica Eldred is a 68 y.o. female who presents to [facility] today for L lower leg pain. Pt was previously seen by Dr. Denyse Amass on 10/13/22 for R shoulder pain.  Today, pt c/o L lower leg pain x a couple of months. Very TTP. Pt locates pain to the anterior aspect of the L knee, around the tibial tuberosity. She notes suffering several falls over the last couple months, landing on her L knee  .   Aggravates: TTP Treatments tried: heat, ice, meloxicam  Pertinent review of systems: No fevers or chills  Relevant historical information: Hypertension.  Chronic bronchitis.   Exam:  BP 118/76   Pulse 69   Ht 5\' 1"  (1.549 m)   Wt 133 lb (60.3 kg)   SpO2 97%   BMI 25.13 kg/m  General: Well Developed, well nourished, and in no acute distress.   MSK: Left anterior knee nodule visible at anterior knee overlying the tibial tubercle.  Tender palpation of this region.  Decreased knee motion.  Pain with resisted knee extension is present.  Strength is intact.    Lab and Radiology Results  Diagnostic Limited MSK Ultrasound of: Left knee Quad tendon is intact normal-appearing without significant joint effusion. Patellar tendon is intact. At distal tendon insertion hypoechoic fluid present overlying tendon consistent with prepatellar bursitis. Medial and lateral joint lines are normal-appearing. Posterior knee tiny Baker's cyst is present. Impression: Prepatellar bursitis   X-ray images left knee obtained today personally and independently interpreted. Tissue swelling anterior knee distally.  No acute fractures are visible.  Minimal DJD Await formal radiology review   Assessment and Plan: 67 y.o. female with left anterior knee pain and swelling due to prepatellar bursitis.  Plan for Voltaren gel and compression.  Recheck in 2 weeks if not improved consider injection.   PDMP not reviewed this  encounter. Orders Placed This Encounter  Procedures   [place] LIMITED JOINT SPACE STRUCTURES LOW LEFT(NO LINKED CHARGES)    Reason for Exam (SYMPTOM  OR DIAGNOSIS REQUIRED):   left leg pain    Preferred imaging location?:   [facility] Knee AP/LAT W/[place] Left    Standing Status:   Future    Number of Occurrences:   1    Expiration Date:   10/26/2023    Reason for Exam (SYMPTOM  OR DIAGNOSIS REQUIRED):   left knee pain    Preferred imaging location?:    [facility]   Meds ordered this encounter  Medications   diclofenac Sodium (VOLTAREN) 1 % topical gel 4 g     Discussed warning signs or symptoms. Please see discharge instructions. Patient expresses understanding.   The above documentation has been reviewed and is accurate and complete Clementeen Graham, M.D.

## 2023-09-25 NOTE — Patient Instructions (Addendum)
 Thank you for coming in today  Please get an Xray today before you leave  Please use Voltaren gel (Generic Diclofenac Gel) up to 4x daily for pain as needed.  This is available over-the-counter as both the name brand Voltaren gel and the generic diclofenac gel.   Use an ace wrap for compression  Check back in 2 weeks

## 2023-10-04 DIAGNOSIS — M166 Other bilateral secondary osteoarthritis of hip: Secondary | ICD-10-CM | POA: Diagnosis not present

## 2023-10-04 DIAGNOSIS — M162 Bilateral osteoarthritis resulting from hip dysplasia: Secondary | ICD-10-CM | POA: Diagnosis not present

## 2023-10-10 ENCOUNTER — Encounter: Payer: Self-pay | Admitting: Family Medicine

## 2023-10-10 NOTE — Progress Notes (Signed)
Left knee x-ray looks okay to radiology

## 2023-10-13 ENCOUNTER — Other Ambulatory Visit: Payer: Self-pay | Admitting: Family Medicine

## 2023-10-16 ENCOUNTER — Ambulatory Visit: Admitting: Family Medicine

## 2023-11-02 ENCOUNTER — Ambulatory Visit (HOSPITAL_BASED_OUTPATIENT_CLINIC_OR_DEPARTMENT_OTHER): Admitting: Orthopaedic Surgery

## 2023-11-02 ENCOUNTER — Ambulatory Visit (HOSPITAL_BASED_OUTPATIENT_CLINIC_OR_DEPARTMENT_OTHER)

## 2023-11-02 DIAGNOSIS — M5412 Radiculopathy, cervical region: Secondary | ICD-10-CM

## 2023-11-02 DIAGNOSIS — M7521 Bicipital tendinitis, right shoulder: Secondary | ICD-10-CM | POA: Diagnosis not present

## 2023-11-02 DIAGNOSIS — M25511 Pain in right shoulder: Secondary | ICD-10-CM | POA: Diagnosis not present

## 2023-11-02 DIAGNOSIS — M19011 Primary osteoarthritis, right shoulder: Secondary | ICD-10-CM | POA: Diagnosis not present

## 2023-11-02 MED ORDER — METHYLPREDNISOLONE 4 MG PO TBPK: ORAL_TABLET | ORAL | 0 refills | Status: DC

## 2023-11-02 NOTE — Progress Notes (Signed)
 Post Operative Evaluation     Interval History:   Presents today for follow-up of her right shoulder.  She is still experiencing numbness and pain radiating down the shoulder.  She is having a very difficult time sleeping on the side   PMH/PSH/Family History/Social History/Meds/Allergies:    Past Medical History:  Diagnosis Date   Anxiety disorder 09/09/2019   B12 deficiency    Cancer (HCC)    cervical cancer   Depression    Diverticulitis    Fatty liver    GERD (gastroesophageal reflux disease)    Hyperlipidemia    Hypertension    Insomnia 09/09/2019   Liver cyst    Vitamin D  deficiency    Past Surgical History:  Procedure Laterality Date   ABDOMINAL HYSTERECTOMY     COLON RESECTION     due to diverticulitis   SHOULDER ARTHROSCOPY WITH ROTATOR CUFF REPAIR Right 11/15/2022   Procedure: RIGHT SHOULDER ARTHROSCOPY WITH BICEPS TENODESIS;  Surgeon: Wilhelmenia Harada, MD;  Location: [facility];  Service: Orthopedics;  Laterality: Right;   Social History   Socioeconomic History   Marital status: Single    Spouse name: Not on file   Number of children: 1   Years of education: Not on file   Highest education level: Not on file  Occupational History   Occupation: retired   Occupation: retired  Tobacco Use   Smoking status: Light Smoker    Current packs/day: 0.50    Types: Cigarettes   Smokeless tobacco: Never   Tobacco comments:    Smokes 3 cigarettes a day   Vaping Use   Vaping status: Never Used  Substance and Sexual Activity   Alcohol use: Not Currently    Comment: occasional beer or bloody mary once a week   Drug use: Never   Sexual activity: Not on file  Other Topics Concern   Not on file  Social History Narrative   Are you right handed or left handed? Right    Are you currently employed ? Retired   What is your current occupation?   Do you live at home alone? Y   Who lives with you?    What type of home  do you live in: 1 story or 2 story?        Social Drivers of Corporate investment banker Strain: Low Risk  (01/24/2023)   Overall Financial Resource Strain (CARDIA)    Difficulty of Paying Living Expenses: Not hard at all  Food Insecurity: No Food Insecurity (01/24/2023)   Hunger Vital Sign    Worried About Running Out of Food in the Last Year: Never true    Ran Out of Food in the Last Year: Never true  Transportation Needs: No Transportation Needs (01/24/2023)   PRAPARE - Administrator, Civil Service (Medical): No    Lack of Transportation (Non-Medical): No  Physical Activity: Sufficiently Active (01/24/2023)   Exercise Vital Sign    Days of Exercise per Week: 5 days    Minutes of Exercise per Session: 30 min  Stress: No Stress Concern Present (01/24/2023)   [employer] of Occupational Health - Occupational Stress Questionnaire    Feeling of Stress : Not at all  Social Connections: Socially Isolated (01/24/2023)   Social Connection and Isolation Panel [NHANES]  Frequency of Communication with Friends and Family: More than three times a week    Frequency of Social Gatherings with Friends and Family: Once a week    Attends Religious Services: Never    Database administrator or Organizations: No    Attends Engineer, structural: Never    Marital Status: Never married   Family History  Problem Relation Age of Onset   Stroke Mother    Colon cancer Neg Hx    Esophageal cancer Neg Hx    Stomach cancer Neg Hx    Rectal cancer Neg Hx    Sleep apnea Neg Hx    Allergies  Allergen Reactions   Lisinopril  Swelling   Losartan  Swelling   Pseudoephedrine-Naproxen Na Er     Other Reaction(s): Other (See Comments). Swelling reported in multiple joints   Current Outpatient Medications  Medication Sig Dispense Refill   methylPREDNISolone  (MEDROL  DOSEPAK) 4 MG TBPK tablet Take per packet instructions 1 each 0   amLODipine  (NORVASC ) 5 MG tablet Take 1 tablet (5 mg  total) by mouth daily. 90 tablet 3   atorvastatin  (LIPITOR) 40 MG tablet Take 1 tablet (40 mg total) by mouth daily. 90 tablet 3   azelastine  (ASTELIN ) 0.1 % nasal spray Place 2 sprays into both nostrils 2 (two) times daily. Use in each nostril as directed 30 mL 5   Blood Pressure KIT 1 each by Does not apply route once a week. 1 kit 0   buPROPion  (WELLBUTRIN  XL) 150 MG 24 hr tablet Take 1 tablet (150 mg total) by mouth daily. 90 tablet 1   celecoxib  (CELEBREX ) 100 MG capsule Take 1 capsule (100 mg total) by mouth 2 (two) times daily. 30 capsule 4   esomeprazole  (NEXIUM ) 40 MG capsule Take 1 capsule (40 mg total) by mouth daily. 90 capsule 1   fluticasone  (FLONASE ) 50 MCG/ACT nasal spray Place 2 sprays into both nostrils daily. 16 g 6   furosemide  (LASIX ) 20 MG tablet TAKE 1 TABLET TWICE A DAY 180 tablet 3   gabapentin  (NEURONTIN ) 300 MG capsule TAKE 2 CAPSULES 3 TIMES A  DAY 270 capsule 1   Humidifiers MISC Use as needed to prevent dry mouth 1 each 0   KLOR-CON  M20 20 MEQ tablet TAKE 1 TABLET TWICE A DAY 180 tablet 1   levocetirizine (XYZAL ) 5 MG tablet Take 5 mg by mouth 2 (two) times daily as needed.     meloxicam  (MOBIC ) 7.5 MG tablet Take 1 tablet (7.5 mg total) by mouth daily. 10 tablet 0   phenazopyridine  (PYRIDIUM ) 100 MG tablet Take 1 tablet (100 mg total) by mouth 3 (three) times daily as needed for pain. 6 tablet 0   pilocarpine  (SALAGEN ) 5 MG tablet Take 1 tablet (5 mg total) by mouth 2 (two) times daily. 60 tablet 1   propranolol  (INDERAL ) 10 MG tablet TAKE 1 TABLET BY MOUTH TWICE DAILY AS NEEDED (ANXIETY) 60 tablet 0   sertraline  (ZOLOFT ) 50 MG tablet TAKE 1 AND 1/2 TABLETS AT  BEDTIME 135 tablet 3   traZODone  (DESYREL ) 100 MG tablet Take 1 tablet (100 mg total) by mouth at bedtime as needed for sleep. Please note, did larger dose so only 1 tab 90 tablet 3   Current Facility-Administered Medications  Medication Dose Route Frequency Provider Last Rate Last Admin   diclofenac  Sodium  (VOLTAREN ) 1 % topical gel 4 g  4 g Topical QID Corey, Evan S, MD       No results  found.  Review of Systems:   A ROS was performed including pertinent positives and negatives as documented in the HPI.   Musculoskeletal Exam:    There were no vitals taken for this visit.  Incisions are well-appearing without erythema or drainage.  Forward elevation is to 170 degrees equal to the contralateral side.  No tenderness over the biceps tendon.  There is some bruising about shoulder  Positive Spurling maneuver on the right with radiation down the right arm.  Otherwise intact strength right upper extremity.  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   Assessment:   68 year old female with evidence of right shoulder cervical radiculopathy.  At this time I did discuss that ultimately I believe she would benefit from the cervical injection.  Will plan for referral to Dr. Daisey Dryer for discussion of this.  Have also provided a Medrol  Dosepak as well Plan :    - Plan for referral to Dr. Daisey Dryer for right-sided C6-C7 foraminal cervical injection      I personally saw and evaluated the patient, and participated in the management and treatment plan.  Wilhelmenia Harada, MD Attending Physician, Orthopedic Surgery  This document was dictated using Dragon voice recognition software. A reasonable attempt at proof reading has been made to minimize errors.

## 2023-11-02 NOTE — Addendum Note (Signed)
 Addended by: Albesa Huguenin on: 11/02/2023 11:25 AM   Modules accepted: Orders

## 2023-11-06 DIAGNOSIS — R700 Elevated erythrocyte sedimentation rate: Secondary | ICD-10-CM | POA: Diagnosis not present

## 2023-11-10 ENCOUNTER — Telehealth (HOSPITAL_BASED_OUTPATIENT_CLINIC_OR_DEPARTMENT_OTHER): Payer: Self-pay | Admitting: Orthopaedic Surgery

## 2023-11-10 NOTE — Telephone Encounter (Signed)
 Patient states that the medication does not work for her and she is still waiting to hear from Dr B partner that he was supposed to sent a referral for her. Best contact number [phone]

## 2023-11-13 ENCOUNTER — Telehealth: Payer: Self-pay

## 2023-11-13 ENCOUNTER — Other Ambulatory Visit: Payer: Self-pay | Admitting: Physical Medicine and Rehabilitation

## 2023-11-13 MED ORDER — DIAZEPAM 5 MG PO TABS: ORAL_TABLET | ORAL | 0 refills | Status: DC

## 2023-11-13 NOTE — Telephone Encounter (Signed)
 Patient wants Valium prior to [facility] on May 14, Please call into [facility]  [address] [place]  [address]

## 2023-11-15 ENCOUNTER — Other Ambulatory Visit (HOSPITAL_COMMUNITY): Payer: Self-pay

## 2023-11-21 ENCOUNTER — Telehealth: Payer: Self-pay | Admitting: Family Medicine

## 2023-11-21 DIAGNOSIS — Z0279 Encounter for issue of other medical certificate: Secondary | ICD-10-CM

## 2023-11-21 NOTE — Telephone Encounter (Signed)
 Form placed on providers desk

## 2023-11-21 NOTE — Telephone Encounter (Signed)
 Patient dropped off document [place] [facility] , to be filled out by provider. Patient requested to send it back via Fax to [phone] within 7-days. Document is located in providers tray at front office.Please advise at Mobile [phone] (mobile)

## 2023-11-22 ENCOUNTER — Ambulatory Visit: Admitting: Physical Medicine and Rehabilitation

## 2023-11-22 ENCOUNTER — Telehealth: Payer: Self-pay | Admitting: *Deleted

## 2023-11-22 ENCOUNTER — Other Ambulatory Visit: Payer: Self-pay

## 2023-11-22 VITALS — BP 105/73 | HR 65

## 2023-11-22 DIAGNOSIS — M5412 Radiculopathy, cervical region: Secondary | ICD-10-CM

## 2023-11-22 MED ORDER — METHYLPREDNISOLONE ACETATE 40 MG/ML IJ SUSP
40.0000 mg | Freq: Once | INTRAMUSCULAR | Status: AC
  Administered 2023-11-22: 40 mg

## 2023-11-22 NOTE — Progress Notes (Signed)
 Pain Scale   Average Pain 5 Patient advising she is in constant pain without relief, she also advised her pain radiates to her right shoulder.        +Driver, -BT, -Dye Allergies.

## 2023-11-22 NOTE — Telephone Encounter (Signed)
Form faxed, patient notified.

## 2023-11-22 NOTE — Telephone Encounter (Signed)
 Copied from CRM [id number]. Topic: General - Transportation >> Nov 22, 2023  3:10 PM Baldo Levan wrote: Reason for CRM: Jennetta from [employer] was calling in to advise that the application for transit forms she did receive but is missing page 7. She is requesting these be faxed again to [phone]  Spoke to Jenntta and she stated she needed page 7 of 8 where the patient signed, re-faxed. Faxed requested page to Jennetta.

## 2023-11-22 NOTE — Patient Instructions (Signed)

## 2023-11-28 ENCOUNTER — Telehealth (HOSPITAL_BASED_OUTPATIENT_CLINIC_OR_DEPARTMENT_OTHER): Payer: Self-pay | Admitting: Orthopaedic Surgery

## 2023-11-28 ENCOUNTER — Telehealth: Payer: Self-pay | Admitting: Physical Medicine and Rehabilitation

## 2023-11-28 DIAGNOSIS — M5412 Radiculopathy, cervical region: Secondary | ICD-10-CM

## 2023-11-28 NOTE — Telephone Encounter (Signed)
 Pt called to inform Dr Daisey Dryer that the injection did not work. Pt phone number is [phone].

## 2023-11-28 NOTE — Telephone Encounter (Signed)
 Patient states that the injection that Dr Daisey Dryer gave her did not work at all and wants to know what to do from here. [phone]

## 2023-11-28 NOTE — Telephone Encounter (Signed)
 Dr. Sulema Endo referral?

## 2023-11-29 NOTE — Procedures (Signed)
 Cervical Epidural Steroid Injection - Interlaminar Approach with Fluoroscopic Guidance  Patient: Tracey Saunders      Date of Birth: 05/26/1956 MRN: [id number] PCP: Christel Cousins, MD      Visit Date: 11/22/2023   Universal Protocol:    Date/Time: 05/21/256:11 AM  Consent Given By: the patient  Position: PRONE  Additional Comments: Vital signs were monitored before and after the procedure. Patient was prepped and draped in the usual sterile fashion. The correct patient, procedure, and site was verified.   Injection Procedure Details:   Procedure diagnoses: Radiculopathy, cervical region [M54.12]    Meds Administered:  Meds ordered this encounter  Medications   methylPREDNISolone  acetate (DEPO-MEDROL ) injection 40 mg     Laterality: Right  Location/Site: C7-T1  Needle: 3.5 in., 20 ga. Tuohy  Needle Placement: Paramedian epidural space  Findings:  -Comments: Excellent flow of contrast into the epidural space.  Procedure Details: Using a paramedian approach from the side mentioned above, the region overlying the inferior lamina was localized under fluoroscopic visualization and the soft tissues overlying this structure were infiltrated with 4 ml. of 1% Lidocaine  without Epinephrine. A # 20 gauge, Tuohy needle was inserted into the epidural space using a paramedian approach.  The epidural space was localized using loss of resistance along with contralateral oblique bi-planar fluoroscopic views.  After negative aspirate for air, blood, and CSF, a 2 ml. volume of Isovue-250 was injected into the epidural space and the flow of contrast was observed. Radiographs were obtained for documentation purposes.   The injectate was administered into the level noted above.  Additional Comments:  The patient tolerated the procedure well Dressing: 2 x 2 sterile gauze and Band-Aid    Post-procedure details: Patient was observed during the procedure. Post-procedure instructions were  reviewed.  Patient left the clinic in stable condition.

## 2023-11-29 NOTE — Progress Notes (Signed)
 Tracey Saunders - 68 y.o. female MRN [id number]  Date of birth: Mar 24, 1956  Office Visit Note: Visit Date: 11/22/2023 PCP: Christel Cousins, MD Referred by: Wilhelmenia Harada, MD  Subjective: Chief Complaint  Patient presents with   Neck - Pain   HPI:  Tracey Saunders is a 68 y.o. female who comes in today at the request of Dr. Wilhelmenia Harada for planned Right C7-T1 Cervical Interlaminar epidural steroid injection with fluoroscopic guidance.  The patient has failed conservative care including home exercise, medications, time and activity modification.  This injection will be diagnostic and hopefully therapeutic.  Please see requesting physician notes for further details and justification.   ROS Otherwise per HPI.  Assessment & Plan: Visit Diagnoses:    ICD-10-CM   1. Radiculopathy, cervical region  M54.12 XR C-ARM NO REPORT    Epidural Steroid injection    methylPREDNISolone  acetate (DEPO-MEDROL ) injection 40 mg      Plan: No additional findings.   Meds & Orders:  Meds ordered this encounter  Medications   methylPREDNISolone  acetate (DEPO-MEDROL ) injection 40 mg    Orders Placed This Encounter  Procedures   XR C-ARM NO REPORT   Epidural Steroid injection    Follow-up: No follow-ups on file.   Procedures: No procedures performed  Cervical Epidural Steroid Injection - Interlaminar Approach with Fluoroscopic Guidance  Patient: Tracey Saunders      Date of Birth: [birth year] MRN: [id number] PCP: Christel Cousins, MD      Visit Date: 11/22/2023   Universal Protocol:    Date/Time: 05/21/256:11 AM  Consent Given By: the patient  Position: PRONE  Additional Comments: Vital signs were monitored before and after the procedure. Patient was prepped and draped in the usual sterile fashion. The correct patient, procedure, and site was verified.   Injection Procedure Details:   Procedure diagnoses: Radiculopathy, cervical region [M54.12]    Meds Administered:  Meds ordered  this encounter  Medications   methylPREDNISolone  acetate (DEPO-MEDROL ) injection 40 mg     Laterality: Right  Location/Site: C7-T1  Needle: 3.5 in., 20 ga. Tuohy  Needle Placement: Paramedian epidural space  Findings:  -Comments: Excellent flow of contrast into the epidural space.  Procedure Details: Using a paramedian approach from the side mentioned above, the region overlying the inferior lamina was localized under fluoroscopic visualization and the soft tissues overlying this structure were infiltrated with 4 ml. of 1% Lidocaine  without Epinephrine. A # 20 gauge, Tuohy needle was inserted into the epidural space using a paramedian approach.  The epidural space was localized using loss of resistance along with contralateral oblique bi-planar fluoroscopic views.  After negative aspirate for air, blood, and CSF, a 2 ml. volume of Isovue-250 was injected into the epidural space and the flow of contrast was observed. Radiographs were obtained for documentation purposes.   The injectate was administered into the level noted above.  Additional Comments:  The patient tolerated the procedure well Dressing: 2 x 2 sterile gauze and Band-Aid    Post-procedure details: Patient was observed during the procedure. Post-procedure instructions were reviewed.  Patient left the clinic in stable condition.   Clinical History: MRI CERVICAL SPINE WITHOUT CONTRAST   TECHNIQUE: Multiplanar, multisequence MR imaging of the cervical spine was performed. No intravenous contrast was administered.   COMPARISON:  Cervical spine radiographs 02/16/2023   FINDINGS: Alignment: Minimal retrolisthesis at C5-6.   Vertebrae: No acute or suspicious osseous findings.   Cord: Normal in signal and caliber.   Posterior Fossa, vertebral arteries,  paraspinal tissues: Visualized portions of the posterior fossa appear unremarkable.Bilateral vertebral artery flow voids. No significant paraspinal findings.    Disc levels:   C2-3: Normal interspace.   C3-4: Spondylosis with moderate loss of disc height, diffuse disc bulging and asymmetric uncinate spurring on the right. Severe right foraminal narrowing with probable right C4 nerve root encroachment. Mild left foraminal narrowing. Borderline spinal stenosis without cord deformity.   C4-5: Spondylosis with moderate loss of disc height and posterior osteophytes covering diffusely bulging disc material. Mild spinal stenosis without cord deformity. Moderate to severe foraminal narrowing bilaterally with possible encroachment on both C5 nerve roots.   C5-6: Spondylosis with moderate loss of disc height and posterior osteophytes covering diffusely bulging disc material. Mild spinal stenosis without cord deformity. Moderate to severe foraminal narrowing bilaterally with possible encroachment on both C6 nerve roots.   C6-7: Mild disc bulging. No spinal stenosis or significant foraminal narrowing.   C7-T1: Mild facet hypertrophy. No spinal stenosis or nerve root encroachment.   IMPRESSION: 1. Multilevel cervical spondylosis, most advanced from C3-4 through C5-6. There is resulting mild spinal stenosis and moderate to severe foraminal narrowing bilaterally at C4-5 and C5-6 and on the right at C3-4. 2. No acute findings or evidence of cord compression.     Electronically Signed   By: Elmon Hagedorn M.D.   On: 04/03/2023 16:10     Objective:  VS:  HT:    WT:   BMI:     BP:105/73  HR:65bpm  TEMP: ( )  RESP:  Physical Exam Vitals and nursing note reviewed.  Constitutional:      General: She is not in acute distress.    Appearance: Normal appearance. She is not ill-appearing.  HENT:     Head: Normocephalic and atraumatic.     Right Ear: External ear normal.     Left Ear: External ear normal.  Eyes:     Extraocular Movements: Extraocular movements intact.  Cardiovascular:     Rate and Rhythm: Normal rate.     Pulses: Normal  pulses.  Musculoskeletal:     Cervical back: Tenderness present. No rigidity.     Right lower leg: No edema.     Left lower leg: No edema.     Comments: Patient has good strength in the upper extremities including 5 out of 5 strength in wrist extension long finger flexion and APB.  There is no atrophy of the hands intrinsically.  There is a negative Hoffmann's test.   Lymphadenopathy:     Cervical: No cervical adenopathy.  Skin:    Findings: No erythema, lesion or rash.  Neurological:     General: No focal deficit present.     Mental Status: She is alert and oriented to person, place, and time.     Sensory: No sensory deficit.     Motor: No weakness or abnormal muscle tone.     Coordination: Coordination normal.  Psychiatric:        Mood and Affect: Mood normal.        Behavior: Behavior normal.      Imaging: No results found.

## 2023-11-29 NOTE — Telephone Encounter (Signed)
 Referral was placed, I called and advised pt of plan

## 2023-12-05 ENCOUNTER — Other Ambulatory Visit: Payer: Self-pay | Admitting: Family Medicine

## 2023-12-05 DIAGNOSIS — I10 Essential (primary) hypertension: Secondary | ICD-10-CM

## 2023-12-11 ENCOUNTER — Encounter: Payer: Self-pay | Admitting: Family Medicine

## 2023-12-11 ENCOUNTER — Ambulatory Visit: Payer: Self-pay | Admitting: Family Medicine

## 2023-12-11 ENCOUNTER — Ambulatory Visit (INDEPENDENT_AMBULATORY_CARE_PROVIDER_SITE_OTHER): Admitting: Family Medicine

## 2023-12-11 VITALS — BP 99/60 | HR 69 | Temp 97.5°F | Resp 18 | Ht 61.0 in | Wt 134.4 lb

## 2023-12-11 DIAGNOSIS — M542 Cervicalgia: Secondary | ICD-10-CM | POA: Diagnosis not present

## 2023-12-11 DIAGNOSIS — E538 Deficiency of other specified B group vitamins: Secondary | ICD-10-CM

## 2023-12-11 DIAGNOSIS — F3342 Major depressive disorder, recurrent, in full remission: Secondary | ICD-10-CM | POA: Diagnosis not present

## 2023-12-11 DIAGNOSIS — I10 Essential (primary) hypertension: Secondary | ICD-10-CM | POA: Diagnosis not present

## 2023-12-11 DIAGNOSIS — F411 Generalized anxiety disorder: Secondary | ICD-10-CM | POA: Diagnosis not present

## 2023-12-11 DIAGNOSIS — K219 Gastro-esophageal reflux disease without esophagitis: Secondary | ICD-10-CM | POA: Diagnosis not present

## 2023-12-11 DIAGNOSIS — E7800 Pure hypercholesterolemia, unspecified: Secondary | ICD-10-CM

## 2023-12-11 DIAGNOSIS — E559 Vitamin D deficiency, unspecified: Secondary | ICD-10-CM

## 2023-12-11 LAB — CBC WITH DIFFERENTIAL/PLATELET
Basophils Absolute: 0.1 10*3/uL (ref 0.0–0.1)
Basophils Relative: 0.8 % (ref 0.0–3.0)
Eosinophils Absolute: 0.1 10*3/uL (ref 0.0–0.7)
Eosinophils Relative: 1.8 % (ref 0.0–5.0)
HCT: 36.6 % (ref 36.0–46.0)
Hemoglobin: 12.3 g/dL (ref 12.0–15.0)
Lymphocytes Relative: 29.1 % (ref 12.0–46.0)
Lymphs Abs: 1.9 10*3/uL (ref 0.7–4.0)
MCHC: 33.7 g/dL (ref 30.0–36.0)
MCV: 94.3 fl (ref 78.0–100.0)
Monocytes Absolute: 0.5 10*3/uL (ref 0.1–1.0)
Monocytes Relative: 8.1 % (ref 3.0–12.0)
Neutro Abs: 4 10*3/uL (ref 1.4–7.7)
Neutrophils Relative %: 60.2 % (ref 43.0–77.0)
Platelets: 278 10*3/uL (ref 150.0–400.0)
RBC: 3.88 Mil/uL (ref 3.87–5.11)
RDW: 14.5 % (ref 11.5–15.5)
WBC: 6.7 10*3/uL (ref 4.0–10.5)

## 2023-12-11 LAB — COMPREHENSIVE METABOLIC PANEL WITH GFR
ALT: 13 U/L (ref 0–35)
AST: 19 U/L (ref 0–37)
Albumin: 4.4 g/dL (ref 3.5–5.2)
Alkaline Phosphatase: 57 U/L (ref 39–117)
BUN: 16 mg/dL (ref 6–23)
CO2: 28 meq/L (ref 19–32)
Calcium: 9.2 mg/dL (ref 8.4–10.5)
Chloride: 101 meq/L (ref 96–112)
Creatinine, Ser: 0.87 mg/dL (ref 0.40–1.20)
GFR: 67.92 mL/min (ref >60.00)
Glucose, Bld: 78 mg/dL (ref 70–99)
Potassium: 3.6 meq/L (ref 3.5–5.1)
Sodium: 139 meq/L (ref 135–145)
Total Bilirubin: 0.3 mg/dL (ref 0.2–1.2)
Total Protein: 6.7 g/dL (ref 6.0–8.3)

## 2023-12-11 LAB — VITAMIN B12: Vitamin B-12: 330 pg/mL (ref 211–911)

## 2023-12-11 LAB — TSH: TSH: 1.27 u[IU]/mL (ref 0.35–5.50)

## 2023-12-11 LAB — MAGNESIUM: Magnesium: 1.8 mg/dL (ref 1.5–2.5)

## 2023-12-11 MED ORDER — SERTRALINE HCL 50 MG PO TABS: 150.0000 mg | ORAL_TABLET | Freq: Every day | ORAL | 3 refills | Status: DC

## 2023-12-11 MED ORDER — KETOROLAC TROMETHAMINE 60 MG/2ML IM SOLN
60.0000 mg | Freq: Once | INTRAMUSCULAR | Status: AC
  Administered 2023-12-11: 60 mg via INTRAMUSCULAR

## 2023-12-11 MED ORDER — POTASSIUM CHLORIDE CRYS ER 20 MEQ PO TBCR: 20.0000 meq | EXTENDED_RELEASE_TABLET | Freq: Two times a day (BID) | ORAL | 1 refills | Status: DC

## 2023-12-11 MED ORDER — ESOMEPRAZOLE MAGNESIUM 40 MG PO CPDR: 40.0000 mg | DELAYED_RELEASE_CAPSULE | Freq: Every day | ORAL | 1 refills | Status: DC

## 2023-12-11 MED ORDER — TRAZODONE HCL 100 MG PO TABS: 100.0000 mg | ORAL_TABLET | Freq: Every evening | ORAL | 3 refills | Status: AC | PRN

## 2023-12-11 MED ORDER — CYANOCOBALAMIN 1000 MCG/ML IJ SOLN
1000.0000 ug | Freq: Once | INTRAMUSCULAR | Status: AC
  Administered 2023-12-11: 1000 ug via INTRAMUSCULAR

## 2023-12-11 NOTE — Patient Instructions (Addendum)
 Increase wellbutrin  to 300mg  daily so take 2 of the 150's-let me know how doing.

## 2023-12-11 NOTE — Progress Notes (Signed)
 Subjective:     Patient ID: Tracey Saunders, female    DOB: Aug 13, 1955, 68 y.o.   MRN: [id number]  Chief Complaint  Patient presents with   Pain    Pain in neck, bilateral arms and back that has been going on for a while   Medical Management of Chronic Issues    Follow-up with blood work Marvell Slider 4 days ago    HPI Discussed the use of AI scribe software for clinical note transcription with the patient, who gave verbal consent to proceed.  History of Present Illness Tracey Saunders is a 68 year old female with depression and chronic pain who presents with worsening mood symptoms and neck pain after a recent fall.  She has been experiencing a particularly difficult month due to losing her transportation, which has been a significant source of stress. She feels overwhelmed when multiple issues arise in a day, leading to difficulty thinking or talking. She is currently taking Wellbutrin  and Zoloft  for her mood, but feels that the Wellbutrin  is not effectively managing these overwhelming feelings. No thoughts of suicide, but she describes feeling overwhelmed to the point of 'crawling in a hole.'  She has been experiencing pain in her neck, arms, and back, with a history of a pinched nerve in her neck confirmed by an x-ray. Recently, she fell while standing at a puzzle table, exacerbating her neck pain, particularly on the left side, although both sides are affected. The fall occurred last Saturday, and she has not yet seen an orthopedic specialist since then. The pain is severe and sometimes causes numbness in her right arm-old.  More, turned, off balance, and "gracefully" went to floor.  Not hit head  She has a history of difficulty holding things in her right hand, which has worsened over the years. She is careful when opening or carrying items. She is not using propranolol  as it did not help her, and she is not taking pilocarpine  or meloxicam  as they were ineffective.  She continues to take trazodone   for sleep, gabapentin  for pain, furosemide  for swelling, Flonase , Nexium  for stomach issues, Astelin  nose spray, Lipitor for cholesterol, and amlodipine  for blood pressure.  No chest pain or shortness of breath.  Her blood pressure -not checking.  Taking amlodipine  5mg  and lasix . HLD-taking lipitor 40mg  daily . She is curious about her blood work results, particularly regarding her kidneys and liver.    Health Maintenance Due  Topic Date Due   Hepatitis C Screening  Never done   Lung Cancer Screening  Never done   Medicare Annual Wellness (AWV)  01/24/2024    Past Medical History:  Diagnosis Date   Anxiety disorder 09/09/2019   B12 deficiency    Cancer (HCC)    cervical cancer   Depression    Diverticulitis    Fatty liver    GERD (gastroesophageal reflux disease)    Hyperlipidemia    Hypertension    Insomnia 09/09/2019   Liver cyst    Vitamin D  deficiency     Past Surgical History:  Procedure Laterality Date   ABDOMINAL HYSTERECTOMY     COLON RESECTION     due to diverticulitis   SHOULDER ARTHROSCOPY WITH ROTATOR CUFF REPAIR Right 11/15/2022   Procedure: RIGHT SHOULDER ARTHROSCOPY WITH BICEPS TENODESIS;  Surgeon: Wilhelmenia Harada, MD;  Location: [facility];  Service: Orthopedics;  Laterality: Right;     Current Outpatient Medications:    amLODipine  (NORVASC ) 5 MG tablet, Take 1 tablet by mouth once  daily, Disp: 90 tablet, Rfl: 1   atorvastatin  (LIPITOR) 40 MG tablet, Take 1 tablet (40 mg total) by mouth daily., Disp: 90 tablet, Rfl: 3   azelastine  (ASTELIN ) 0.1 % nasal spray, Place 2 sprays into both nostrils 2 (two) times daily. Use in each nostril as directed, Disp: 30 mL, Rfl: 5   Blood Pressure KIT, 1 each by Does not apply route once a week., Disp: 1 kit, Rfl: 0   buPROPion  (WELLBUTRIN  XL) 150 MG 24 hr tablet, Take 1 tablet (150 mg total) by mouth daily., Disp: 90 tablet, Rfl: 1   diazepam  (VALIUM ) 5 MG tablet, Take one tablet by mouth with light food  one hour prior to procedure., Disp: 1 tablet, Rfl: 0   fluticasone  (FLONASE ) 50 MCG/ACT nasal spray, Place 2 sprays into both nostrils daily., Disp: 16 g, Rfl: 6   furosemide  (LASIX ) 20 MG tablet, TAKE 1 TABLET TWICE A DAY, Disp: 180 tablet, Rfl: 3   gabapentin  (NEURONTIN ) 300 MG capsule, TAKE 2 CAPSULES 3 TIMES A  DAY, Disp: 270 capsule, Rfl: 1   Humidifiers MISC, Use as needed to prevent dry mouth, Disp: 1 each, Rfl: 0   esomeprazole  (NEXIUM ) 40 MG capsule, Take 1 capsule (40 mg total) by mouth daily., Disp: 90 capsule, Rfl: 1   potassium chloride  SA (KLOR-CON  M20) 20 MEQ tablet, Take 1 tablet (20 mEq total) by mouth 2 (two) times daily., Disp: 180 tablet, Rfl: 1   sertraline  (ZOLOFT ) 50 MG tablet, Take 3 tablets (150 mg total) by mouth daily., Disp: 135 tablet, Rfl: 3   traZODone  (DESYREL ) 100 MG tablet, Take 1 tablet (100 mg total) by mouth at bedtime as needed for sleep. Please note, did larger dose so only 1 tab, Disp: 90 tablet, Rfl: 3  Current Facility-Administered Medications:    cyanocobalamin  (VITAMIN B12) injection 1,000 mcg, 1,000 mcg, Intramuscular, Once, Christel Cousins, MD   diclofenac  Sodium (VOLTAREN ) 1 % topical gel 4 g, 4 g, Topical, QID, Corey, Evan S, MD   ketorolac  (TORADOL ) injection 60 mg, 60 mg, Intramuscular, Once,   Allergies  Allergen Reactions   Lisinopril  Swelling   Losartan  Swelling   Pseudoephedrine-Naproxen Na Er     Other Reaction(s): Other (See Comments). Swelling reported in multiple joints   ROS neg/noncontributory except as noted HPI/below      Objective:      BP 99/60   Pulse 69   Temp (!) 97.5 F (36.4 C) (Temporal)   Resp 18   Ht 5\' 1"  (1.549 m)   Wt 134 lb 6 oz (61 kg)   SpO2 100%   BMI 25.39 kg/m  Wt Readings from Last 3 Encounters:  12/11/23 134 lb 6 oz (61 kg)  09/25/23 133 lb (60.3 kg)  09/15/23 137 lb (62.1 kg)    Physical Exam   Gen: WDWN NAD HEENT: NCAT, conjunctiva not injected, sclera nonicteric NECK:  supple, no  thyromegaly, no nodes, no carotid bruits.  Tender muscles L and R neck CARDIAC: RRR, S1S2+, no murmur. DP 2+B LUNGS: CTAB. No wheezes ABDOMEN:  BS+, soft, NTND, No HSM, no masses EXT:  no edema MSK: no gross abnormalities.  NEURO: A&O x3.  CN II-XII intact.  PSYCH: normal mood. Good eye contact     Assessment & Plan:  Cervicalgia -     Ketorolac  Tromethamine   Essential hypertension -     CBC with Differential/Platelet -     Comprehensive metabolic panel with GFR -     Magnesium  -  TSH  Pure hypercholesterolemia  Vitamin B12 deficiency -     Vitamin B12 -     Cyanocobalamin   Vitamin D  deficiency  Recurrent major depressive disorder, in full remission (HCC)  Generalized anxiety disorder -     Sertraline  HCl; Take 3 tablets (150 mg total) by mouth daily.  Dispense: 135 tablet; Refill: 3 -     traZODone  HCl; Take 1 tablet (100 mg total) by mouth at bedtime as needed for sleep. Please note, did larger dose so only 1 tab  Dispense: 90 tablet; Refill: 3  Gastroesophageal reflux disease without esophagitis -     Esomeprazole  Magnesium ; Take 1 capsule (40 mg total) by mouth daily.  Dispense: 90 capsule; Refill: 1  Other orders -     Potassium Chloride  Crys ER; Take 1 tablet (20 mEq total) by mouth 2 (two) times daily.  Dispense: 180 tablet; Refill: 1  Assessment and Plan Assessment & Plan Cervical radiculopathy   Chronic cervical radiculopathy has worsened following a fall, causing bilateral neck pain with new left-sided pain, and continued right arm numbness and weakness. An X-ray confirmed a pinched nerve from ortho. She is awaiting an orthopedic specialist follow-up on June 25rd for further evaluation of her spine and neck.  Chronic pain   Chronic pain has intensified due to the recent fall, affecting her neck, arms, and back. Previous medications, including meloxicam  and prednisone , were ineffective. A Toradol  shot is considered for short-term relief if  needed.  Depression   Depression becomes overwhelming during stress. Current medications include Wellbutrin  and Zoloft , but Wellbutrin  is not providing adequate relief. There is no suicidal ideation. Increase Wellbutrin  to 300 mg daily by taking two 150 mg pills until the new prescription is filled. Ensure the sertraline  (Zoloft ) prescription is up to date. And renewed trazodone   Hypertension   Hypertension is well-controlled with amlodipine . Propranolol  has been discontinued due to lack of efficacy for ha. Continue amlodipine  for blood pressure management   Gastroesophageal reflux disease (GERD)   GERD symptoms are managed with Nexium . There is no dysphagia or food impaction. Refill the Nexium  prescription for ongoing management.  Edema   Edema has significantly improved with furosemide  taken twice daily. Continue furosemide  .  Allergic rhinitis   Allergic rhinitis was previously managed with Xyzal  and Astelin , but she is currently not taking Xyzal . Continue Astelin  nasal spray as needed for allergy symptoms.    Return in about 3 months (around 03/12/2024) for chronic follow-up.  Ellsworth Haas, MD

## 2023-12-11 NOTE — Progress Notes (Signed)
 Labs look great.

## 2023-12-12 ENCOUNTER — Other Ambulatory Visit (HOSPITAL_COMMUNITY): Payer: Self-pay

## 2023-12-12 ENCOUNTER — Telehealth: Payer: Self-pay | Admitting: Pharmacy Technician

## 2023-12-12 NOTE — Telephone Encounter (Signed)
 Pharmacy Patient Advocate Encounter  Received notification from [facility] that Prior Authorization for Sertraline  HCl 50MG  tablets has been APPROVED from 07/12/2023 to 07/10/2024. Unable to obtain price due to refill too soon rejection, last fill date 12/15/2023 next available fill date 01/14/2024   PA #/Case ID/Reference #: [id number]

## 2023-12-12 NOTE — Telephone Encounter (Signed)
 Pharmacy Patient Advocate Encounter   Received notification from CoverMyMeds that prior authorization for Sertraline  HCl 50MG  tablets is required/requested.   Insurance verification completed.   The patient is insured through [facility] .   Per test claim: PA required; PA submitted to above mentioned insurance via CoverMyMeds Key/confirmation #/EOC [id number] Status is pending

## 2023-12-28 ENCOUNTER — Other Ambulatory Visit: Payer: Self-pay | Admitting: Family Medicine

## 2023-12-28 DIAGNOSIS — F411 Generalized anxiety disorder: Secondary | ICD-10-CM

## 2024-01-03 ENCOUNTER — Other Ambulatory Visit (INDEPENDENT_AMBULATORY_CARE_PROVIDER_SITE_OTHER): Payer: Self-pay

## 2024-01-03 ENCOUNTER — Ambulatory Visit: Admitting: Orthopedic Surgery

## 2024-01-03 VITALS — BP 140/82 | HR 67 | Ht 61.0 in | Wt 135.0 lb

## 2024-01-03 DIAGNOSIS — M542 Cervicalgia: Secondary | ICD-10-CM | POA: Diagnosis not present

## 2024-01-03 NOTE — Progress Notes (Signed)
 Orthopedic Spine Surgery Office Note  Assessment: Patient is a 68 y.o. female with 2 issues:  1) right shoulder pain that is aggravated with motion at the shoulder, screamed in pain without any resistance during deltoid strength testing 2) cervical radiculopathy   Plan: -I told the patient I feel that there are 2 issues going on based on her history and her exam.  She has significant shoulder pain that is aggravated with motion at the shoulder.  She also has numbness and pain radiating into the arm that is consistent with cervical radiculopathy.  Her bigger issue right now seems to be the shoulder pain as she says she cannot move the shoulder without being in terrible pain.  I told her that I do not think that any kind of cervical spine surgery would help with that severe shoulder pain based on her exam.  She also did not get any relief of the shoulder pain with a cervical injection. -I told her a ACDF could help her with the radiating arm pain and numbness.  However, she is currently a nicotine user so she would not be a candidate.  She also has Community education officer which per their policy would require her to do 6 weeks of physical therapy and have a negative nicotine test prior to any elective spine surgery -Patient should return to office on an as-needed basis   Patient expressed understanding of the plan and all questions were answered to the patient's satisfaction.   ___________________________________________________________________________   History:  Patient is a 68 y.o. female who presents today for cervical spine.  Patient has had over a year of right shoulder pain.  She underwent a surgery with Dr. Genelle for this issue.  She said she did not notice any significant relief after the surgery.  She still has significant pain in her right shoulder.  She is also noticed pain in her neck and pain radiating down her lateral arm to the level of the elbow.  She notes numbness in the same distribution along  the lateral aspect of the arm.  She notes her shoulder pain is worse with motion at the shoulder.  She has difficulty with doing any overhead activities as a result of the shoulder pain.  She has no pain rating into the left upper extremity.  There is no trauma or injury that preceded the onset of this pain.   Weakness: Denies Difficulty with fine motor skills (e.g., buttoning shirts, handwriting): Denies Symptoms of imbalance: Denies Paresthesias and numbness: Yes, has decreased sensation over the lateral aspect of her right arm.  No other numbness or paresthesias Bowel or bladder incontinence: Denies Saddle anesthesia: Denies  Treatments tried: gabapentin , oral steroids, cervical ESI  Review of systems: Denies fevers and chills, night sweats, unexplained weight loss. Has a history of cervical cancer.  Has had pain that wakes her at night  Past medical history: History of cervical cancer Vit D deficiency HTN HLD Anxiety/depression Diverticulitis  GERD  Allergies: lisinopril , losartan , naproxen  Past surgical history:  Right shoulder rotator cuff repair Colon resection Hysterectomy  Social history: Reports use of nicotine product (smoking, vaping, patches, smokeless) Alcohol use: denies Denies recreational drug use   Physical Exam:  BMI of 25.5  General: no acute distress, appears stated age Neurologic: alert, answering questions appropriately, following commands Respiratory: unlabored breathing on room air, symmetric chest rise Psychiatric: appropriate affect, normal cadence to speech   MSK (spine):  -Strength exam      Left  Right Grip strength  5/5  5/5 Interosseus   5/5   5/5 Wrist extension  5/5  5/5 Wrist flexion   5/5  5/5 Elbow flexion   5/5  4/5 Deltoid    5/5  3/5  Unable to perform right proximal musculature strength testing due to pain  -Sensory exam    Sensation intact to light touch in C5-T1 nerve distributions of bilateral  upper extremities  -Spurling: negative bilaterally -Hoffman sign: negative bilaterally -Clonus: no beats bilaterally -Interosseous wasting: none seen -Grip and release test: negative -Romberg: negative -Gait: normal  Left shoulder exam: no pain through range of motion Right shoulder exam: pain with more than 30 degree arc of motion at the shoulder, pain jobe testing and weakness but it seems limited by pain  Imaging: XRs of the cervical spine from 01/03/2024 were independently reviewed and interpreted, showing disc height loss at C4/5 and C5/6. No other significant degenerative changes seen. No evidence of instability on flexion/extension views.   MRI of the cervical spine from 03/17/2023 was independently reviewed and interpreted, showing right sided foraminal stenosis at C3/4. Bilateral foraminal stenosis at C4/5. Bilateral foraminal stenosis at C5/6. No significant central stenosis seen. No T2 cord signal change seen.    Patient name: Tracey Saunders Patient MRN: [id number] Date of visit: 01/03/24

## 2024-01-10 ENCOUNTER — Telehealth: Payer: Self-pay | Admitting: *Deleted

## 2024-01-10 NOTE — Telephone Encounter (Signed)
 Copied from CRM [id number]. Topic: Clinical - Medication Question >> Jan 10, 2024  3:14 PM Shereese L wrote: Reason for RMF:[id number] stated that she can't take the gabapentin  (NEURONTIN ) 300 MG capsule because it leaves her all foggy but she needs something for the pain and needs for the office to give her a call  Tried calling patient, unable to leave a message, mailbox full.

## 2024-01-10 NOTE — Telephone Encounter (Signed)
 Patient stated that she want to stop taking the Gabapentin  for pain and replace it with something else due to side effects, per patient: it is messing with my head.

## 2024-01-13 DIAGNOSIS — G35 Multiple sclerosis: Secondary | ICD-10-CM | POA: Diagnosis not present

## 2024-01-15 ENCOUNTER — Encounter: Payer: Self-pay | Admitting: Family Medicine

## 2024-01-15 ENCOUNTER — Other Ambulatory Visit: Payer: Self-pay | Admitting: Family Medicine

## 2024-01-15 NOTE — Telephone Encounter (Signed)
 Patient stated that she stopped taking gabapentin  and lyrica, both due to side effects. Lyrica made her mouth really dry and the gabapentin  messed with her head. Patient declines referral to pain management. Pharmacy is [facility].

## 2024-01-16 ENCOUNTER — Encounter: Payer: Self-pay | Admitting: *Deleted

## 2024-01-16 NOTE — Telephone Encounter (Signed)
 Patient notified of message below.

## 2024-01-25 ENCOUNTER — Other Ambulatory Visit: Payer: Self-pay | Admitting: Family Medicine

## 2024-02-05 ENCOUNTER — Ambulatory Visit (HOSPITAL_BASED_OUTPATIENT_CLINIC_OR_DEPARTMENT_OTHER): Admitting: Orthopaedic Surgery

## 2024-02-05 DIAGNOSIS — M25511 Pain in right shoulder: Secondary | ICD-10-CM

## 2024-02-05 DIAGNOSIS — M7521 Bicipital tendinitis, right shoulder: Secondary | ICD-10-CM | POA: Diagnosis not present

## 2024-02-05 MED ORDER — LIDOCAINE HCL 1 % IJ SOLN
4.0000 mL | INTRAMUSCULAR | Status: AC | PRN
  Administered 2024-02-05: 4 mL

## 2024-02-05 MED ORDER — TRIAMCINOLONE ACETONIDE 40 MG/ML IJ SUSP
80.0000 mg | INTRAMUSCULAR | Status: AC | PRN
  Administered 2024-02-05: 80 mg via INTRA_ARTICULAR

## 2024-02-05 NOTE — Progress Notes (Signed)
 Post Operative Evaluation     Interval History:   Presents today for follow-up of her right shoulder.  Overall she has been feeling somewhat better although still having tenderness and pain that radiates down the lateral aspect of the shoulder   PMH/PSH/Family History/Social History/Meds/Allergies:    Past Medical History:  Diagnosis Date  . Anxiety disorder 09/09/2019  . B12 deficiency   . Cancer (HCC)    cervical cancer  . Depression   . Diverticulitis   . Fatty liver   . GERD (gastroesophageal reflux disease)   . Hyperlipidemia   . Hypertension   . Insomnia 09/09/2019  . Liver cyst   . Vitamin D  deficiency    Past Surgical History:  Procedure Laterality Date  . ABDOMINAL HYSTERECTOMY    . COLON RESECTION     due to diverticulitis  . SHOULDER ARTHROSCOPY WITH ROTATOR CUFF REPAIR Right 11/15/2022   Procedure: RIGHT SHOULDER ARTHROSCOPY WITH BICEPS TENODESIS;  Surgeon: Genelle Standing, MD;  Location: [facility];  Service: Orthopedics;  Laterality: Right;   Social History   Socioeconomic History  . Marital status: Single    Spouse name: Not on file  . Number of children: 1  . Years of education: Not on file  . Highest education level: Not on file  Occupational History  . Occupation: retired  . Occupation: retired  Tobacco Use  . Smoking status: Light Smoker    Current packs/day: 0.50    Types: Cigarettes  . Smokeless tobacco: Never  . Tobacco comments:    Smokes 3 cigarettes a day   Vaping Use  . Vaping status: Never Used  Substance and Sexual Activity  . Alcohol use: Not Currently    Comment: occasional beer or bloody mary once a week  . Drug use: Never  . Sexual activity: Not on file  Other Topics Concern  . Not on file  Social History Narrative   Are you right handed or left handed? Right    Are you currently employed ? Retired   What is your current occupation?   Do you live at home alone? Y   Who  lives with you?    What type of home do you live in: 1 story or 2 story?        Social Drivers of Corporate investment banker Strain: Low Risk  (01/24/2023)   Overall Financial Resource Strain (CARDIA)   . Difficulty of Paying Living Expenses: Not hard at all  Food Insecurity: No Food Insecurity (01/24/2023)   Hunger Vital Sign   . Worried About Programme researcher, broadcasting/film/video in the Last Year: Never true   . Ran Out of Food in the Last Year: Never true  Transportation Needs: No Transportation Needs (01/24/2023)   PRAPARE - Transportation   . Lack of Transportation (Medical): No   . Lack of Transportation (Non-Medical): No  Physical Activity: Sufficiently Active (01/24/2023)   Exercise Vital Sign   . Days of Exercise per Week: 5 days   . Minutes of Exercise per Session: 30 min  Stress: No Stress Concern Present (01/24/2023)   [employer] of Occupational Health - Occupational Stress Questionnaire   . Feeling of Stress : Not at all  Social Connections: Socially Isolated (01/24/2023)   Social Connection and Isolation Panel   .  Frequency of Communication with Friends and Family: More than three times a week   . Frequency of Social Gatherings with Friends and Family: Once a week   . Attends Religious Services: Never   . Active Member of Clubs or Organizations: No   . Attends Banker Meetings: Never   . Marital Status: Never married   Family History  Problem Relation Age of Onset  . Stroke Mother   . Colon cancer Neg Hx   . Esophageal cancer Neg Hx   . Stomach cancer Neg Hx   . Rectal cancer Neg Hx   . Sleep apnea Neg Hx    Allergies  Allergen Reactions  . Gabapentin  Other (See Comments)    Messed w/my head  . Lisinopril  Swelling  . Losartan  Swelling  . Pseudoephedrine-Naproxen Na Er     Other Reaction(s): Other (See Comments). Swelling reported in multiple joints  . Lyrica [Pregabalin] Other (See Comments)    Dry mouth   Current Outpatient Medications  Medication  Sig Dispense Refill  . amLODipine  (NORVASC ) 5 MG tablet Take 1 tablet by mouth once daily 90 tablet 1  . atorvastatin  (LIPITOR) 40 MG tablet Take 1 tablet (40 mg total) by mouth daily. 90 tablet 3  . azelastine  (ASTELIN ) 0.1 % nasal spray Place 2 sprays into both nostrils 2 (two) times daily. Use in each nostril as directed 30 mL 5  . Blood Pressure KIT 1 each by Does not apply route once a week. 1 kit 0  . buPROPion  (WELLBUTRIN  XL) 150 MG 24 hr tablet TAKE 1 TABLET DAILY 90 tablet 3  . diazepam  (VALIUM ) 5 MG tablet Take one tablet by mouth with light food one hour prior to procedure. 1 tablet 0  . esomeprazole  (NEXIUM ) 40 MG capsule Take 1 capsule (40 mg total) by mouth daily. 90 capsule 1  . fluticasone  (FLONASE ) 50 MCG/ACT nasal spray Place 2 sprays into both nostrils daily. 16 g 6  . furosemide  (LASIX ) 20 MG tablet TAKE 1 TABLET TWICE A DAY 180 tablet 3  . Humidifiers MISC Use as needed to prevent dry mouth 1 each 0  . potassium chloride  SA (KLOR-CON  M20) 20 MEQ tablet Take 1 tablet (20 mEq total) by mouth 2 (two) times daily. 180 tablet 1  . sertraline  (ZOLOFT ) 50 MG tablet TAKE 1 AND 1/2 TABLETS AT  BEDTIME 135 tablet 3  . traZODone  (DESYREL ) 100 MG tablet Take 1 tablet (100 mg total) by mouth at bedtime as needed for sleep. Please note, did larger dose so only 1 tab 90 tablet 3   Current Facility-Administered Medications  Medication Dose Route Frequency Provider Last Rate Last Admin  . diclofenac  Sodium (VOLTAREN ) 1 % topical gel 4 g  4 g Topical QID Joane Artist RAMAN, MD       No results found.  Review of Systems:   A ROS was performed including pertinent positives and negatives as documented in the HPI.   Musculoskeletal Exam:    There were no vitals taken for this visit.  Incisions are well-appearing without erythema or drainage.  Forward elevation is to 170 degrees equal to the contralateral side.  No tenderness over the biceps tendon.  There is some bruising about  shoulder  Positive Spurling maneuver on the right with radiation down the right arm.  Otherwise intact strength right upper extremity.  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   Assessment:   68 year old female with evidence of right shoulder cervical radiculopathy as well as rotator cuff tendinitis.  Given this I have recommended ultrasound-guided injection of the right shoulder.  Will plan to proceed with this. Plan :    - Right shoulder subacromial injection provided after verbal consent obtained    Procedure Note  Patient: Tracey Saunders             Date of Birth: 1955-07-26           MRN: [id number]             Visit Date: 02/05/2024  Procedures: Visit Diagnoses: No diagnosis found.  Large Joint Inj: R subacromial bursa on 02/05/2024 1:31 PM Indications: pain Details: 22 G 1.5 in needle, ultrasound-guided anterior approach  Arthrogram: No  Medications: 4 mL lidocaine  1 %; 80 mg triamcinolone  acetonide 40 MG/ML Outcome: tolerated well, no immediate complications Procedure, treatment alternatives, risks and benefits explained, specific risks discussed. Consent was given by the patient. Immediately prior to procedure a time out was called to verify the correct patient, procedure, equipment, support staff and site/side marked as required. Patient was prepped and draped in the usual sterile fashion.          I personally saw and evaluated the patient, and participated in the management and treatment plan.  Elspeth Parker, MD Attending Physician, Orthopedic Surgery  This document was dictated using Dragon voice recognition software. A reasonable attempt at proof reading has been made to minimize errors.

## 2024-02-06 ENCOUNTER — Telehealth: Payer: Self-pay | Admitting: Family Medicine

## 2024-02-06 NOTE — Telephone Encounter (Signed)
 Copied from CRM #[id number]. Topic: Appointments - Scheduling Inquiry for Clinic >> Feb 05, 2024 12:38 PM Lavanda D wrote: Reason for CRM: Patient is calling to schedule her annual physical/labs, first available populating as November but patient said it should not be that far out. No past physical in appointment desk, could someone call back to assist with scheduling this appointment?  LVM to schedule CPE. Informed that provider is booked out to November.

## 2024-02-12 DIAGNOSIS — Z1379 Encounter for other screening for genetic and chromosomal anomalies: Secondary | ICD-10-CM | POA: Diagnosis not present

## 2024-02-19 DIAGNOSIS — E119 Type 2 diabetes mellitus without complications: Secondary | ICD-10-CM | POA: Diagnosis not present

## 2024-03-01 ENCOUNTER — Ambulatory Visit: Payer: Self-pay

## 2024-03-01 NOTE — Telephone Encounter (Signed)
 See message below and advise. Patient stated she cannot come in until 03/13/24 due to finances and transportation.

## 2024-03-01 NOTE — Telephone Encounter (Signed)
 FYI Only or Action Required?: FYI only for provider.  Patient was last seen in primary care on 12/11/2023 by Tracey Jenkins Jansky, MD.  Called Nurse Triage reporting Insomnia.  Symptoms began several months ago.  Interventions attempted: Prescription medications: Sleep aids.  Symptoms are: stable.  Triage Disposition: See PCP Within 2 Weeks  Patient/caregiver understands and will follow disposition?: Yes                 Copied from CRM [id number]. Topic: Clinical - Red Word Triage >> Mar 01, 2024 12:17 PM [name] wrote: [facility] that prompted transfer to Nurse Triage: Pt called in and now not responding Reason for Disposition  [1] Insomnia lasts > 2 weeks AND [2] no improvement after using Care Advice  Answer Assessment - Initial Assessment Questions Patient states her prescription sleep medicine is not working. Requesting to have medications changed back.   1. DESCRIPTION: Tell me about your sleeping problem. (e.g., waking frequently during night, sleeping during day and awake at night, trouble falling asleep) How bad is it?   Difficulty falling asleep       2. ONSET: How long have you been having trouble sleeping? (e.g., days, weeks, months; longstanding sleep problems)     Last 2 months  3. DAYTIME SLEEP PATTERN: How much time do you spend sleeping or napping during the day?     No, patient states she stays up 2 days at a time.  4. STRESSORS: Is there anything that is making you feel stressed? Is there something that worries you?     Patient states she has a lot of stress.  5. PAIN: Do you have any pain that is keeping you awake? (e.g., back pain, joint pain) If Yes, ask How bad is the pain? (e.g., scale 0-10; mild, moderate, severe).     No 6. CAFFEINE: Do you drink caffeinated beverages? If Yes, ask How much each day? (e.g., coffee, tea, colas)     Drinks tea during the day, 1-2 cups of coffee in the monring.  8. MEDICINE CHANGE: Has there  been any recent change in medicines? (e.g., new medicine started, stopped, or dose changed).      Yes, states her provider changed her medications around 9. TREATMENT: What have you done so far to treat this sleep problem? (e.g., prescription or OTC sleep medicines, herbal or dietary supplements, cannabis, relaxation strategies)     States she has tried other things before and they did not work. Would not say what she has tried in the past.  10. OTHER SYMPTOMS: Do you have any other symptoms?  (e.g., difficulty breathing)       Throat feels raw, has had bruising on her arm when hitting her arms on things around the house.  Protocols used: Insomnia-A-AH

## 2024-03-03 ENCOUNTER — Other Ambulatory Visit: Payer: Self-pay | Admitting: Family Medicine

## 2024-03-03 MED ORDER — DOXEPIN HCL 3 MG PO TABS: 3.0000 mg | ORAL_TABLET | Freq: Every evening | ORAL | 1 refills | Status: DC | PRN

## 2024-03-04 NOTE — Telephone Encounter (Signed)
 Patient notified of message below and verbalized understanding. Patient stated she is having trouble falling asleep.

## 2024-03-06 ENCOUNTER — Emergency Department (HOSPITAL_COMMUNITY)

## 2024-03-06 ENCOUNTER — Encounter (HOSPITAL_COMMUNITY): Payer: Self-pay

## 2024-03-06 ENCOUNTER — Emergency Department (HOSPITAL_COMMUNITY)
Admission: EM | Admit: 2024-03-06 | Discharge: 2024-03-07 | Disposition: A | Discharge status: Home / Self Care | Attending: Emergency Medicine | Admitting: Emergency Medicine

## 2024-03-06 ENCOUNTER — Other Ambulatory Visit: Payer: Self-pay

## 2024-03-06 DIAGNOSIS — M51362 Other intervertebral disc degeneration, lumbar region with discogenic back pain and lower extremity pain: Secondary | ICD-10-CM | POA: Insufficient documentation

## 2024-03-06 DIAGNOSIS — M5431 Sciatica, right side: Secondary | ICD-10-CM | POA: Insufficient documentation

## 2024-03-06 DIAGNOSIS — I10 Essential (primary) hypertension: Secondary | ICD-10-CM | POA: Insufficient documentation

## 2024-03-06 DIAGNOSIS — Z79899 Other long term (current) drug therapy: Secondary | ICD-10-CM | POA: Diagnosis not present

## 2024-03-06 DIAGNOSIS — M545 Low back pain, unspecified: Secondary | ICD-10-CM | POA: Diagnosis present

## 2024-03-06 MED ORDER — METHYLPREDNISOLONE 4 MG PO TBPK: ORAL_TABLET | ORAL | 0 refills | Status: DC

## 2024-03-06 MED ORDER — LIDOCAINE 5 % EX PTCH
1.0000 | MEDICATED_PATCH | CUTANEOUS | Status: DC
  Administered 2024-03-06: 1 via TRANSDERMAL
  Filled 2024-03-06: qty 1

## 2024-03-06 MED ORDER — CYCLOBENZAPRINE HCL 10 MG PO TABS: 10.0000 mg | ORAL_TABLET | Freq: Two times a day (BID) | ORAL | 0 refills | Status: DC | PRN

## 2024-03-06 MED ORDER — LIDOCAINE 5 % EX PTCH: 1.0000 | MEDICATED_PATCH | CUTANEOUS | 0 refills | Status: DC

## 2024-03-06 MED ORDER — OXYCODONE-ACETAMINOPHEN 5-325 MG PO TABS: 1.0000 | ORAL_TABLET | Freq: Four times a day (QID) | ORAL | 0 refills | Status: DC | PRN

## 2024-03-06 MED ORDER — OXYCODONE-ACETAMINOPHEN 5-325 MG PO TABS
1.0000 | ORAL_TABLET | Freq: Once | ORAL | Status: AC
  Administered 2024-03-06: 1 via ORAL
  Filled 2024-03-06: qty 1

## 2024-03-06 MED ORDER — CYCLOBENZAPRINE HCL 10 MG PO TABS
5.0000 mg | ORAL_TABLET | Freq: Once | ORAL | Status: AC
  Administered 2024-03-06: 5 mg via ORAL
  Filled 2024-03-06: qty 1

## 2024-03-06 MED ORDER — HYDROMORPHONE HCL 1 MG/ML IJ SOLN
1.0000 mg | Freq: Once | INTRAMUSCULAR | Status: AC
  Administered 2024-03-06: 1 mg via INTRAVENOUS
  Filled 2024-03-06: qty 1

## 2024-03-06 NOTE — ED Provider Triage Note (Cosign Needed Addendum)
 Emergency Medicine Provider Triage Evaluation Note  Tracey Saunders , a 68 y.o. female  was evaluated in triage.  Pt complains of low back pain and R leg pain. So painful she states that she cannot walk or sleep. Hx of chronic back pain. States that this is much more severe than normal. Patient states she is ambulatory with severe pain. States she was previously taking Gabapentin  but was not helping, she also states she had a reaction to it. MRI back in 2023 of lumbar spine. Recent outpatient labs completed 2 months ago. Denies bowel/bladder symptoms states normal urination and normal bowel movements, no numbness of groin.   Review of Systems  Positive: Lumbar back pain, R leg pain  Negative: Fever, chills, chest pain, shortness of breath  Physical Exam  BP (!) 134/109   Pulse (!) 109   Temp 98.2 Saunders (36.8 C)   Resp 18   Ht 5' 1 (1.549 m)   Wt 61.2 kg   SpO2 98%   BMI 25.51 kg/m  Gen:   Awake, no distress   Resp:  Normal effort  MSK:   Pain with palpation of lumbar spine worse on R side, bilateral lower extremities neurovascularly intact, reduced ROM of R leg due to pain Other:    Medical Decision Making  Medically screening exam initiated at 3:10 PM.  Appropriate orders placed.  Tracey Saunders was informed that the remainder of the evaluation will be completed by another provider, this initial triage assessment does not replace that evaluation, and the importance of remaining in the ED until their evaluation is complete.  Orders: CT lumbar spine, percocet   Tracey Terrall FALCON, PA-C 03/06/24 1520    Tracey Harpham F, PA-C 03/06/24 1521

## 2024-03-06 NOTE — ED Notes (Signed)
 Reports pain started in right thigh and now goes into her back and she can't do anything for herself due to the pain

## 2024-03-06 NOTE — Discharge Instructions (Addendum)
 Your symptoms are consistent with sciatica for which medication has been prescribed.  Do not operate heavy machinery while taking opiate pain medication or muscle relaxants.  Follow-up with your PCP.  Your CT imaging showed the following: FINDINGS:  Segmentation: 5 lumbar type vertebrae.    Alignment: Minimal levoscoliosis.    Vertebrae: No acute fracture or focal pathologic process.    Paraspinal and other soft tissues: No acute finding. Aortic  atherosclerosis    Disc levels:    At L1-L2, patent disc space.  No canal stenosis.  Patent foramen.    At L2-L3, disc space narrowing. Diffuse disc bulge. Mild ligamentum  flavum thickening. Mild bilateral facet degenerative changes. No  bony foraminal narrowing. Possible mild canal stenosis.    At L3-L4, disc space narrowing. Mild diffuse and left  subarticular/foraminal    At L4-L5, patent disc space. Mild diffuse disc bulge. No canal  stenosis. Moderate facet degenerative change in mild ligamentum  flavum thickening. Grossly patent foramen.    At L5-S1, disc space narrowing. Moderate bilateral facet  degenerative changes. Patent foramen. No significant canal stenosis.  Disc bulge. Mild ligamentum flavum thickening and facet degenerative  change. No high-grade canal stenosis. Patent appearing foramen.    IMPRESSION:  1. No acute osseous abnormality.  2. Multilevel degenerative changes as described above.  3. Aortic atherosclerosis.    Aortic Atherosclerosis (ICD10-I70.0).

## 2024-03-06 NOTE — ED Triage Notes (Signed)
 Pt endorses back pain and leg pain for a few days. States the pain is so bad that she cannot walk or sleep. Right leg felt like it was giving out and then back pain started. Denies any injury.

## 2024-03-06 NOTE — ED Notes (Signed)
 Patient complaining she is still having pain.  Triage RN and Triage PA notified. Patient received percocet in triage approx 2 hours ago.

## 2024-03-06 NOTE — ED Provider Notes (Signed)
 [place] EMERGENCY DEPARTMENT AT [facility] Provider Note   CSN: [id number] Arrival date & time: 03/06/24  1441     Patient presents with: Back Pain and Leg Pain   Tracey Saunders is a 68 y.o. female.    Back Pain Associated symptoms: leg pain   Leg Pain Associated symptoms: back pain      68 year old female with medical history significant for HTN, HLD, GERD, anxiety, diverticulitis, depression, presenting to the emergency department with back pain and radiculopathy.  The patient states that she has chronic back pain.  She denies any recent falls or trauma.  She states that over the last few days she had worsening of her back pain but cannot think of anything that she did that particularly injured it.  She endorses pain in the middle of her back with radiation down the right leg, worse with movement and elevation of the leg.  She had an MRI of the lumbar spine back in 2023.  Showed lumbar spinal degeneration, disc bulging with spinal stenosis.  She denies any urinary or fecal incontinence, denies any fevers or chills.  She states the pain is making it difficult to ambulate.  She denies any focal weakness or numbness.  Prior to Admission medications   Medication Sig Start Date End Date Taking? Authorizing Provider  cyclobenzaprine  (FLEXERIL ) 10 MG tablet Take 1 tablet (10 mg total) by mouth 2 (two) times daily as needed for muscle spasms. 03/06/24  Yes Jerrol Agent, MD  lidocaine  (LIDODERM ) 5 % Place 1 patch onto the skin daily. Remove & Discard patch within 12 hours or as directed by MD 03/06/24  Yes Jerrol Agent, MD  oxyCODONE -acetaminophen  (PERCOCET/ROXICET) 5-325 MG tablet Take 1 tablet by mouth every 6 (six) hours as needed for severe pain (pain score 7-10). 03/06/24  Yes Jerrol Agent, MD  amLODipine  (NORVASC ) 5 MG tablet Take 1 tablet by mouth once daily 12/05/23   Wendolyn Jenkins Jansky, MD  atorvastatin  (LIPITOR) 40 MG tablet Take 1 tablet (40 mg total) by mouth daily.  04/24/23   Wendolyn Jenkins Jansky, MD  azelastine  (ASTELIN ) 0.1 % nasal spray Place 2 sprays into both nostrils 2 (two) times daily. Use in each nostril as directed 01/14/22   Jeneal Danita Macintosh, MD  Blood Pressure KIT 1 each by Does not apply route once a week. 05/23/23   Wendolyn Jenkins Jansky, MD  buPROPion  (WELLBUTRIN  XL) 150 MG 24 hr tablet TAKE 1 TABLET DAILY 12/28/23   Wendolyn Jenkins Jansky, MD  diazepam  (VALIUM ) 5 MG tablet Take one tablet by mouth with light food one hour prior to procedure. 11/13/23   Williams, Megan E, NP  Doxepin  HCl 3 MG TABS Take 1 tablet (3 mg total) by mouth at bedtime as needed. 03/03/24   Wendolyn Jenkins Jansky, MD  esomeprazole  (NEXIUM ) 40 MG capsule Take 1 capsule (40 mg total) by mouth daily. 12/11/23   Wendolyn Jenkins Jansky, MD  fluticasone  (FLONASE ) 50 MCG/ACT nasal spray Place 2 sprays into both nostrils daily. 04/06/23   Soldatova, [place], MD  furosemide  (LASIX ) 20 MG tablet TAKE 1 TABLET TWICE A DAY 01/25/24   Wendolyn Jenkins Jansky, MD  Humidifiers MISC Use as needed to prevent dry mouth 03/17/22   Kennyth Worth HERO, MD  potassium chloride  SA (KLOR-CON  M20) 20 MEQ tablet Take 1 tablet (20 mEq total) by mouth 2 (two) times daily. 12/11/23   Wendolyn Jenkins Jansky, MD  sertraline  (ZOLOFT ) 50 MG tablet TAKE 1 AND 1/2 TABLETS AT  BEDTIME 12/28/23   Wendolyn Jenkins Jansky, MD  traZODone  (DESYREL ) 100 MG tablet Take 1 tablet (100 mg total) by mouth at bedtime as needed for sleep. Please note, did larger dose so only 1 tab 12/11/23   Wendolyn Jenkins Jansky, MD    Allergies: Gabapentin , Lisinopril , Losartan , Pseudoephedrine-naproxen na er, and Lyrica [pregabalin]    Review of Systems  Musculoskeletal:  Positive for back pain.  All other systems reviewed and are negative.   Updated Vital Signs BP 116/74   Pulse 66   Temp 98.1 F (36.7 C)   Resp 17   Ht 5' 1 (1.549 m)   Wt 61.2 kg   SpO2 100%   BMI 25.51 kg/m   Physical Exam Vitals and nursing note reviewed.  Constitutional:      General: She is not in acute  distress.    Appearance: She is well-developed.  HENT:     Head: Normocephalic and atraumatic.  Eyes:     Conjunctiva/sclera: Conjunctivae normal.  Cardiovascular:     Rate and Rhythm: Normal rate and regular rhythm.     Heart sounds: No murmur heard. Pulmonary:     Effort: Pulmonary effort is normal. No respiratory distress.     Breath sounds: Normal breath sounds.  Abdominal:     Palpations: Abdomen is soft.     Tenderness: There is no abdominal tenderness.  Musculoskeletal:        General: No swelling.     Cervical back: Neck supple.     Comments: Mild midline tenderness of the lower lumbar spine, positive straight leg raise test on the right  Skin:    General: Skin is warm and dry.     Capillary Refill: Capillary refill takes less than 2 seconds.  Neurological:     Mental Status: She is alert.     Comments: 5 out of 5 strength in the bilateral upper extremities with intact sensation to light touch.  5-5 strength in the bilateral lower extremities with intact sensation to light touch. 2+ prepatellar reflexes bilaterally  Psychiatric:        Mood and Affect: Mood normal.     (all labs ordered are listed, but only abnormal results are displayed) Labs Reviewed - No data to display  EKG: None  Radiology: CT Lumbar Spine Wo Contrast Result Date: 03/06/2024 CLINICAL DATA:  Lumbar spine pain radiating down right leg EXAM: CT LUMBAR SPINE WITHOUT CONTRAST TECHNIQUE: Multidetector CT imaging of the lumbar spine was performed without intravenous contrast administration. Multiplanar CT image reconstructions were also generated. RADIATION DOSE REDUCTION: This exam was performed according to the departmental dose-optimization program which includes automated exposure control, adjustment of the mA and/or kV according to patient size and/or use of iterative reconstruction technique. COMPARISON:  None Available. FINDINGS: Segmentation: 5 lumbar type vertebrae. Alignment: Minimal  levoscoliosis. Vertebrae: No acute fracture or focal pathologic process. Paraspinal and other soft tissues: No acute finding. Aortic atherosclerosis Disc levels: At L1-L2, patent disc space.  No canal stenosis.  Patent foramen. At L2-L3, disc space narrowing. Diffuse disc bulge. Mild ligamentum flavum thickening. Mild bilateral facet degenerative changes. No bony foraminal narrowing. Possible mild canal stenosis. At L3-L4, disc space narrowing. Mild diffuse and left subarticular/foraminal At L4-L5, patent disc space. Mild diffuse disc bulge. No canal stenosis. Moderate facet degenerative change in mild ligamentum flavum thickening. Grossly patent foramen. At L5-S1, disc space narrowing. Moderate bilateral facet degenerative changes. Patent foramen. No significant canal stenosis. Disc bulge. Mild ligamentum flavum thickening and facet  degenerative change. No high-grade canal stenosis. Patent appearing foramen. IMPRESSION: 1. No acute osseous abnormality. 2. Multilevel degenerative changes as described above. 3. Aortic atherosclerosis. Aortic Atherosclerosis (ICD10-I70.0). Electronically Signed   By: Luke Bun M.D.   On: 03/06/2024 16:46     Procedures   Medications Ordered in the ED  lidocaine  (LIDODERM ) 5 % 1 patch (1 patch Transdermal Patch Applied 03/06/24 2106)  oxyCODONE -acetaminophen  (PERCOCET/ROXICET) 5-325 MG per tablet 1 tablet (1 tablet Oral Given 03/06/24 1520)  cyclobenzaprine  (FLEXERIL ) tablet 5 mg (5 mg Oral Given 03/06/24 2104)  oxyCODONE -acetaminophen  (PERCOCET/ROXICET) 5-325 MG per tablet 1 tablet (1 tablet Oral Given 03/06/24 2105)  HYDROmorphone  (DILAUDID ) injection 1 mg (1 mg Intravenous Given 03/06/24 2245)                                    Medical Decision Making Risk Prescription drug management.    68 year old female with medical history significant for HTN, HLD, GERD, anxiety, diverticulitis, depression, presenting to the emergency department with back pain and  radiculopathy.  The patient states that she has chronic back pain.  She denies any recent falls or trauma.  She states that over the last few days she had worsening of her back pain but cannot think of anything that she did that particularly injured it.  She endorses pain in the middle of her back with radiation down the right leg, worse with movement and elevation of the leg.  She had an MRI of the lumbar spine back in 2023.  Showed lumbar spinal degeneration, disc bulging with spinal stenosis.  She denies any urinary or fecal incontinence, denies any fevers or chills.  She states the pain is making it difficult to ambulate.  She denies any focal weakness or numbness.   On arrival, the patient was afebrile, vitally stable, initially tachycardic heart rate 109, improved to the 60s after pain medication, BP 134/109 improved also after pain medication, saturating 98% on room air. On my initial exam, the pt was with an intact neurologic exam, tolerating ambulation with an antalgic gait and p.o. intake without difficulty.  Patient had no abnormal DTRs, no midline spinal tenderness.  Patient endorsing complete sensation of the perineum.  Patient without episodes of fecal or urinary incontinence.  Patient has no focal neurologic deficits and reassuring vital signs at this time.  No obvious physical abnormality or injury on exam. Notably, patient denies recent trauma, is afebrile, and denies IVDU.  Reviewed and confirmed nursing documentation for past medical history, family history, social history.    Initial Assessment:   With the patient's presentation of acute back pain in the above setting, most likely diagnosis is musculoskeletal strain. Other diagnoses were considered including (but not limited to) underlying fracture, epidural hematoma, cauda equina syndrome, spinal stenosis, spinal malignancy. These are considered less likely due to history of present illness and physical exam findings.   In particular,  lack of fever, substantial history of IV drug use, or substantial neurologic abnormality is less consistent with epidural abscess versus discitis or other spinal infection.   Initial Plan:  Multimodal pain control described and patient informed on safe usage.  Screening evaluation including below radiographic evaluation reviewed: IMPRESSION:  1. No acute osseous abnormality.  2. Multilevel degenerative changes as described above.  3. Aortic atherosclerosis.    Aortic Atherosclerosis (ICD10-I70.0).   Patient stable for continued outpatient evaluation and management of their musculoskeletal pains.  Patient  referred back to primary care provider for continued evaluation and management.    Disposition:   The patient was able to successfully ambulate to the restroom. Based on the above findings, I believe patient is stable for discharge.    Patient and family educated about specific return precautions for given chief complaint and symptoms.  Patient and family educated about follow-up with her PCP.  Patient expressed understanding of return precautions and need for follow-up. Patient spoken to regarding all imaging and laboratory results and appropriate follow up for these results. All education provided in verbal and written form and time was allowed for answering of patient questions. Patient discharged.          Emergency Department Medication Summary:   Medications  lidocaine  (LIDODERM ) 5 % 1 patch (1 patch Transdermal Patch Applied 03/06/24 2106)  oxyCODONE -acetaminophen  (PERCOCET/ROXICET) 5-325 MG per tablet 1 tablet (1 tablet Oral Given 03/06/24 1520)  cyclobenzaprine  (FLEXERIL ) tablet 5 mg (5 mg Oral Given 03/06/24 2104)  oxyCODONE -acetaminophen  (PERCOCET/ROXICET) 5-325 MG per tablet 1 tablet (1 tablet Oral Given 03/06/24 2105)  HYDROmorphone  (DILAUDID ) injection 1 mg (1 mg Intravenous Given 03/06/24 2245)         Final diagnoses:  Degeneration of intervertebral disc of lumbar  region with discogenic back pain and lower extremity pain  Sciatica of right side    ED Discharge Orders          Ordered    cyclobenzaprine  (FLEXERIL ) 10 MG tablet  2 times daily PRN        03/06/24 2335    lidocaine  (LIDODERM ) 5 %  Every 24 hours        03/06/24 2335    oxyCODONE -acetaminophen  (PERCOCET/ROXICET) 5-325 MG tablet  Every 6 hours PRN        03/06/24 2335               Jerrol Agent, MD 03/06/24 2336

## 2024-03-07 ENCOUNTER — Ambulatory Visit

## 2024-03-13 ENCOUNTER — Encounter: Payer: Self-pay | Admitting: Family Medicine

## 2024-03-13 ENCOUNTER — Ambulatory Visit: Admitting: Family Medicine

## 2024-03-13 VITALS — BP 125/77 | HR 74 | Temp 97.4°F | Resp 18 | Ht 61.0 in | Wt 131.4 lb

## 2024-03-13 DIAGNOSIS — T07XXXA Unspecified multiple injuries, initial encounter: Secondary | ICD-10-CM

## 2024-03-13 DIAGNOSIS — E538 Deficiency of other specified B group vitamins: Secondary | ICD-10-CM | POA: Diagnosis not present

## 2024-03-13 DIAGNOSIS — I10 Essential (primary) hypertension: Secondary | ICD-10-CM | POA: Diagnosis not present

## 2024-03-13 DIAGNOSIS — F411 Generalized anxiety disorder: Secondary | ICD-10-CM

## 2024-03-13 DIAGNOSIS — E782 Mixed hyperlipidemia: Secondary | ICD-10-CM | POA: Diagnosis not present

## 2024-03-13 LAB — LIPID PANEL
Cholesterol: 169 mg/dL (ref 0–200)
HDL: 70.6 mg/dL (ref >39.00)
LDL Cholesterol: 79 mg/dL (ref 0–99)
NonHDL: 98.89
Total CHOL/HDL Ratio: 2
Triglycerides: 98 mg/dL (ref 0.0–149.0)
VLDL: 19.6 mg/dL (ref 0.0–40.0)

## 2024-03-13 LAB — COMPREHENSIVE METABOLIC PANEL WITH GFR
ALT: 12 U/L (ref 0–35)
AST: 14 U/L (ref 0–37)
Albumin: 4.3 g/dL (ref 3.5–5.2)
Alkaline Phosphatase: 63 U/L (ref 39–117)
BUN: 17 mg/dL (ref 6–23)
CO2: 28 meq/L (ref 19–32)
Calcium: 8.6 mg/dL (ref 8.4–10.5)
Chloride: 93 meq/L — ABNORMAL LOW (ref 96–112)
Creatinine, Ser: 0.91 mg/dL (ref 0.40–1.20)
GFR: 64.24 mL/min (ref >60.00)
Glucose, Bld: 76 mg/dL (ref 70–99)
Potassium: 3.4 meq/L — ABNORMAL LOW (ref 3.5–5.1)
Sodium: 130 meq/L — ABNORMAL LOW (ref 135–145)
Total Bilirubin: 0.3 mg/dL (ref 0.2–1.2)
Total Protein: 6.8 g/dL (ref 6.0–8.3)

## 2024-03-13 LAB — CBC WITH DIFFERENTIAL/PLATELET
Basophils Absolute: 0.1 K/uL (ref 0.0–0.1)
Basophils Relative: 0.7 % (ref 0.0–3.0)
Eosinophils Absolute: 0.1 K/uL (ref 0.0–0.7)
Eosinophils Relative: 1.2 % (ref 0.0–5.0)
HCT: 35.7 % — ABNORMAL LOW (ref 36.0–46.0)
Hemoglobin: 11.9 g/dL — ABNORMAL LOW (ref 12.0–15.0)
Lymphocytes Relative: 30.6 % (ref 12.0–46.0)
Lymphs Abs: 3.5 K/uL (ref 0.7–4.0)
MCHC: 33.2 g/dL (ref 30.0–36.0)
MCV: 94.3 fl (ref 78.0–100.0)
Monocytes Absolute: 1 K/uL (ref 0.1–1.0)
Monocytes Relative: 9.2 % (ref 3.0–12.0)
Neutro Abs: 6.7 K/uL (ref 1.4–7.7)
Neutrophils Relative %: 58.3 % (ref 43.0–77.0)
Platelets: 312 K/uL (ref 150.0–400.0)
RBC: 3.79 Mil/uL — ABNORMAL LOW (ref 3.87–5.11)
RDW: 13.3 % (ref 11.5–15.5)
WBC: 11.4 K/uL — ABNORMAL HIGH (ref 4.0–10.5)

## 2024-03-13 LAB — MAGNESIUM: Magnesium: 1.5 mg/dL (ref 1.5–2.5)

## 2024-03-13 LAB — PROTIME-INR
INR: 1 ratio (ref 0.8–1.0)
Prothrombin Time: 10.5 s (ref 9.6–13.1)

## 2024-03-13 LAB — APTT: aPTT: 26.5 s (ref 25.4–36.8)

## 2024-03-13 MED ORDER — ATORVASTATIN CALCIUM 40 MG PO TABS: 40.0000 mg | ORAL_TABLET | Freq: Every day | ORAL | 3 refills | Status: AC

## 2024-03-13 MED ORDER — CYANOCOBALAMIN 1000 MCG/ML IJ SOLN
1000.0000 ug | Freq: Once | INTRAMUSCULAR | Status: AC
Start: 2024-03-13 — End: 2024-03-13
  Administered 2024-03-13: 1000 ug via INTRAMUSCULAR

## 2024-03-13 NOTE — Progress Notes (Signed)
 Subjective:     Patient ID: Tracey Saunders, female    DOB: September 25, 1955, 68 y.o.   MRN: [id number]  Chief Complaint  Patient presents with   Medical Management of Chronic Issues    3 month follow-up Discuss skin issues Dry mouth/burning sensation Fasting      HPI Discussed the use of AI scribe software for clinical note transcription with the patient, who gave verbal consent to proceed.  History of Present Illness Tracey Saunders is a 68 year old female who presents with easy bruising and sleep disturbances.  She experiences easy bruising on her arms, with even minor contact resulting in significant bruising. The bruising primarily affects her arms, but she also reports skin breakdown on her shins. No scratching is noted, and she attributes the bruising to minor bumps. Her skin also breaks easily upon minor contact. There is a family history of similar symptoms, as her mother experienced bleeding through the skin on her legs with minor contact.  She visited the emergency room on August 27th due to severe back pain and is scheduled to see a back specialist this month. She reports that when the blood pressure cuff was applied during her ER visit, it seemed to make the bruising worse and caused increased discomfort and inflammation in her arm. No blood work was performed at the ER.  She has ongoing sleep disturbances, specifically difficulty falling asleep despite taking trazodone  at night. She takes trazodone , Zoloft , and acid reflux medication at bedtime but still experiences tossing and turning. Approximately five times a week, the medication does not help her fall asleep. She has a history of lifelong sleep difficulties.  She denies alcohol consumption, stating she quit drinking beer four months ago. No feelings of depression or suicidal ideation, but she expresses frustration with constant pain and fatigue. She no longer takes gabapentin , which she stopped two to three months ago  due to mouth irritation.  She reports throat irritation, describing it as feeling like a throat infection, but no coughing, shortness of breath, or fever.  HLD-taking atorvastatin .  Here for labs  HTN-controlled on amlodipine  5mg  daily.  No cp/sob/edema.  On lasix  bid for chronic edema    Health Maintenance Due  Topic Date Due   Hepatitis C Screening  Never done   Lung Cancer Screening  Never done   MAMMOGRAM  Never done   DEXA SCAN  Never done   Medicare Annual Wellness (AWV)  01/24/2024   INFLUENZA VACCINE  Never done    Past Medical History:  Diagnosis Date   Anxiety disorder 09/09/2019   B12 deficiency    Cancer (HCC)    cervical cancer   Depression    Diverticulitis    Fatty liver    GERD (gastroesophageal reflux disease)    Hyperlipidemia    Hypertension    Insomnia 09/09/2019   Liver cyst    Vitamin D  deficiency     Past Surgical History:  Procedure Laterality Date   ABDOMINAL HYSTERECTOMY     COLON RESECTION     due to diverticulitis   SHOULDER ARTHROSCOPY WITH ROTATOR CUFF REPAIR Right 11/15/2022   Procedure: RIGHT SHOULDER ARTHROSCOPY WITH BICEPS TENODESIS;  Surgeon: Genelle Standing, MD;  Location: [facility];  Service: Orthopedics;  Laterality: Right;     Current Outpatient Medications:    amLODipine  (NORVASC ) 5 MG tablet, Take 1 tablet by mouth once daily, Disp: 90 tablet, Rfl: 1   azelastine  (ASTELIN ) 0.1 % nasal spray, Place  2 sprays into both nostrils 2 (two) times daily. Use in each nostril as directed, Disp: 30 mL, Rfl: 5   Blood Pressure KIT, 1 each by Does not apply route once a week., Disp: 1 kit, Rfl: 0   buPROPion  (WELLBUTRIN  XL) 150 MG 24 hr tablet, TAKE 1 TABLET DAILY, Disp: 90 tablet, Rfl: 3   cyclobenzaprine  (FLEXERIL ) 10 MG tablet, Take 1 tablet (10 mg total) by mouth 2 (two) times daily as needed for muscle spasms., Disp: 20 tablet, Rfl: 0   diazepam  (VALIUM ) 5 MG tablet, Take one tablet by mouth with light food one hour  prior to procedure., Disp: 1 tablet, Rfl: 0   Doxepin  HCl 3 MG TABS, Take 1 tablet (3 mg total) by mouth at bedtime as needed., Disp: 30 tablet, Rfl: 1   esomeprazole  (NEXIUM ) 40 MG capsule, Take 1 capsule (40 mg total) by mouth daily., Disp: 90 capsule, Rfl: 1   fluticasone  (FLONASE ) 50 MCG/ACT nasal spray, Place 2 sprays into both nostrils daily., Disp: 16 g, Rfl: 6   furosemide  (LASIX ) 20 MG tablet, TAKE 1 TABLET TWICE A DAY, Disp: 180 tablet, Rfl: 3   Humidifiers MISC, Use as needed to prevent dry mouth, Disp: 1 each, Rfl: 0   lidocaine  (LIDODERM ) 5 %, Place 1 patch onto the skin daily. Remove & Discard patch within 12 hours or as directed by MD, Disp: 30 patch, Rfl: 0   methylPREDNISolone  (MEDROL  DOSEPAK) 4 MG TBPK tablet, Take as directed on the box, Disp: 1 each, Rfl: 0   oxyCODONE -acetaminophen  (PERCOCET/ROXICET) 5-325 MG tablet, Take 1 tablet by mouth every 6 (six) hours as needed for severe pain (pain score 7-10)., Disp: 15 tablet, Rfl: 0   potassium chloride  SA (KLOR-CON  M20) 20 MEQ tablet, Take 1 tablet (20 mEq total) by mouth 2 (two) times daily., Disp: 180 tablet, Rfl: 1   sertraline  (ZOLOFT ) 50 MG tablet, TAKE 1 AND 1/2 TABLETS AT  BEDTIME, Disp: 135 tablet, Rfl: 3   traZODone  (DESYREL ) 100 MG tablet, Take 1 tablet (100 mg total) by mouth at bedtime as needed for sleep. Please note, did larger dose so only 1 tab, Disp: 90 tablet, Rfl: 3   atorvastatin  (LIPITOR) 40 MG tablet, Take 1 tablet (40 mg total) by mouth daily., Disp: 90 tablet, Rfl: 3  Current Facility-Administered Medications:    diclofenac  Sodium (VOLTAREN ) 1 % topical gel 4 g, 4 g, Topical, QID, Corey, Evan S, MD  Allergies  Allergen Reactions   Gabapentin  Other (See Comments)    Messed w/my head   Lisinopril  Swelling   Losartan  Swelling   Pseudoephedrine-Naproxen Na Er     Other Reaction(s): Other (See Comments). Swelling reported in multiple joints   Lyrica [Pregabalin] Other (See Comments)    Dry mouth   ROS  neg/noncontributory except as noted HPI/below      Objective:     BP 125/77   Pulse 74   Temp (!) 97.4 F (36.3 C) (Temporal)   Resp 18   Ht 5' 1 (1.549 m)   Wt 131 lb 6 oz (59.6 kg)   SpO2 97%   BMI 24.82 kg/m  Wt Readings from Last 3 Encounters:  03/13/24 131 lb 6 oz (59.6 kg)  03/06/24 135 lb (61.2 kg)  01/03/24 135 lb (61.2 kg)    Physical Exam   Gen: WDWN NAD HEENT: NCAT, conjunctiva not injected, sclera nonicteric OP normal.  dry NECK:  supple, no thyromegaly, no nodes, no carotid bruits CARDIAC: RRR, S1S2+, no  murmur.  LUNGS: CTAB. No wheezes ABDOMEN:  BS+, soft, NTND, No HSM, no masses EXT:  no edema MSK: no gross abnormalities.  NEURO: A&O x3.  CN II-XII intact.  PSYCH: normal mood. Good eye contact A lot of bruising on B arms and skin tear R elbow.       Assessment & Plan:  Multiple bruises -     CBC with Differential/Platelet -     Protime-INR -     APTT  Generalized anxiety disorder  Essential hypertension -     Comprehensive metabolic panel with GFR -     Magnesium   Mixed hyperlipidemia -     Comprehensive metabolic panel with GFR -     Atorvastatin  Calcium ; Take 1 tablet (40 mg total) by mouth daily.  Dispense: 90 tablet; Refill: 3 -     Lipid panel  Vitamin B12 deficiency -     Cyanocobalamin   Assessment and Plan Assessment & Plan Easy bruising and skin fragility   She reports easy bruising and skin fragility, mainly on her arms, with minimal trauma. She has not consumed alcohol for the past four months. There is a family history of similar symptoms in her mother. Blood work will be ordered to evaluate underlying causes.  Insomnia   She experiences chronic difficulty falling asleep despite taking trazodone  at bedtime. Her nighttime medications include trazodone , sertraline , and acid reflux medication. There is no report of suicidal ideation or depression. Pain may contribute to her sleep disturbances. It was discussed to adjust  medication timing to improve sleep onset. She is advised to take trazodone  and other nighttime medications at supper rather than bedtime. Her sleep patterns will be monitored, and she should report back on the effectiveness of the medication timing adjustment. Consideration will be given to alternative medications like Seroquel if the current regimen remains ineffective.  Hyperlipidemia   Her hyperlipidemia is currently managed with atorvastatin . The atorvastatin  prescription will be refilled at [facility].  General Health Maintenance   A B12 shot was discussed and will be administered today.  HTN-chronic.  Controlled.  Continue amlodipine  5mg  daily  Follow-Up   Follow-up plans for B12 shots and future appointments were discussed. A follow-up appointment is scheduled for November unless an earlier B12 shot is needed. She will return for blood work after today's visit.    Return for as sch nov 10.  Jenkins CHRISTELLA Carrel, MD

## 2024-03-13 NOTE — Patient Instructions (Signed)
 Take the trazodone  and the others at supper, rather then bedtime.   And see if sleep better.

## 2024-03-14 ENCOUNTER — Ambulatory Visit: Payer: Self-pay | Admitting: Family Medicine

## 2024-03-14 NOTE — Progress Notes (Signed)
 No bad reason for bruising Wbc elevated from being on steroids Sodium and potassium a little low-not sure if from meds, verify not vomiting nor diarrhea.  Repeat bmp 1-2 wks

## 2024-03-15 ENCOUNTER — Telehealth: Payer: Self-pay | Admitting: *Deleted

## 2024-03-15 ENCOUNTER — Other Ambulatory Visit: Payer: Self-pay | Admitting: *Deleted

## 2024-03-15 DIAGNOSIS — R7989 Other specified abnormal findings of blood chemistry: Secondary | ICD-10-CM

## 2024-03-15 DIAGNOSIS — E876 Hypokalemia: Secondary | ICD-10-CM

## 2024-03-15 NOTE — Telephone Encounter (Signed)
 Copied from CRM #[id number]. Topic: Clinical - Lab/Test Results >> Mar 15, 2024  1:23 PM Lauren C wrote: Reason for CRM: FYI- results relayed, no further questions. Confirmed no vomiting or diarrhea. >> Mar 15, 2024  1:29 PM Tinnie C wrote: Lab appt schedule for 9/12.  Noted.

## 2024-03-22 ENCOUNTER — Ambulatory Visit: Payer: Self-pay | Admitting: Physician Assistant

## 2024-03-22 ENCOUNTER — Other Ambulatory Visit (INDEPENDENT_AMBULATORY_CARE_PROVIDER_SITE_OTHER)

## 2024-03-22 DIAGNOSIS — R7989 Other specified abnormal findings of blood chemistry: Secondary | ICD-10-CM | POA: Diagnosis not present

## 2024-03-22 DIAGNOSIS — E876 Hypokalemia: Secondary | ICD-10-CM | POA: Diagnosis not present

## 2024-03-22 LAB — BASIC METABOLIC PANEL WITH GFR
BUN: 12 mg/dL (ref 6–23)
CO2: 28 meq/L (ref 19–32)
Calcium: 9.3 mg/dL (ref 8.4–10.5)
Chloride: 102 meq/L (ref 96–112)
Creatinine, Ser: 0.92 mg/dL (ref 0.40–1.20)
GFR: 63.39 mL/min (ref >60.00)
Glucose, Bld: 88 mg/dL (ref 70–99)
Potassium: 3.8 meq/L (ref 3.5–5.1)
Sodium: 139 meq/L (ref 135–145)

## 2024-04-09 ENCOUNTER — Encounter: Payer: Self-pay | Admitting: Physician Assistant

## 2024-04-09 ENCOUNTER — Ambulatory Visit: Admitting: Physician Assistant

## 2024-04-09 VITALS — BP 140/80 | HR 84 | Temp 97.9°F | Ht 61.0 in | Wt 126.0 lb

## 2024-04-09 DIAGNOSIS — R300 Dysuria: Secondary | ICD-10-CM

## 2024-04-09 LAB — POCT URINALYSIS DIPSTICK
Bilirubin, UA: NEGATIVE
Blood, UA: NEGATIVE
Glucose, UA: NEGATIVE
Ketones, UA: NEGATIVE
Leukocytes, UA: NEGATIVE
Nitrite, UA: NEGATIVE
Protein, UA: NEGATIVE
Spec Grav, UA: 1.015 (ref 1.010–1.025)
Urobilinogen, UA: 0.2 U/dL
pH, UA: 7 (ref 5.0–8.0)

## 2024-04-09 MED ORDER — CEPHALEXIN 500 MG PO CAPS: 500.0000 mg | ORAL_CAPSULE | Freq: Two times a day (BID) | ORAL | 0 refills | Status: DC

## 2024-04-09 NOTE — Progress Notes (Signed)
 Tracey Saunders is a 68 y.o. female here for a follow up of a pre-existing problem.  History of Present Illness:   Chief Complaint  Patient presents with   Dysuria    Pt c/o burning with urination and incontinence x 2 weeks. Denies fever or chills. Pt took Azo last 2 days ago.    Discussed the use of AI scribe software for clinical note transcription with the patient, who gave verbal consent to proceed.  History of Present Illness   Tracey Saunders is a 68 year old female who presents with symptoms suggestive of a urinary tract infection.  She experiences urinary urgency and incontinence, unable to reach the toilet in time. She took a full packet of Azo two days ago without relief, although it usually alleviates her symptoms. She has not used antibiotics for a urinary tract infection in over a month. She drinks four bottles of water daily to help flush her system. No nausea, fever, or chills. She denies any history of kidney stones. She reports a longstanding yellowish vaginal discharge that has not changed recently and does not consider it problematic. No vaginal pain. No constipation. She attributes her weight loss to dietary changes, including increased intake of vegetables, protein, and fruit. No abnormal pain in the kidney area or abdominal tenderness, but mentions discomfort in the bladder.       Past Medical History:  Diagnosis Date   Anxiety disorder 09/09/2019   B12 deficiency    Cancer (HCC)    cervical cancer   Depression    Diverticulitis    Fatty liver    GERD (gastroesophageal reflux disease)    Hyperlipidemia    Hypertension    Insomnia 09/09/2019   Liver cyst    Vitamin D  deficiency      Social History   Tobacco Use   Smoking status: Light Smoker    Current packs/day: 0.50    Types: Cigarettes   Smokeless tobacco: Never   Tobacco comments:    Smokes 3 cigarettes a day   Vaping Use   Vaping status: Never Used  Substance Use Topics   Alcohol use: Not  Currently    Comment: occasional beer or bloody mary once a week   Drug use: Never    Past Surgical History:  Procedure Laterality Date   ABDOMINAL HYSTERECTOMY     COLON RESECTION     due to diverticulitis   SHOULDER ARTHROSCOPY WITH ROTATOR CUFF REPAIR Right 11/15/2022   Procedure: RIGHT SHOULDER ARTHROSCOPY WITH BICEPS TENODESIS;  Surgeon: Genelle Standing, MD;  Location: [facility];  Service: Orthopedics;  Laterality: Right;    Family History  Problem Relation Age of Onset   Stroke Mother    Colon cancer Neg Hx    Esophageal cancer Neg Hx    Stomach cancer Neg Hx    Rectal cancer Neg Hx    Sleep apnea Neg Hx     Allergies  Allergen Reactions   Gabapentin  Other (See Comments)    Messed w/my head   Lisinopril  Swelling   Losartan  Swelling   Pseudoephedrine-Naproxen Na Er     Other Reaction(s): Other (See Comments). Swelling reported in multiple joints   Lyrica [Pregabalin] Other (See Comments)    Dry mouth    Current Medications:   Current Outpatient Medications:    amLODipine  (NORVASC ) 5 MG tablet, Take 1 tablet by mouth once daily, Disp: 90 tablet, Rfl: 1   atorvastatin  (LIPITOR) 40 MG tablet, Take 1 tablet (40  mg total) by mouth daily., Disp: 90 tablet, Rfl: 3   azelastine  (ASTELIN ) 0.1 % nasal spray, Place 2 sprays into both nostrils 2 (two) times daily. Use in each nostril as directed, Disp: 30 mL, Rfl: 5   Blood Pressure KIT, 1 each by Does not apply route once a week., Disp: 1 kit, Rfl: 0   buPROPion  (WELLBUTRIN  XL) 150 MG 24 hr tablet, TAKE 1 TABLET DAILY, Disp: 90 tablet, Rfl: 3   cephALEXin  (KEFLEX ) 500 MG capsule, Take 1 capsule (500 mg total) by mouth 2 (two) times daily., Disp: 14 capsule, Rfl: 0   cyclobenzaprine  (FLEXERIL ) 10 MG tablet, Take 1 tablet (10 mg total) by mouth 2 (two) times daily as needed for muscle spasms., Disp: 20 tablet, Rfl: 0   Doxepin  HCl 3 MG TABS, Take 1 tablet (3 mg total) by mouth at bedtime as needed., Disp: 30  tablet, Rfl: 1   esomeprazole  (NEXIUM ) 40 MG capsule, Take 1 capsule (40 mg total) by mouth daily., Disp: 90 capsule, Rfl: 1   fluticasone  (FLONASE ) 50 MCG/ACT nasal spray, Place 2 sprays into both nostrils daily., Disp: 16 g, Rfl: 6   furosemide  (LASIX ) 20 MG tablet, TAKE 1 TABLET TWICE A DAY, Disp: 180 tablet, Rfl: 3   Humidifiers MISC, Use as needed to prevent dry mouth, Disp: 1 each, Rfl: 0   lidocaine  (LIDODERM ) 5 %, Place 1 patch onto the skin daily. Remove & Discard patch within 12 hours or as directed by MD, Disp: 30 patch, Rfl: 0   potassium chloride  SA (KLOR-CON  M20) 20 MEQ tablet, Take 1 tablet (20 mEq total) by mouth 2 (two) times daily., Disp: 180 tablet, Rfl: 1   sertraline  (ZOLOFT ) 50 MG tablet, TAKE 1 AND 1/2 TABLETS AT  BEDTIME, Disp: 135 tablet, Rfl: 3   traZODone  (DESYREL ) 100 MG tablet, Take 1 tablet (100 mg total) by mouth at bedtime as needed for sleep. Please note, did larger dose so only 1 tab, Disp: 90 tablet, Rfl: 3  Current Facility-Administered Medications:    diclofenac  Sodium (VOLTAREN ) 1 % topical gel 4 g, 4 g, Topical, QID, Corey, Evan S, MD   Review of Systems:   Negative unless otherwise specified per HPI.  Vitals:   Vitals:   04/09/24 1117  BP: (!) 140/80  Pulse: 84  Temp: 97.9 F (36.6 C)  TempSrc: Temporal  SpO2: 96%  Weight: 126 lb (57.2 kg)  Height: 5' 1 (1.549 m)     Body mass index is 23.81 kg/m.  Physical Exam:   Physical Exam Vitals and nursing note reviewed.  Constitutional:      General: She is not in acute distress.    Appearance: Normal appearance. She is well-developed. She is not ill-appearing or toxic-appearing.  HENT:     Head: Normocephalic and atraumatic.  Eyes:     General: Lids are normal.     Extraocular Movements: Extraocular movements intact.     Conjunctiva/sclera: Conjunctivae normal.  Cardiovascular:     Rate and Rhythm: Normal rate and regular rhythm.     Pulses: Normal pulses.     Heart sounds: Normal  heart sounds, S1 normal and S2 normal.  Pulmonary:     Effort: Pulmonary effort is normal.     Breath sounds: Normal breath sounds.  Abdominal:     Tenderness: There is no right CVA tenderness or left CVA tenderness.  Musculoskeletal:        General: Normal range of motion.     Cervical back:  Normal range of motion and neck supple.  Skin:    General: Skin is warm and dry.  Neurological:     Mental Status: She is alert and oriented to person, place, and time.     GCS: GCS eye subscore is 4. GCS verbal subscore is 5. GCS motor subscore is 6.  Psychiatric:        Attention and Perception: Attention and perception normal.        Mood and Affect: Mood normal.        Speech: Speech normal.        Behavior: Behavior normal. Behavior is cooperative.        Thought Content: Thought content normal.        Judgment: Judgment normal.     Assessment and Plan:   Assessment and Plan    Dysuria Symptoms of urgency and dysuria suggest UTI. History of UTIs and ineffective OTC treatment. Empirical antibiotic treatment warranted despite normal initial urine test. Urine culture ordered. - Prescribed Cefalexin, sent prescription to [facility]. - Explained improvement expected in one to two days if effective. - Instructed to report worsening symptoms. - Plan follow-up after urine culture results.        Lucie Buttner, PA-C

## 2024-04-09 NOTE — Patient Instructions (Signed)
 Wt Readings from Last 3 Encounters:  04/09/24 126 lb (57.2 kg)  03/13/24 131 lb 6 oz (59.6 kg)  03/06/24 135 lb (61.2 kg)    Wt Readings from Last 10 Encounters:  04/09/24 126 lb (57.2 kg)  03/13/24 131 lb 6 oz (59.6 kg)  03/06/24 135 lb (61.2 kg)  01/03/24 135 lb (61.2 kg)  12/11/23 134 lb 6 oz (61 kg)  09/25/23 133 lb (60.3 kg)  09/15/23 137 lb (62.1 kg)  07/17/23 138 lb (62.6 kg)  06/20/23 133 lb 8 oz (60.6 kg)  05/30/23 135 lb 3.2 oz (61.3 kg)

## 2024-04-10 ENCOUNTER — Ambulatory Visit (HOSPITAL_BASED_OUTPATIENT_CLINIC_OR_DEPARTMENT_OTHER): Admitting: Orthopaedic Surgery

## 2024-04-10 ENCOUNTER — Telehealth (HOSPITAL_BASED_OUTPATIENT_CLINIC_OR_DEPARTMENT_OTHER): Payer: Self-pay | Admitting: Orthopaedic Surgery

## 2024-04-10 NOTE — Telephone Encounter (Signed)
 Patient would like pain meds sent [facility] at [place]

## 2024-04-12 ENCOUNTER — Ambulatory Visit: Payer: Self-pay | Admitting: Physician Assistant

## 2024-04-12 LAB — URINE CULTURE
MICRO NUMBER:: 17036078
SPECIMEN QUALITY:: ADEQUATE

## 2024-04-15 ENCOUNTER — Ambulatory Visit: Payer: Medicare HMO | Admitting: Speech Pathology

## 2024-04-17 ENCOUNTER — Other Ambulatory Visit: Payer: Self-pay | Admitting: Family Medicine

## 2024-04-17 DIAGNOSIS — I10 Essential (primary) hypertension: Secondary | ICD-10-CM

## 2024-04-18 NOTE — Progress Notes (Signed)
 Tracey Saunders                                          MRN: [id number]   04/18/2024   The [facility] Quality Team Specialist reviewed this patient medical record for the purposes of chart review for care gap closure. The following were reviewed: chart review for care gap closure-controlling blood pressure.    [facility] Quality Team

## 2024-04-19 ENCOUNTER — Telehealth: Payer: Self-pay

## 2024-04-19 NOTE — Telephone Encounter (Signed)
 Copied from CRM [id number]. Topic: General - Other >> Apr 19, 2024  2:23 PM Thersia C wrote: Reason for CRM: Patient called in regarding her medication , feels like she may need another round of  cephALEXin  (KEFLEX ) 500 MG capsule .SABRA Would like it sent to her pharmacy   Please see pt call msg regarding another round of medication. Per protocol pt will need OV or approval from provider

## 2024-04-20 ENCOUNTER — Other Ambulatory Visit: Payer: Self-pay | Admitting: Family

## 2024-04-20 MED ORDER — CEPHALEXIN 500 MG PO CAPS: 500.0000 mg | ORAL_CAPSULE | Freq: Two times a day (BID) | ORAL | 0 refills | Status: DC

## 2024-04-25 ENCOUNTER — Ambulatory Visit (HOSPITAL_BASED_OUTPATIENT_CLINIC_OR_DEPARTMENT_OTHER): Admitting: Orthopaedic Surgery

## 2024-04-25 ENCOUNTER — Ambulatory Visit (HOSPITAL_BASED_OUTPATIENT_CLINIC_OR_DEPARTMENT_OTHER): Admitting: Student

## 2024-04-25 DIAGNOSIS — M5416 Radiculopathy, lumbar region: Secondary | ICD-10-CM

## 2024-04-25 NOTE — Progress Notes (Signed)
 Chief Complaint: Low back and right leg pain     History of Present Illness:    Tracey Saunders is a 68 y.o. female who presents today for further evaluation of right-sided low back and right leg pain.  She was last seen in our clinic on 7/28 by Dr. Genelle and received a right shoulder subacromial injection.  Today she reports that her shoulder has not been bothering her.  She was seen in the emergency department on 8/27 for her similar complaints as today.  She had a CT of the lumbar spine performed at that time and last  lumbar spine MRI was in June 2023.  Today she reports severe right-sided low back pain with pain, numbness, and tingling extending down the entirety of the right leg.  This is particularly worsened when moving from a seated to a standing position.  No bowel/bladder dysfunction or saddle anesthesia.  She is not taking any pain medications.   Surgical History:   None of lumbar spine  PMH/PSH/Family History/Social History/Meds/Allergies:    Past Medical History:  Diagnosis Date   Anxiety disorder 09/09/2019   B12 deficiency    Cancer (HCC)    cervical cancer   Depression    Diverticulitis    Fatty liver    GERD (gastroesophageal reflux disease)    Hyperlipidemia    Hypertension    Insomnia 09/09/2019   Liver cyst    Vitamin D  deficiency    Past Surgical History:  Procedure Laterality Date   ABDOMINAL HYSTERECTOMY     COLON RESECTION     due to diverticulitis   SHOULDER ARTHROSCOPY WITH ROTATOR CUFF REPAIR Right 11/15/2022   Procedure: RIGHT SHOULDER ARTHROSCOPY WITH BICEPS TENODESIS;  Surgeon: Genelle Standing, MD;  Location: [facility];  Service: Orthopedics;  Laterality: Right;   Social History   Socioeconomic History   Marital status: Single    Spouse name: Not on file   Number of children: 1   Years of education: Not on file   Highest education level: Not on file  Occupational History   Occupation:  retired   Occupation: retired  Tobacco Use   Smoking status: Light Smoker    Current packs/day: 0.50    Types: Cigarettes   Smokeless tobacco: Never   Tobacco comments:    Smokes 3 cigarettes a day   Vaping Use   Vaping status: Never Used  Substance and Sexual Activity   Alcohol use: Not Currently    Comment: occasional beer or bloody mary once a week   Drug use: Never   Sexual activity: Not on file  Other Topics Concern   Not on file  Social History Narrative   Are you right handed or left handed? Right    Are you currently employed ? Retired   What is your current occupation?   Do you live at home alone? Y   Who lives with you?    What type of home do you live in: 1 story or 2 story?        Social Drivers of Corporate investment banker Strain: Low Risk  (01/24/2023)   Overall Financial Resource Strain (CARDIA)    Difficulty of Paying Living Expenses: Not hard at all  Food Insecurity: No Food Insecurity (01/24/2023)   Hunger Vital Sign  Worried About Programme researcher, broadcasting/film/video in the Last Year: Never true    Ran Out of Food in the Last Year: Never true  Transportation Needs: No Transportation Needs (01/24/2023)   PRAPARE - Administrator, Civil Service (Medical): No    Lack of Transportation (Non-Medical): No  Physical Activity: Sufficiently Active (01/24/2023)   Exercise Vital Sign    Days of Exercise per Week: 5 days    Minutes of Exercise per Session: 30 min  Stress: No Stress Concern Present (01/24/2023)   [employer] of Occupational Health - Occupational Stress Questionnaire    Feeling of Stress : Not at all  Social Connections: Socially Isolated (01/24/2023)   Social Connection and Isolation Panel    Frequency of Communication with Friends and Family: More than three times a week    Frequency of Social Gatherings with Friends and Family: Once a week    Attends Religious Services: Never    Database administrator or Organizations: No    Attends Museum/gallery exhibitions officer: Never    Marital Status: Never married   Family History  Problem Relation Age of Onset   Stroke Mother    Colon cancer Neg Hx    Esophageal cancer Neg Hx    Stomach cancer Neg Hx    Rectal cancer Neg Hx    Sleep apnea Neg Hx    Allergies  Allergen Reactions   Gabapentin  Other (See Comments)    Messed w/my head   Lisinopril  Swelling   Losartan  Swelling   Pseudoephedrine-Naproxen Na Er     Other Reaction(s): Other (See Comments). Swelling reported in multiple joints   Lyrica [Pregabalin] Other (See Comments)    Dry mouth   Current Outpatient Medications  Medication Sig Dispense Refill   amLODipine  (NORVASC ) 5 MG tablet TAKE 1 TABLET DAILY 90 tablet 3   atorvastatin  (LIPITOR) 40 MG tablet Take 1 tablet (40 mg total) by mouth daily. 90 tablet 3   azelastine  (ASTELIN ) 0.1 % nasal spray Place 2 sprays into both nostrils 2 (two) times daily. Use in each nostril as directed 30 mL 5   Blood Pressure KIT 1 each by Does not apply route once a week. 1 kit 0   buPROPion  (WELLBUTRIN  XL) 150 MG 24 hr tablet TAKE 1 TABLET DAILY 90 tablet 3   cephALEXin  (KEFLEX ) 500 MG capsule Take 1 capsule (500 mg total) by mouth 2 (two) times daily. 14 capsule 0   cyclobenzaprine  (FLEXERIL ) 10 MG tablet Take 1 tablet (10 mg total) by mouth 2 (two) times daily as needed for muscle spasms. 20 tablet 0   Doxepin  HCl 3 MG TABS Take 1 tablet (3 mg total) by mouth at bedtime as needed. 30 tablet 1   esomeprazole  (NEXIUM ) 40 MG capsule Take 1 capsule (40 mg total) by mouth daily. 90 capsule 1   fluticasone  (FLONASE ) 50 MCG/ACT nasal spray Place 2 sprays into both nostrils daily. 16 g 6   furosemide  (LASIX ) 20 MG tablet TAKE 1 TABLET TWICE A DAY 180 tablet 3   Humidifiers MISC Use as needed to prevent dry mouth 1 each 0   lidocaine  (LIDODERM ) 5 % Place 1 patch onto the skin daily. Remove & Discard patch within 12 hours or as directed by MD 30 patch 0   potassium chloride  SA (KLOR-CON  M20) 20  MEQ tablet Take 1 tablet (20 mEq total) by mouth 2 (two) times daily. 180 tablet 1   sertraline  (ZOLOFT ) 50 MG tablet  TAKE 1 AND 1/2 TABLETS AT  BEDTIME 135 tablet 3   traZODone  (DESYREL ) 100 MG tablet Take 1 tablet (100 mg total) by mouth at bedtime as needed for sleep. Please note, did larger dose so only 1 tab 90 tablet 3   Current Facility-Administered Medications  Medication Dose Route Frequency Provider Last Rate Last Admin   diclofenac  Sodium (VOLTAREN ) 1 % topical gel 4 g  4 g Topical QID Corey, Evan S, MD       No results found.  Review of Systems:   A ROS was performed including pertinent positives and negatives as documented in the HPI.  Physical Exam :   Constitutional: NAD and appears stated age Neurological: Alert and oriented Psych: Appropriate affect and cooperative There were no vitals taken for this visit.   Comprehensive Musculoskeletal Exam:    Tenderness over the lower midline of the lumbar spine and right lumbar musculature.  Full fluid passive right hip range of motion to 120 degrees flexion, 40 degrees ER, and 30 degrees IR.  Positive right straight leg raise.  Bilateral knee flexion/extension ankle dorsiflexion/plantarflexion strength is 5/5.  No point tenderness over the greater trochanter.  Imaging:   CT lumbar spine results from 03/06/2024: FINDINGS: Segmentation: 5 lumbar type vertebrae.   Alignment: Minimal levoscoliosis.   Vertebrae: No acute fracture or focal pathologic process.   Paraspinal and other soft tissues: No acute finding. Aortic atherosclerosis   Disc levels:   At L1-L2, patent disc space.  No canal stenosis.  Patent foramen.   At L2-L3, disc space narrowing. Diffuse disc bulge. Mild ligamentum flavum thickening. Mild bilateral facet degenerative changes. No bony foraminal narrowing. Possible mild canal stenosis.   At L3-L4, disc space narrowing. Mild diffuse and left subarticular/foraminal   At L4-L5, patent disc space. Mild  diffuse disc bulge. No canal stenosis. Moderate facet degenerative change in mild ligamentum flavum thickening. Grossly patent foramen.   At L5-S1, disc space narrowing. Moderate bilateral facet degenerative changes. Patent foramen. No significant canal stenosis. Disc bulge. Mild ligamentum flavum thickening and facet degenerative change. No high-grade canal stenosis. Patent appearing foramen.  Assessment:   69 y.o. 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  Episodes have been happening more frequently since ED visit in August and symptoms are now fairly persistent.  She does have known history of small disc bulges and spinal stenosis seen on previous MRI and CT scan.  No red flag symptoms are present today.  Discussed treatment options including anti-inflammatory medication, physical therapy, or potential MRI.  Her last MRI was performed just over 2 years ago but may need repeated scan if she is to be considered for ESI.  After consideration, patient only wishes to proceed with medication at this time, although she states it cannot be sent until early November due to financial concerns.  Discussed that I would likely send in either an NSAID or oral steroid and cannot provide strong pain medication.  Patient will follow-up if symptoms worsen  Plan :    - Consider referral to physical therapy and will let us  know if she would like to proceed with NSAID prescription - Return precautions given and follow-up as needed     I personally saw and evaluated the patient, and participated in the management and treatment plan.  Leonce Reveal, PA-C Orthopedics

## 2024-05-07 ENCOUNTER — Other Ambulatory Visit: Payer: Self-pay

## 2024-05-07 ENCOUNTER — Emergency Department (HOSPITAL_COMMUNITY)

## 2024-05-07 ENCOUNTER — Encounter (HOSPITAL_COMMUNITY): Payer: Self-pay

## 2024-05-07 ENCOUNTER — Emergency Department (HOSPITAL_COMMUNITY)
Admission: EM | Admit: 2024-05-07 | Discharge: 2024-05-08 | Disposition: A | Discharge status: Home / Self Care | Attending: Emergency Medicine | Admitting: Emergency Medicine

## 2024-05-07 DIAGNOSIS — F1110 Opioid abuse, uncomplicated: Secondary | ICD-10-CM | POA: Insufficient documentation

## 2024-05-07 DIAGNOSIS — Z789 Other specified health status: Secondary | ICD-10-CM

## 2024-05-07 DIAGNOSIS — I6782 Cerebral ischemia: Secondary | ICD-10-CM | POA: Diagnosis not present

## 2024-05-07 DIAGNOSIS — S199XXA Unspecified injury of neck, initial encounter: Secondary | ICD-10-CM | POA: Diagnosis not present

## 2024-05-07 DIAGNOSIS — S0990XA Unspecified injury of head, initial encounter: Secondary | ICD-10-CM | POA: Diagnosis not present

## 2024-05-07 DIAGNOSIS — Z743 Need for continuous supervision: Secondary | ICD-10-CM | POA: Diagnosis not present

## 2024-05-07 DIAGNOSIS — S0003XA Contusion of scalp, initial encounter: Secondary | ICD-10-CM | POA: Diagnosis not present

## 2024-05-07 DIAGNOSIS — Z79899 Other long term (current) drug therapy: Secondary | ICD-10-CM | POA: Diagnosis not present

## 2024-05-07 DIAGNOSIS — G319 Degenerative disease of nervous system, unspecified: Secondary | ICD-10-CM | POA: Diagnosis not present

## 2024-05-07 DIAGNOSIS — Y906 Blood alcohol level of 120-199 mg/100 ml: Secondary | ICD-10-CM | POA: Diagnosis not present

## 2024-05-07 DIAGNOSIS — S0101XA Laceration without foreign body of scalp, initial encounter: Secondary | ICD-10-CM | POA: Insufficient documentation

## 2024-05-07 DIAGNOSIS — F1010 Alcohol abuse, uncomplicated: Secondary | ICD-10-CM | POA: Diagnosis not present

## 2024-05-07 DIAGNOSIS — J439 Emphysema, unspecified: Secondary | ICD-10-CM | POA: Diagnosis not present

## 2024-05-07 DIAGNOSIS — I10 Essential (primary) hypertension: Secondary | ICD-10-CM | POA: Diagnosis not present

## 2024-05-07 DIAGNOSIS — W19XXXA Unspecified fall, initial encounter: Secondary | ICD-10-CM | POA: Insufficient documentation

## 2024-05-07 DIAGNOSIS — F10929 Alcohol use, unspecified with intoxication, unspecified: Secondary | ICD-10-CM | POA: Diagnosis not present

## 2024-05-07 LAB — COMPREHENSIVE METABOLIC PANEL WITH GFR
ALT: 26 U/L (ref 0–44)
AST: 25 U/L (ref 15–41)
Albumin: 3.7 g/dL (ref 3.5–5.0)
Alkaline Phosphatase: 55 U/L (ref 38–126)
Anion gap: 12 (ref 5–15)
BUN: 13 mg/dL (ref 8–23)
CO2: 22 mmol/L (ref 22–32)
Calcium: 8.3 mg/dL — ABNORMAL LOW (ref 8.9–10.3)
Chloride: 104 mmol/L (ref 98–111)
Creatinine, Ser: 0.98 mg/dL (ref 0.44–1.00)
GFR, Estimated: >60 mL/min (ref >60)
Glucose, Bld: 84 mg/dL (ref 70–99)
Potassium: 3.2 mmol/L — ABNORMAL LOW (ref 3.5–5.1)
Sodium: 138 mmol/L (ref 135–145)
Total Bilirubin: 0.4 mg/dL (ref 0.0–1.2)
Total Protein: 6.3 g/dL — ABNORMAL LOW (ref 6.5–8.1)

## 2024-05-07 LAB — CBC WITH DIFFERENTIAL/PLATELET
Abs Immature Granulocytes: 0.05 K/uL (ref 0.00–0.07)
Basophils Absolute: 0.1 K/uL (ref 0.0–0.1)
Basophils Relative: 1 %
Eosinophils Absolute: 0.1 K/uL (ref 0.0–0.5)
Eosinophils Relative: 1 %
HCT: 35.6 % — ABNORMAL LOW (ref 36.0–46.0)
Hemoglobin: 11.7 g/dL — ABNORMAL LOW (ref 12.0–15.0)
Immature Granulocytes: 0 %
Lymphocytes Relative: 16 %
Lymphs Abs: 2.1 K/uL (ref 0.7–4.0)
MCH: 31.5 pg (ref 26.0–34.0)
MCHC: 32.9 g/dL (ref 30.0–36.0)
MCV: 96 fL (ref 80.0–100.0)
Monocytes Absolute: 0.7 K/uL (ref 0.1–1.0)
Monocytes Relative: 6 %
Neutro Abs: 10 K/uL — ABNORMAL HIGH (ref 1.7–7.7)
Neutrophils Relative %: 76 %
Platelets: 275 K/uL (ref 150–400)
RBC: 3.71 MIL/uL — ABNORMAL LOW (ref 3.87–5.11)
RDW: 12.8 % (ref 11.5–15.5)
WBC: 13 K/uL — ABNORMAL HIGH (ref 4.0–10.5)
nRBC: 0 % (ref 0.0–0.2)

## 2024-05-07 LAB — ETHANOL: Alcohol, Ethyl (B): 140 mg/dL — ABNORMAL HIGH (ref <15)

## 2024-05-07 LAB — TROPONIN I (HIGH SENSITIVITY): Troponin I (High Sensitivity): 3 ng/L (ref <18)

## 2024-05-07 LAB — CBG MONITORING, ED: Glucose-Capillary: 71 mg/dL (ref 70–99)

## 2024-05-07 MED ORDER — MORPHINE SULFATE (PF) 4 MG/ML IV SOLN
4.0000 mg | Freq: Once | INTRAVENOUS | Status: AC
  Administered 2024-05-07: 4 mg via INTRAVENOUS
  Filled 2024-05-07: qty 1

## 2024-05-07 MED ORDER — TETANUS-DIPHTH-ACELL PERTUSSIS 5-2-15.5 LF-MCG/0.5 IM SUSP
0.5000 mL | Freq: Once | INTRAMUSCULAR | Status: DC
  Filled 2024-05-07: qty 0.5

## 2024-05-07 MED ORDER — POTASSIUM CHLORIDE CRYS ER 20 MEQ PO TBCR
40.0000 meq | EXTENDED_RELEASE_TABLET | Freq: Once | ORAL | Status: AC
  Administered 2024-05-07: 40 meq via ORAL
  Filled 2024-05-07: qty 2

## 2024-05-07 MED ORDER — LIDOCAINE-EPINEPHRINE (PF) 2 %-1:200000 IJ SOLN
10.0000 mL | Freq: Once | INTRAMUSCULAR | Status: AC
  Administered 2024-05-07: 10 mL
  Filled 2024-05-07: qty 20

## 2024-05-07 NOTE — ED Provider Notes (Signed)
 [place] EMERGENCY DEPARTMENT AT [facility] Provider Note   CSN: [id number] Arrival date & time: 05/07/24  2114     Patient presents with: [name]   Tracey Saunders is a 68 y.o. female.   Patient with history of GERD, hypertension, hyperlipidemia presents today with complaints of fall. Reports that this evening she went on a date and had 2 bloody mary's and then her date brought her home and left. Patient states that she vaguely remembers changing clothes and then next thing she knew she was in the ambulance coming here. She reports that she lives alone, was told that her neighbor found her and called 911. She denies history of similar symptoms previously. She has no wounds on her tongue and did not urinate on herself. She has a wound on her head, endorses headache. Denies vision changes, nausea, or vomiting. She is unsure of last tetanus. She is not anticoagulated. Reports that she does not frequently drink alcohol, reports she has not had a drink in over 2 months. Denies ever having any chest pain or shortness of breath, leg pain, or leg swelling.   The history is provided by the patient. No language interpreter was used.  Fall Associated symptoms include headaches.       Prior to Admission medications   Medication Sig Start Date End Date Taking? Authorizing Provider  amLODipine  (NORVASC ) 5 MG tablet TAKE 1 TABLET DAILY 04/17/24   Webb, Padonda B, FNP  atorvastatin  (LIPITOR) 40 MG tablet Take 1 tablet (40 mg total) by mouth daily. 03/13/24   Wendolyn Jenkins Jansky, MD  azelastine  (ASTELIN ) 0.1 % nasal spray Place 2 sprays into both nostrils 2 (two) times daily. Use in each nostril as directed 01/14/22   Jeneal Danita Macintosh, MD  Blood Pressure KIT 1 each by Does not apply route once a week. 05/23/23   Wendolyn Jenkins Jansky, MD  buPROPion  (WELLBUTRIN  XL) 150 MG 24 hr tablet TAKE 1 TABLET DAILY 12/28/23   Wendolyn Jenkins Jansky, MD  cephALEXin  (KEFLEX ) 500 MG capsule Take 1 capsule (500 mg  total) by mouth 2 (two) times daily. 04/20/24   Webb, Padonda B, FNP  cyclobenzaprine  (FLEXERIL ) 10 MG tablet Take 1 tablet (10 mg total) by mouth 2 (two) times daily as needed for muscle spasms. 03/06/24   Jerrol Agent, MD  Doxepin  HCl 3 MG TABS Take 1 tablet (3 mg total) by mouth at bedtime as needed. 03/03/24   Wendolyn Jenkins Jansky, MD  esomeprazole  (NEXIUM ) 40 MG capsule Take 1 capsule (40 mg total) by mouth daily. 12/11/23   Wendolyn Jenkins Jansky, MD  fluticasone  (FLONASE ) 50 MCG/ACT nasal spray Place 2 sprays into both nostrils daily. 04/06/23   Soldatova, Li[place], MD  furosemide  (LASIX ) 20 MG tablet TAKE 1 TABLET TWICE A DAY 01/25/24   Wendolyn Jenkins Jansky, MD  Humidifiers MISC Use as needed to prevent dry mouth 03/17/22   Kennyth Worth HERO, MD  lidocaine  (LIDODERM ) 5 % Place 1 patch onto the skin daily. Remove & Discard patch within 12 hours or as directed by MD 03/06/24   Jerrol Agent, MD  potassium chloride  SA (KLOR-CON  M20) 20 MEQ tablet Take 1 tablet (20 mEq total) by mouth 2 (two) times daily. 12/11/23   Wendolyn Jenkins Jansky, MD  sertraline  (ZOLOFT ) 50 MG tablet TAKE 1 AND 1/2 TABLETS AT  BEDTIME 12/28/23   Wendolyn Jenkins Jansky, MD  traZODone  (DESYREL ) 100 MG tablet Take 1 tablet (100 mg total) by mouth at bedtime as needed for  sleep. Please note, did larger dose so only 1 tab 12/11/23   Wendolyn Jenkins Jansky, MD    Allergies: Gabapentin , Lisinopril , Losartan , Pseudoephedrine-naproxen na er, and Lyrica [pregabalin]    Review of Systems  Skin:  Positive for wound.  Neurological:  Positive for headaches.  All other systems reviewed and are negative.   Updated Vital Signs BP 98/63   Pulse 73   Temp 98 F (36.7 C) (Oral)   Resp 19   Ht 5' 1 (1.549 m)   Wt 54.4 kg   SpO2 99%   BMI 22.67 kg/m   Physical Exam Vitals and nursing note reviewed.  Constitutional:      General: She is not in acute distress.    Appearance: Normal appearance. She is normal weight. She is not ill-appearing, toxic-appearing or  diaphoretic.  HENT:     Head: Normocephalic and atraumatic.     Comments: No racoon eyes No battle sign  Left frontal scalp with 3 cm laceration noted and underlying hematoma. Bleeding controlled, no crepitus or deformity.  Eyes:     Extraocular Movements: Extraocular movements intact.     Pupils: Pupils are equal, round, and reactive to light.  Cardiovascular:     Rate and Rhythm: Normal rate.     Comments: No tenderness to palpation of the anterior chest wall Pulmonary:     Effort: Pulmonary effort is normal. No respiratory distress.  Abdominal:     Comments: No abdominal tenderness or bruising  Musculoskeletal:        General: Normal range of motion.     Cervical back: Normal and normal range of motion.     Thoracic back: Normal.     Lumbar back: Normal.     Comments: No midline tenderness, no stepoffs or deformity noted on palpation of cervical, thoracic, and lumbar spine  Skin:    General: Skin is warm and dry.  Neurological:     General: No focal deficit present.     Mental Status: She is alert and oriented to person, place, and time.  Psychiatric:        Mood and Affect: Mood normal.        Behavior: Behavior normal.     (all labs ordered are listed, but only abnormal results are displayed) Labs Reviewed  CBC WITH DIFFERENTIAL/PLATELET - Abnormal; Notable for the following components:      Result Value   WBC 13.0 (*)    RBC 3.71 (*)    Hemoglobin 11.7 (*)    HCT 35.6 (*)    Neutro Abs 10.0 (*)    All other components within normal limits  COMPREHENSIVE METABOLIC PANEL WITH GFR - Abnormal; Notable for the following components:   Potassium 3.2 (*)    Calcium  8.3 (*)    Total Protein 6.3 (*)    All other components within normal limits  ETHANOL - Abnormal; Notable for the following components:   Alcohol, Ethyl (B) 140 (*)    All other components within normal limits  URINALYSIS, ROUTINE W REFLEX MICROSCOPIC  RAPID URINE DRUG SCREEN, HOSP PERFORMED  CBG  MONITORING, ED  TROPONIN I (HIGH SENSITIVITY)    EKG: None  Radiology: CT Head Wo Contrast Result Date: 05/07/2024 CLINICAL DATA:  Head and neck trauma EXAM: CT HEAD WITHOUT CONTRAST CT CERVICAL SPINE WITHOUT CONTRAST TECHNIQUE: Multidetector CT imaging of the head and cervical spine was performed following the standard protocol without intravenous contrast. Multiplanar CT image reconstructions of the cervical spine were also generated.  RADIATION DOSE REDUCTION: This exam was performed according to the departmental dose-optimization program which includes automated exposure control, adjustment of the mA and/or kV according to patient size and/or use of iterative reconstruction technique. COMPARISON:  MRI brain 06/23/2023, MRI cervical spine 03/17/2023, radiograph 01/03/2024 FINDINGS: CT HEAD FINDINGS Brain: No acute territorial infarction, hemorrhage or intracranial mass. Moderate white matter hypodensity consistent with chronic small vessel ischemic change. Atrophy. Nonenlarged ventricles. Vascular: No hyperdense vessels.  Carotid vascular calcification Skull: Normal. Negative for fracture or focal lesion. Sinuses/Orbits: No acute finding. Other: Small left frontal scalp hematoma CT CERVICAL SPINE FINDINGS Alignment: Straightening of the cervical spine. Trace retrolisthesis C5 on C6. Facet alignment is normal Skull base and vertebrae: No acute fracture. No primary bone lesion or focal pathologic process. Soft tissues and spinal canal: No prevertebral fluid or swelling. No visible canal hematoma. Disc levels: Multilevel degenerative changes. Mild to moderate diffuse disc space narrowing C3 through C6. Posterior disc osteophyte at C3-C4 with mild mass effect on thecal sac. Multilevel foraminal narrowing with severe bilateral foraminal narrowing C4-C5 and C5-C6. Upper chest: Apical emphysema. Other: None IMPRESSION: 1. No CT evidence for acute intracranial abnormality. Atrophy and chronic small vessel  ischemic changes of the white matter. 2. Straightening of the cervical spine with degenerative changes. No acute osseous abnormality. 3. Emphysema Electronically Signed   By: Luke Bun M.D.   On: 05/07/2024 22:40   CT Cervical Spine Wo Contrast Result Date: 05/07/2024 CLINICAL DATA:  Head and neck trauma EXAM: CT HEAD WITHOUT CONTRAST CT CERVICAL SPINE WITHOUT CONTRAST TECHNIQUE: Multidetector CT imaging of the head and cervical spine was performed following the standard protocol without intravenous contrast. Multiplanar CT image reconstructions of the cervical spine were also generated. RADIATION DOSE REDUCTION: This exam was performed according to the departmental dose-optimization program which includes automated exposure control, adjustment of the mA and/or kV according to patient size and/or use of iterative reconstruction technique. COMPARISON:  MRI brain 06/23/2023, MRI cervical spine 03/17/2023, radiograph 01/03/2024 FINDINGS: CT HEAD FINDINGS Brain: No acute territorial infarction, hemorrhage or intracranial mass. Moderate white matter hypodensity consistent with chronic small vessel ischemic change. Atrophy. Nonenlarged ventricles. Vascular: No hyperdense vessels.  Carotid vascular calcification Skull: Normal. Negative for fracture or focal lesion. Sinuses/Orbits: No acute finding. Other: Small left frontal scalp hematoma CT CERVICAL SPINE FINDINGS Alignment: Straightening of the cervical spine. Trace retrolisthesis C5 on C6. Facet alignment is normal Skull base and vertebrae: No acute fracture. No primary bone lesion or focal pathologic process. Soft tissues and spinal canal: No prevertebral fluid or swelling. No visible canal hematoma. Disc levels: Multilevel degenerative changes. Mild to moderate diffuse disc space narrowing C3 through C6. Posterior disc osteophyte at C3-C4 with mild mass effect on thecal sac. Multilevel foraminal narrowing with severe bilateral foraminal narrowing C4-C5 and  C5-C6. Upper chest: Apical emphysema. Other: None IMPRESSION: 1. No CT evidence for acute intracranial abnormality. Atrophy and chronic small vessel ischemic changes of the white matter. 2. Straightening of the cervical spine with degenerative changes. No acute osseous abnormality. 3. Emphysema Electronically Signed   By: Luke Bun M.D.   On: 05/07/2024 22:40     .Laceration Repair  Date/Time: 05/08/2024 12:07 AM  Performed by: Tracey Lauraine LABOR, PA-C Authorized by: Tracey Lauraine LABOR, PA-C   Consent:    Consent obtained:  Verbal   Consent given by:  Patient   Risks, benefits, and alternatives were discussed: yes     Risks discussed:  Infection, pain, retained foreign body, tendon  damage, vascular damage, poor wound healing, poor cosmetic result, need for additional repair and nerve damage   Alternatives discussed:  No treatment, delayed treatment, observation and referral Universal protocol:    Procedure explained and questions answered to patient or proxy's satisfaction: yes     Imaging studies available: yes     Patient identity confirmed:  Verbally with patient Anesthesia:    Anesthesia method:  Local infiltration   Local anesthetic:  Lidocaine  2% WITH epi Laceration details:    Location:  Scalp   Scalp location:  Frontal   Length (cm):  3   Depth (mm):  2 Exploration:    Hemostasis achieved with:  Direct pressure   Imaging outcome: foreign body not noted     Wound exploration: wound explored through full range of motion and entire depth of wound visualized   Treatment:    Area cleansed with:  Saline   Amount of cleaning:  Standard   Irrigation solution:  Sterile saline   Irrigation volume:  500 ml   Irrigation method:  Pressure wash Skin repair:    Repair method:  Staples   Number of staples:  4 Approximation:    Approximation:  Close Repair type:    Repair type:  Simple Post-procedure details:    Dressing:  Open (no dressing)   Procedure completion:  Tolerated well,  no immediate complications    Medications Ordered in the ED  Tdap (ADACEL) injection 0.5 mL (0.5 mLs Intramuscular Not Given 05/07/24 2235)  lidocaine -EPINEPHrine (XYLOCAINE  W/EPI) 2 %-1:200000 (PF) injection 10 mL (10 mLs Infiltration Given by Other 05/07/24 2357)  morphine  (PF) 4 MG/ML injection 4 mg (4 mg Intravenous Given 05/07/24 2330)  potassium chloride  SA (KLOR-CON  M) CR tablet 40 mEq (40 mEq Oral Given 05/07/24 2347)                                    Medical Decision Making Amount and/or Complexity of Data Reviewed Labs: ordered. Radiology: ordered.  Risk Prescription drug management.   This patient is a 68 y.o. female who presents to the ED for concern of fall, this involves an extensive number of treatment options, and is a complaint that carries with it a high risk of complications and morbidity. The emergent differential diagnosis prior to evaluation includes, but is not limited to,  CVA, ACS, arrhythmia, vasovagal / orthostatic hypotension, sepsis, hypoglycemia, electrolyte disturbance, respiratory failure, anemia, dehydration, heat injury, polypharmacy, malignancy, anxiety/panic attack, substance abuse, intoxication  This is not an exhaustive differential.   Past Medical History / Co-morbidities / Social History:  has a past medical history of Anxiety disorder (09/09/2019), B12 deficiency, Cancer (HCC), Depression, Diverticulitis, Fatty liver, GERD (gastroesophageal reflux disease), Hyperlipidemia, Hypertension, Insomnia (09/09/2019), Liver cyst, and Vitamin D  deficiency.  Additional history: Chart reviewed.  Physical Exam: Physical exam performed. The pertinent findings include: Left frontal scalp with 3 cm laceration noted and underlying hematoma. Bleeding controlled, no crepitus or deformity.   Lab Tests: I ordered, and personally interpreted labs.  The pertinent results include:  WBC 13, hgb  11.7 (consistent with previous), K 3.2, troponin WNL, ethanol 140, UA  noninfectious   Imaging Studies: I ordered imaging studies including CT head, cervical spine. I independently visualized and interpreted imaging which showed   1. No CT evidence for acute intracranial abnormality. Atrophy and chronic small vessel ischemic changes of the white matter. 2. Straightening of the cervical spine with  degenerative changes. No acute osseous abnormality. 3. Emphysema   I agree with the radiologist interpretation.   Cardiac Monitoring:  The patient was maintained on a cardiac monitor.  My attending physician Dr. Griselda viewed and interpreted the cardiac monitored which showed an underlying rhythm of: sinus rhythm, no STEMI. I agree with this interpretation.   Medications: I ordered medication including morphine  for pain, oral potassium for hypokalemia, Tdap. Reevaluation of the patient after these medicines showed that the patient improved. I have reviewed the patients home medicines and have made adjustments as needed.   Disposition: After consideration of the diagnostic results and the patients response to treatment, I feel that emergency department workup does not suggest an emergent condition requiring admission or immediate intervention beyond what has been performed at this time. The plan is: discharge with close outpatient follow-up and return precautions. Patients work-up is benign, after above interventions she feels back to normal and ready to go home. She is able to walk to the bathroom unassisted. She has no signs or symptoms to suggest seizure. No signs or symptoms to suggest ACS/PE. Suspect patients symptoms were due to alcohol consumption. She is clinically sober to return home. Her scalp laceration repaired per above procedure which was well-tolerated.  Tdap updated.  Instructions to return in 7 to 10 days for staple removal recommended and discussed. Evaluation and diagnostic testing in the emergency department does not suggest an emergent condition requiring  admission or immediate intervention beyond what has been performed at this time.  Plan for discharge with close PCP follow-up.  Patient is understanding and amenable with plan, educated on red flag symptoms that would prompt immediate return.  Patient discharged in stable condition.   This is a shared visit with supervising physician Dr. Griselda who has independently evaluated patient & provided guidance in evaluation/management/disposition, in agreement with care    Final diagnoses:  Fall, initial encounter  Alcohol ingestion  Laceration of scalp without foreign body, initial encounter    ED Discharge Orders     None     An After Visit Summary was printed and given to the patient.      Tracey Lauraine DELENA DEVONNA 05/08/24 FONTAINE Griselda Norris, MD 05/13/24 2244

## 2024-05-07 NOTE — ED Notes (Signed)
 Patient transported to CT

## 2024-05-07 NOTE — ED Triage Notes (Addendum)
 Pt bibgcems form home. Pt had unwitnessed fall. Per ems 90 degree laceration on top of head. Pt does not remember anything after 7 pm today. Pt admits to alcohol this evening. No thinners   Bp 108/80 Hr 80 18 rr 96% ra  178 CBG

## 2024-05-07 NOTE — ED Notes (Signed)
Pt ambulated to the bathroom with no issues.

## 2024-05-07 NOTE — ED Provider Notes (Incomplete)
 [place] EMERGENCY DEPARTMENT AT [facility] Provider Note   CSN: [id number] Arrival date & time: 05/07/24  2114     Patient presents with: [name]   Tracey Saunders is a 68 y.o. female.  {Add pertinent medical, surgical, social history, OB history to YEP:67052} Patient with history of GERD, hypertension, hyperlipidemia presents today with complaints of fall. Reports that this evening she went on a date and had 2 bloody mary's and then her date brought her home and left. Patient states that she vaguely remembers changing clothes and then next thing she knew she was in the ambulance coming here. She reports that she lives alone, was told that her neighbor found her and called 911. She denies history of similar symptoms previously. She has no wounds on her tongue and did not urinate on herself. She has a wound on her head, endorses headache. Denies vision changes, nausea, or vomiting. She is unsure of last tetanus. She is not anticoagulated.      The history is provided by the patient. No language interpreter was used.  Fall Associated symptoms include headaches.       Prior to Admission medications   Medication Sig Start Date End Date Taking? Authorizing Provider  amLODipine  (NORVASC ) 5 MG tablet TAKE 1 TABLET DAILY 04/17/24   Webb, Padonda B, FNP  atorvastatin  (LIPITOR) 40 MG tablet Take 1 tablet (40 mg total) by mouth daily. 03/13/24   Wendolyn Jenkins Jansky, MD  azelastine  (ASTELIN ) 0.1 % nasal spray Place 2 sprays into both nostrils 2 (two) times daily. Use in each nostril as directed 01/14/22   Jeneal Danita Macintosh, MD  Blood Pressure KIT 1 each by Does not apply route once a week. 05/23/23   Wendolyn Jenkins Jansky, MD  buPROPion  (WELLBUTRIN  XL) 150 MG 24 hr tablet TAKE 1 TABLET DAILY 12/28/23   Wendolyn Jenkins Jansky, MD  cephALEXin  (KEFLEX ) 500 MG capsule Take 1 capsule (500 mg total) by mouth 2 (two) times daily. 04/20/24   Webb, Padonda B, FNP  cyclobenzaprine  (FLEXERIL ) 10 MG tablet  Take 1 tablet (10 mg total) by mouth 2 (two) times daily as needed for muscle spasms. 03/06/24   Jerrol Agent, MD  Doxepin  HCl 3 MG TABS Take 1 tablet (3 mg total) by mouth at bedtime as needed. 03/03/24   Wendolyn Jenkins Jansky, MD  esomeprazole  (NEXIUM ) 40 MG capsule Take 1 capsule (40 mg total) by mouth daily. 12/11/23   Wendolyn Jenkins Jansky, MD  fluticasone  (FLONASE ) 50 MCG/ACT nasal spray Place 2 sprays into both nostrils daily. 04/06/23   Soldatova, [place], MD  furosemide  (LASIX ) 20 MG tablet TAKE 1 TABLET TWICE A DAY 01/25/24   Wendolyn Jenkins Jansky, MD  Humidifiers MISC Use as needed to prevent dry mouth 03/17/22   Kennyth Worth HERO, MD  lidocaine  (LIDODERM ) 5 % Place 1 patch onto the skin daily. Remove & Discard patch within 12 hours or as directed by MD 03/06/24   Jerrol Agent, MD  potassium chloride  SA (KLOR-CON  M20) 20 MEQ tablet Take 1 tablet (20 mEq total) by mouth 2 (two) times daily. 12/11/23   Wendolyn Jenkins Jansky, MD  sertraline  (ZOLOFT ) 50 MG tablet TAKE 1 AND 1/2 TABLETS AT  BEDTIME 12/28/23   Wendolyn Jenkins Jansky, MD  traZODone  (DESYREL ) 100 MG tablet Take 1 tablet (100 mg total) by mouth at bedtime as needed for sleep. Please note, did larger dose so only 1 tab 12/11/23   Wendolyn Jenkins Jansky, MD    Allergies: Gabapentin ,  Lisinopril , Losartan , Pseudoephedrine-naproxen na er, and Lyrica [pregabalin]    Review of Systems  Skin:  Positive for wound.  Neurological:  Positive for headaches.  All other systems reviewed and are negative.   Updated Vital Signs BP 98/63   Pulse 73   Temp 98 F (36.7 C) (Oral)   Resp 19   Ht 5' 1 (1.549 m)   Wt 54.4 kg   SpO2 99%   BMI 22.67 kg/m   Physical Exam Vitals and nursing note reviewed.  Constitutional:      General: She is not in acute distress.    Appearance: Normal appearance. She is normal weight. She is not ill-appearing, toxic-appearing or diaphoretic.  HENT:     Head: Normocephalic and atraumatic.     Comments: No racoon eyes No battle sign  Left  frontal scalp with 2 cm laceration noted and underlying hematoma. Bleeding controlled, no crepitus or deformity.  Eyes:     Extraocular Movements: Extraocular movements intact.     Pupils: Pupils are equal, round, and reactive to light.  Cardiovascular:     Rate and Rhythm: Normal rate.     Comments: No tenderness to palpation of the anterior chest wall Pulmonary:     Effort: Pulmonary effort is normal. No respiratory distress.  Abdominal:     Comments: No abdominal tenderness or bruising  Musculoskeletal:        General: Normal range of motion.     Cervical back: Normal and normal range of motion.     Thoracic back: Normal.     Lumbar back: Normal.     Comments: No midline tenderness, no stepoffs or deformity noted on palpation of cervical, thoracic, and lumbar spine  Skin:    General: Skin is warm and dry.  Neurological:     General: No focal deficit present.     Mental Status: She is alert and oriented to person, place, and time.  Psychiatric:        Mood and Affect: Mood normal.        Behavior: Behavior normal.     (all labs ordered are listed, but only abnormal results are displayed) Labs Reviewed  CBC WITH DIFFERENTIAL/PLATELET - Abnormal; Notable for the following components:      Result Value   WBC 13.0 (*)    RBC 3.71 (*)    Hemoglobin 11.7 (*)    HCT 35.6 (*)    Neutro Abs 10.0 (*)    All other components within normal limits  COMPREHENSIVE METABOLIC PANEL WITH GFR - Abnormal; Notable for the following components:   Potassium 3.2 (*)    Calcium  8.3 (*)    Total Protein 6.3 (*)    All other components within normal limits  ETHANOL - Abnormal; Notable for the following components:   Alcohol, Ethyl (B) 140 (*)    All other components within normal limits  URINALYSIS, ROUTINE W REFLEX MICROSCOPIC  RAPID URINE DRUG SCREEN, HOSP PERFORMED  CBG MONITORING, ED  TROPONIN I (HIGH SENSITIVITY)    EKG: None  Radiology: CT Head Wo Contrast Result Date:  05/07/2024 CLINICAL DATA:  Head and neck trauma EXAM: CT HEAD WITHOUT CONTRAST CT CERVICAL SPINE WITHOUT CONTRAST TECHNIQUE: Multidetector CT imaging of the head and cervical spine was performed following the standard protocol without intravenous contrast. Multiplanar CT image reconstructions of the cervical spine were also generated. RADIATION DOSE REDUCTION: This exam was performed according to the departmental dose-optimization program which includes automated exposure control, adjustment of the mA  and/or kV according to patient size and/or use of iterative reconstruction technique. COMPARISON:  MRI brain 06/23/2023, MRI cervical spine 03/17/2023, radiograph 01/03/2024 FINDINGS: CT HEAD FINDINGS Brain: No acute territorial infarction, hemorrhage or intracranial mass. Moderate white matter hypodensity consistent with chronic small vessel ischemic change. Atrophy. Nonenlarged ventricles. Vascular: No hyperdense vessels.  Carotid vascular calcification Skull: Normal. Negative for fracture or focal lesion. Sinuses/Orbits: No acute finding. Other: Small left frontal scalp hematoma CT CERVICAL SPINE FINDINGS Alignment: Straightening of the cervical spine. Trace retrolisthesis C5 on C6. Facet alignment is normal Skull base and vertebrae: No acute fracture. No primary bone lesion or focal pathologic process. Soft tissues and spinal canal: No prevertebral fluid or swelling. No visible canal hematoma. Disc levels: Multilevel degenerative changes. Mild to moderate diffuse disc space narrowing C3 through C6. Posterior disc osteophyte at C3-C4 with mild mass effect on thecal sac. Multilevel foraminal narrowing with severe bilateral foraminal narrowing C4-C5 and C5-C6. Upper chest: Apical emphysema. Other: None IMPRESSION: 1. No CT evidence for acute intracranial abnormality. Atrophy and chronic small vessel ischemic changes of the white matter. 2. Straightening of the cervical spine with degenerative changes. No acute  osseous abnormality. 3. Emphysema Electronically Signed   By: Luke Bun M.D.   On: 05/07/2024 22:40   CT Cervical Spine Wo Contrast Result Date: 05/07/2024 CLINICAL DATA:  Head and neck trauma EXAM: CT HEAD WITHOUT CONTRAST CT CERVICAL SPINE WITHOUT CONTRAST TECHNIQUE: Multidetector CT imaging of the head and cervical spine was performed following the standard protocol without intravenous contrast. Multiplanar CT image reconstructions of the cervical spine were also generated. RADIATION DOSE REDUCTION: This exam was performed according to the departmental dose-optimization program which includes automated exposure control, adjustment of the mA and/or kV according to patient size and/or use of iterative reconstruction technique. COMPARISON:  MRI brain 06/23/2023, MRI cervical spine 03/17/2023, radiograph 01/03/2024 FINDINGS: CT HEAD FINDINGS Brain: No acute territorial infarction, hemorrhage or intracranial mass. Moderate white matter hypodensity consistent with chronic small vessel ischemic change. Atrophy. Nonenlarged ventricles. Vascular: No hyperdense vessels.  Carotid vascular calcification Skull: Normal. Negative for fracture or focal lesion. Sinuses/Orbits: No acute finding. Other: Small left frontal scalp hematoma CT CERVICAL SPINE FINDINGS Alignment: Straightening of the cervical spine. Trace retrolisthesis C5 on C6. Facet alignment is normal Skull base and vertebrae: No acute fracture. No primary bone lesion or focal pathologic process. Soft tissues and spinal canal: No prevertebral fluid or swelling. No visible canal hematoma. Disc levels: Multilevel degenerative changes. Mild to moderate diffuse disc space narrowing C3 through C6. Posterior disc osteophyte at C3-C4 with mild mass effect on thecal sac. Multilevel foraminal narrowing with severe bilateral foraminal narrowing C4-C5 and C5-C6. Upper chest: Apical emphysema. Other: None IMPRESSION: 1. No CT evidence for acute intracranial abnormality.  Atrophy and chronic small vessel ischemic changes of the white matter. 2. Straightening of the cervical spine with degenerative changes. No acute osseous abnormality. 3. Emphysema Electronically Signed   By: Luke Bun M.D.   On: 05/07/2024 22:40    {Document cardiac monitor, telemetry assessment procedure when appropriate:32947} Procedures   Medications Ordered in the ED  lidocaine -EPINEPHrine (XYLOCAINE  W/EPI) 2 %-1:200000 (PF) injection 10 mL (has no administration in time range)  Tdap (ADACEL) injection 0.5 mL (0.5 mLs Intramuscular Not Given 05/07/24 2235)  morphine  (PF) 4 MG/ML injection 4 mg (has no administration in time range)  potassium chloride  SA (KLOR-CON  M) CR tablet 40 mEq (has no administration in time range)      {Click here  for ABCD2, HEART and other calculators REFRESH Note before signing:1}                              Medical Decision Making Amount and/or Complexity of Data Reviewed Labs: ordered. Radiology: ordered.  Risk Prescription drug management.   This patient is a 68 y.o. female who presents to the ED for concern of ***, this involves an extensive number of treatment options, and is a complaint that carries with it a high risk of complications and morbidity. The emergent differential diagnosis prior to evaluation includes, but is not limited to,  *** . This is not an exhaustive differential.   Past Medical History / Co-morbidities / Social History: ***  Additional history: Chart reviewed. Pertinent results include: ***  Physical Exam: Physical exam performed. The pertinent findings include: ***  Lab Tests: I ordered, and personally interpreted labs.  The pertinent results include:  ***   Imaging Studies: I ordered imaging studies including ***. I independently visualized and interpreted imaging which showed ***. I agree with the radiologist interpretation.   Cardiac Monitoring:  The patient was maintained on a cardiac monitor.  My attending  physician Dr. PIERRETTE viewed and interpreted the cardiac monitored which showed an underlying rhythm of: ***. I agree with this interpretation.   Medications: I ordered medication including ***  for ***. Reevaluation of the patient after these medicines showed that the patient {resolved/improved/worsened:23923::improved}. I have reviewed the patients home medicines and have made adjustments as needed.  Consultations Obtained: I requested consultation with the ***,  and discussed lab and imaging findings as well as pertinent plan - they recommend: ***   Disposition: After consideration of the diagnostic results and the patients response to treatment, I feel that *** .   ***emergency department workup does not suggest an emergent condition requiring admission or immediate intervention beyond what has been performed at this time. The plan is: ***. The patient is safe for discharge and has been instructed to return immediately for worsening symptoms, change in symptoms or any other concerns.  I discussed this case with my attending physician Dr. PIERRETTE who cosigned this note including patient's presenting symptoms, physical exam, and planned diagnostics and interventions. Attending physician stated agreement with plan or made changes to plan which were implemented.     {Document critical care time when appropriate  Document review of labs and clinical decision tools ie CHADS2VASC2, etc  Document your independent review of radiology images and any outside records  Document your discussion with family members, caretakers and with consultants  Document social determinants of health affecting pt's care  Document your decision making why or why not admission, treatments were needed:32947:::1}   Final diagnoses:  None    ED Discharge Orders     None

## 2024-05-07 NOTE — ED Notes (Signed)
 Pt complaining of 10/10 head pain, MD notified.

## 2024-05-08 ENCOUNTER — Ambulatory Visit

## 2024-05-08 LAB — URINALYSIS, ROUTINE W REFLEX MICROSCOPIC
Bilirubin Urine: NEGATIVE
Glucose, UA: NEGATIVE mg/dL
Ketones, ur: NEGATIVE mg/dL
Leukocytes,Ua: NEGATIVE
Nitrite: NEGATIVE
Protein, ur: NEGATIVE mg/dL
Specific Gravity, Urine: 1.006 (ref 1.005–1.030)
pH: 6 (ref 5.0–8.0)

## 2024-05-08 LAB — RAPID URINE DRUG SCREEN, HOSP PERFORMED
Amphetamines: NOT DETECTED
Barbiturates: NOT DETECTED
Benzodiazepines: NOT DETECTED
Cocaine: NOT DETECTED
Opiates: POSITIVE — AB
Tetrahydrocannabinol: NOT DETECTED

## 2024-05-08 NOTE — Discharge Instructions (Signed)
 You were seen in the emergency department for your fall and scalp laceration.  Your workup was overall reassuring for acute findings.  Given you feel better, no further evaluation is indicated.  We have closed your laceration(s) with staples. These need to be removed in 7-10 days. This can be done at any doctor's office, urgent care, or emergency department.   If any of the staples come out before it is time for removal, that is okay. Make sure to keep the area as clean and dry as possible. You can let warm soapy water run over the area, but do NOT scrub it.   Watch out for signs of infection, like we discussed, including: increased redness, tenderness, or drainage of pus from the area. If this happens and you have not been prescribed an antibiotic, please seek medical attention for possible infection.   You can take over the counter pain medicine like ibuprofen or tylenol  as needed.   Please call your PCP to schedule a close follow-up appointment.  Return if development of any new or worsening symptoms.

## 2024-05-08 NOTE — ED Notes (Signed)
Patient verbalizes understanding of discharge instructions. Opportunity for questioning and answers were provided. Armband removed by staff, pt discharged from ED. Ambulated out to lobby, awaiting ride home

## 2024-05-08 NOTE — ED Notes (Signed)
 Pt PIV removed by nursing staff

## 2024-05-10 ENCOUNTER — Telehealth: Payer: Self-pay

## 2024-05-10 NOTE — Telephone Encounter (Signed)
 Called pt she doesnt have any future blood work orders but does have an appt on 11-10 advised if blood work needed we would order then    Copied from [facility] #[id number]. Topic: General - Other >> May 10, 2024  2:46 PM [name] wrote: Reason for CRM: Patient called in wanted to know if it was okay if she get her labs down at [facility] clinic , patient would like a callback if that's okay

## 2024-05-13 ENCOUNTER — Encounter: Payer: Self-pay | Admitting: Radiology

## 2024-05-13 ENCOUNTER — Other Ambulatory Visit

## 2024-05-14 ENCOUNTER — Emergency Department (HOSPITAL_COMMUNITY)

## 2024-05-14 ENCOUNTER — Encounter (HOSPITAL_COMMUNITY): Payer: Self-pay

## 2024-05-14 ENCOUNTER — Emergency Department (HOSPITAL_COMMUNITY)
Admission: EM | Admit: 2024-05-14 | Discharge: 2024-05-14 | Disposition: A | Discharge status: Home / Self Care | Attending: Emergency Medicine | Admitting: Emergency Medicine

## 2024-05-14 ENCOUNTER — Other Ambulatory Visit: Payer: Self-pay

## 2024-05-14 DIAGNOSIS — Z4802 Encounter for removal of sutures: Secondary | ICD-10-CM | POA: Insufficient documentation

## 2024-05-14 DIAGNOSIS — I10 Essential (primary) hypertension: Secondary | ICD-10-CM | POA: Insufficient documentation

## 2024-05-14 DIAGNOSIS — Z043 Encounter for examination and observation following other accident: Secondary | ICD-10-CM | POA: Diagnosis not present

## 2024-05-14 DIAGNOSIS — Z79899 Other long term (current) drug therapy: Secondary | ICD-10-CM | POA: Insufficient documentation

## 2024-05-14 DIAGNOSIS — S61412D Laceration without foreign body of left hand, subsequent encounter: Secondary | ICD-10-CM | POA: Diagnosis not present

## 2024-05-14 DIAGNOSIS — M19042 Primary osteoarthritis, left hand: Secondary | ICD-10-CM | POA: Diagnosis not present

## 2024-05-14 DIAGNOSIS — M79642 Pain in left hand: Secondary | ICD-10-CM | POA: Insufficient documentation

## 2024-05-14 MED ORDER — PREDNISONE 20 MG PO TABS: 40.0000 mg | ORAL_TABLET | Freq: Every day | ORAL | 0 refills | Status: AC

## 2024-05-14 NOTE — Discharge Instructions (Addendum)
 You were seen in the ER today for evaluation of your staple removal and your left hand pain.  This is likely a flareup of your arthritis.  For this, I am prescribing you some prednisone  to take daily.  Please take as prescribed.  I have also included information for a hand specialist for you to follow-up with.  You can also call your orthopedic office to see one of their hand specialist as well.  For pain, recommend taking an 1000 mg of Tylenol  every 6 hours as needed for pain.  I have included additional information on hand pain and suture removal aftercare into the discharge paperwork.  Please review.  If you have any concerns, new or worsening symptoms, please return to the nearest emergency department for reevaluation.  Contact a health care provider if: Your pain does not get better after a few days. Your pain gets worse. Your pain affects your ability to do your daily activities. Your hand becomes warm, red, or swollen. Your hand is numb or tingling. Get help right away if: Your hand is extremely swollen or is an unusual shape. Your hand or fingers turn white or blue. You cannot move your hand, wrist, or fingers.

## 2024-05-14 NOTE — ED Notes (Signed)
 Patient left before receiving discharge education/paperwork.

## 2024-05-14 NOTE — ED Provider Notes (Signed)
 [place] EMERGENCY DEPARTMENT AT [facility] Provider Note   CSN: [id number] Arrival date & time: 05/14/24  0540     Patient presents with: Hand Injury and Suture / Staple Removal   Tracey Saunders is a 68 y.o. female with h/o arthritis, HTN presents to the ER today for evaluation of left hand pain and staple removal. On 05/07/24, the patient was seen in the emergency department after a fall. She reports that she has been having pain to the MCP of her third and fourth fingers on the left hand. Denies any numbness or tingling. She reports that the pain does improve with pain medication, but comes back. Denies any numbness or tingling. For her staples, she denies any bleeding or purulent discharge. No fevers. The patient does have some superficial abrasions to the hand that she reports she has been cleaning with hydrogen peroxide.  She also denies any bleeding or purulent discharge from these areas as well.  She does have a history of arthritis but usually does not have it in this portion of her hand.   Hand Injury Associated symptoms: no fever   Suture / Staple Removal       Prior to Admission medications   Medication Sig Start Date End Date Taking? Authorizing Provider  amLODipine  (NORVASC ) 5 MG tablet TAKE 1 TABLET DAILY 04/17/24   Webb, Padonda B, FNP  atorvastatin  (LIPITOR) 40 MG tablet Take 1 tablet (40 mg total) by mouth daily. 03/13/24   Wendolyn Jenkins Jansky, MD  azelastine  (ASTELIN ) 0.1 % nasal spray Place 2 sprays into both nostrils 2 (two) times daily. Use in each nostril as directed 01/14/22   Jeneal Danita Macintosh, MD  Blood Pressure KIT 1 each by Does not apply route once a week. 05/23/23   Wendolyn Jenkins Jansky, MD  buPROPion  (WELLBUTRIN  XL) 150 MG 24 hr tablet TAKE 1 TABLET DAILY 12/28/23   Wendolyn Jenkins Jansky, MD  cephALEXin  (KEFLEX ) 500 MG capsule Take 1 capsule (500 mg total) by mouth 2 (two) times daily. 04/20/24   Webb, Padonda B, FNP  cyclobenzaprine  (FLEXERIL ) 10 MG  tablet Take 1 tablet (10 mg total) by mouth 2 (two) times daily as needed for muscle spasms. 03/06/24   Jerrol Agent, MD  Doxepin  HCl 3 MG TABS Take 1 tablet (3 mg total) by mouth at bedtime as needed. 03/03/24   Wendolyn Jenkins Jansky, MD  esomeprazole  (NEXIUM ) 40 MG capsule Take 1 capsule (40 mg total) by mouth daily. 12/11/23   Wendolyn Jenkins Jansky, MD  fluticasone  (FLONASE ) 50 MCG/ACT nasal spray Place 2 sprays into both nostrils daily. 04/06/23   Soldatova, [place], MD  furosemide  (LASIX ) 20 MG tablet TAKE 1 TABLET TWICE A DAY 01/25/24   Wendolyn Jenkins Jansky, MD  Humidifiers MISC Use as needed to prevent dry mouth 03/17/22   Kennyth Worth HERO, MD  lidocaine  (LIDODERM ) 5 % Place 1 patch onto the skin daily. Remove & Discard patch within 12 hours or as directed by MD 03/06/24   Jerrol Agent, MD  potassium chloride  SA (KLOR-CON  M20) 20 MEQ tablet Take 1 tablet (20 mEq total) by mouth 2 (two) times daily. 12/11/23   Wendolyn Jenkins Jansky, MD  sertraline  (ZOLOFT ) 50 MG tablet TAKE 1 AND 1/2 TABLETS AT  BEDTIME 12/28/23   Wendolyn Jenkins Jansky, MD  traZODone  (DESYREL ) 100 MG tablet Take 1 tablet (100 mg total) by mouth at bedtime as needed for sleep. Please note, did larger dose so only 1 tab 12/11/23  Wendolyn Jenkins Jansky, MD    Allergies: Gabapentin , Lisinopril , Losartan , Pseudoephedrine-naproxen na er, and Lyrica [pregabalin]    Review of Systems  Constitutional:  Negative for chills and fever.  Musculoskeletal:  Positive for arthralgias.  Skin:  Positive for wound.  Neurological:  Negative for weakness and numbness.    Updated Vital Signs BP 115/64 (BP Location: Right Arm)   Pulse 68   Temp 98.2 F (36.8 C) (Oral)   Resp 16   Ht 5' 1 (1.549 m)   Wt 54.4 kg   SpO2 100%   BMI 22.66 kg/m   Physical Exam Vitals and nursing note reviewed.  Constitutional:      General: She is not in acute distress.    Appearance: She is not ill-appearing or toxic-appearing.  HENT:     Head:     Comments: Almost C-shaped laceration  present at the top of the scalp.  4 staples intact.  Some crusted blood but no fluctuance or induration.  No increase in warmth or erythema.  Nontender. Eyes:     General: No scleral icterus. Pulmonary:     Effort: Pulmonary effort is normal. No respiratory distress.  Musculoskeletal:       Hands:     Comments: Tenderness to the marked area above.  Some slight increase in warmth but no increase in erythema.  Few small scabbed superficial abrasions noted. No surrounding erythema, fluctuance, or induration. No red streaking. Brisk cap refill present in all 5 fingers.  2+ radial pulses.  Compartments are soft.  Does have some swelling to the marked area however is minimal.  Compartments are soft.  She cannot fully make a fist given the pain but is able to extend.  Hand is not held in a flexed position.  Sensation intact.  No other tenderness into the fingers or wrist or upper extremity.  Skin:    General: Skin is warm and dry.  Neurological:     Mental Status: She is alert.     (all labs ordered are listed, but only abnormal results are displayed) Labs Reviewed - No data to display  EKG: None  Radiology: DG Hand Complete Left Result Date: 05/14/2024 EXAM: 3 OR MORE VIEW(S) XRAY OF THE LEFT HAND 05/14/2024 06:28:00 AM COMPARISON: None available. CLINICAL HISTORY: fall FINDINGS: BONES AND JOINTS: No acute fracture. No focal osseous lesion. No joint dislocation. Mild degenerative changes in the STT and first CMC joints. SOFT TISSUES: The soft tissues are unremarkable. IMPRESSION: 1. No acute osseous abnormality. Electronically signed by: Waddell Calk MD 05/14/2024 07:34 AM EST RP Workstation: [id number]   Suture Removal  Date/Time: 05/14/2024 12:55 PM  Performed by: Bernis Ernst, PA-C Authorized by: Bernis Ernst, PA-C   Consent:    Consent obtained:  Verbal   Consent given by:  Patient   Risks, benefits, and alternatives were discussed: yes     Risks discussed:  Wound separation and  pain   Alternatives discussed:  No treatment Universal protocol:    Patient identity confirmed:  Verbally with patient Location:    Location:  Head/neck   Head/neck location:  Scalp Procedure details:    Wound appearance:  No signs of infection, good wound healing, clean and nontender   Number of staples removed:  4 Post-procedure details:    Procedure completion:  Tolerated well, no immediate complications    Medications Ordered in the ED - No data to display  Medical Decision Making Amount and/or Complexity of Data Reviewed Radiology: ordered.  Risk Prescription  drug management.   67 y.o. female presents to the ER today for evaluation of hand pain and staple removal. Differential diagnosis includes but is not limited to arthritis, gout, septic arthritis, cellulitis, fracture, staple removal. Vital signs unremarkable. Physical exam as noted above.   On previous chart evaluation, patient was seen in the ER on 10 - 28 - 25.  She had 4 staples placed.  The patient is within timeframe for staple removal.  Wound is well-appearing.  Discussed present benefits of staple removal patient agrees.  Please see procedure note. Wound is intact and well appearing.   XR imaging shows 1. No acute osseous abnormality. Per radiologist's interpretation.    For the patient's hand, she does have some minimal swelling present to the area marked above.  There are some tenderness as well.  Could be arthritis flare given her known history of arthritis in the hands.  Do not think anything infectious given the patient's not having any fever or any purulent drainage from the area.  There is no induration or fluctuance or red streaking.  She is neuro vastly intact distally.  Will treat the patient with prednisone  for presumed arthritis flareup.  I have also given her the information for hand surgeon for her to follow-up with.  Also recommended she can follow-up with a hand specialist at her orthopedic provider  office as well.  We discussed staple removal care and additional information was included.  She is stable for discharge home.  We discussed plan at bedside. We discussed strict return precautions and red flag symptoms. The patient verbalized their understanding and agrees to the plan. The patient is stable and being discharged home in good condition.  Portions of this report may have been transcribed using voice recognition software. Every effort was made to ensure accuracy; however, inadvertent computerized transcription errors may be present.    Final diagnoses:  Left hand pain  Encounter for staple removal    ED Discharge Orders          Ordered    predniSONE  (DELTASONE ) 20 MG tablet  Daily        05/14/24 1256               Bernis Ernst, [place] 05/14/24 1310    Franklyn Sid SAILOR, MD 05/14/24 1407

## 2024-05-14 NOTE — ED Triage Notes (Signed)
 Pt arrived via POV requesting that staples be removed from previous visit and c/o left hand pain 10/10 on pain scale with limited ROM unresolved from previous visit.

## 2024-05-14 NOTE — ED Notes (Signed)
 Pt to sort desk inquiring about wait time. Pt understands states she is stepping outside for a few minutes

## 2024-05-20 ENCOUNTER — Encounter: Admitting: Family Medicine

## 2024-06-24 ENCOUNTER — Telehealth: Payer: Self-pay | Admitting: Family Medicine

## 2024-06-24 NOTE — Telephone Encounter (Signed)
 Left patient vm to get scheduled for CPE before end of year.  Patient can be transferred through to office to get scheduled as this would need an override.

## 2024-07-12 ENCOUNTER — Other Ambulatory Visit (HOSPITAL_COMMUNITY): Payer: Self-pay

## 2024-07-17 ENCOUNTER — Ambulatory Visit: Admitting: Family

## 2024-07-23 ENCOUNTER — Encounter: Payer: Self-pay | Admitting: Family Medicine

## 2024-07-25 NOTE — Progress Notes (Signed)
 Tracey Saunders                                          MRN: [id number]   07/25/2024   The [facility]BCI Quality Team Specialist reviewed this patient medical record for the purposes of chart review for care gap closure. The following were reviewed: chart review for care gap closure-controlling blood pressure.    VBCI Quality Team

## 2024-07-26 ENCOUNTER — Other Ambulatory Visit: Payer: Self-pay | Admitting: Family Medicine

## 2024-07-31 ENCOUNTER — Ambulatory Visit: Payer: Self-pay | Admitting: Family Medicine

## 2024-07-31 ENCOUNTER — Ambulatory Visit: Admitting: Family Medicine

## 2024-07-31 ENCOUNTER — Encounter: Payer: Self-pay | Admitting: Family Medicine

## 2024-07-31 VITALS — BP 124/82 | HR 76 | Temp 97.3°F | Ht 61.0 in | Wt 123.2 lb

## 2024-07-31 DIAGNOSIS — R600 Localized edema: Secondary | ICD-10-CM

## 2024-07-31 DIAGNOSIS — F3342 Major depressive disorder, recurrent, in full remission: Secondary | ICD-10-CM | POA: Diagnosis not present

## 2024-07-31 DIAGNOSIS — K219 Gastro-esophageal reflux disease without esophagitis: Secondary | ICD-10-CM

## 2024-07-31 DIAGNOSIS — J40 Bronchitis, not specified as acute or chronic: Secondary | ICD-10-CM

## 2024-07-31 DIAGNOSIS — G8929 Other chronic pain: Secondary | ICD-10-CM

## 2024-07-31 DIAGNOSIS — M5441 Lumbago with sciatica, right side: Secondary | ICD-10-CM | POA: Diagnosis not present

## 2024-07-31 DIAGNOSIS — Z1159 Encounter for screening for other viral diseases: Secondary | ICD-10-CM | POA: Diagnosis not present

## 2024-07-31 DIAGNOSIS — E7800 Pure hypercholesterolemia, unspecified: Secondary | ICD-10-CM | POA: Diagnosis not present

## 2024-07-31 DIAGNOSIS — I10 Essential (primary) hypertension: Secondary | ICD-10-CM

## 2024-07-31 DIAGNOSIS — F411 Generalized anxiety disorder: Secondary | ICD-10-CM

## 2024-07-31 DIAGNOSIS — M7521 Bicipital tendinitis, right shoulder: Secondary | ICD-10-CM

## 2024-07-31 LAB — CBC WITH DIFFERENTIAL/PLATELET
Basophils Absolute: 0.1 K/uL (ref 0.0–0.1)
Basophils Relative: 0.9 % (ref 0.0–3.0)
Eosinophils Absolute: 0.1 K/uL (ref 0.0–0.7)
Eosinophils Relative: 1.7 % (ref 0.0–5.0)
HCT: 39.3 % (ref 36.0–46.0)
Hemoglobin: 13.2 g/dL (ref 12.0–15.0)
Lymphocytes Relative: 23.7 % (ref 12.0–46.0)
Lymphs Abs: 1.4 K/uL (ref 0.7–4.0)
MCHC: 33.5 g/dL (ref 30.0–36.0)
MCV: 92 fl (ref 78.0–100.0)
Monocytes Absolute: 0.5 K/uL (ref 0.1–1.0)
Monocytes Relative: 8 % (ref 3.0–12.0)
Neutro Abs: 3.8 K/uL (ref 1.4–7.7)
Neutrophils Relative %: 65.7 % (ref 43.0–77.0)
Platelets: 290 K/uL (ref 150.0–400.0)
RBC: 4.27 Mil/uL (ref 3.87–5.11)
RDW: 13.2 % (ref 11.5–15.5)
WBC: 5.8 K/uL (ref 4.0–10.5)

## 2024-07-31 LAB — COMPREHENSIVE METABOLIC PANEL WITH GFR
ALT: 16 U/L (ref 3–35)
AST: 19 U/L (ref 5–37)
Albumin: 4.3 g/dL (ref 3.5–5.2)
Alkaline Phosphatase: 68 U/L (ref 39–117)
BUN: 15 mg/dL (ref 6–23)
CO2: 28 meq/L (ref 19–32)
Calcium: 9.2 mg/dL (ref 8.4–10.5)
Chloride: 105 meq/L (ref 96–112)
Creatinine, Ser: 0.79 mg/dL (ref 0.40–1.20)
GFR: 75.91 mL/min
Glucose, Bld: 90 mg/dL (ref 70–99)
Potassium: 4.1 meq/L (ref 3.5–5.1)
Sodium: 141 meq/L (ref 135–145)
Total Bilirubin: 0.4 mg/dL (ref 0.2–1.2)
Total Protein: 6.6 g/dL (ref 6.0–8.3)

## 2024-07-31 LAB — MAGNESIUM: Magnesium: 1.9 mg/dL (ref 1.5–2.5)

## 2024-07-31 MED ORDER — ESOMEPRAZOLE MAGNESIUM 40 MG PO CPDR: 40.0000 mg | DELAYED_RELEASE_CAPSULE | Freq: Every day | ORAL | 1 refills | Status: AC

## 2024-07-31 MED ORDER — CEFDINIR 300 MG PO CAPS: 300.0000 mg | ORAL_CAPSULE | Freq: Two times a day (BID) | ORAL | 0 refills | Status: AC

## 2024-07-31 MED ORDER — SERTRALINE HCL 50 MG PO TABS: 150.0000 mg | ORAL_TABLET | Freq: Every day | ORAL | 1 refills | Status: AC

## 2024-07-31 MED ORDER — LIDOCAINE 5 % EX PTCH: 1.0000 | MEDICATED_PATCH | CUTANEOUS | 3 refills | Status: AC

## 2024-07-31 NOTE — Progress Notes (Signed)
 "  Subjective:     Patient ID: Tracey Saunders, female    DOB: Oct 01, 1955, 69 y.o.   MRN: [id number]  Chief Complaint  Patient presents with   Hypertension    Pt Is here to discuss medication    Discussed the use of AI scribe software for clinical note transcription with the patient, who gave verbal consent to proceed.  History of Present Illness Tracey Saunders is a 69 year old female who presents for f/u HTN,HLD,moods and chronic pain- with worsening arm, neck, and back pain.  She experiences a steady, throbbing pain in her arm, neck, and back. She has been avoiding further medical interventions such as shots or narcotics and has been using a weed pen for pain relief, taking two or three puffs when the pain becomes severe. She expresses frustration with the cycle of being referred between different specialists without receiving new solutions.  She continues to take her maintenance medications, including atorvastatin  40 mg daily for cholesterol, amlodipine  5 mg for blood pressure, furosemide  twice a day to prevent leg swelling, potassium twice a day, omeprazole  daily for stomach issues, Wellbutrin , and sertraline  for depression and anxiety. She has been taking sertraline  three times a day, although it was initially prescribed as one and a half times a day. She also takes trazodone  100 mg at bedtime for sleep.  She describes a recent 16-week period of illness where she was bedridden for three to four days at a time, experiencing severe coughing and sneezing, which she attributes to bronchitis. During this time, she barely ate and lost her voice. She used Primatene  tablets, which helped alleviate her symptoms, but she still has a slight cough. She mentions that she smoked less during this period and continues to smoke less than usual.  She uses Lidoderm  patches for her arm, neck, and back pain, which were initially provided by her sister. She is running low on these patches and is considering  obtaining more through a prescription or over-the-counter options.  No black stools or trouble swallowing. She has experienced a slight earache and a persistent cough. Her blood pressure occasionally runs high due to pain. She denies any thoughts of suicide and keeps her mind occupied with activities such as crocheting, sewing, cross-stitching, puzzles, and reading.    Health Maintenance Due  Topic Date Due   Hepatitis C Screening  Never done   Mammogram  Never done   Lung Cancer Screening  Never done   Bone Density Scan  Never done   Medicare Annual Wellness (AWV)  01/24/2024    Past Medical History:  Diagnosis Date   Anxiety disorder 09/09/2019   B12 deficiency    Cancer (HCC)    cervical cancer   Depression    Diverticulitis    Fatty liver    GERD (gastroesophageal reflux disease)    Hyperlipidemia    Hypertension    Insomnia 09/09/2019   Liver cyst    Vitamin D  deficiency     Past Surgical History:  Procedure Laterality Date   ABDOMINAL HYSTERECTOMY     COLON RESECTION     due to diverticulitis   SHOULDER ARTHROSCOPY WITH ROTATOR CUFF REPAIR Right 11/15/2022   Procedure: RIGHT SHOULDER ARTHROSCOPY WITH BICEPS TENODESIS;  Surgeon: Genelle Standing, MD;  Location: [facility];  Service: Orthopedics;  Laterality: Right;    Current Medications[1]  Allergies[2] ROS neg/noncontributory except as noted HPI/below      Objective:     BP 124/82 (BP Location:  Left Arm, Patient Position: Sitting, Cuff Size: Normal)   Pulse 76   Temp (!) 97.3 F (36.3 C) (Temporal)   Ht 5' 1 (1.549 m)   Wt 123 lb 4 oz (55.9 kg)   SpO2 98%   BMI 23.29 kg/m  Wt Readings from Last 3 Encounters:  07/31/24 123 lb 4 oz (55.9 kg)  05/14/24 119 lb 14.9 oz (54.4 kg)  05/07/24 120 lb (54.4 kg)    Physical Exam GENERAL: Well developed, well nourished, no acute distress. HEAD EYES EARS NOSE THROAT: Normocephalic, atraumatic, conjunctiva not injected, sclera nonicteric,  tympanic membranes unable to visualize due to cerumen, oropharynx clear, moist, without exudates. CARDIAC: Regular rate and rhythm, S1 S2 present, no murmur, dorsalis pedis 2 plus bilaterally, NECK: Supple, no thyromegaly, no nodes, no carotid bruits. LUNGS: Clear to auscultation bilaterally, no wheezes. ABDOMEN: Bowel sounds present, soft, mild tenderness in lower abdomen, non-distended, no hepatosplenomegaly, no masses. EXTREMITIES: No edema. MUSCULOSKELETAL: No gross abnormalities. Using cane NEUROLOGICAL: Alert and oriented x3, cranial nerves II through XII intact. PSYCHIATRIC: Normal mood, good eye contact.       Assessment & Plan:  Essential hypertension -     CBC with Differential/Platelet -     Comprehensive metabolic panel with GFR -     Magnesium   Pure hypercholesterolemia  Localized edema  Gastroesophageal reflux disease without esophagitis -     Esomeprazole  Magnesium ; Take 1 capsule (40 mg total) by mouth daily.  Dispense: 90 capsule; Refill: 1  Generalized anxiety disorder -     Sertraline  HCl; Take 3 tablets (150 mg total) by mouth daily.  Dispense: 270 tablet; Refill: 1  Recurrent major depressive disorder, in full remission  Biceps tendinitis of right shoulder -     Lidocaine ; Place 1 patch onto the skin daily. Remove & Discard patch within 12 hours or as directed by MD  Dispense: 30 patch; Refill: 3  Chronic right-sided low back pain with right-sided sciatica -     Lidocaine ; Place 1 patch onto the skin daily. Remove & Discard patch within 12 hours or as directed by MD  Dispense: 30 patch; Refill: 3  Bronchitis  Screening for viral disease -     Hepatitis C antibody  Other orders -     Cefdinir ; Take 1 capsule (300 mg total) by mouth 2 (two) times daily.  Dispense: 14 capsule; Refill: 0    Assessment and Plan Assessment & Plan Acute bronchitis with persistent cough   She has a persistent cough following a 16-week illness characterized by severe  coughing, sneezing, and laryngitis. Symptoms improved with over-the-counter medication, but the cough remains, possibly due to her smoking history. Prescribed Omnicef  to address potential bacterial infection and cover bladder issues.  Chronic right arm, neck, and back pain with biceps tendinitis and sciatica   She experiences chronic pain in the right arm, neck, and back, worsened by biceps tendinitis and sciatica. Prefers to avoid further injections, narcotics, and antibiotics. Manages pain with lidoderm  patches and has tried cannabis for relief. Sent prescription for lidoderm  patches to pharmacy and will continue current pain management strategies.  Essential hypertension  - chronic. controlled Her blood pressure is occasionally elevated due to arm and neck pain. It is currently managed with amlodipine  and furosemide . Continue amlodipine  5 mg daily and furosemide  twice daily.  Edema-chronic.  Controlled on lasix  bid.  Check lab  Pure hypercholesterolemia   Cholesterol is managed with atorvastatin  40 mg daily. Continue atorvastatin  40 mg daily.  Gastroesophageal reflux  disease   GERD is managed with omeprazole  daily. She has no current issues with food impaction, dysphagia, or melena. Dietary changes include increased vegetable intake and reduced meat consumption. Continue omeprazole  daily.  Major depressive disorder and generalized anxiety disorder   These conditions are managed with Wellbutrin  and sertraline . She takes sertraline  three times daily, which is higher than previously prescribed, and reports no suicidal ideation. Engages in activities to keep her mind occupied. Adjusted sertraline  prescription to reflect current dosing of 150 mg daily and continue Wellbutrin  as prescribed.     Return in about 3 months (around 10/29/2024) for chronic follow-up.  Jenkins CHRISTELLA Carrel, MD     [1]  Current Outpatient Medications:    amLODipine  (NORVASC ) 5 MG tablet, TAKE 1 TABLET DAILY, Disp: 90  tablet, Rfl: 3   atorvastatin  (LIPITOR) 40 MG tablet, Take 1 tablet (40 mg total) by mouth daily., Disp: 90 tablet, Rfl: 3   azelastine  (ASTELIN ) 0.1 % nasal spray, Place 2 sprays into both nostrils 2 (two) times daily. Use in each nostril as directed, Disp: 30 mL, Rfl: 5   Blood Pressure KIT, 1 each by Does not apply route once a week., Disp: 1 kit, Rfl: 0   buPROPion  (WELLBUTRIN  XL) 150 MG 24 hr tablet, TAKE 1 TABLET DAILY, Disp: 90 tablet, Rfl: 3   cefdinir  (OMNICEF ) 300 MG capsule, Take 1 capsule (300 mg total) by mouth 2 (two) times daily., Disp: 14 capsule, Rfl: 0   fluticasone  (FLONASE ) 50 MCG/ACT nasal spray, Place 2 sprays into both nostrils daily., Disp: 16 g, Rfl: 6   furosemide  (LASIX ) 20 MG tablet, TAKE 1 TABLET TWICE A DAY, Disp: 180 tablet, Rfl: 3   Humidifiers MISC, Use as needed to prevent dry mouth, Disp: 1 each, Rfl: 0   KLOR-CON  M20 20 MEQ tablet, TAKE 1 TABLET TWICE A DAY, Disp: 180 tablet, Rfl: 1   traZODone  (DESYREL ) 100 MG tablet, Take 1 tablet (100 mg total) by mouth at bedtime as needed for sleep. Please note, did larger dose so only 1 tab, Disp: 90 tablet, Rfl: 3   esomeprazole  (NEXIUM ) 40 MG capsule, Take 1 capsule (40 mg total) by mouth daily., Disp: 90 capsule, Rfl: 1   lidocaine  (LIDODERM ) 5 %, Place 1 patch onto the skin daily. Remove & Discard patch within 12 hours or as directed by MD, Disp: 30 patch, Rfl: 3   sertraline  (ZOLOFT ) 50 MG tablet, Take 3 tablets (150 mg total) by mouth daily., Disp: 270 tablet, Rfl: 1  Current Facility-Administered Medications:    diclofenac  Sodium (VOLTAREN ) 1 % topical gel 4 g, 4 g, Topical, QID, Corey, Evan S, MD [2]  Allergies Allergen Reactions   Gabapentin  Other (See Comments)    Messed w/my head   Lisinopril  Swelling   Losartan  Swelling   Pseudoephedrine-Naproxen Na Er     Other Reaction(s): Other (See Comments). Swelling reported in multiple joints   Lyrica [Pregabalin] Other (See Comments)    Dry mouth   "

## 2024-07-31 NOTE — Patient Instructions (Signed)

## 2024-07-31 NOTE — Progress Notes (Signed)
 Tracey Saunders                                          MRN: [id number]   07/31/2024   The VBCI Quality Team Specialist reviewed this patient medical record for the purposes of chart review for care gap closure. The following were reviewed: chart review for care gap closure-breast cancer screening and controlling blood pressure.    VBCI Quality Team

## 2024-07-31 NOTE — Progress Notes (Signed)
 Labs look great!

## 2024-08-01 LAB — HEPATITIS C ANTIBODY: Hepatitis C Ab: NONREACTIVE

## 2024-08-01 NOTE — Progress Notes (Signed)
 Called pt and notified.

## 2024-08-06 ENCOUNTER — Telehealth: Payer: Self-pay

## 2024-08-06 ENCOUNTER — Other Ambulatory Visit (HOSPITAL_COMMUNITY): Payer: Self-pay

## 2024-08-06 NOTE — Telephone Encounter (Signed)
 Pharmacy Patient Advocate Encounter   Received notification from [facility] KEY that prior authorization for Lidocaine  5% patches is required/requested.   Insurance verification completed.   The patient is insured through [facility].   Per test claim: PA required; PA submitted to above mentioned insurance via Latent Key/confirmation #/EOC [facility] Status is pending

## 2024-08-07 NOTE — Telephone Encounter (Signed)
 Pharmacy Patient Advocate Encounter  Received notification from [facility] that Prior Authorization for Lidocaine  5% patches  has been DENIED.  Full denial letter will be uploaded to the media tab. See denial reason below.   PA #/Case ID/Reference #: [id number]

## 2024-10-10 ENCOUNTER — Encounter: Admitting: Family Medicine
